# Patient Record
Sex: Female | Born: 1940 | Race: White | Hispanic: No | Marital: Married | Smoking: Never smoker
Health system: Southern US, Community
[De-identification: ages and names within clinical notes are randomized; demographics above are authoritative.]

## PROBLEM LIST (undated history)

## (undated) ENCOUNTER — Ambulatory Visit (INDEPENDENT_AMBULATORY_CARE_PROVIDER_SITE_OTHER): Admission: RE | Payer: Self-pay

## (undated) DIAGNOSIS — G629 Polyneuropathy, unspecified: Secondary | ICD-10-CM

## (undated) DIAGNOSIS — L309 Dermatitis, unspecified: Secondary | ICD-10-CM

## (undated) DIAGNOSIS — M4802 Spinal stenosis, cervical region: Secondary | ICD-10-CM

## (undated) DIAGNOSIS — E039 Hypothyroidism, unspecified: Secondary | ICD-10-CM

## (undated) DIAGNOSIS — H269 Unspecified cataract: Secondary | ICD-10-CM

## (undated) DIAGNOSIS — I10 Essential (primary) hypertension: Secondary | ICD-10-CM

## (undated) DIAGNOSIS — R011 Cardiac murmur, unspecified: Secondary | ICD-10-CM

## (undated) DIAGNOSIS — T148XXA Other injury of unspecified body region, initial encounter: Secondary | ICD-10-CM

## (undated) DIAGNOSIS — E785 Hyperlipidemia, unspecified: Secondary | ICD-10-CM

## (undated) DIAGNOSIS — M1990 Unspecified osteoarthritis, unspecified site: Secondary | ICD-10-CM

## (undated) DIAGNOSIS — IMO0002 Reserved for concepts with insufficient information to code with codable children: Secondary | ICD-10-CM

## (undated) DIAGNOSIS — F419 Anxiety disorder, unspecified: Secondary | ICD-10-CM

## (undated) DIAGNOSIS — R519 Headache, unspecified: Secondary | ICD-10-CM

## (undated) DIAGNOSIS — M4316 Spondylolisthesis, lumbar region: Secondary | ICD-10-CM

## (undated) DIAGNOSIS — D649 Anemia, unspecified: Secondary | ICD-10-CM

## (undated) HISTORY — DX: Hyperlipidemia, unspecified: E78.5

## (undated) HISTORY — DX: Essential (primary) hypertension: I10

## (undated) HISTORY — DX: Anxiety disorder, unspecified: F41.9

## (undated) HISTORY — DX: Reserved for concepts with insufficient information to code with codable children: IMO0002

## (undated) HISTORY — DX: Unspecified osteoarthritis, unspecified site: M19.90

## (undated) HISTORY — DX: Other injury of unspecified body region, initial encounter: T14.8XXA

## (undated) HISTORY — DX: Spinal stenosis, cervical region: M48.02

## (undated) HISTORY — DX: Unspecified cataract: H26.9

## (undated) HISTORY — PX: SPINE SURGERY: SHX786

## (undated) HISTORY — DX: Headache, unspecified: R51.9

## (undated) HISTORY — DX: Dermatitis, unspecified: L30.9

## (undated) HISTORY — DX: Hypothyroidism, unspecified: E03.9

## (undated) HISTORY — DX: Cardiac murmur, unspecified: R01.1

## (undated) HISTORY — DX: Polyneuropathy, unspecified: G62.9

## (undated) HISTORY — PX: BREAST SURGERY: SHX581

## (undated) HISTORY — PX: REPLACEMENT TOTAL KNEE BILATERAL: SUR1225

## (undated) HISTORY — PX: ABDOMINAL HYSTERECTOMY: SHX81

---

## 2004-02-21 ENCOUNTER — Ambulatory Visit
Admit: 2004-02-21 | Disposition: A | Discharge status: Home / Self Care | Payer: Self-pay | Source: Ambulatory Visit | Admitting: Physical Medicine & Rehabilitation

## 2004-02-24 ENCOUNTER — Ambulatory Visit
Admit: 2004-02-24 | Disposition: A | Discharge status: Home / Self Care | Payer: Self-pay | Source: Ambulatory Visit | Admitting: Physical Medicine & Rehabilitation

## 2010-09-15 LAB — TYPE AND SCREEN
AB Screen Gel: NEGATIVE
ABO Rh: A POS

## 2010-09-19 ENCOUNTER — Ambulatory Visit: Payer: Self-pay

## 2010-09-19 ENCOUNTER — Inpatient Hospital Stay
Admission: RE | Admit: 2010-09-19 | Disposition: A | Discharge status: Home Health | Payer: Self-pay | Source: Ambulatory Visit | Admitting: Orthopaedic Surgery

## 2010-09-19 LAB — CBC
Hematocrit: 33 % — ABNORMAL LOW (ref 37.0–47.0)
Hgb: 11.2 g/dL — ABNORMAL LOW (ref 12.0–16.0)
MCH: 30.4 pg (ref 28.0–32.0)
MCHC: 33.9 g/dL (ref 32.0–36.0)
MCV: 89.7 fL (ref 80.0–100.0)
MPV: 9.7 fL (ref 9.4–12.3)
Platelets: 199 10*3/uL (ref 140–400)
RBC: 3.68 10*6/uL — ABNORMAL LOW (ref 4.20–5.40)
RDW: 13 % (ref 12–15)
WBC: 13.71 10*3/uL — ABNORMAL HIGH (ref 3.50–10.80)

## 2010-09-20 LAB — CBC
Hematocrit: 30.6 % — ABNORMAL LOW (ref 37.0–47.0)
Hgb: 10.2 g/dL — ABNORMAL LOW (ref 12.0–16.0)
MCH: 29.7 pg (ref 28.0–32.0)
MCHC: 33.3 g/dL (ref 32.0–36.0)
MCV: 89 fL (ref 80.0–100.0)
MPV: 10.2 fL (ref 9.4–12.3)
Platelets: 242 10*3/uL (ref 140–400)
RBC: 3.44 10*6/uL — ABNORMAL LOW (ref 4.20–5.40)
RDW: 13 % (ref 12–15)
WBC: 11.68 10*3/uL — ABNORMAL HIGH (ref 3.50–10.80)

## 2010-09-20 LAB — BASIC METABOLIC PANEL
Anion Gap: 12 (ref 5.0–15.0)
BUN: 10 mg/dL (ref 7.0–19.0)
CO2: 20 mEq/L — ABNORMAL LOW (ref 22–29)
Calcium: 8 mg/dL — ABNORMAL LOW (ref 8.5–10.5)
Chloride: 104 mEq/L (ref 98–107)
Creatinine: 0.6 mg/dL (ref 0.6–1.0)
Glucose: 138 mg/dL — ABNORMAL HIGH (ref 70–100)
Potassium: 4.1 mEq/L (ref 3.5–5.1)
Sodium: 136 mEq/L (ref 136–145)

## 2010-09-20 LAB — GFR: EGFR: >60

## 2010-09-20 LAB — HEMOLYSIS INDEX: Hemolysis Index: 6 Index (ref 0–18)

## 2010-09-21 LAB — CBC
Hematocrit: 26.4 % — ABNORMAL LOW (ref 37.0–47.0)
Hgb: 9 g/dL — ABNORMAL LOW (ref 12.0–16.0)
MCH: 30.2 pg (ref 28.0–32.0)
MCHC: 34.1 g/dL (ref 32.0–36.0)
MCV: 88.6 fL (ref 80.0–100.0)
MPV: 9.9 fL (ref 9.4–12.3)
Platelets: 177 10*3/uL (ref 140–400)
RBC: 2.98 10*6/uL — ABNORMAL LOW (ref 4.20–5.40)
RDW: 14 % (ref 12–15)
WBC: 10.26 10*3/uL (ref 3.50–10.80)

## 2010-09-23 LAB — PREPARE RBC

## 2010-09-27 LAB — LAB USE ONLY - HISTORICAL SURGICAL PATHOLOGY

## 2010-09-29 ENCOUNTER — Inpatient Hospital Stay
Admission: EM | Admit: 2010-09-29 | Disposition: A | Discharge status: Rehabilitation Facility | Payer: Self-pay | Source: Emergency Department | Admitting: Surgical Critical Care

## 2010-09-29 LAB — BASIC METABOLIC PANEL
BUN: 13 mg/dL (ref 8–20)
CO2: 25 mEq/L (ref 21–30)
Calcium: 8.7 mg/dL (ref 8.6–10.2)
Chloride: 106 mEq/L (ref 98–107)
Creatinine: 0.5 mg/dL — ABNORMAL LOW (ref 0.6–1.5)
Glucose: 152 mg/dL — ABNORMAL HIGH (ref 70–100)
Potassium: 4.1 mEq/L (ref 3.6–5.0)
Sodium: 137 mEq/L (ref 136–146)

## 2010-09-29 LAB — CBC
Hematocrit: 29.1 % — ABNORMAL LOW (ref 37.0–47.0)
Hgb: 9.5 g/dL — ABNORMAL LOW (ref 12.0–16.0)
MCH: 30.3 pg (ref 28.0–32.0)
MCHC: 32.6 g/dL (ref 32.0–36.0)
MCV: 92.7 fL (ref 80.0–100.0)
MPV: 9.2 fL — ABNORMAL LOW (ref 9.4–12.3)
Platelets: 364 10*3/uL (ref 140–400)
RBC: 3.14 10*6/uL — ABNORMAL LOW (ref 4.20–5.40)
RDW: 16 % — ABNORMAL HIGH (ref 12–15)
WBC: 13.46 10*3/uL — ABNORMAL HIGH (ref 3.50–10.80)

## 2010-09-29 LAB — TYPE AND SCREEN
AB Screen Gel: NEGATIVE
ABO Rh: A POS

## 2010-09-29 LAB — ETHANOL

## 2010-09-29 LAB — PT AND APTT F/C
PT INR: 1 (ref 0.9–1.1)
PT: 13.7 s (ref 12.6–15.0)
PTT: 22 s — ABNORMAL LOW (ref 23–37)

## 2010-09-29 LAB — GFR: EGFR: >60

## 2010-09-30 LAB — CBC
Hematocrit: 28.1 % — ABNORMAL LOW (ref 37.0–47.0)
Hgb: 9 g/dL — ABNORMAL LOW (ref 12.0–16.0)
MCH: 29.9 pg (ref 28.0–32.0)
MCHC: 32 g/dL (ref 32.0–36.0)
MCV: 93.4 fL (ref 80.0–100.0)
MPV: 9.1 fL — ABNORMAL LOW (ref 9.4–12.3)
Platelets: 359 10*3/uL (ref 140–400)
RBC: 3.01 10*6/uL — ABNORMAL LOW (ref 4.20–5.40)
RDW: 16 % — ABNORMAL HIGH (ref 12–15)
WBC: 14.21 10*3/uL — ABNORMAL HIGH (ref 3.50–10.80)

## 2010-09-30 LAB — MAGNESIUM: Magnesium: 1.8 mg/dL (ref 1.6–2.3)

## 2010-09-30 LAB — BASIC METABOLIC PANEL
BUN: 12 mg/dL (ref 8–20)
CO2: 26 mEq/L (ref 21–30)
Calcium: 8 mg/dL — ABNORMAL LOW (ref 8.6–10.2)
Chloride: 109 mEq/L — ABNORMAL HIGH (ref 98–107)
Creatinine: 0.5 mg/dL — ABNORMAL LOW (ref 0.6–1.5)
Glucose: 100 mg/dL (ref 70–100)
Potassium: 3.7 mEq/L (ref 3.6–5.0)
Sodium: 140 mEq/L (ref 136–146)

## 2010-09-30 LAB — GFR: EGFR: >60

## 2010-10-02 LAB — BASIC METABOLIC PANEL
BUN: 9 mg/dL (ref 8–20)
CO2: 24 mEq/L (ref 21–30)
Calcium: 7.2 mg/dL — ABNORMAL LOW (ref 8.6–10.2)
Chloride: 111 mEq/L — ABNORMAL HIGH (ref 98–107)
Creatinine: 0.4 mg/dL — ABNORMAL LOW (ref 0.6–1.5)
Glucose: 90 mg/dL (ref 70–100)
Potassium: 3.1 mEq/L — ABNORMAL LOW (ref 3.6–5.0)
Sodium: 138 mEq/L (ref 136–146)

## 2010-10-02 LAB — CBC
Hematocrit: 24.8 % — ABNORMAL LOW (ref 37.0–47.0)
Hgb: 8.1 g/dL — ABNORMAL LOW (ref 12.0–16.0)
MCH: 30.3 pg (ref 28.0–32.0)
MCHC: 32.7 g/dL (ref 32.0–36.0)
MCV: 92.9 fL (ref 80.0–100.0)
MPV: 9.4 fL (ref 9.4–12.3)
Platelets: 257 10*3/uL (ref 140–400)
RBC: 2.67 10*6/uL — ABNORMAL LOW (ref 4.20–5.40)
RDW: 15 % (ref 12–15)
WBC: 8.38 10*3/uL (ref 3.50–10.80)

## 2010-10-02 LAB — GFR: EGFR: >60

## 2010-10-02 LAB — PHOSPHORUS: Phosphorus: 2.1 mg/dL — ABNORMAL LOW (ref 2.5–4.5)

## 2010-10-02 LAB — MAGNESIUM: Magnesium: 1.8 mg/dL (ref 1.6–2.3)

## 2010-10-03 LAB — BASIC METABOLIC PANEL
BUN: 6 mg/dL — ABNORMAL LOW (ref 8–20)
CO2: 22 mEq/L (ref 21–30)
Calcium: 7.7 mg/dL — ABNORMAL LOW (ref 8.6–10.2)
Chloride: 112 mEq/L — ABNORMAL HIGH (ref 98–107)
Creatinine: 0.4 mg/dL — ABNORMAL LOW (ref 0.6–1.5)
Glucose: 91 mg/dL (ref 70–100)
Potassium: 3.1 mEq/L — ABNORMAL LOW (ref 3.6–5.0)
Sodium: 139 mEq/L (ref 136–146)

## 2010-10-03 LAB — TYPE AND SCREEN
AB Screen Gel: NEGATIVE
ABO Rh: A POS

## 2010-10-03 LAB — GFR: EGFR: >60

## 2010-10-03 LAB — MAGNESIUM: Magnesium: 1.5 mg/dL — ABNORMAL LOW (ref 1.6–2.3)

## 2010-10-05 LAB — CBC
Hematocrit: 25.3 % — ABNORMAL LOW (ref 37.0–47.0)
Hgb: 8.3 g/dL — ABNORMAL LOW (ref 12.0–16.0)
MCH: 29.9 pg (ref 28.0–32.0)
MCHC: 32.8 g/dL (ref 32.0–36.0)
MCV: 91 fL (ref 80.0–100.0)
MPV: 9.3 fL — ABNORMAL LOW (ref 9.4–12.3)
Platelets: 304 10*3/uL (ref 140–400)
RBC: 2.78 10*6/uL — ABNORMAL LOW (ref 4.20–5.40)
RDW: 15 % (ref 12–15)
WBC: 8.74 10*3/uL (ref 3.50–10.80)

## 2010-10-05 LAB — GLUCOSE WHOLE BLOOD: Whole Blood Glucose: 113 mg/dL — ABNORMAL HIGH (ref 70–100)

## 2010-10-05 LAB — TOTAL HEMOGLOBIN GROUP
Hematocrit Total Calculated: 27.6 % — ABNORMAL LOW (ref 37.0–47.0)
Hemoglobin Total: 8.9 g/dL — ABNORMAL LOW (ref 12.0–16.0)

## 2010-10-05 LAB — ABG WITH NA/K/CA IONIZED
Arterial Total CO2: 22.8 mEq/L — ABNORMAL LOW (ref 24.0–30.0)
Base Excess, Arterial: -2.2 mEq/L — ABNORMAL LOW (ref <=2.0)
Calcium, Ionized: 2.41 mEq/L (ref 2.30–2.58)
HCO3, Arterial: 21.7 mEq/L — ABNORMAL LOW (ref 23.0–29.0)
O2 Sat, Arterial: 99.6 % (ref 95.0–100.0)
Temperature: 37
Whole Blood Potassium: 3.5 mEq/L (ref 3.5–5.3)
Whole Blood Sodium: 139 mEq/L (ref 136–146)
pCO2, Arterial: 36 mmHg (ref 35.0–45.0)
pH, Arterial: 7.398 (ref 7.350–7.450)
pO2, Arterial: 171 mmHg — ABNORMAL HIGH (ref 80.0–90.0)

## 2010-10-05 LAB — BASIC METABOLIC PANEL
BUN: 8 mg/dL (ref 8–20)
CO2: 23 mEq/L (ref 21–30)
Calcium: 7.7 mg/dL — ABNORMAL LOW (ref 8.6–10.2)
Chloride: 108 mEq/L — ABNORMAL HIGH (ref 98–107)
Creatinine: 0.4 mg/dL — ABNORMAL LOW (ref 0.6–1.5)
Glucose: 87 mg/dL (ref 70–100)
Potassium: 3.6 mEq/L (ref 3.6–5.0)
Sodium: 136 mEq/L (ref 136–146)

## 2010-10-05 LAB — CHLORIDE WHOLE BLOOD: Chloride, WB: 109 mEq/L — ABNORMAL HIGH (ref 98–106)

## 2010-10-05 LAB — MAGNESIUM: Magnesium: 1.7 mg/dL (ref 1.6–2.3)

## 2010-10-05 LAB — GFR: EGFR: >60

## 2010-10-06 LAB — CHLORIDE WHOLE BLOOD: Chloride, WB: 108 mEq/L — ABNORMAL HIGH (ref 98–106)

## 2010-10-06 LAB — CBC
Hematocrit: 24.6 % — ABNORMAL LOW (ref 37.0–47.0)
Hgb: 7.8 g/dL — ABNORMAL LOW (ref 12.0–16.0)
MCH: 29.3 pg (ref 28.0–32.0)
MCHC: 31.7 g/dL — ABNORMAL LOW (ref 32.0–36.0)
MCV: 92.5 fL (ref 80.0–100.0)
MPV: 9.6 fL (ref 9.4–12.3)
Platelets: 283 10*3/uL (ref 140–400)
RBC: 2.66 10*6/uL — ABNORMAL LOW (ref 4.20–5.40)
RDW: 16 % — ABNORMAL HIGH (ref 12–15)
WBC: 8.52 10*3/uL (ref 3.50–10.80)

## 2010-10-06 LAB — BLOOD GAS, ARTERIAL
Arterial Total CO2: 26.4 mEq/L (ref 24.0–30.0)
Base Excess, Arterial: 1.8 mEq/L (ref <=2.0)
FIO2: 40 %
HCO3, Arterial: 25.2 mEq/L (ref 23.0–29.0)
O2 Sat, Arterial: >100 % (ref 95.0–100.0)
Temperature: 37
pCO2, Arterial: 35.4 mmHg (ref 35.0–45.0)
pH, Arterial: 7.464 — ABNORMAL HIGH (ref 7.350–7.450)
pO2, Arterial: 190 mmHg — ABNORMAL HIGH (ref 80.0–90.0)

## 2010-10-06 LAB — BASIC METABOLIC PANEL
BUN: 11 mg/dL (ref 8–20)
CO2: 28 mEq/L (ref 21–30)
Calcium: 8 mg/dL — ABNORMAL LOW (ref 8.6–10.2)
Chloride: 107 mEq/L (ref 98–107)
Creatinine: 0.5 mg/dL — ABNORMAL LOW (ref 0.6–1.5)
Glucose: 96 mg/dL (ref 70–100)
Potassium: 3.6 mEq/L (ref 3.6–5.0)
Sodium: 137 mEq/L (ref 136–146)

## 2010-10-06 LAB — MAGNESIUM: Magnesium: 1.9 mg/dL (ref 1.6–2.3)

## 2010-10-06 LAB — PHOSPHORUS: Phosphorus: 2.4 mg/dL — ABNORMAL LOW (ref 2.5–4.5)

## 2010-10-07 LAB — CBC
Hematocrit: 25.3 % — ABNORMAL LOW (ref 37.0–47.0)
Hgb: 8 g/dL — ABNORMAL LOW (ref 12.0–16.0)
MCH: 29.7 pg (ref 28.0–32.0)
MCHC: 31.6 g/dL — ABNORMAL LOW (ref 32.0–36.0)
MCV: 94.1 fL (ref 80.0–100.0)
MPV: 9.5 fL (ref 9.4–12.3)
Platelets: 275 10*3/uL (ref 140–400)
RBC: 2.69 10*6/uL — ABNORMAL LOW (ref 4.20–5.40)
RDW: 16 % — ABNORMAL HIGH (ref 12–15)
WBC: 8.64 10*3/uL (ref 3.50–10.80)

## 2010-10-07 LAB — RED BLOOD CELLS OR HOLD

## 2010-10-07 LAB — MAGNESIUM: Magnesium: 1.7 mg/dL (ref 1.6–2.3)

## 2010-10-07 LAB — BASIC METABOLIC PANEL
BUN: 11 mg/dL (ref 8–20)
CO2: 29 mEq/L (ref 21–30)
Calcium: 7.8 mg/dL — ABNORMAL LOW (ref 8.6–10.2)
Chloride: 106 mEq/L (ref 98–107)
Creatinine: 0.4 mg/dL — ABNORMAL LOW (ref 0.6–1.5)
Glucose: 95 mg/dL (ref 70–100)
Potassium: 3.7 mEq/L (ref 3.6–5.0)
Sodium: 136 mEq/L (ref 136–146)

## 2010-10-07 LAB — PHOSPHORUS: Phosphorus: 2.9 mg/dL (ref 2.5–4.5)

## 2010-10-08 LAB — BASIC METABOLIC PANEL
BUN: 12 mg/dL (ref 8–20)
CO2: 32 mEq/L — ABNORMAL HIGH (ref 21–30)
Calcium: 8 mg/dL — ABNORMAL LOW (ref 8.6–10.2)
Chloride: 102 mEq/L (ref 98–107)
Creatinine: 0.5 mg/dL — ABNORMAL LOW (ref 0.6–1.5)
Glucose: 118 mg/dL — ABNORMAL HIGH (ref 70–100)
Potassium: 3.5 mEq/L — ABNORMAL LOW (ref 3.6–5.0)
Sodium: 134 mEq/L — ABNORMAL LOW (ref 136–146)

## 2010-10-08 LAB — GFR
EGFR: >60
EGFR: >60
EGFR: >60

## 2010-10-08 LAB — PREALBUMIN: Prealbumin: 9.7 mg/dL — ABNORMAL LOW (ref 19.9–41.9)

## 2010-10-08 LAB — LAB USE ONLY - HISTORICAL SURGICAL PATHOLOGY

## 2010-10-10 LAB — URINALYSIS, REFLEX TO MICROSCOPIC EXAM IF INDICATED
Bilirubin, UA: NEGATIVE
Glucose, UA: NEGATIVE
Ketones UA: NEGATIVE
Nitrite, UA: POSITIVE — AB
Protein, UR: 300 — AB
Specific Gravity UA POCT: 1.01 (ref 1.001–1.035)
Urine pH: 8 (ref 5.0–8.0)
Urobilinogen, UA: NORMAL mg/dL

## 2010-10-10 LAB — GFR: EGFR: >60

## 2010-10-10 LAB — BASIC METABOLIC PANEL
BUN: 16 mg/dL (ref 8–20)
CO2: 31 mEq/L — ABNORMAL HIGH (ref 21–30)
Calcium: 9.2 mg/dL (ref 8.6–10.2)
Chloride: 100 mEq/L (ref 98–107)
Creatinine: 0.5 mg/dL — ABNORMAL LOW (ref 0.6–1.5)
Glucose: 103 mg/dL — ABNORMAL HIGH (ref 70–100)
Potassium: 4.5 mEq/L (ref 3.6–5.0)
Sodium: 137 mEq/L (ref 136–146)

## 2010-10-10 LAB — CBC
Hematocrit: 30.9 % — ABNORMAL LOW (ref 37.0–47.0)
Hgb: 9.5 g/dL — ABNORMAL LOW (ref 12.0–16.0)
MCH: 28.5 pg (ref 28.0–32.0)
MCHC: 30.7 g/dL — ABNORMAL LOW (ref 32.0–36.0)
MCV: 92.8 fL (ref 80.0–100.0)
MPV: 9.8 fL (ref 9.4–12.3)
Platelets: 358 10*3/uL (ref 140–400)
RBC: 3.33 10*6/uL — ABNORMAL LOW (ref 4.20–5.40)
RDW: 15 % (ref 12–15)
WBC: 10.65 10*3/uL (ref 3.50–10.80)

## 2010-10-10 LAB — PHOSPHORUS: Phosphorus: 3.3 mg/dL (ref 2.5–4.5)

## 2010-10-10 LAB — MAGNESIUM: Magnesium: 1.8 mg/dL (ref 1.6–2.3)

## 2010-10-11 ENCOUNTER — Inpatient Hospital Stay
Admission: RE | Admit: 2010-10-11 | Disposition: A | Discharge status: Home / Self Care | Payer: Self-pay | Source: Other Acute Inpatient Hospital | Admitting: Physical Medicine & Rehabilitation

## 2010-10-11 LAB — URINALYSIS WITH MICROSCOPIC
Bilirubin, UA: NEGATIVE
Blood, UA: NEGATIVE
Glucose, UA: NEGATIVE
Ketones UA: NEGATIVE
Nitrite, UA: NEGATIVE
Protein, UR: NEGATIVE
Specific Gravity UA POCT: 1.01 (ref 1.001–1.035)
Urine pH: 8 (ref 5.0–8.0)
Urobilinogen, UA: NORMAL mg/dL

## 2010-10-12 LAB — CBC
Hematocrit: 26.1 % — ABNORMAL LOW (ref 37.0–47.0)
Hgb: 8.3 g/dL — ABNORMAL LOW (ref 12.0–16.0)
MCH: 29.4 pg (ref 28.0–32.0)
MCHC: 31.8 g/dL — ABNORMAL LOW (ref 32.0–36.0)
MCV: 92.6 fL (ref 80.0–100.0)
MPV: 9.2 fL — ABNORMAL LOW (ref 9.4–12.3)
Platelets: 294 10*3/uL (ref 140–400)
RBC: 2.82 10*6/uL — ABNORMAL LOW (ref 4.20–5.40)
RDW: 15 % (ref 12–15)
WBC: 7.48 10*3/uL (ref 3.50–10.80)

## 2010-10-12 LAB — COMPREHENSIVE METABOLIC PANEL
ALT: 25 U/L (ref 21–72)
AST (SGOT): 25 U/L (ref 8–39)
Albumin/Globulin Ratio: 0.9 — ABNORMAL LOW (ref 1.1–1.8)
Albumin: 2.5 g/dL — ABNORMAL LOW (ref 3.7–5.1)
Alkaline Phosphatase: 69 U/L (ref 43–122)
BUN: 12 mg/dL (ref 7–21)
Bilirubin, Total: 0.6 mg/dL (ref 0.2–1.3)
CO2: 27 mEq/L (ref 22–31)
Calcium: 8.2 mg/dL — ABNORMAL LOW (ref 8.6–10.2)
Chloride: 107 mEq/L (ref 98–107)
Creatinine: 0.5 mg/dL (ref 0.5–1.4)
Globulin: 2.8 g/dL (ref 2.0–3.7)
Glucose: 92 mg/dL (ref 70–100)
Potassium: 3.5 mEq/L — ABNORMAL LOW (ref 3.6–5.0)
Protein, Total: 5.3 g/dL — ABNORMAL LOW (ref 6.0–8.0)
Sodium: 138 mEq/L (ref 136–143)

## 2010-10-12 LAB — GFR: EGFR: >60

## 2010-10-15 LAB — GFR: EGFR: >60

## 2010-10-15 LAB — CBC
Hematocrit: 30.7 % — ABNORMAL LOW (ref 37.0–47.0)
Hgb: 9.6 g/dL — ABNORMAL LOW (ref 12.0–16.0)
MCH: 29.2 pg (ref 28.0–32.0)
MCHC: 31.3 g/dL — ABNORMAL LOW (ref 32.0–36.0)
MCV: 93.3 fL (ref 80.0–100.0)
MPV: 9.5 fL (ref 9.4–12.3)
Platelets: 329 10*3/uL (ref 140–400)
RBC: 3.29 10*6/uL — ABNORMAL LOW (ref 4.20–5.40)
RDW: 15 % (ref 12–15)
WBC: 8.84 10*3/uL (ref 3.50–10.80)

## 2010-10-15 LAB — BASIC METABOLIC PANEL
BUN: 12 mg/dL (ref 7–21)
CO2: 30 mEq/L (ref 22–31)
Calcium: 8.8 mg/dL (ref 8.6–10.2)
Chloride: 102 mEq/L (ref 98–107)
Creatinine: 0.6 mg/dL (ref 0.5–1.4)
Glucose: 91 mg/dL (ref 70–100)
Potassium: 3.3 mEq/L — ABNORMAL LOW (ref 3.6–5.0)
Sodium: 138 mEq/L (ref 136–143)

## 2010-10-20 LAB — URINALYSIS WITH MICROSCOPIC
Bilirubin, UA: NEGATIVE
Glucose, UA: NEGATIVE
Ketones UA: NEGATIVE
Nitrite, UA: NEGATIVE
Protein, UR: NEGATIVE
Specific Gravity UA POCT: 1.005 (ref 1.001–1.035)
Urine pH: 6 (ref 5.0–8.0)
Urobilinogen, UA: NORMAL mg/dL

## 2010-10-22 LAB — CBC
Hematocrit: 32.5 % — ABNORMAL LOW (ref 37.0–47.0)
Hgb: 10.2 g/dL — ABNORMAL LOW (ref 12.0–16.0)
MCH: 28.3 pg (ref 28.0–32.0)
MCHC: 31.4 g/dL — ABNORMAL LOW (ref 32.0–36.0)
MCV: 90.3 fL (ref 80.0–100.0)
MPV: 10 fL (ref 9.4–12.3)
Platelets: 317 10*3/uL (ref 140–400)
RBC: 3.6 10*6/uL — ABNORMAL LOW (ref 4.20–5.40)
RDW: 15 % (ref 12–15)
WBC: 6.59 10*3/uL (ref 3.50–10.80)

## 2010-10-22 LAB — BASIC METABOLIC PANEL
BUN: 11 mg/dL (ref 7–21)
CO2: 31 mEq/L (ref 22–31)
Calcium: 9.1 mg/dL (ref 8.6–10.2)
Chloride: 101 mEq/L (ref 98–107)
Creatinine: 0.6 mg/dL (ref 0.5–1.4)
Glucose: 99 mg/dL (ref 70–100)
Potassium: 3 mEq/L — ABNORMAL LOW (ref 3.6–5.0)
Sodium: 138 mEq/L (ref 136–143)

## 2010-10-22 LAB — GFR: EGFR: >60

## 2010-10-24 LAB — BASIC METABOLIC PANEL
BUN: 12 mg/dL (ref 7–21)
CO2: 27 mEq/L (ref 22–31)
Calcium: 9.3 mg/dL (ref 8.6–10.2)
Chloride: 104 mEq/L (ref 98–107)
Creatinine: 0.7 mg/dL (ref 0.5–1.4)
Glucose: 88 mg/dL (ref 70–100)
Potassium: 4.4 mEq/L (ref 3.6–5.0)
Sodium: 137 mEq/L (ref 136–143)

## 2010-10-24 LAB — GFR: EGFR: >60

## 2010-10-28 LAB — URINALYSIS, REFLEX TO MICROSCOPIC EXAM IF INDICATED
Bilirubin, UA: NEGATIVE
Blood, UA: NEGATIVE
Glucose, UA: NEGATIVE
Ketones UA: NEGATIVE
Nitrite, UA: NEGATIVE
Protein, UR: NEGATIVE
Specific Gravity UA POCT: 1.005 (ref 1.001–1.035)
Urine pH: 6 (ref 5.0–8.0)
Urobilinogen, UA: NORMAL mg/dL

## 2010-10-29 LAB — BASIC METABOLIC PANEL
BUN: 11 mg/dL (ref 7–21)
CO2: 26 mEq/L (ref 22–31)
Calcium: 8.6 mg/dL (ref 8.6–10.2)
Chloride: 99 mEq/L (ref 98–107)
Creatinine: 0.6 mg/dL (ref 0.5–1.4)
Glucose: 108 mg/dL — ABNORMAL HIGH (ref 70–100)
Potassium: 3 mEq/L — ABNORMAL LOW (ref 3.6–5.0)
Sodium: 131 mEq/L — ABNORMAL LOW (ref 136–143)

## 2010-10-29 LAB — CBC
Hematocrit: 33.3 % — ABNORMAL LOW (ref 37.0–47.0)
Hgb: 10.8 g/dL — ABNORMAL LOW (ref 12.0–16.0)
MCH: 28.2 pg (ref 28.0–32.0)
MCHC: 32.4 g/dL (ref 32.0–36.0)
MCV: 86.9 fL (ref 80.0–100.0)
MPV: 10.3 fL (ref 9.4–12.3)
Platelets: 152 10*3/uL (ref 140–400)
RBC: 3.83 10*6/uL — ABNORMAL LOW (ref 4.20–5.40)
RDW: 15 % (ref 12–15)
WBC: 5.28 10*3/uL (ref 3.50–10.80)

## 2010-10-29 LAB — GFR: EGFR: >60

## 2010-11-01 LAB — BASIC METABOLIC PANEL
BUN: 8 mg/dL (ref 7–21)
CO2: 25 mEq/L (ref 22–31)
Calcium: 8.9 mg/dL (ref 8.6–10.2)
Chloride: 107 mEq/L (ref 98–107)
Creatinine: 0.4 mg/dL — ABNORMAL LOW (ref 0.5–1.4)
Glucose: 86 mg/dL (ref 70–100)
Potassium: 3.8 mEq/L (ref 3.6–5.0)
Sodium: 139 mEq/L (ref 136–143)

## 2010-11-01 LAB — GFR: EGFR: >60

## 2010-11-05 LAB — CBC
Hematocrit: 33.4 % — ABNORMAL LOW (ref 37.0–47.0)
Hgb: 10.6 g/dL — ABNORMAL LOW (ref 12.0–16.0)
MCH: 27.8 pg — ABNORMAL LOW (ref 28.0–32.0)
MCHC: 31.7 g/dL — ABNORMAL LOW (ref 32.0–36.0)
MCV: 87.7 fL (ref 80.0–100.0)
MPV: 9.7 fL (ref 9.4–12.3)
Platelets: 257 10*3/uL (ref 140–400)
RBC: 3.81 10*6/uL — ABNORMAL LOW (ref 4.20–5.40)
RDW: 15 % (ref 12–15)
WBC: 5.34 10*3/uL (ref 3.50–10.80)

## 2010-11-05 LAB — GFR: EGFR: >60

## 2010-11-05 LAB — BASIC METABOLIC PANEL
BUN: 8 mg/dL (ref 7–21)
CO2: 28 mEq/L (ref 22–31)
Calcium: 9.2 mg/dL (ref 8.6–10.2)
Chloride: 104 mEq/L (ref 98–107)
Creatinine: 0.5 mg/dL (ref 0.5–1.4)
Glucose: 100 mg/dL (ref 70–100)
Potassium: 3.4 mEq/L — ABNORMAL LOW (ref 3.6–5.0)
Sodium: 137 mEq/L (ref 136–143)

## 2010-11-07 ENCOUNTER — Ambulatory Visit (INDEPENDENT_AMBULATORY_CARE_PROVIDER_SITE_OTHER)
Admit: 2010-11-07 | Disposition: A | Discharge status: Home / Self Care | Payer: Self-pay | Source: Ambulatory Visit | Admitting: Physical Medicine & Rehabilitation

## 2010-11-10 LAB — CBC
Hematocrit: 37.6 % (ref 37.0–47.0)
Hgb: 12.2 g/dL (ref 12.0–16.0)
MCH: 28 pg (ref 28.0–32.0)
MCHC: 32.4 g/dL (ref 32.0–36.0)
MCV: 86.4 fL (ref 80.0–100.0)
MPV: 10.1 fL (ref 9.4–12.3)
Platelets: 343 10*3/uL (ref 140–400)
RBC: 4.35 10*6/uL (ref 4.20–5.40)
RDW: 15 % (ref 12–15)
WBC: 8.19 10*3/uL (ref 3.50–10.80)

## 2010-11-10 LAB — BASIC METABOLIC PANEL
BUN: 8 mg/dL (ref 7–21)
CO2: 30 mEq/L (ref 22–31)
Calcium: 9.7 mg/dL (ref 8.6–10.2)
Chloride: 100 mEq/L (ref 98–107)
Creatinine: 0.6 mg/dL (ref 0.5–1.4)
Glucose: 123 mg/dL — ABNORMAL HIGH (ref 70–100)
Potassium: 3.3 mEq/L — ABNORMAL LOW (ref 3.6–5.0)
Sodium: 138 mEq/L (ref 136–143)

## 2010-11-10 LAB — GFR: EGFR: >60

## 2010-11-11 LAB — CBC
Hematocrit: 34.6 % — ABNORMAL LOW (ref 37.0–47.0)
Hgb: 11.1 g/dL — ABNORMAL LOW (ref 12.0–16.0)
MCH: 27.8 pg — ABNORMAL LOW (ref 28.0–32.0)
MCHC: 32.1 g/dL (ref 32.0–36.0)
MCV: 86.5 fL (ref 80.0–100.0)
MPV: 10 fL (ref 9.4–12.3)
Platelets: 296 10*3/uL (ref 140–400)
RBC: 4 10*6/uL — ABNORMAL LOW (ref 4.20–5.40)
RDW: 15 % (ref 12–15)
WBC: 5.48 10*3/uL (ref 3.50–10.80)

## 2010-11-11 LAB — BASIC METABOLIC PANEL
BUN: 8 mg/dL (ref 7–21)
CO2: 29 mEq/L (ref 22–31)
Calcium: 9.5 mg/dL (ref 8.6–10.2)
Chloride: 102 mEq/L (ref 98–107)
Creatinine: 0.5 mg/dL (ref 0.5–1.4)
Glucose: 97 mg/dL (ref 70–100)
Potassium: 4.2 mEq/L (ref 3.6–5.0)
Sodium: 137 mEq/L (ref 136–143)

## 2010-11-11 LAB — GFR: EGFR: >60

## 2010-11-11 LAB — TSH: TSH: 6.51 u[IU]/mL — ABNORMAL HIGH (ref 0.465–4.680)

## 2010-11-12 LAB — BASIC METABOLIC PANEL
BUN: 13 mg/dL (ref 7–21)
CO2: 27 mEq/L (ref 22–31)
Calcium: 9.3 mg/dL (ref 8.6–10.2)
Chloride: 104 mEq/L (ref 98–107)
Creatinine: 0.5 mg/dL (ref 0.5–1.4)
Glucose: 108 mg/dL — ABNORMAL HIGH (ref 70–100)
Potassium: 3.9 mEq/L (ref 3.6–5.0)
Sodium: 136 mEq/L (ref 136–143)

## 2010-11-12 LAB — CBC
Hematocrit: 34.2 % — ABNORMAL LOW (ref 37.0–47.0)
Hgb: 10.8 g/dL — ABNORMAL LOW (ref 12.0–16.0)
MCH: 27.4 pg — ABNORMAL LOW (ref 28.0–32.0)
MCHC: 31.6 g/dL — ABNORMAL LOW (ref 32.0–36.0)
MCV: 86.8 fL (ref 80.0–100.0)
MPV: 9.7 fL (ref 9.4–12.3)
Platelets: 291 10*3/uL (ref 140–400)
RBC: 3.94 10*6/uL — ABNORMAL LOW (ref 4.20–5.40)
RDW: 15 % (ref 12–15)
WBC: 6.64 10*3/uL (ref 3.50–10.80)

## 2010-11-12 LAB — TSH: TSH: 4.06 u[IU]/mL (ref 0.465–4.680)

## 2010-11-12 LAB — GFR: EGFR: >60

## 2010-11-13 LAB — BASIC METABOLIC PANEL
BUN: 9 mg/dL (ref 7–21)
CO2: 29 mEq/L (ref 22–31)
Calcium: 9.4 mg/dL (ref 8.6–10.2)
Chloride: 104 mEq/L (ref 98–107)
Creatinine: 0.5 mg/dL (ref 0.5–1.4)
Glucose: 99 mg/dL (ref 70–100)
Potassium: 4.1 mEq/L (ref 3.6–5.0)
Sodium: 138 mEq/L (ref 136–143)

## 2010-11-13 LAB — CBC
Hematocrit: 34.9 % — ABNORMAL LOW (ref 37.0–47.0)
Hgb: 11 g/dL — ABNORMAL LOW (ref 12.0–16.0)
MCH: 27.4 pg — ABNORMAL LOW (ref 28.0–32.0)
MCHC: 31.5 g/dL — ABNORMAL LOW (ref 32.0–36.0)
MCV: 86.8 fL (ref 80.0–100.0)
MPV: 10 fL (ref 9.4–12.3)
Platelets: 300 10*3/uL (ref 140–400)
RBC: 4.02 10*6/uL — ABNORMAL LOW (ref 4.20–5.40)
RDW: 15 % (ref 12–15)
WBC: 4.98 10*3/uL (ref 3.50–10.80)

## 2010-11-13 LAB — GFR: EGFR: >60

## 2010-11-14 LAB — GFR: EGFR: >60

## 2010-11-14 LAB — BASIC METABOLIC PANEL
BUN: 7 mg/dL (ref 7–21)
CO2: 24 mEq/L (ref 22–31)
Calcium: 9.4 mg/dL (ref 8.6–10.2)
Chloride: 106 mEq/L (ref 98–107)
Creatinine: 0.5 mg/dL (ref 0.5–1.4)
Glucose: 100 mg/dL (ref 70–100)
Potassium: 4.2 mEq/L (ref 3.6–5.0)
Sodium: 138 mEq/L (ref 136–143)

## 2010-12-05 ENCOUNTER — Ambulatory Visit (INDEPENDENT_AMBULATORY_CARE_PROVIDER_SITE_OTHER)
Admit: 2010-12-05 | Disposition: A | Discharge status: Home / Self Care | Payer: Self-pay | Source: Ambulatory Visit | Admitting: Physical Medicine & Rehabilitation

## 2011-02-06 ENCOUNTER — Ambulatory Visit (INDEPENDENT_AMBULATORY_CARE_PROVIDER_SITE_OTHER): Admit: 2011-02-06 | Disposition: A | Discharge status: Home / Self Care | Payer: Self-pay | Source: Ambulatory Visit

## 2011-02-13 ENCOUNTER — Ambulatory Visit
Admit: 2011-02-13 | Discharge: 2011-02-13 | Disposition: A | Discharge status: Home / Self Care | Payer: Self-pay | Source: Ambulatory Visit | Attending: Physical Medicine & Rehabilitation | Admitting: Physical Medicine & Rehabilitation

## 2011-05-14 ENCOUNTER — Ambulatory Visit
Admit: 2011-05-14 | Discharge: 2011-05-14 | Disposition: A | Discharge status: Home / Self Care | Payer: Self-pay | Source: Ambulatory Visit

## 2011-06-05 ENCOUNTER — Ambulatory Visit (INDEPENDENT_AMBULATORY_CARE_PROVIDER_SITE_OTHER)
Admit: 2011-06-05 | Discharge: 2011-06-05 | Disposition: A | Discharge status: Home / Self Care | Payer: Self-pay | Source: Ambulatory Visit

## 2011-07-11 LAB — ECG 12-LEAD
Atrial Rate: 70 {beats}/min
P Axis: 58 degrees
P-R Interval: 158 ms
Q-T Interval: 382 ms
QRS Duration: 82 ms
QTC Calculation (Bezet): 412 ms
R Axis: 35 degrees
T Axis: 12 degrees
Ventricular Rate: 70 {beats}/min

## 2011-08-09 NOTE — Op Note (Signed)
Darlene Landry, Darlene Landry      MRN:          [id number]      Account:      [id number]      Document ID:  [id number]      Procedure Date: 09/19/2010            Admit Date: 09/19/2010            Patient Location: [facility]20      Patient Type: I            SURGEON: Baker Janus MD            PREOPERATIVE DIAGNOSIS:      Severe degenerative arthritis, right knee.            POSTOPERATIVE DIAGNOSIS:      Severe degenerative arthritis right knee, 15 degrees genu valgus alignment      and 10-degree flexion contracture.            TITLE OF PROCEDURE:      Right total knee replacement            ASSISTANT:      Leanord Hawking, RNFA.            SECOND ASSISTANT:      Lenise Arena, RNFA.            COMPONENTS:      DePuy PFC Sigma size 3 right cruciate-retaining Porocoat femoral component      press-fit;  35-mm oval dome patellar 3-peg polyethylene, cemented;  size 2      modular fixed bearing cobalt chrome PFC Sigma tibial tray cemented;  10-mm      curved cross length tibial polyethylene insert.            ANESTHESIA:      Combined spinal and epidural with catheter            PREPARATION:      Alcohol and DuraPrep.            DESCRIPTION OF PROCEDURE:      After the satisfactory accomplishment of combined spinal and epidural with      catheter anesthesia, the patient was positioned supine and her right lower      extremity prepped and draped free in the usual sterile manner.  An Esmarch      wrap was tightly applied from toes to above the knee and the tourniquet at      the thigh inflated to 300 mmHg pressure.  A straight anterior longitudinal      midline incision was utilized with dissection carried sharply through the      skin and subcutaneous layers between the fibers of vastus medialis obliquus                                   Page 1 of 3      Darlene, Landry      MRN:          [id number]      Account:      [id number]      Document ID:  [id number]      Procedure Date: 09/19/2010            in the mid vastus zone and then  carried distally along the medial border of      the patella and patellar tendon.  The patella was dislocated  laterally but      not everted.  Thickness of the patella measured 21.5 mm.  Inspection of the      knee revealed severe tricompartmental degenerative arthritis.  There was      depression of the lateral tibial plateau and severe wear of the lateral      femoral condyle.  Hypertrophic osteophytic bone around the margins of the      femur and tibia was excised with rongeurs and osteotomes.  The anterior      cruciate ligament was quite atrophic its remnant was excised.  A femoral      notchplasty was performed.  A pilot hole was made in the trochlear region      of the femur to allow for insertion of an intramedullary alignment rod with      a 5-degree valgus distal femoral cutting guide.  Resections were made from      the distal femur medially and laterally, adequate in part to relieve the      degree flexion contracture.  Sizing of the femur then indicated that a size      3 component was ideal and a size 3 captured cutting guide was applied to      the cut surface of the distal femur to allow for anterior and posterior      condylar cuts and the chamfer cuts as well.            Attention was redirected to the tibia.  Stylus of the extramedullary tibial      guide was placed on the surface of the lateral tibial plateau and set at      zero.  The proximal tibial resection was performed after the alignment was      confirmed as ideal, and thus with a 5-degree posterior slope capture guide,      the proximal tibial resection was performed while the PCL was protected at      all times.  The menisci were resected.  The cut surface of the tibia was      covered perfectly with a size 2 tibial tray.  Trial reduction was performed      with a 10-mm curved tibial composite insert.  This is a fixed bearing      insert.  The knee came to full extension and was perfectly balanced to      varus and valgus stress  throughout flexion and extension.  Flexion and      extension gaps matched.  The thickness of the patella was measured and was      noted at 21.5 mm and the arthritic surface was resected to 14 mm.  The cut      surface of the patella was covered perfectly with a 35-mm oval patellar      template.  The holes for the 3 fixation pegs were drilled and a trial      patellar component applied.  There was lateral patellar tracking.  In      addition, the IT band was tight, creating a gapping of the medial      compartment.  An IT band fenestration was performed with a #15 blade      scalpel and a lateral retinacular release performed with the cautery.  This      completely corrected the imbalance of the IT band and medial ligaments,      restored the alignment to neutral, that is about 5 degrees of valgus and  with the patellar trial in place after the release, patellar tracking was      returned to normal as well.  The femoral lug holes were drilled using the      femoral trial as a template and the tibial metaphysis prepared with reamer      and broach for the stem and keel of the tibial component using the tibial      tray as a template.  Trial components were removed.  The knee was very      thoroughly lavaged with saline with a pulsatile lavage system and at the      same time, methyl methacrylate prepared in the [employer]ryker Revolution cement      mixing system.  At the proper viscosity, methyl methacrylate was applied to                                   Page 2 of 3      Darlene, Landry      MRN:          [id number]      Account:      [id number]      Document ID:  [id number]      Procedure Date: 09/19/2010            the cut surface of the tibia and manually compressed in the interstices of      the tibial surface.  Methyl methacrylate was also applied to the      undersurface of the tibial tray, keeping the stem bare.  The tibial      component was then impacted into place and excess cement expressed and       sharply trimmed from its margin.  The patellar component was cemented in a      similar manner.  At this time, at 57 minutes, the tourniquet was released      and circulation restored to the lower extremity.  The remainder of the      procedure was performed without tourniquet control.  The intramedullary      canal of the femur was plugged with autogenous bone graft prepared from the      distal femoral chamfer cuts.  The knee was brought into extension and      explored for bleeding.  There was no unusual bleeding encountered and thus      minimal use of cautery required.  The geniculate vessels were not bleeding.       After thorough lavage of the knee with the pulsatile system in extension,      the knee was brought into flexion.  The tibial tray was dried and then a      10-mm curved tibial poly insert, which was cross-linked and fixed bearing      was secured to the tibial tray in the usual manner.  Next, the fully      porous-coated size 3 right cruciate-retaining femoral component was      impacted into place with an excellent total contact press fit achieved.      Closure was performed in 120 degrees flexion.  Prior to closure, the      thickness of the patella was measured and recorded at 23 mm.  Closure was      performed in flexion with interrupted #1 Vicryl for the vastus medialis and      medial capsular repair, followed  by 2-0 Monocryl for the superficial      fascial and subcutaneous layers, and then a subcuticular closure for the      skin with 3-0 Monocryl and overlying and then a subcuticular closure for      the skin with 4-0 Monocryl and overlying Steri-Strips.  A standard dressing      was applied incorporating absorbent gauze, a thigh-high TED stocking, and      6-inch Ace wrap loosely applied from above the ankle to well above the      knee.  The patient was aroused from her sedation and transferred to the      postanesthesia care unit in satisfactory condition having tolerated the       procedure well and having received 2 grams of Ancef IV preoperatively.                        Electronic Signing Provider            D:  09/19/2010 15:07 PM by Dr. Baker Janus, MD [id number])      T:  09/19/2010 15:55 PM by [id number]                  cc:                                   Page 3 of 3      Authenticated and Edited by Baker Janus, MD [phone]) On 09/29/10 12:04:29 PM

## 2011-08-09 NOTE — Consults (Signed)
Darlene Landry, Darlene Landry      MRN:          [id number]      Account:      [id number]      Document ID:  [id number]      Service Date: 10/05/2010            Admit Date: 09/29/2010            Patient Location: [facility]04      Patient Type: I            CONSULTING PHYSICIAN: Henry Russel MD            REFERRING PHYSICIAN: Inda Merlin MD                  CONSULTING SERVICE:      Orthopedic surgery.            REASON FOR CONSULTATION:      Right knee pain.            CHIEF COMPLAINT:      Right knee pain.            HISTORY OF PRESENT ILLNESS:      The patient is a 70 year old female who complains of severe right knee pain      status post total knee arthroplasty on September 19, 2008 with Dr. Juliette Alcide.      She was using assistive devices at home.  She fell down some stairs, was      brought in as a trauma.  Orthopedic surgery was consulted after she      complains of severe right knee pain.  She was admitted to the trauma      service.  X-rays were obtained.  Pain has been since the time of injury.      No associated signs or symptoms such as numbness or tingling.  Pain has      been since the time of injury, not getting worse and relieved with      elevation and pain medicine.            PAST MEDICAL HISTORY:      Hyperlipidemia, hypertension, osteoarthritis, hypothyroidism.            PAST SURGICAL HISTORY:      Total knee arthroplasty September 19, 2010, partial hysterectomy In 1995,      breast surgery 1990.            SOCIAL HISTORY:      She lives with her husband, does not smoke, does not does drink, does not      do any drugs.            FAMILY HISTORY:      Reviewed, noncontributory.            REVIEW OF SYSTEMS:                                   Page 1 of 2      KARLE, DESROSIER      MRN:          [id number]      Account:      [id number]      Document ID:  [id number]      Service Date: 10/05/2010            Reviewed.  Please refer to the dictation by Dr. Luan Pulling October 03, 2010.             MEDICATIONS:      Reviewed.  Please refer to the dictation by Dr. Luan Pulling for December      14.            ALLERGIES:      NSAID, IODINE, TENORMAN, CALAN, DIOVAN, AVELOX, and PINEAPPLE.            PHYSICAL EXAMINATION:      VITAL SIGNS:  At the time of evaluation, temperature 99, heart rate 46,      respirations 13, blood pressure 144/56.      EXTREMITIES:  Her right knee indicates she has minimal effusion.  Her      incisions look fine.  No sign of infection, dehiscence from her trauma.      Her Steri-Strips were in place.  She can move her ankles and toes without      difficulty.  She has palpable pulses in her foot and normal sensation in      her foot.  She can dorsiflex and plantarflex her ankle without difficulty.      She has painful range of motion, but again no instability.            ASSESSMENT AND PLAN:      Right knee contusion status post total knee replacement.            RECOMMENDATIONS:      At this point is continue weightbearing as tolerated, range of motion as      tolerated.  Follow up with Dr. Juliette Alcide go upon discharge.            X-rays are negative for any periprosthetic fracture.                        Electronic Signing Provider            D:  10/05/2010 16:29 PM by Dr. Roberts [place]. [place] ([id number])      T:  10/05/2010 22:19 PM by [id number]                  cc:                                   Page 2 of 2      Authenticated by Genia Hotter, MD ([id number]) On 10/19/2010 11:37:12 AM

## 2011-08-09 NOTE — Consults (Signed)
Darlene Landry, Darlene Landry      MRN:          [id number]      Account:      [id number]      Document ID:  [id number]      Service Date: 10/12/2010            Admit Date: 10/11/2010            Patient Location: [facility]534-01      Patient Type: I            CONSULTING PHYSICIAN: Anice Paganini MD            REFERRING PHYSICIAN: Brooke Pace MD                  CONSULTING SERVICE:      Infectious disease.            REASON FOR CONSULTATION:      Dr. Nile Riggs has requested assistance in management of this patient with      Staphylococcal bacteremia, polymicrobial urinary tract infection.            HISTORY OF PRESENT ILLNESS:      This as a 70 year old female with a history of hyperlipidemia,      hypertension, osteoarthritis, hypothyroidism, history of partial      hysterectomy for fibroids, history of breast surgery in 1989 for a blocked      duct.            The patient has a history of degenerative arthritis, underwent a right      total knee replacement on September 19, 2010, at [facility].            She was discharged home, but unfortunately fell and injured her neck and      was admitted to [facility] on September 29, 2010.  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  She underwent anterior diskectomy and fusion from C4 to C5, open      reduction internal fixation of cervical facet as well as C4 to C5 posterior      spinal fusion.  She received perioperative Ancef.            The patient while at [facility] on October 10, 2010, had an      episode of encephalopathy, which was initially thought secondary to      morphine and Ativan.  She did have blood cultures obtained as well as a      urinalysis and urine culture.  She was empirically started on Cefepime when      the urinalysis was grossly abnormal.            The patient was subsequently  transferred to [facility] on October 11, 2010.  Blood cultures on both sets have      grown out after 24 hours a coagulase-negative Staphylococcus.  Infectious      disease has been requested to make further recommendations.                                         Page  1 of 3      Darlene Landry, Darlene Landry      MRN:          [id number]      Account:      [id number]      Document ID:  [id number]      Service Date: 10/12/2010            The patient's history was obtained through the staff, the chart, discussion      with Dr. Nile Riggs, review of [facility] records, as well as      discussion with the patient's husband.            The patient had a Foley catheter at [facility]; however, this      has been discontinued.  She did not have any central lines.  She is hard      stick.            According to the patient's husband, there have been no fevers, rigors, or      chills.  Her mental status has essentially resolved.            REVIEW OF SYSTEMS:      There is no dysuria, rigors, chills, headache, nausea, cough, diarrhea,      vomiting.            PAST MEDICAL AND PAST SURGICAL HISTORY:      As noted above.            ALLERGIES:      She has an allergy listed to AVELOX.            MEDICATIONS:      Current antibiotics include Cefepime.  Vancomycin has been ordered but not      given.            SOCIAL HISTORY:      The patient is married.  She does not smoke.  She does not drink.            FAMILY HISTORY:      Noncontributory.            PHYSICAL EXAMINATION:      VITAL SIGNS:  T-max of 99.2 degrees, current temperature of 97.7, heart      rate 70, respiratory rate of 18, blood pressure 124/76, saturation of 96%.      She is nontoxic and in no obvious distress.      HEENT AND NECK:  The patient has a cervical collar.  Her visible      postoperative surgical site looks benign.  Oral cavity, oropharynx is      otherwise clear.  She had anicteric sclerae.      CHEST:  Clear  to auscultation in all lung fields.      HEART:  Revealed a normal S1, S2, regular rate and rhythm.      ABDOMEN:  Soft and nontender.      EXTREMITIES:  Her right knee incision site is clean, dry, and intact.  No      evidence of infection.  There are Steri-Strips.  They are coapted.      NEUROLOGIC:  She is alert and oriented x3.                                         Page 2 of 3  Darlene Landry, Darlene Landry      MRN:          [id number]      Account:      [id number]      Document ID:  [id number]      Service Date: 10/12/2010            LABORATORY DATA:      White count of 7.48, hematocrit 26, platelets of 294.  BUN is 12,      creatinine 0.5.  Liver enzymes unremarkable.  Chest x-ray is negative.  CAT      scan of cervical spine revealed postoperative changes.  Head CT negative.      Dopplers negative.  Repeat urinalysis negative.  Blood cultures in 2 sets      of coagulase-negative Staphylococcus.  Urine culture:  E. coli and      ampicillin susceptible Enterococcus greater than 100,000.  Prior urinalysis      revealed too numerous to count red cells and white cells.            PICC line placed October 12, 2010.            ASSESSMENT:      1.  The patient is status post cervical fusion after neck injury, etcetera      at [facility].      2.  Positive blood cultures which are likely double contaminants.      3.  Escherichia coli and ampicillin susceptible Enterococcus UTI.            RECOMMENDATIONS:      1.  Will discontinue vancomycin order and discontinue Cefepime.      2.  Will start IV ampicillin with likely transition to oral amoxicillin in      the next 24 to 48 hours once taking oral better.      3.  Further recommendations depend on patient's clinical course.            Thank you for allowing me to participate in the patient's care.                        Electronic Signing Provider            D:  10/13/2010 14:30 PM by Dr. Ladell Heads. Basilia Stuckert, MD [id number])      T:  10/14/2010 15:28 PM by [id number]                   cc:                                   Page 3 of 3      Authenticated and Edited by Ladell Heads. Josearmando Kuhnert, MD [phone]) On 10/15/10 11:44:52 AM

## 2011-08-09 NOTE — Consults (Signed)
Darlene Landry, Darlene Landry      MRN:          [id number]      Account:      [id number]      Document ID:  [id number]      Service Date: 10/03/2010            Admit Date: 09/29/2010            Patient Location: [facility]      Patient Type: I            CONSULTING PHYSICIAN: Leonie Green Landry            REFERRING PHYSICIAN: Inda Merlin Landry                  CONSULTING SERVICE:      Physical Medicine and Rehabilitation.            CHIEF COMPLAINT:      Bilateral upper extremity weakness.            HISTORY OF PRESENT ILLNESS:      The patient is a very pleasant 70 year old female with severe right knee      osteoarthritis who had a right total knee arthroplasty performed on      September 19, 2010.  The patient tolerated the surgical procedure well and      was discharged to home and was participating in her outpatient therapies.      The patient was making significant progress in terms of her mobility and      was able to climb stairs independently with assistive devices at home.  On      September 29, 2010, the patient sustained a fall down the stairs at her      home.  Emergency medical services were activated and the patient was      transported to [facility].  Initial evaluation revealed      multiple injuries to include C4-C5 widening, fracture of right C4      transverse process, grade 1 retrolisthesis of C4 on C5.  Multiple      ligamentous injuries were identified on MRI and as well as a cervical cord      contusion.  The patient was found to have severe bilateral      right-greater-than-left upper extremity weakness and some mild numbness and      tingling in the bilateral upper and lower limbs.  The patient was admitted      to the trauma ICU where she was evaluated by neurosurgery.  She was placed      in an Aspen collar.  She is pending a cervical fusion which is planned for      October 04, 2010.  At the present time, the patient is complaining of 5/10      neck pain, as well as bilateral upper  extremity weakness.  She notes that      she has not had a bowel movement since her injury.  She has no other acute      complaints at the present time.            PAST SURGICAL HISTORY:  Hyperlipidemia, hypertension, osteoarthritis,      hypothyroidism, right total knee arthroplasty, September 19, 2010, partial      hysterectomy in 1995, breast surgery in 1990.            SOCIAL AND FUNCTIONAL HISTORY:      The patient  lives with her husband in [place], [place], in a multi-story                                   Page 1 of 4      Darlene Landry      MRN:          [id number]      Account:      [id number]      Document ID:  [id number]      Service Date: 10/03/2010            home with 4 steps to enter.  First floor setup would be possible.  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  She      has been retired for 6 years.  She denies tobacco, alcohol, or illicit drug      abuse.            FAMILY HISTORY:      Noncontributory.            REVIEW OF SYSTEMS:      A full multisystem review of systems was obtained and all responses were      negative with the exception of those listed above in the history of present      illness.            MEDICATIONS:      1.  Flexeril 10 mg p.o. q.8 h.      2.  Colace 100 mg p.o. q.12 h.      3.  Zetia 10 mg p.o. nightly.      4.  Lasix 20 mg p.o. daily.      5.  Synthroid 50 mcg p.o. daily.      6.  Paxil 10 mg p.o. nightly.      7.  Lyrica 150 mg p.o. q.12 h.      8.  Senokot one tablet p.o. q.12 h.      9.  Zocor 20 mg p.o. nightly.      10.  Tylenol p.r.n.      11.  Intravenous Dilaudid p.r.n.      12.  Morphine p.r.n.      13.  Percocet p.r.n.            ALLERGIES:      NSAIDs, IODINE, TENORMIN, CALAN, DIOVAN, AVELOX, PINEAPPLE.            WEIGHTBEARING STATUS:  There are no nonweightbearing restrictions.            CURRENT FUNCTIONAL STATUS:      The patient is currently on bedrest in an Aspen collar due to her cervical      fractures.   As a result, she is primarily dependent with most of her      mobility and functional activities.            PHYSICAL EXAMINATION:      VITAL SIGNS:  Temperature 99.0, heart 46, respirations 13, blood pressure      144/56.      GENERAL:  The patient is awake, alert, and oriented.  She is in no acute      distress.  She is pleasant and cooperative with examination.      HEENT:  Pupils are equal, round, reactive to light.  Extraocular muscles      are intact.  Oropharynx is clear.  Tongue is midline.  Cervical collar is                                   Page 2 of 4      Landry, GAETA      MRN:          [id number]      Account:      [id number]      Document ID:  [id number]      Service Date: 10/03/2010            in place.      CARDIOVASCULAR:  Regular rate and rhythm, no murmurs, rubs, or gallops.      PULMONARY:  Clear to auscultation; no rales, rhonchi, or wheezes.      ABDOMEN:  Soft, nontender, nondistended with normoactive bowel sounds.      EXTREMITIES:  There is mild bilateral lower extremity edema.  There is no      focal calf tenderness.  The right lower limb is in a CPM machine.      NEUROLOGIC:  Manual muscle testing reveals the following muscle strengths:      Left upper limb:  Elbow flexion 4/5, elbow extension 3/5, wrist extension      3/5, finger flexion 4/5, finger abduction 3/5.  The left lower limb:  Hip      flexion 4/5, knee extension 4/5, dorsiflexion 5/5, EHL 5/5, plantar flexion      5/5.  Right upper limb:  Elbow flexion 3/5, elbow extension 1/5, wrist      extension 1/5, finger flexion 1/5, finger abduction 0/5.  Right lower limb:       Not tested due to recent TKA and CPM machine.  Deep tendon reflexes are      hypoactive in the bilateral lower limbs.  There is negative Hoffman sign      bilaterally.  There is no clonus.  There is intact sensation to light touch      throughout the bilateral upper and lower limbs.            RECENT LABORATORY STUDIES:      On October 02, 2010, white  count 8.38, hemoglobin 8.1, hematocrit 24.8,      platelets 257.  Sodium 138, potassium 3.1, chloride 111, CO2 24, BUN 9,      creatinine 0.4, glucose 90, calcium 7.2, magnesium 1.8, phos 2.1.            RADIOLOGY STUDIES:      1.  CT of the head on September 29, 2010, was negative.      2.  CT angiogram of the chest on September 29, 2010, was negative.      3.  CT of the abdomen and pelvis on September 29, 2010, was negative.      4.  CT of the cervical spine on September 29, 2010, showed fracture through      the right transverse process of C4 with widening of the C4-C5 facet with      retrolisthesis of C4 on C5.      5.  CT of the thoracic spine on September 29, 2010, was negative.      6.  CT of the lumbar spine on September 29, 2010, was negative.      7.  CT angiogram of the neck on October 01, 2010, showed no great vessel  injury.      8.  MRI of the cervical spine on September 29, 2010, showed similar changes      to the CT scan as well as disruption of the ligamentum flavum, fracture of      the C4-C5 disc, disruption of the anterior and posterior longitudinal      ligaments, abnormal cord signal suggestive of contusion.            IMPRESSION:      The patient is a very pleasant 70 year old female with a recent right total      knee arthroplasty who was injured in a fall down stairs on September 29, 2010.  She sustained injuries to include cervical spine fractures.  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                                         Page 3 of 4      TAELER, WINNING      MRN:          [id number]      Account:      [id number]      Document ID:  [id number]      Service Date: 10/03/2010            RECOMMENDATIONS:      1.  The patient is currently in a cervical collar and on spine precautions.      2.  Surgical fusion and fixation is planned for October 04, 2010.      3.  Following medical and surgical  stabilization, given the patient's      premorbid functional level as well as her prognosis for improvement, the      patient will be an excellent candidate for acute inpatient rehabilitation.      4.  Recommend early involvement of physical therapy and occupational      therapy for mobility and functional activities.      5.  Frequent turns and skin checks to prevent pressure sores.      6.  The patient has a Foley catheter for bladder management.  In the future      we will need to discontinue the Foley to evaluate the patient's bladder      function.      7.  If there are no contraindications from the surgical team, I would      consider Lovenox for deep venous thrombosis prophylaxis in this high-risk      patient.      8.  The patient's pain appears to be adequately controlled at the present      time on current medications.      9.  We will continue to follow along with this patient and make      recommendations regarding disposition and management.            Thank you very much for allowing [place] to participate in the care of this      interesting patient.  Please do not hesitate to call with any questions or      concerns.  D:  10/03/2010 13:38 PM by Dr. Leonie Green, Landry ([id number])      T:  10/03/2010 14:24 PM by Jonathon Bellows                  cc:                                   Page 4 of 4      Authenticated by Leonie Green, Landry ([id number]) On 10/04/2010 08:22:40 AM

## 2011-08-09 NOTE — Consults (Signed)
Darlene Landry, Darlene Landry      MRN:          [id number]      Account:      [id number]      Document ID:  [id number]      Service Date: 10/10/2010            Admit Date: 09/29/2010            Patient Location: [facility]      Patient Type: I            CONSULTING PHYSICIAN: Trilby Drummer MD            REFERRING PHYSICIAN:                  REASON FOR CONSULTATION:      This is a consult from trauma team for right-sided weakness.            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  She was admitted on September 29, 2010, and had fallen down 10 stairs.  She had C4 to C5 cord contusion and      cord compression and underwent anterior cervical diskectomy with fusion on      October 04, 2010, with posterior fusion on October 05, 2010.  She had      quadriparesis, right greater than left, with weakness of 1/5 grip strength      on the right, as per the chart.  She was noted to be mildly confused today.       Her husband states that since last night she has been speaking      incoherently.  She thinks she is walking on water.  She has difficulty      moving the right arm and leg and complains of knee pain on the right.  Her      speech was apparently mildly slurred, as per the husband, but has improved      and she is able to articulate well without any facial drooping or any      asymmetry.  She is oriented at this time.  She denies any headaches.  She      has not been on steroids.  She has been started on Lyrica 150 mg twice a      day since admission.  She does have a UTI.            ALLERGIES:      Include NSAIDS, IODINE, TENORMIN, CALAN, DIOVAN, AVELOX.            PAST MEDICAL HISTORY:      Hypertension, hyperlipidemia, osteoporosis, hypothyroidism.            PAST  SURGICAL HISTORY:      Total knee replacement on November 2011, hysterectomy and breast surgery.            DIAGNOSTIC REVIEW:      I reviewed her records including her labs and notes.  Her CBC showed      hematocrit of 30.9.  Her Chem-7 was normal.  Her urinalysis was positive      for UTI.  Her cervical spine films were reviewed and  shows adequate                                   Page 1 of 3      Darlene Landry, Darlene Landry      MRN:          [id number]      Account:      [id number]      Document ID:  [id number]      Service Date: 10/10/2010            decompression and C4-C5, evidence of cord contusion on MRI cervical spine      images.  Her CT brain shows an old small left cerebellar lacune, but no      other acute findings.            SOCIAL HISTORY:      She does not smoke or drink.  She is married.  Her husband was present at      bedside.            FAMILY HISTORY:      Negative for any similar neurologic problems.            REVIEW OF SYSTEMS:      Positive for pain in the right knee.  She denied any fever, night sweats,      chills, sinus condition, hoarseness, visual disturbances, cough, shortness      of breath, chest pain, palpitations, diarrhea, constipation, incontinence,      diarrhea, constipation, muscle pain, muscle cramps, back pain, hives, rash,      bleeding, tremors, problems swallowing, depression, or anxiety.  She does      have incontinence.  All other review of systems negative.            PHYSICAL EXAMINATION:      VITAL SIGNS:  Blood pressure was 140s/60s, heart rate was 80, temperature      was 98.6,  respiratory rate was 20.      GENERAL:  She was in no apparent distress.      HEENT:  There was no pallor, icterus.      EXTREMITIES:  There was no pedal edema.      LUNGS:  Clear to auscultation.      HEART:  Sounds were S1, S2 normal.  There was no carotid bruit.  She did      have right greater than left leg and arm swelling.      NEUROLOGIC:  Revealed minimal dysarthria and right greater than  left      quadriparesis. She was alert and oriented x3.  She knew her name.  She knew      the location.  She knew the year.  She was off by 2 days on the date.  Her      pupils were equal, round, reactive to light.  Her visual fields were full.      Her extraocular movements were intact.  Face was symmetric.  Rest of her      cranial nerve exam was normal.  Strength was at least a 3/5 in the left      upper extremity and left grip strength and 1/5 in the right grip strength      in upper extremity and 2/5 in the right lower extremity and 2+/5 in the      left lower extremity and 4/5 in the left plantar  and ankle dorsiflexor and      2+/5 in the right plantar and ankle dorsiflexor.  Coordination and gait      could not be tested.  Sensory exam revealed diminished pinprick up to the      cervical level.  There was no atrophy or fasciculation.  Reflexes were 1 in      the biceps, brachioradialis, and 2+ in the triceps and 2 in bilateral knees      with upgoing plantars bilaterally.  There was no atrophy or fasciculations.            IMPRESSION AND PLAN:      A 70 year old woman with hypertension, hyperlipidemia,                                   Page 2 of 3      Darlene Landry, Darlene Landry      MRN:          [id number]      Account:      [id number]      Document ID:  [id number]      Service Date: 10/10/2010            hypothyroidism, osteoporosis with recent cord compression from cervical      trauma status post cervical spine surgery with persisting right-sided      weakness and persisting right greater than left-sided weakness and      quadriparesis and encephalopathy from possible underlying urinary tract      infection and medication related effects.  I recommended decreasing Lyrica      to 75 mg twice a day, and treating any underlying UTI, DVTs, and      recommended an MRI of brain to evaluate any occult strokes, though this is      less likely.            Thank you for this kind referral.                                     D:  10/10/2010 16:22 PM by Dr. [place] ([id number])      T:  10/10/2010 18:16 PM by                  cc:                                   Page 3 of 3      Authenticated and Edited by Chinita Pester, MD ([id number]) On 10/16/10 2:18:46 PM

## 2011-08-09 NOTE — H&P (Signed)
Darlene Landry, Darlene Landry      MRN:          [id number]      Account:      [id number]      Document ID:  [id number]                  Admit Date: 10/11/2010            Patient Location: M534-01      Patient Type: I            ATTENDING PHYSICIAN: Tama Gander, MD                  ADMITTING DIAGNOSES:      Incomplete cervical spinal cord injury, recent right total knee      arthroplasty.  This note will serve as the post admission assessment.            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  She was      subsequently discharged home but on December 10 while walking past the      stairs, somehow fell down them.  She injured her neck and presented to      [facility] with upper limb weakness and numbness and tingling      in the legs.  Workup revealed a central cord contusion, C4-C5 facet      dislocation, and a right C4 transverse process fracture in addition to      cervical ligamentous injuries.  Her spinal cord was deemed unstable.  She      underwent anterior diskectomy and fusion at C4-C7 on December 15 and ORIF      of the cervical facet, left C4-C5 posterior spinal fusion and C4-C through      7 cervical laminectomy on December 16.  Her hospital course is notable for      encephalopathy and urinary tract infection.  Her husband tells me that she      had altered mental status yesterday that has since improved.  They      attribute this to her having received morphine.  She reportedly was seen by      speech therapy today and passed her bedside swallow but she has not yet      undergone a video swallow and has not had anything other than a full liquid      diet and some crackers thus far.  Premorbidly, the patient had been      independent with her mobility and self-care.  More recently following her      knee arthroplasty, she had been ambulating with a rolling walker.  Her       husband was assisting and/or providing supervision on stairs.  She      indicated that she was able to dress and bathe herself without assistance.      At the present time, she requires maximum assistance with rolling. Her      preadmission assessment indicates that she is independent with transfers but      there is a contradictory note from Dr. Luan Pulling from December 20 in which      she required maximum assistance, which is more consistent with her examination.       She is able to sit at edge of bed with  moderate assistance.  She requires      moderate assistance with feeding, grooming, and requires maximum assistance      with bathing and dressing. She has a Foley catheter in place.  She indicates      that she is moving her bowels regularly.  It should be noted that she was      started on Cefepime yesterday, and her urinalysis was very much consistent with      UTI. Culture thus thar demonstrates gram- negative rods and enterococcus with      further speciation pending.  She had a positive blood                                   Page 1 of 5      Darlene Landry, Darlene Landry      MRN:          [id number]      Account:      [id number]      Document ID:  [id number]                  culture x1 which demonstrated GPCs in clusters.            PAST MEDICAL HISTORY:      Dyslipidemia, hypertension, osteoarthritis, hypothyroidism, recent right      total knee arthroplasty.  History of partial hysterectomy for fibroids,      breast surgery in 1989 for an abnormal duct.            MEDICATIONS:      Currently Cefepime, Flexeril, Lovenox, Zetia, Synthroid, Paxil, Lyrica,      Senokot-S, Zocor, Percocet, and Tylenol.            ALLERGIES:      Reported to NSAIDs, IODINE, TENORMIN, CALAN, DIOVAN, AVELOX, and PINEAPPLE.       She reports all of these cause swelling.            SOCIAL HISTORY:      She lives with her husband who is retired and able to assist her.  They      reside in [place] in a multistory home with 4 stairs  to enter.  She has a      second floor bedroom, but indicates that she could stay on the first floor      if necessary.  She is retired and previously worked as an Teacher, music for [employer].  She is a nonsmoker.            FAMILY HISTORY:      Noncontributory.  She indicated both parents had a history of diabetes      mellitus.            REVIEW OF SYSTEMS:      A full review of systems was obtained.  Pertinent positives are some      dizziness, particularly when sitting up, difficulty swallowing, cough      productive of white sputum, weakness, numbness, tingling, some confusion      that she attributed to MORPHINE and easy bruising.  All other systems are      negative.            PHYSICAL EXAMINATION:      VITAL SIGNS:  Heart rate is 95, blood pressure is 127/64.  She is      saturating 98% on  room air.      GENERAL:  This is a well-appearing obese woman who appears stated age.  She      is in no apparent distress.      HEENT:  Sclerae are anicteric.  Extraocular muscles are intact.  Dentition      is in fair repair.  Mucous membranes are moist.  Hearing is grossly intact.       She is wearing an Aspen collar to immobilize her spine.      HEART:  Rate and rhythm are regular.  There are no murmurs, rubs, or      gallops.  She does have 1+ nonpitting edema of all four extremities.      LUNGS:  Clear to auscultation bilaterally with slightly diminished breath      sounds at both bases.  There are no wheezes, rales, or rhonchi.  She                                   Page 2 of 5      Darlene Landry, Darlene Landry      MRN:          [id number]      Account:      [id number]      Document ID:  [id number]                  demonstrates good respiratory effort.      ABDOMEN:  Soft and nontender with normoactive bowel sounds.  There is some      bruising of the lower abdomen corresponding to likely Lovenox injection      sites.      SKIN:  She has an anterior and posterior neck incision both of which are      clean,  dry, and intact with Steri-Strips present.  She has a small skin      tear on the right buttock that appears clean at the present time.      NEUROLOGIC:  She demonstrates 3/5, right elbow flexion, trace elbow      extension, trace grip.  In the left upper limb, she demonstrates 3/5      shoulder abduction, elbow flexion, wrist extension, and grip strength.  She      demonstrates 2/5 elbow extension.  In the left lower limb, she demonstrates      greater than antigravity strength in all major muscle groups.  In the right      lower limb, she demonstrates good plantar flexion, trace ankle      dorsiflexion, trace knee flexion and knee extension, and trace hip flexion,      this is limited by poor range of motion about the right knee.  She is able      to localize light touch in the lower limbs.  She does have a better job on      the right when compared to the left.  She is unable to localize light touch      throughout the bilateral upper limbs.  She has no spasticity at the present      time.      MUSCULOSKELETAL:  She has severely restricted active range of motion about      the right knee.      PSYCHIATRIC:  Alert, pleasant and cooperative.  She is currently oriented      x3.  She had poor  recall, however, of her recent hospitalization and her      husband had to fill in details.            LABORATORY DATA:      Reviewed.  Blood and urine cultures as per HPI.  White count 10.65, H and H      9.5/30.9, platelets 358.  Sodium 137, potassium 4.5, chloride 100,      bicarbonate 31, BUN 16, creatinine 0.5, glucose 103, calcium 9.2, magnesium      1.8, phosphorus 3.3.            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  Her rehabilitation will very much be      complicated by recent right total knee arthroplasty and she is at very high      risk for knee contracture and deep venous thrombosis.            PLAN OF CARE:      1.   Rehabilitation:  She requires comprehensive and intensive inpatient      rehabilitation.  I would like her to be evaluated by speech therapy      tomorrow and I have ordered her a video swallow study as I would like to      ensure that she will not aspirate prior to initiating a regular diet.  I      have entered standard aspiration precautions as well.  I do not anticipate      that she will have a long-term need for speech therapy; however.  She      certainly requires very intensive occupational therapy to improve her upper                                   Page 3 of 5      Darlene Landry, Darlene Landry      MRN:          [id number]      Account:      [id number]      Document ID:  [id number]                  extremity strength which is more impaired than the lower limbs at the      present time.  We will also need to work on preventing upper extremity      contracture.  Early on, occupational therapy will be essential as well for      evaluating her for adaptive equipment including an adaptive drinking device      so that she may self hydrate as well as for adaptive utensils.  In physical      therapy, we need to work on lower extremity strengthening, range of motion      about that knee and improving her functional mobility.  At the present      time, patient also requires 24-hour rehabilitation nursing care for      administration of IV antibiotics, for skin care and for nutrition as      patient is unable to self feed.  She also requires 24-hour rehabilitation      nursing for bladder and bowel management.  The patient is at very high risk      for sepsis, DVT, fecal impaction, pneumonia, and skin breakdown.  I do not      believe she could be safely managed at a lower level of care.  She will      require involvement of our case manager later in her course as we plan to      transition home.  Her husband is an able caregiver and will require      caregiver training from all disciplines.  She may require the involvement       of our rehabilitation psychologist as well for adjustment to disability.  I      have reviewed her preadmission assessment.  I concur with most of what is      written, however, I do believe that her listed transfer status is incorrect      but I very much agree that she is appropriate for acute inpatient      rehabilitation.  I estimate a rehabilitation length of stay of 3 to 4      weeks.  I am hopeful that we will be able to get her to at a minimum assist      level with her functional mobility and with grooming and feeding.  I do      believe she will require more assistance with car transfers and with      bathing initially barring a significant improvement in her motor exam.  The      patient will likely have difficulty climbing stairs and will require at      least moderate assistance if we deem that stairs are safe which at the      present time, they are not.      2.  Spine:  The patient requires an Aspen collar at all times except for      skin checks; spinal precautions have been ordered.      3.  Infectious disease:  The patient is currently on Cefepime for urinary      tract infection as well as a blood culture that demonstrates GPCs and      clusters.  Given her complexity, I would like to involve our infectious      disease consultants to assist in antibiotic management.      4.  Endocrine:  Continue Synthroid for hypothyroidism.      5.  Cardiovascular:  The patient is not currently on any antihypertensive.      I suspect this is due to significant orthostatic hypotension.  We will be      closely monitoring her blood pressures and I will order her TED stockings.      I will also order her an abdominal binder that can be used on an as-needed      basis.      7.  Pain:  Continue Percocet on an as needed basis.  We will also continue      her Lyrica for neuropathic pain.      8.  Psychiatric:  Continue Paxil for presumed depression.      9.  Hematologic:  The patient has postoperative anemia, she  does not      require transfusion at the present time.  We will continue her Lovenox and                                   Page 4 of 5      Darlene Landry, Darlene Landry      MRN:  [id number]      Account:      [id number]      Document ID:  [id number]                  will send her for lower extremity Dopplers to evaluate further for possible      deep vein thrombosis as both legs are quite edematous and again, she is at      very high risk.      10.  Orthopedic:  Will order CPM machine and will provide passive range of      motion of the right knee, provided that the Dopplers returned negative for      deep vein thrombosis.      11. Gastrointestinal:  Continue Senokot-S I will also order Dulcolax p.r.n.      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  We will need to monitor her continence.      12.  Genitourinary:  Will discontinue Foley catheter and catheterize for      volumes that exceed 350 mL.  Should she require frequent catheterization,      will teach patient and her husband in the performance of such.      13.  Skin:  Occlusive dressing will be applied to her sacral wound.  She will      receive routine care for her incisions.            The patient will need followup with Dr. Reynolds Bowl from orthopedics and Dr.      Deloria Lair from neurosurgery following her discharge.                                    D:  10/11/2010 18:19 PM by Dr. Leotis Shames T. Nile Riggs, MD ([id number])      T:  10/11/2010 19:14 PM by [id number]                  cc:                                   Page 5 of 5      Authenticated and Edited by Everlean Alstrom, MD ([id number]) On 10/12/10 9:54:01 PM

## 2011-08-14 ENCOUNTER — Ambulatory Visit
Admit: 2011-08-14 | Discharge: 2011-08-14 | Disposition: A | Discharge status: Home / Self Care | Payer: Self-pay | Source: Ambulatory Visit

## 2011-08-21 NOTE — Progress Notes (Signed)
Darlene Landry, Darlene Landry      MRN:          [id number]      Account:      [id number]      Document ID:  [id number]      Service Date: 10/09/2010            Admit Date: 09/29/2010            Patient Location: FTICU-04      Patient Type: I            PHYSICIAN/PROVIDER: Inda Merlin MD                  HISTORY OF PRESENT ILLNESS:      This is a 70 year old female that fell 12 stairs and had decreased      sensation and motor power to bilateral upper extremities, right worse than      left.  She had a total knee replacement on September 19, 2010.            INJURIES:      Unstable C4-C5 fracture, a wide left C4-C5 facet, disruption of ligamentum      flavum, fracture of the C4-C5 disk and a grade I retrolisthesis of C4 on C5.                  On December 12, the patient had a CTA of her neck that showed no vessel injury.      She has pain of the right ankle, tibia, fibula, and foot x-rays of the right      ankle, tibia, fibula in the foot were negative.            CONSULTANTS:      Dr. Deloria Lair from neurosurgery, Dr. Broadus John from orthopedics, Dr.      Luan Pulling from physical medicine.            IMPORTANT DATES:      December 15, anterior approach C4-C5 reduction, a left radial arterial line and      right subclavian central venous line.            December 16, posterior approach at C4-C5 reduction.            December 17th, ET tube extubation      on December 18th is as follows:            Neurologic, the patient is alert and oriented.  She has 3/5 power in both      upper extremities and lower extremities.  Dr. Deloria Lair recommended to keep      her Aspen collar for 4 weeks.  She was seen by PT, OT.  They recommended      acute inpatient rehabilitation.  She was resumed on her home pain      medications.      1.  The patient was hemodynamically stable.      2.  Pulmonary:  The patient was encouraged to use incentive spirometry      post-extubation.  The patient was kept on CPAP initially and she slowly weaned      off  the nasal cannula.      3.  GI:  The patient had a Corpak in place on December 18 and swallow      evaluation were ordered.      4.  .  Renal:  Her Foley catheter was kept in place. She had good urine output.  Page 1 of 2      Darlene Landry, Darlene Landry      MRN:          [id number]      Account:      [id number]      Document ID:  [id number]      Service Date: 10/09/2010            She was on normal saline running at 75 mL per hour.      5.  ID:  The patient was afebrile.  She did not require any antibiotics.                                    D:  10/09/2010 00:03 AM by Dr. Verne Carrow, MD ([id number])      T:  10/09/2010 01:07 AM by [id number]                  cc:                                   Page 2 of 2      Authenticated and Edited by Verne Carrow, MD ([id number]) On 10/10/10 2:51:32 PM      Authenticated by Inda Merlin, MD ([id number]) On 10/28/2010 01:27:43 PM

## 2011-08-21 NOTE — Discharge Summary (Signed)
Darlene Landry, Darlene Landry      MRN:          [id number]      Account:      [id number]      Document ID:  [id number]                  Admit Date: 09/29/2010      Discharge Date:            ATTENDING PHYSICIAN:  Inda Merlin, MD                  CONSULTANTS:      Orthopedics, Dr. Gwynne Edinger.      Neurosurgery, Dr. Deloria Lair.            DIAGNOSIS:      C4-C5 fracture, status post anterior and posterior approach repair.            REASON FOR REHABILITATION ADMISSION:      Continued management of her central cord syndrome, status post spinal      surgery.            HISTORY OF PRESENT ILLNESS:      Please see admission HPI for full report.      This is a 70 year old lady with severe right knee osteoarthritis s/p surgiical      arthroplasty on September 19, 2010.  She was doing well and was discharged home,      was having outpatient rehabilitation therapy at that time. She had a      mechanical fall downstairs, 12 steps at her home on September 29, 2010.  She was      transferred by EMS to [facility].  When she was in the emergency      department, she was found to have C4-C5 widening, fracture of C4 transverse      process and grade I retrolisthesis of C4 on C5. She was also found to have      ligamentous injuries on MRI and several cord contusions.  She was found to have      bilateral right greater than left upper extremity weakness that was consistent      with central cord syndrome.            HOSPITAL COURSE:      She was admitted to the trauma ICU and was evaluated by neurosurgery.  On      October 04, 2010, she had an anterior C4- C5 reduction and on December 16, she      has a posterior approach C4- C5 reduction.  She had a right subclavian TLC and      an arterial line placed.  She remained intubated and was transferred to the      trauma ICU. She was extubated on December 18 and she continued to do well.            MEDICATIONS:      1.  Flexeril 10mg  PO Q8hrs      2.  Zetia 10mg  PO QHS      3.  Lasix  20mg  PO QDay      4.  Synthroid PO QDay      5.  Paxil 20mg  PO QDay      6.  Lyrica 150mg  PO Q12hrs      7.  Simvastatin 10mg  PO QHS      8.  Senokot 8.6-50mg  Tab PO Q12hrs  Her status is as follows:                                   Page 1 of 2      Darlene, Landry      MRN:          [id number]      Account:      [id number]      Document ID:  [id number]                        Neuro:  She is alert and oriented. She continues to have some weakness in upper      and lower extremities, had approximately 3/5 strength in her legs with approx      2/5 strength in her arms bilaterally.  She has diminished sensation in her arms      bilaterally, and is begining to have some pain in her right arm today which she      views as a positive improvement. Per Dr. Deloria Lair, neurosurgery, she is to stay      in the Aspen collar for 4 weeks from October 07 2010. PT and OT saw and      evaluated the patient and recommended inpatient spine rehabilitation.  She is      able to bear weight and preform activity with assistance as tolerated.            Pulmonary:  Satting well on 2L NC, encouraged to continue to use her incentive      spirometry.            GI:  Had been receiving tube feeds through Corpak placed on December 18 because      of difficulty swallowing post-intubation and post op.  She had a swallow      evaluation at bedside today and passed, is able to tolerate a liquid diet.            Renal: Her Foley will be removed today. She has continued to have good urine      output with no issues, currently off normal saline.            Heme/ID:  She continues to      be afebrile and has not had any evidence of infection.  The patient is to      continue her home medications including Lasix 20 mg p.o. daily, Zetia 10 mg      p.o. q.h.s., simvastatin 20 mg p.o. q.h.s., Lyrica 150 mg p.o. q.12 h., Paxil      20 mg p.o. daily, Synthroid 50 mcg p.o. daily, Flexeril 10 mg p.o. q.8 h.  She      will be  discharged on Roxicet 10 mL p.o. q.4 h. p.r.n. and will be continued on      Lovenox 30 mg subcutaneously q.12 h. for the next 3 weeks.            ACTIVITIES:      The patient is to perform incentive spirometry, breathing exercises q.2 h.      while awake.  She is to remain in her Aspen collar at all times x4 weeks.      She is able to participate in PT, OT as tolerated.            FOLLOWUP:      The patient is to  follow up with orthopedics, her own physician, Dr.      Reynolds Bowl [phone].  She is to followup with neurosurgery, Dr. Deloria Lair in 4      weeks [phone] and to continue to wear the Aspen collar until that time.      She is to followup with her primary care physician, as needed.                                    D:  10/09/2010 07:54 AM by Dr. Alben Spittle. [place] ([id number])      T:  10/09/2010 12:18 PM by BP                        cc:                                   Page 2 of 2      Authenticated and Edited by Alben Spittle. Jerolyn Center, MD ([id number]) On 10/09/10 1:01:21 PM      Authenticated by Inda Merlin, MD ([id number]) On 10/28/2010 01:27:46 PM

## 2011-08-21 NOTE — Op Note (Signed)
CLARIVEL, CALLAWAY      MRN:          [id number]      Account:      [id number]      Document ID:  [id number]      Procedure Date: 10/05/2010            Admit Date: 09/29/2010            Patient Location: [facility]      Patient Type: I            SURGEON: Marlaine Hind MD      ASSISTANT:  Iva Lento PA                  PREOPERATIVE DIAGNOSES:      1.  C4-C5 left facet dislocation, with spinal instability.      2.  Cervical spinal cord injury, incomplete, with central cord syndrome.      3.  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            POSTOPERATIVE DIAGNOSES:            TITLES OF PROCEDURES:      1.  C4-C5 open reduction, internal fixation of a left cervical facet      dislocation/      2.  Left C4-C5 posterior nonsegmental fixation, with C4-C5 posterior      lateral arthrodesis.      3.  C4-C5, C6-C7 cervical laminectomies.            ESTIMATED BLOOD LOSS:      100 mL.            COMPLICATIONS:      None.            DISPOSITION:      To recovery.            INDICATIONS:      The patient is a 70 year old woman with spinal cord injury following a      fall.  The patient noted to have a facet dislocation, left side, C4-C5,      with stenosis underlying that, extending down to C7.  The patient had      central cord syndrome.  The above procedure was therefore indicated.      Risks, benefits were discussed with the patient, including, but not limited      to, bleeding, infection, coma, paralysis, death, worsened neurological      deficits, hardware failure, hardware instability, failure to fuse, and she      elected to proceed.            DESCRIPTION OF PROCEDURE:      The patient was brought into the operating room.  She had already been      intubated from the first stage of her procedure.  The patient was placed in                                   Page 1 of 3      SHATYRA, BECKA      MRN:          [id number]      Account:      [id number]      Document ID:  [id number]      Procedure Date: 10/05/2010             a prone  position on log rolls, with the head in a neutral position.  Her      neck was prepped and draped in regular sterile fashion.  Midline incision      was made over the spinous processes, extending from C3 down to C7.  The      ligamentum nuchae was transected, and bilateral subperiosteal dissection      was carried out, with self-retaining retractors placed.  The resection was      extended out over the lateral masses from C4 to C7.  The appropriate levels      were localized after the fluoroscopy was draped and brought into the field      at this point, the holes were prepared for placement of lateral mass      screws.  The entry point was 1 mm medial to the midline at the lateral      mass, with a unicortical purchase done.  The trajectory was approximately      30 degrees rostral, 20 degrees lateral.  This was done with a drill,      followed by a tap.  This was done on the left side only at C4 and C5,      because of the patient's tenuous vertebral artery only on the left, and a      dominant vertebral artery being on the right.  The facet was noted to be      distracted secondary to dislocation intraoperatively.  Following placement      of the holes for the lateral mass screws, the laminectomies from C4 to C7      were carried out.  This was done in en bloc fashion, with lateral troughs      made in the laminae from C4 to C7.  The interspinous ligament was then      transected with a Leksell rongeur, microcurettes, and 1 and 2 mm Kerrisons      were used to complete the left laminectomies bilaterally.  The en bloc bone      was then lifted up and removed in complete fashion.  The thecal sac was      thoroughly decompressed.  At this point, hemostasis of the epidural veins      was achieved.  At this juncture, the lateral mass screws were placed at      C4-C5 on the left.  A 10 mm screw was placed at C4.  A 12 x 3.5 mm Zimmer      spine screw was placed at C5.  At this point, I unscrubbed and reduced  the      facet dislocation further and then rescrubbed into the operative theater.      The bone in the facet on the left was then decorticated, and autograft was      packed into the posterolateral gutter.  A connecting rod was then placed      and tightened to its final torque tightness.  Hemostasis was obtained of      the paraspinal musculature.  Muscle was injected with Marcaine with      epinephrine.  The muscle was closed in a multilayered fashion, along with      the fascia with 3-0 Vicryl stitches, followed by approximation of the      ligamentum nuchae posteriorly with 2-0 Vicryl stitches, followed by      interrupted 3-0 Vicryl stitches through the dermis.  Subcuticular 4-0      Monocryl stitch  was then placed, and the wound was dressed with      Steri-Strips, gauze, and Tegaderm.  The patient was returned to a      [place] collar and transferred to the ICU.                        Electronic Signing Provider            D:  10/05/2010 14:55 PM by Dr. Irene Shipper. Deloria Lair, MD ([id number])      T:  10/05/2010 21:06 PM by [email or web address]                                         Page 2 of 3      MILAN, CLARE      MRN:          [id number]      Account:      [id number]      Document ID:  [id number]      Procedure Date: 10/05/2010                  cc:                                   Page 3 of 3      Authenticated by Irene Shipper. Deloria Lair, MD ([id number]) On 11/09/2010 12:55:28 PM

## 2011-08-21 NOTE — Op Note (Signed)
Darlene Landry, Darlene Landry      MRN:          [id number]      Account:      [id number]      Document ID:  [id number]      Procedure Date: 10/04/2010            Admit Date: 09/29/2010            Patient Location: [facility]      Patient Type: I            SURGEON: Marlaine Hind MD      ASSISTANT:                  FIRST ASSISTANT      Teryl Lucy PA            PREOPERATIVE DIAGNOSES:      Central spinal cord injury, with C4-C5 facet dislocation, and C5-C6 and      C6-C7 cervical stenosis secondary to disk osteophyte complexes.            POSTOPERATIVE DIAGNOSES:      Central spinal cord injury, with C4-C5 facet dislocation, and C5-C6 and      C6-C7 cervical stenosis secondary to disk osteophyte complexes.            TITLES OF PROCEDURES:      1.  Anterior approach to the cervical spine, with C4-C5, C5-C6, and C6-C7      complete anterior cervical diskectomies using microsurgical technique.      2.  Arthrodesis at C4-C5, C5-C6, and C6-C7 anteriorly.      3.  Placement of intervertebral biomechanical cages at C4-C5, C5-C6, and      C6-C7, filled with allograft.      4.  Placement of an anterior cervical plate with vertebral body screws at      C4, C5, C6, and C7.            ESTIMATED BLOOD LOSS:      50 mL.            COMPLICATIONS:      None.            DISPOSITION:      To recovery room.            INDICATIONS:      The patient is a 70 year old woman status post fall, with incomplete spinal      cord injury.  She had a left facet that was partially dislocated and      cervical spondylosis with stenosis at the levels of C5-C6 and C6-C7.      Therefore, the above procedure was indicated.  Risks, benefits were      discussed with the patient, including, but not limited to, bleeding,      infection, injury to the tracheoesophageal complex, hoarseness, injury to                                   Page 1 of 4      Darlene, Landry      MRN:          [id number]      Account:      [id number]      Document ID:  [id number]      Procedure  Date: 10/04/2010            recurrent laryngeal nerve, coma,  paralysis, death, and the need for a      second posterior-stage procedure for stabilization purposes.  In addition,      the risks of adjacent segment disease were discussed, and the patient and      her family elected to proceed.            DESCRIPTION OF PROCEDURE:      The patient was brought into the operating room.  She was intubated without      any extension of her neck.  She was then padded, prepped, and draped in      regular sterile fashion, with care taken to maintain C-spine precautions      throughout.  Neuromonitoring was set up at the beginning of the case.      Readings in the hands were poor.  Regions in the bilateral lower      extremities were present, but diminished.  These remained stable throughout      the patient's case.            After the patient had been prepped and draped in regular sterile fashion, a      transverse incision was made through a major skin fold.  The skin had been      infiltrated with lidocaine with epinephrine.  Incision was extended down      through the platysma, which was then undermined.  The dissection was      carried out using sharp and blunt dissection medial to the      sternocleidomastoid, lateral to the tracheoesophageal complex, and medial      to the carotid.  Prevertebral fascia was opened sharply.  Spinal needles      were placed, and the appropriate levels were identified.  Upon inspection,      initial disruption of the anterior longitudinal ligament was noted, with      widening of the disk space at C4-C5.  The patient had anterior osteophytes      across the disk spaces at C5-C6 and at C6-C7.  Beginning at the level of      C4-C5, as this was the most symptomatic level.  The longus colli muscle was      undermined, and a self-retaining retractor was placed, followed by      placement of Caspar pins in the C4 and C5 vertebral bodies.  The microscope      was draped and brought into the  field.  The anterior osteophyte at C4 was      removed with Leksell rongeur, followed by superficial, followed by deeper,      diskectomy with microcurettes, Kerrison rongeurs, and micropituitaries.      There was already partial disruption of the posterior longitudinal ligament      secondary to the injury.  The thecal sac was thoroughly decompressed with 1      and 2 mm Kerrisons.  Endplates were then polished with a high-speed drill;      the space was sized; and a 7 mm, 7-degree lordotic intervertebral cage was      decided upon.  This was filled with allograft and countersunk below the      anterior segment of the spinal column.  The Caspar pin was then removed      from the C4 vertebral body.  The bony defect was filled with Gelfoam.  The      Caspar pin was then placed in the C6 vertebral body.  The disk  space was      incised from side-to-side, and the longus colli muscle was undermined.  The      retractor underneath the longus colli muscle was adjusted for the C5-C6      space.  An anterior osteophyte over the line of the disk space was drilled      through, and the osteophyte was shaved down until the C5 level was even      with the undersurface of the endplate.                                         Page 2 of 4      ARMEDA, PLUMB      MRN:          [id number]      Account:      [id number]      Document ID:  [id number]      Procedure Date: 10/04/2010            At this point, the diskectomy was performed with curettes, micro      pituitaries, and 1 to 2 mm Kerrisons until the disk was completely removed.       The uncovertebral joint and disk were noted to be posterior to the      posterior longitudinal ligament.  This was incised with a 15-blade and      sharp nerve hook.  The thecal sac was thereby decompressed with 1 to 2 mm      Kerrison rongeur.  The endplate was then polished with a high-speed drill,      and a 7 mm, 7-degree lordotic intervertebral cage was placed and      countersunk 1 to 2  mm.  The cage was filled with allograft.  At this point,      the Caspar pin was removed from the C5 vertebral body.  The defect was      filled with Gelfoam.  The longus colli muscle was undermined at the C6-C7      level, and then the retractor was adjusted.  The Caspar pin was then placed      in C7 vertebral body level.  The anterior ossified ALL was drilled through,      and the disk space was incised.  The space was then distracted.  A      microcurette and Kerrison rongeur were used to complete the superficial,      followed by the deeper, diskectomy using microsurgical technique, and then      the posterior longitudinal ligament was opened. The ligament was noted to      be partially adherent to the dura, and therefore a portion of it was left,      so as not to cause a CSF leak.            Following decompression of the thecal sac to as great a degree as possible,      endplate was polished with a high-speed drill, and the 7 mm, 7-degree      lordotic intervertebral cage was countersunk 1 to 2 mm. It was filled with      allograft prior to placement. At this point, the Caspar pins were removed.      The bony defects were filled with Gelfoam.  A plate was sized, and a 63 mm  Trinica plate was decided upon.  X-rays were performed to ascertain its      positioning, and then 14 mm screws were placed into the C4-C5, C6-C7      vertebral body levels.  These were checked on lateral fluoroscopy and then      tightened to their final tightness, and the locking mechanism was      initiated.  Soft tissue was inspected medially and laterally, and the wound      was irrigated with bacitracin with saline. The platysma was then closed      with inverted 3-0 Vicryl stitches, followed by closure of the dermis with      inverted 3-0 Vicryl stitches, followed by closure of the subcuticular plane      with 4-0 Monocryl.            COUNTS:      All needle and sponge counts were correct prior to closure.                         Electronic Signing Provider            D:  10/06/2010 15:26 PM by Dr. Irene Shipper. Del[place] ([id number])      T:  10/06/2010 22:59 PM by [id number]                                               Page 3 of 4      VIKA, BUSKE      MRN:          [id number]      Account:      [id number]      Document ID:  [id number]      Procedure Date: 10/04/2010            cc:                                   Page 4 of 4      Authenticated and Edited by Irene Shipper. Deloria Lair, MD ([id number]) On 11/09/10 12:56:14 PM

## 2011-08-21 NOTE — Discharge Summary (Signed)
Darlene Landry, Darlene Landry      MRN:          [id number]      Account:      [id number]      Document ID:  [id number]                  Admit Date: 10/11/2010      Discharge Date: 11/14/2010            ATTENDING PHYSICIAN:  Tama Gander, MD                  ADMITTING DIAGNOSIS:      Cervical central cord syndrome.            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  She was      subsequently discharged home, but on December 10 while walking by the stairs      somehow fell down.  She injured her neck and presented to [facility] with upper limb weakness and numbness, as well as tingling in the      bilateral lower extremities.  Workup revealed a central cord contusion,      C4-C5 facet dislocation, and a right C4 transverse process fracture, in      addition to cervical ligamentous injury.  Her spinal cord was deemed      unstable and she underwent anterior diskectomy and fusion at C4 through C7      on December 15 and ORIF of the cervical facet, left C4-C5 posterior spinal      fusion, and C4 through C7 cervical laminectomies on December 16.  Her      hospital course was notable for encephalopathy and urinary tract infection.            Premorbidly, the patient had been independent with her mobility and self-care.      Following her knee arthroplasty, she had been ambulating with a rolling walker      and her husband was assisting and/or providing supervision with negotiation of      stairs.  She had been able to dress and bathe herself without assistance.  At      the time of her transfer to our rehabilitation unit, she required maximum      assistance with rolling and with transfers.  She was able to sit at edge of bed      with moderate assistance and required moderate assistance with feeding and      grooming, and maximum assistance with bathing and dressing.  She had a  Foley      catheter in place.            HOSPITAL COURSE:      1.  Rehabilitation:  She received comprehensive and intensive inpatient      rehabilitation.  She initially received speech therapy, but is now      tolerating a regular consistency diet without signs or symptoms of      aspiration, hence no longer requires speech therapy.  She has been      receiving intensive physical and occupational therapy and has made      significant functional gains.  She is now able to perform transfers with      minimal assistance and propel her wheelchair,  also with minimal assistance.       Her husband has received intensive caregiver training and is now      independent with her care.  She continues to wear an [place] collar for      cervical stability.      2.  Cardiovascular:  She continues on Lasix and potassium supplementation,      which she had been on premorbidly.                                   Page 1 of 2      Darlene Landry, Darlene Landry      MRN:          [id number]      Account:      [id number]      Document ID:  [id number]                  3.  Endocrine:  She continues on Synthroid for hypothyroidism.  TSH was      checked during this hospital stay and was within normal limits.      4.  Infectious disease:  She had an enterococcal UTI early on in her course      and responded to a course of antibiotics.      5.  Genitourinary:  She was initially retaining urine and required      catheterization.  We have started her on Flomax, which has improved her      urinary retention such that she is no longer requiring catheterization.      6.  Neurologic:  She reports intermittent spasms, although has no increased      tone on passive range of motion.  This is responding nicely to baclofen at      bedtime.      7.  Psychological:  She had expected difficulty adjusting to her disability and      responded to supportive psychotherapy from our rehabilitation psychologist.      She also remains on Paxil.      8.  Musculoskeletal:  She  continued to receive CPM      and physical therapy here aimed at improving her right lower extremity range of      motion following her recent total knee arthroplasty.            DISCHARGE INSTRUCTIONS:      The patient will have followup with Dr. Deloria Lair with regards to her neck      injury and need for cervical collar.  He will follow up with him in mid      February with preceding films.  She will also follow up with Dr. Reynolds Bowl, her      orthopedic surgeon, with regards to ongoing need for her CPM.  She should      follow up with her primary care physician as well for ongoing medical      management.  She will also see me in clinic in approximately 1 week's time to      help transition her to outpatient therapies following a course of home care.            DISCHARGE MEDICATIONS:      Baclofen 5 mg p.o. nightly, Zetia 10 mg p.o. nightly, Lasix 20 mg p.o.      daily, Klor-Con 40 mEq p.o. daily, Neurontin 100 mg p.o. b.i.d. with meals,  Synthroid 50 mcg p.o. daily, Protonix 40 mg p.o. daily, Paxil 10 mg p.o.      nightly, Zocor 20 mg p.o. nightly, Flomax 0.8 mg p.o. nightly.                                    D:  11/13/2010 17:19 PM by Dr. Leotis Shames T. Nile Riggs, MD ([id number])      T:  11/14/2010 09:44 AM by [id number]                        cc:  Jaquita Rector MD           Baker Janus MD           Rosemarie Beath MD                                   Page 2 of 2      Authenticated and Edited by Everlean Alstrom, MD ([id number]) On 11/14/10 1:45:53 PM

## 2011-08-21 NOTE — Progress Notes (Signed)
EMNET, MONK      MRN:          [id number]      Account:      [id number]      Document ID:  [id number]      Service Date: 12/05/2010            Admit Date: 12/05/2010            Patient Location: DISCHARGED 12/06/2010      Patient Type: O            PHYSICIAN/PROVIDER: Marlaine Hind MD                  HISTORY OF PRESENT ILLNESS:      The patient presents for followup.  She is status post anterior C4-C7 ACDF      followed by a posterior cervical laminectomy and C4-C5 fusion for central      spinal cord injury.  Patient has made significant progress and      rehabilitation.  She has been discharged home.  She is able to stand with      support.  She has gained increased sensation in her hands and no longer      does it feel like nails in her hands and now is reduced to just /"tingling      in bilateral hands./"  Strength overall has improved, more so in the left      upper extremity than in the right upper extremity, but even in the right      upper extremity she has begun to have some weak grip strength and is able      to utilize the right upper extremity for support mechanisms.  She presents      today with new upright x-rays of her C-spine.            C-spine x-rays indicate good stable positioning of the instrumentation,      normal lordotic curvature of the cervical spine, with good position of the      instrumentation.            PHYSICAL EXAMINATION:      VITAL SIGNS:  She is afebrile, heart rate 88, blood pressure 110/80.      NEUROLOGIC:  Cranial nerves II-XII intact.  Deltoid is 4- on the right, 4      on the left; biceps 4 on the left, 5 on the right; triceps 3 on the right,      5 on the left; grip strength 2 on the right, 4 on the left; intrinsics 1 on      the right and 4 on the left.  Her left lower extremity is 5/5 throughout,      psoas, quadriceps, dorsiflexion, plantarflexion.  Her right lower extremity      psoas is 2/5, quadriceps is 3/5, dorsiflexion is 2/5, and plantarflexion is       4-/5.            IMPRESSION:      Improving central cord syndrome, status post C4-C7 anterior cervical      diskectomy and fusion and a cervical laminectomy with C4-C5 fusion for a      facet fracture.            PLAN:      The patient will be weaned out of her collar over the next 2 months.  She      will follow up with me  thereafter.  She was given a prescription for      Percocet.  Her amount of pain medications has come down substantially.      Currently, she takes 5 mg once a day and generally this is prior to a                                   Page 1 of 2      OMEGA, DURANTE      MRN:          [id number]      Account:      [id number]      Document ID:  [id number]      Service Date: 12/05/2010            physical therapy session.  She will continue with gentle physical therapy.                        Electronic Signing Provider            D:  12/05/2010 15:16 PM by Dr. Irene Shipper. Deloria Lair, MD ([id number])      T:  12/06/2010 16:55 PM by [id number]                  cc:                                   Page 2 of 2      Authenticated by Irene Shipper. Deloria Lair, MD ([id number]) On 12/24/2010 09:45:47 AM

## 2011-08-21 NOTE — H&P (Signed)
Darlene Landry, Darlene Landry      MRN:          [id number]      Account:      [id number]      Document ID:  [id number]                  Admit Date: 09/29/2010            Patient Location: [facility]      Patient Type: I            ATTENDING PHYSICIAN: Inda Merlin, MD                  CHIEF COMPLAINT:      Fall.            HISTORY OF PRESENT ILLNESS:      This is a 70 year old female status post right total knee replacement about      10 days ago, who fell forward down approximately 12 steps this morning.      She lost her balance while walking to the bathroom with a walker.  She did      hit her head, but she denies any loss of consciousness.  She had decreased      sensation and focal weakness on the exams and was incontinent at the scene.       She was brought in with a GCS of 15.  She currently takes Lovenox.            PAST MEDICAL HISTORY:      Hypertension, hyperthyroidism.            PAST SURGICAL HISTORY:      She had a right knee replacement as well as a hysterectomy in the past.            MEDICATIONS:      She is on Lovenox once daily, unknown dose.  She is also on Synthroid.            FAMILY HISTORY:      Noncontributory.            SOCIAL HISTORY:      She denies any alcohol, tobacco, or drug use.            ALLERGIES:      She has no known allergies.            REVIEW OF SYSTEMS:      She is complaining right now of right hip pain, right shoulder pain, right      lower extremity pain, and neck pain.  Otherwise, 10-point review of systems      was negative.            PHYSICAL EXAMINATION:      VITAL SIGNS:  Blood pressure 120/53, heart rate 66, respiratory rate 20,      saturating 100% on room air.                                   Page 1 of 3      Darlene Landry, Darlene Landry      MRN:          [id number]      Account:      [id number]      Document ID:  [id number]                  GENERAL:  She  is a pleasant appearing, obese female, mild distress.      HEENT:  She is normocephalic, atraumatic.  Pupils are equally  round and      reactive to light.  Extraocular movements are intact.  Oropharynx was      clear.  She had no hemotympanum, no septal hematoma.      NECK:  C-collar was in place.  She had tenderness in the midline at the      level of approximately mid C-spine C4 to C5.      CHEST:  Her chest wall was without contusion, but she was tender to      palpation in the right lower chest wall.  She was clear to auscultation.      CARDIOVASCULAR:  Regular rate and rhythm.      ABDOMEN:  Soft, nontender, nondistended.  Her pelvis was stable, but she      was tender on the right side.      BACK:  No L-spine tenderness, but she did have tenderness to her T-spine.      RECTAL:  No tone.  She was incontinent of stool.      EXTREMITIES:  She had no gross deformities in her extremities; however, she      was zero to 1/5 in bilateral elbows.  She was 3/5 for shoulder shrug, 5/5      in left upper extremity, 3/5 in the right upper extremity.  She had      decreased sensation to light touch and pinprick bilateral elbows and then      distally in her legs.  She had 0/5 hand strength.            DIAGNOSTIC DATA:      She underwent a chest x-ray and a pelvic x-ray which were negative.  She      had a CT of her head, C-spine, chest, abdomen, and pelvis with T and L      recons.  These demonstrated nondisplaced fracture in C4 right transverse      process with involvement of the foramen transversarium, pathological      widening with grade I retrolisthesis C4 on C5 felt to be traumatic in      nature.  Otherwise, her films were unremarkable.            IMPRESSION AND PLAN:      This is a 70 year old female status post fall with a C-spine fracture that      is unstable and a neurological defect.  She also has some right knee pain.      At this point in time, the patient will be admitted to the intensive care      unit.  We will get neurosurgery to evaluate her for spinal process.  She      also had a right knee replacement.  We will talk to  her primary orthopedic      surgeon and we will observe her in the intensive care unit for any changes      in her neurologic status.  I have discussed this with the neurosurgeon      on-call, who is coming to see the patient immediately.  Total Critical Care      time minus procedures is 40 minutes.  D:  10/10/2010 14:57 PM by Dr. Inda Merlin, MD ([id number])      T:  10/10/2010 15:20 PM by [id number]                                               Page 2 of 3      Darlene Landry, Darlene Landry      MRN:          [id number]      Account:      [id number]      Document ID:  [id number]                  cc:                                   Page 3 of 3      Authenticated and Edited by Inda Merlin, MD ([id number]) On 10/28/10 1:11:20 PM

## 2011-08-21 NOTE — Consults (Signed)
Darlene Landry, Darlene Landry      MRN:          [id number]      Account:      [id number]      Document ID:  [id number]      Service Date: 09/29/2010            Admit Date: 09/29/2010            Patient Location: DISCHARGED 10/11/2010      Patient Type: I            CONSULTING PHYSICIAN: Marlaine Hind MD            REFERRING PHYSICIAN:                  HISTORY OF PRESENT ILLNESS:      This is a 70 year old, left-hand dominant woman status post fall down 12      steps.  No loss of consciousness.  The patient was walking to the bathroom,      lost her balance, fell down a stairwell.  She felt weakness in her arms      immediately, brought to [facility] as a code yellow.  Currently      complains of neck pain.  Denies any bowel incontinence.            PAST MEDICAL HISTORY:      Hypertension, hypercholesterolemia, depression, hypothyroidism, right total      knee replacement 1 week ago, hysterectomy, right breast surgery.            ALLERGIES:      AVELOX, CALAN, DIOVAN, TENORMIN, and PINEAPPLE.            SOCIAL HISTORY:      The patient does not smoke tobacco and drinks ethanol socially.  No IV drug      use.  Lives with husband.            CURRENT MEDICATIONS:      Include Lasix, Cozaar, amlodipine, Arthrotec, Synthroid, Vytorin, Paxil.            FAMILY HISTORY:      Noncontributory.            LABORATORY DATA:      White count 13.5, hemoglobin 9.5, platelets 364.  PT 13.7, PTT is 22.      Sodium is 137, potassium 4.1.  CT is positive for a grade I retrolisthesis      of left C4 on C5 with a right C4 transverse process fracture extending      along the foramen transversarium and moderate stenosis at C5-C6 and C6-C7.            PHYSICAL EXAMINATION:      Temperature 96.2, heart rate 53, respirations 16, blood pressure 98/54,      saturating 100% on room air.  Extraocular movements intact.  Pupils equal      and reactive to light.  Face symmetric.  Tongue midline.  Uvula elevates      symmetrically.  Hearing intact to  finger rub.  Shoulder shrug 5/5.  On                                   Page 1 of 2      Darlene Landry, Darlene Landry      MRN:          [id number]  Account:      [id number]      Document ID:  [id number]      Service Date: 09/29/2010            motor exam, the deltoids are 4/5 bilaterally, triceps 1 on the right and 3      on the left.  Biceps are 4 minus bilaterally.  Wrist extension is 2 on the      right and 3 on the left.  Intrinsics are 3 on the right and 1 on the left.      In the lower extremities, psoas is 4 on the right and 2 on the left,      plantar flexion is 4 plus on the left and 3 on the right, dorsiflexion 4 on      the left and 2 on the right, and EHL is 4 on the left and 2 on the right.      Sensation is subjective to dysesthesias in the right upper and lower      extremity, intact on the left.  She has a positive Babinski on the right      and a Hoffmann, with cervical tenderness to palpation.            DIAGNOSTIC DATA:      MRI of the C-spine demonstrates increased signal at C4-C5 disk space with      protrusion ventrally with cord compression, cervical stenosis C5-C6 and      C6-C7, widening of the facet joint at C4-C5, dislocation, and good flow      voids.            IMPRESSION:      C4-C5 facet disruption with mild retrolisthesis with symptoms consistent      with central cord syndrome.            PLAN:      The patient was admitted to the ICU.  Every hour neurologic checks.  She      will require a stabilization procedure and decompression, probably from an      anterior and posterior approach.  There is significant swelling within the      spinal cord.  Therefore, the decompression will take place in a delayed      fashion.  In the interim, the patient will be treated with DVT prophylaxis      utilizing Lovenox.  Would not institute methylprednisolone high-dose      steroid treatment.  Head of bed may be at 0 degrees and I would maintain spinal      precautions in turning.  If the patient should  have any decline in      neurological status, then the surgery may be moved up in time.                        Electronic Signing Provider            D:  10/11/2010 16:26 PM by Dr. Irene Shipper. Deloria Lair[place] MD ([id number])      T:  10/11/2010 17:11 PM by [id number]                  cc:                                   Page 2 of 2      Authenticated and Edited by  Irene Shipper. Deloria Lair, MD ([id number]) On 11/09/10 12:57:46 PM

## 2011-08-21 NOTE — Discharge Summary (Signed)
Darlene Landry, Darlene Landry      MRN:          [id number]      Account:      [id number]      Document ID:  [id number]                  Admit Date: 09/29/2010      Discharge Date: 10/11/2010            ATTENDING PHYSICIAN:  Inda Merlin, MD                  CONSULTING PHYSICIANS:      1.  Dr. Gwynne Edinger.      2.  Dr. Deloria Lair.      3.  Dr. Vinnie Level of neurology.            DIAGNOSIS:      C4 to C5 fracture.            PROCEDURES:      December 15, anterior approach C4 to C5 reduction, C4 to C7 diskectomy.      December 16 posterior approach C4 to C5 ORIF left cervical facet      dislocation and C4 to C7 cervical laminectomy.  December 17 extubated.            Since the previously dictated discharge summary the patient had an acute      episode of confusion the morning of December 21, after receiving Ativan and      morphine.  With the discontinuation of these drugs and treatment of a      urinary tract infection with Cefepime, the patient's mental status has      cleared.  We did at the time obtain a CT scan of the head, which was      negative, and a CT of the cervical spine showing postoperative changes.  We      also consulted neurology who felt that the majority of her symptoms were      related to her central cord syndrome and they recommended decreasing her      dose of Lyrica.            She is currently a GCS of 15, alert and oriented x3 with no further      episodes of confusion.  From a pulmonary standpoint, her oxygen was weaned      several days ago and she has had good room air saturations and no pulmonary      events.  From a cardiovascular standpoint, she is hemodynamically stable.      From a GI standpoint, she is currently on a full liquid diet and this      should be reevaluated in the coming days with another swallow study as      appropriate to have her diet advanced and we are currently holding her      Peri-Colace secondary to multiple stools during the day yesterday.      Bilateral upper extremity  duplex were obtained today and she has no      evidence of DVT.  Her Lovenox was held briefly and she is back on a      prophylactic dose of Lovenox now.  Preliminary urine culture shows      gram-negative rods and enterococcus and her Cefepime should be continued      based on the pending cultures.  Blood cultures are also pending at this      point.  DISCHARGE DISPOSITION:                                   Page 1 of 2      Darlene Landry, Darlene Landry      MRN:          [id number]      Account:      [id number]      Document ID:  [id number]                  [facility] acute rehabilitation.            DISCHARGE MEDICATIONS:      1.  Include Cefepime 2 grams IV q.12 h., stop date December 30 again,      please note this medication may need to be adjusted based on the pending      urine cultures.      2.  Flexeril 10 mg p.o. q.8 h.      3.  Lovenox 30 mg subcutaneous q.12 h.      4.  Zetia 10 mg p.o. q.h.s.      5.  Lasix 20 mg p.o. daily.      6.  Synthroid 50 mcg p.o. daily.      7.  Paxil 10 mg.      8.  Lyrica 75 mg p.o. q.12 h.      9.  Stool softeners as appropriate based on normal stool pattern, currently      holding Peri-Colace.      10.  Zocor 20 mg p.o. q.h.s.      11.  Tylenol 650 mg p.o. q.4 h. p.r.n. pain, not to exceed 4000 mg in a      24-hour period.      12.  Zofran 4 mg IV q.24 h. p.r.n. nausea.      13.  Percocet 5/325 one p.o. q.4 h. p.r.n. pain.            No followup is needed with the trauma department, but please feel free to      call with questions.  Please follow up with your orthopedic surgeon Dr.      Reynolds Bowl in 1 to 2 weeks.  Follow up with neurosurgery, Dr. Deloria Lair in 4      weeks.  He will follow with neurology as needed at this time.  It should      also be noted that her Foley was replaced on December 21.  A void trial may      be appropriate in the coming days.                                    D:  10/11/2010 14:20 PM by Mrs. Jimmye Norman, PA ([id number])      T:   10/12/2010 05:43 AM by [id number]                        cc:                                   Page 2 of 2      Authenticated and Edited by Bonna Gains, PA ([id number]) On 10/12/10 9:44:05 AM  Authenticated by Inda Merlin, MD ([id number]) On 10/28/2010 01:27:50 PM

## 2011-08-21 NOTE — Progress Notes (Signed)
Darlene Landry, Darlene Landry      MRN:          [id number]      Account:      [id number]      Document ID:  [id number]      Service Date: 02/06/2011            Admit Date: 02/06/2011            Patient Location: DISCHARGED 02/07/2011      Patient Type: O            PHYSICIAN/PROVIDER: Marlaine Hind MD                  HISTORY OF PRESENT ILLNESS:      The patient is a 70 year old woman status post an anterior C4 through C7      ACDF followed by a posterior cervical laminectomy with C4-C5 posterolateral      fusion for central spinal cord injury.  The patient has made significant      progress and continues to do so.  She is able to ambulate with a walker at      home.  She is able to stand by her sink and take care of some activities of      daily living by herself.  She is scheduled to start outpatient physical      therapy and occupational therapy at [facility] next week.  The patient      has been out of the collar, and she has some fatigue with  ambulation.  For      distances, she utilizes a wheelchair.  Intermittent pain is still noted at      the base of her neck and the trapezius.  She has some numbness and tingling      predominantly in her right arm, but in both hands.            X-rays taken today indicate good positioning of the hardware and adequate      cervical lordosis.            The patient is currently on Neurontin 600 twice a day.  She is under the      care Dr. Nile Riggs at [facility] for her rehabilitation.            PHYSICAL EXAMINATION:      On motor assessment, she is now able to supinate the right forearm.  On      supination, she is 3 on the right, 5 on the left.  On wrist extension, she      is able to extend her wrist to 4 on the right and she is 5 on the left.      Biceps is 5 on the left and 4 on the right.  Triceps is 5 on the left and 3      to 4 on the right.  Deltoid, 5 on the left, 4+ on the right.  Lower      extremities:  She proximal right-sided weakness of 3's.   Left side is 4+,      and distally she is 4+ on the left and 4 on the right.  Deep tendon      reflexes are hyperreflexic, +3 for the brachioradialis and +3 for the      biceps and triceps bilaterally.  The patient has a positive Hoffman      bilaterally.  IMPRESSION:      Status post spinal cord injury with a facet fracture status post C4 to C7      anterior cervical discectomy and fusion with posterior laminectomy with      posterolateral arthrodesis at the level of C4-C5.  The patient continues to      improve neurologically.                                         Page 1 of 2      Darlene Landry, Darlene Landry      MRN:          [id number]      Account:      [id number]      Document ID:  [id number]      Service Date: 02/06/2011            PLAN:      The patient will complete her outpatient course of physical therapy and      follow up with me in 4 months.  She may begin to taper off the Neurontin      under the guidance of Dr. Nile Riggs.  I have given my consent for the patient      to begin some range of motion with respect to her neck outside of the      collar.                        Electronic Signing Provider            D:  02/06/2011 16:47 PM by Dr. Irene Shipper. Deloria Lair, MD ([id number])      T:  02/08/2011 15:51 PM by [id number]                  cc:                                   Page 2 of 2      Authenticated by Irene Shipper. Deloria Lair, MD ([id number]) On 02/15/2011 09:17:28 AM

## 2011-08-21 NOTE — Progress Notes (Signed)
HELI, DINO      MRN:          [id number]      Account:      [id number]      Document ID:  [id number]      Service Date: 11/07/2010            Admit Date: 11/07/2010            Patient Location: DISCHARGED 11/08/2010      Patient Type: O            PHYSICIAN/PROVIDER: Marlaine Hind MD                  HISTORY OF PRESENT ILLNESS:      The patient presents for followup.  She is status post anterior C4 through      C7 ACDF followed by posterior cervical laminectomy and C4-C5 fusion for a      central spinal cord injury.  She is in rehabilitation right now at [facility] and she has been progressing there.  Her surgery was      October 05, 2010.  She has noted some progress.  She is ambulating with      assistance at [facility].            RADIOGRAPHIC STUDIES:      X-rays indicate good position of instrumentation and hardware, and normal      alignment of the spine with cervical lordosis.            PHYSICAL EXAMINATION:      VITAL SIGNS:  She is afebrile, vital signs are stable.      SKIN:  Wounds are well healed.      NEUROLOGIC:  Deltoid is 4- on the right, 4 on the left; biceps 4- on the      right, 4 on the left; triceps 2 on the right, 4- on left; grip strength 2      on the right, 4 on the left; psoas is 2 on the right, 3 on the left;      quadriceps is 3 on the left, the right was not assessed secondary to her      recent knee replacement surgery; dorsiflexion on the left side was 4-, and      plantar flexion was 4- on the right side; it was 2 dorsiflexion, plantar      flexion.  Sensory is present but diminished on the patient's right side      upper and lower extremities, worse distal in the upper extremity than      proximal.            IMPRESSION:      The patient is status post spinal cord injury status post anterior cervical      diskectomy and fusion C4 to C7, and cervical laminectomy C4 to C7, and open      reduction and internal fixation for left C4-C5  facet fracture-dislocation.            PLAN:      The patient is doing well.  I would like to keep her in the collar until      mid February.  She should continue with the physical therapy and follow up      with me thereafter.  At that time, I would begin to taper out of the      cervical collar.  She will have updated films when she sees me in February.                                               Page 1 of 2      KIMA, MALENFANT      MRN:          [id number]      Account:      [id number]      Document ID:  [id number]      Service Date: 11/07/2010                  Electronic Signing Provider            D:  11/07/2010 13:53 PM by Dr. Irene Shipper. Del[place] Lair[place] ([id number])      T:  11/08/2010 16:47 PM by [email or web address]                  cc:                                   Page 2 of 2      Authenticated by Irene Shipper. Deloria Lair, MD ([id number]) On 12/06/2010 07:34:52 PM

## 2011-09-07 NOTE — Progress Notes (Signed)
Darlene Landry, WEINMANN      MRN:          [id number]      Account:      [id number]      Document ID:  [id number]      Service Date: 06/05/2011            Admit Date: 06/05/2011            Patient Location: DISCHARGED 06/06/2011      Patient Type: O            PHYSICIAN/PROVIDER: Marlaine Hind MD                  DATE OF BIRTH:      Jun 30, 1941.            HISTORY OF PRESENT ILLNESS:      The patient is a 70 year old woman status post C4 to C7 ACDF followed by a      posterior cervical laminectomy with a C4-C5 posterolateral fusion for a      central cord spinal cord injury.  The patient continues to make significant      progress.  Currently, she is ambulating independently.  Her legs are much      stronger.  She still has significant weakness, particularly on the right      upper extremity distally.  Numbness persists in the bilateral hands.  She      speaks very highly of the physical therapy that she is participating in at      [facility] at [facility] clinic.  The work that they have been able      to do to help her continues to improve.            PHYSICAL EXAMINATION:      She has deltoid strength which is 4- on the right, 4+ on the left.  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  Triceps is 5/5 bilaterally, biceps is 3 on the right,      4+ on the left.  Grip is 2 on the right, 4+ on the left.  The right lower      extremity is 4+ for the psoas, quadriceps, dorsiflexion, plantar flexion.      Left lower extremity is 5 in psoas, quadriceps, dorsiflexion, plantar      flexion.  She has decreased sensation in bilateral hands, right worse than      left to light touch and pinprick and she has an intense tingling in the      left hand.  Deep tendon reflexes are hyperreflexic for the brachioradialis,      +3, and for the biceps and triceps +3 as well.  She has a positive Hoffmann      bilaterally.            IMPRESSION:      Spinal cord  injury, central cord syndrome, status post a C4 to C7 anterior      cervical diskectomy with fusion, posterolateral laminectomy with      posterolateral arthrodesis at C4-C5.  The patient had a facet fracture      prompting the posterolateral fusion at C4-C5.            PLAN:      Overall, the patient is doing very well.  I would like to see  her back in 4      months.  In the interim she is encouraged to continue with her physical      therapy.                                   Page 1 of 2      [name], [name]      MRN:          [id number]      Account:      [id number]      Document ID:  [id number]      Service Date: 06/05/2011                              Electronic Signing Provider            D:  06/05/2011 15:01 PM by Dr. Irene Shipper. Deloria Lair, MD ([id number])      T:  06/05/2011 15:44 PM by [id number]                  cc:                                   Page 2 of 2      Authenticated by Irene Shipper. Deloria Lair, MD ([id number]) On 06/21/2011 06:39:47 PM

## 2011-10-08 ENCOUNTER — Encounter (INDEPENDENT_AMBULATORY_CARE_PROVIDER_SITE_OTHER): Payer: Self-pay | Admitting: Neurological Surgery

## 2011-10-09 ENCOUNTER — Ambulatory Visit (INDEPENDENT_AMBULATORY_CARE_PROVIDER_SITE_OTHER): Payer: Medicare Other | Admitting: Neurological Surgery

## 2011-10-09 ENCOUNTER — Encounter (INDEPENDENT_AMBULATORY_CARE_PROVIDER_SITE_OTHER): Payer: Self-pay | Admitting: Neurological Surgery

## 2011-10-09 VITALS — BP 115/65 | HR 80 | Temp 96.0°F | Wt 170.0 lb

## 2011-10-09 DIAGNOSIS — M4802 Spinal stenosis, cervical region: Secondary | ICD-10-CM

## 2011-10-09 NOTE — Progress Notes (Signed)
Review of Systems   Constitutional: Positive for unexpected weight change.

## 2011-10-09 NOTE — Progress Notes (Addendum)
[facility] Neurosurgery  Follow up Note    HPI   Patient is 70 year old female who presents for routine follow up.   She had surgery approximately one year.   PREOPERATIVE DIAGNOSES:   1. C4-C5 left facet dislocation, with spinal instability.   2. Cervical spinal cord injury, incomplete, with central cord syndrome.   3. 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   POSTOPERATIVE DIAGNOSES:   TITLES OF PROCEDURES:   1. C4-C5 open reduction, internal fixation of a left cervical facet   dislocation/   2. Left C4-C5 posterior nonsegmental fixation, with C4-C5 posterior   lateral arthrodesis.   3. C4-C5, C6-C7 cervical laminectomies.     She reports she is doing great. Performs PT exercises every day. Still has some limited range of motion in right UE, and she is working with Dr. Nile Riggs at [facility].   Occasionally has pain in right arm (shoulder to elbow), but has decreased.   Reports two recent cortisone injections in right shoulder.   Notes tingling in bilateral hands with numbness, but denies any hand pain. Right hand remains weak.   She is receiving Botox therapy in the right forearm and neck with significant improvement.   She is able to ambulate with cane, at home she uses it intermittently. She is able to walk     Chief Complaint   Patient presents with   . Follow-up     C4-5 Instability     Physical Examination   VITAL SIGNS:   weight is 77.111 kg (170 lb). Her temperature is 96 F (35.6 C). Her blood pressure is 115/65 and her pulse is 80.        Neurologic Exam     Mental Status   Oriented to person, place, and time.       Speech: speech is normal   Level of consciousness: alert  Knowledge: good.   Normal comprehension.     Cranial Nerves   Cranial nerves II through XII intact.     Motor Exam   Muscle bulk: normal  Overall muscle tone: normal     Strength   Strength 5/5 throughout.   Right deltoid: 3/5  Right triceps: 4/5  Right anterior tibial: 4/5  Right posterior tibial: 4/5       Right grip 4/5  Left  grip 5/5   Right 4th and 5th digits with mild contracture         Sensory Exam   Light touch normal.   Pinprick normal.     Gait, Coordination, and Reflexes      Gait  Gait: (slowed)     Coordination   Romberg: negative      Review of Systems   Review of Systems    Radiology Interpretation   None new.     Impression & Plan     1. Spinal stenosis in cervical region      Current Outpatient Prescriptions   Medication Sig Dispense Refill   . amLODIPine (NORVASC) 2.5 MG tablet Take 2.5 mg by mouth daily.         Marland Kitchen aspirin 81 MG tablet Take 81 mg by mouth daily.         . B Complex-Biotin-FA (B-COMPLEX PO) Take by mouth daily.         . Biotin 1000 MCG tablet Take 1,000 mcg by mouth daily.         . Coenzyme Q10 (COQ-10) 200 MG CAPS Take by mouth 2 (  two) times daily.         Marland Kitchen ezetimibe-simvastatin (VYTORIN) 10-20 MG per tablet Take 1 tablet by mouth nightly.         . furosemide (LASIX) 20 MG tablet Take 20 mg by mouth daily.         Marland Kitchen gabapentin (NEURONTIN) 300 MG capsule Take 300 mg by mouth 2 (two) times daily.         Marland Kitchen levothyroxine (SYNTHROID, LEVOTHROID) 50 MCG tablet Take 50 mcg by mouth daily.         Marland Kitchen losartan (COZAAR) 100 MG tablet Take 100 mg by mouth daily.         . Magnesium 100 MG CAPS Take by mouth.         . Multiple Vitamins-Minerals (CENTRUM SILVER PO) Take by mouth daily.         . Omega-3 Fatty Acids (FISH OIL) 1200 MG CAPS Take by mouth 2 (two) times daily.         Marland Kitchen PARoxetine (PAXIL) 10 MG tablet Take 10 mg by mouth every morning.         Marland Kitchen spironolactone (ALDACTONE) 25 MG tablet Take 25 mg by mouth daily.         . vitamin B-12 (CYANOCOBALAMIN) 500 MCG tablet Take 500 mcg by mouth daily.           Spent counseling time more than 50% of visit: 30 minutes    Follow-up   Return in about 1 year (around 10/08/2012).  Continue PT/OT.     Marlaine Hind, MD  I have reviewed the notes and examined the patient with and performed by Crist Infante NP, I concur with her/his documentation of Darlene Landry.    Dr. Marlaine Hind

## 2011-10-10 ENCOUNTER — Encounter (INDEPENDENT_AMBULATORY_CARE_PROVIDER_SITE_OTHER): Payer: Self-pay | Admitting: Neurological Surgery

## 2012-03-04 ENCOUNTER — Ambulatory Visit
Admission: RE | Admit: 2012-03-04 | Discharge: 2012-03-04 | Disposition: A | Discharge status: Home / Self Care | Payer: Medicare Other | Source: Ambulatory Visit | Attending: Physical Medicine & Rehabilitation | Admitting: Physical Medicine & Rehabilitation

## 2012-03-04 DIAGNOSIS — G8253 Quadriplegia, C5-C7 complete: Secondary | ICD-10-CM

## 2012-03-04 DIAGNOSIS — R262 Difficulty in walking, not elsewhere classified: Secondary | ICD-10-CM | POA: Insufficient documentation

## 2012-03-04 NOTE — Consults (Addendum)
[facility]   [address]  [place], [place] [address]  Tel [phone]  Fax [phone]    PHYSICAL THERAPY EVALUATION      PATIENT: Darlene Landry   Referred By: Everlean Alstrom,*  DOB: 05-04-1941    Medical Record #: [id number]  AGE: 71 y.o.     Start of Care: 03/04/2012    Date of onset: 02/17/12   Dates of Certification: 03/04/2012 to 05/19/12   Facility Provider #: [phone]   HICN#: Medicare Sub. Num: [id number]    Diagnosis: SCI 344.01  Therapy Diagnosis: 726.2, 719.41, 719.7    Start Time: 1400 to Stop Time: 1500    Consult received for Darlene Landry for PT Evaluation and Treatment.  Patient's medical condition is appropriate for Physical therapy intervention at this time.    Precautions and Contraindications: falls risk, h/o falls    History of Present Illness: Darlene Landry is a 71 y.o. female presents to outpatient therapy with history of spinal cord injury.  Patient had been seen in outpatient therapy until Nov of 2012.  Given HEP at that time.  Now returns to outpatient PT with concern of right UE tightness/weakness, s/p recent fall X 3 in April tripping over objects.  H/o  cortisone injections (most recent in February), botox injections in wrist/elbow/pec 2 weeks ago.    Past Medical/Surgical History:  Past Medical History   Diagnosis Date   . Hypertensive disorder    . Hypothyroidism    . Hyperlipidemia    . Spinal cord injury without spinal bone injury    . Cervical stenosis of spinal canal       Past Surgical History   Procedure Date   . Hysterectomy 1995   . Joint replacement 09/19/2010     right knee       Social History:  Lives with husband, stairs with rail, walker, quad cane, wheelchair    Patient Goal: To get my right shoulder looser so I can reach for things in the kitchen.      Subjective: Patient is agreeable to participation in the therapy session.     Objective:  Pain:  8/10 at worst, 0/10 at best in right shoulder, increased with activity/exercises/range of  motion.    Sensation: decreased throughout bilateral hands.  Tender to palpation right middle deltoid and throughout right upper quadrant from clavicle to scapula.  Tone: increased flexor tone right UE with mixed tone in fingers.    Posture/Joint mobility: right scapula in elevated with upward rotation.  Bilateral rounded shoulders right greater than left.  Decreased inferior and posterior glides right humerus.  Decreased glide on right clavicle in all ranges.    PROM:    Right UE - shoulder flexion 95, external rotation 35, abduction 95 - in supine.  Left UE - shoulder flexion 150, remainder of shoulder WFL.    Strength:  AROM:  Right shoulder flexion 65, abduction 70, external rotation 30 - seated  AROM:  Right shoulder flexion 80 with difficulty maintaining elbow extension, abduction 80, external rotation 15    Functional Mobility: supine to sit with Min A secondary to decreased right UE strength  DASH: score = 72.  A higher score (0-100) indicates greater disability.    Ambulation: ambulates with SC with increased BOS, decreased trunk rotation, decreased UE swing, occasional decreased foot clearance resulting in difficulty stepping over objects.     Participation and Activity Tolerance good    Treatment Activities: Educated the  patient to role of physical therapy, risks and benefits, plan of care, goals of therapy and Home Exercise Program to include: inferior glide with right UE in abduction.  To continue with prior HEP which we will review next session.  Treatment today of functional mobs seated and supine for inferior glide and posterior glide right humerus as well as gapping of right clavicle/scapula.    Assessment: Darlene Landry is a 71 y.o. female presenting to outpatient therapy with decreased sensation, decreased motor control, decreased range of motion, decreased static/dynamic balance, increased pain and increased tone resulting in decreased safety and independence with functional mobility/ADL.   Patient requires skilled Physical Therapy intervention to address the above deficits to maximize safety with mobility and return to prior level of function.    Rehabilitation Potential: good for goals below.    G Codes: Current [id number] with modifier of CL based on the DASH.  Goal [phone] with modifier of CK based on the DASH.    Plan:  Therapeutic Activities, Patient/Caregiver Education and Training, Therapeutic Exercise, Location manager, Neuromuscular Re-education, Functional Mobility Training, Home Exercise Program, Manual Therapy, and Electrical Stimulation.    Frequency of treatment:  2 times per week for 12 weeks.    Short Term Goals:  6 weeks  1) Independent with initial home exercise program.  2) Increase right shoulder flexion PROM to 105 in supine to facilitate reaching into cabinet.  3) Increase right shoulder external rotation PROM to 45 in supine to facilitate donning of jacket/shirt.  4) Decrease score on the DASH by 5 points to demonstrate progress toward functional mobility with right UE.    Long Term Goals: 12 weeks   1) Increase AROM right shoulder flexion to 110 in sitting/standing to facilitate reaching into cabinet.  5) Increase right shoulder flexion PROM to 150 in supine to facilitate reaching into cabinet.  6) Increase right shoulder external rotation PROM to 70 in supine to facilitate donning of jacket/shirt.  7) Decrease score on the DASH by 20 points to demonstrate progress toward functional mobility with right UE.    Therapist's Signature:  Earney Hamburg, PT, DPT, NCS     [place] # [phone]           Physician's Signature:      Date:

## 2012-03-07 ENCOUNTER — Emergency Department: Payer: Medicare Other

## 2012-03-07 ENCOUNTER — Emergency Department
Admission: EM | Admit: 2012-03-07 | Discharge: 2012-03-07 | Disposition: A | Discharge status: Home / Self Care | Payer: Medicare Other | Attending: Emergency Medicine | Admitting: Emergency Medicine

## 2012-03-07 DIAGNOSIS — E039 Hypothyroidism, unspecified: Secondary | ICD-10-CM | POA: Insufficient documentation

## 2012-03-07 DIAGNOSIS — R197 Diarrhea, unspecified: Secondary | ICD-10-CM | POA: Insufficient documentation

## 2012-03-07 DIAGNOSIS — I10 Essential (primary) hypertension: Secondary | ICD-10-CM | POA: Insufficient documentation

## 2012-03-07 DIAGNOSIS — R55 Syncope and collapse: Secondary | ICD-10-CM | POA: Insufficient documentation

## 2012-03-07 DIAGNOSIS — E785 Hyperlipidemia, unspecified: Secondary | ICD-10-CM | POA: Insufficient documentation

## 2012-03-07 DIAGNOSIS — Z91018 Allergy to other foods: Secondary | ICD-10-CM | POA: Insufficient documentation

## 2012-03-07 NOTE — Discharge Instructions (Signed)
You were seen by Noreene Larsson, MD today.    Follow up with Dr. Lorie Apley next week. Call Monday to schedule an appointment. Please take CD of CT scan with you to appointment.   Drink plenty of fluid.     Return to ER for lightheadedness, chest pain, heart racing, or any other concerns.

## 2012-03-07 NOTE — Consults (Signed)
NEUROSURGERY CONSULTATION    Date Time: 03/07/2012 12:01 PM  [name], Darlene Landry  Requesting Physician: Noreene Larsson, MD  Consulting Physician: Hermenia Bers, MD  Covered By: Neurosurgery  PA, Pager # [phone]    Reason for Consultation:   Abnormal HCT finding    History:   Darlene Landry is a 71 y.o. female who presents to the hospital on 03/07/2012 who had a single episode of diarrhea early this morning around 3AM followed by lightheadedness and dizziness that lasted about 15 minutes. She went to [facility] ED for evaluation and walked in. Her symptoms had resolved. A HCT was obtained, which showed a small hyperdensity in left frontal sulcus. She was transferred to [facility] ED for further evaluation and NS was consulted.     She is s/p a multilevel anterior/posterior c-spine decompression and fusion by Dr. Deloria Lair in 2011. 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    No complaints at this time.    Past Medical History:     Past Medical History   Diagnosis Date   . Hypertensive disorder    . Hypothyroidism    . Hyperlipidemia    . Spinal cord injury without spinal bone injury    . Cervical stenosis of spinal canal        Past Surgical History:     Past Surgical History   Procedure Date   . Hysterectomy 1995   . Joint replacement 09/19/2010     right knee   C4-7 PSIF and ACDF    Family History:     Family History   Problem Relation Age of Onset   . Diabetes Mother    . Diabetes Father    . Diabetes Brother        Social History:     History     Social History   . Marital Status: Married     Spouse Name: N/A     Number of Children: N/A   . Years of Education: N/A     Social History Main Topics   . Smoking status: Never Smoker    . Smokeless tobacco: Never Used   . Alcohol Use: No   . Drug Use: No   . Sexually Active:      Other Topics Concern   . Not on file     Social History Narrative   . No narrative on file       Allergies:     Allergies   Allergen Reactions   . Nsaids  Swelling   . Pineapple        Medications:          Review of Systems:   A comprehensive review of systems was: Negative except see HPI    Physical Exam:     Filed Vitals:    03/07/12 1129   BP: 122/65   Pulse: 82   Temp:    Resp: 16   SpO2: 100%       Intake and Output Summary (Last 24 hours) at Date Time  No intake or output data in the 24 hours ending 03/07/12 1201    Neuro exam:    GCS: 15      Mental status:       Alert, awake, oriented x 3         Pleasant, smiling, cooperative  Eyes: open  Speech: fluent  Follow commands (y/n): y    Cranial Nerve:  Pupils  Pupil 3  mm           EOM: intact                Face: symmetric  Tongue: midline     Motor: LUE: deltoid 4+/5, bicep 4+/5, tricep 4/5, wrist flexion 4/5, wrist extension 4+/5, grip, 5/5. RUE-deltoid 4/5, bicep/tricep 3/5, wrist flexion/extension 4-/5, grip 4/5, intrinsics 2/5. Right hand contractures and limited shoulder ROM noted. 5/5 all levels BLEs.    Sensory: SILT all levels BUEs and BLEs    Reflexes/Babinski: 2/4 biceps, triceps, brachioradialis with inverted radial reflex present bilat. 2/4 knee jerks, 1/4 ankle jerks. Negative clonus.       Labs Reviewed:   Recent CBC No results found for this basename: WHITEBLOODCE,RBC,HGB,HCT,MCV,MCH,MCHC,RDW,MPV,LABPLAT, NRBCA, REFLX, ANRBA in the last 24 hours  Recent BMP No results found for this basename: GLU,BUN,CREAT,CA,NA,K,CL,CO2,AGAP in the last 24 hours    Rads:   Radiological Procedure reviewed. HCT on disk from [facility] shows a prominent likely cortical vein in the left central sulcus. There is a hypodense cortical region in the left frontal lobe. Both findings were present on previous HCT from 09/2011 in PACs system. No acute hemorrhage.    Assessment:   71 yo with near syncopal episode while sitting on the toilet having a BM. There is an incidental finding of a prominent cortical vein in the left central sulcus, stable in appearance from previous CT scan. No neurosurgical lesion is present. She does  have a hypodensity in the left frontal lobe. I reviewed the CT scan and case with Dr. Neita Garnet who developed the following plan.    Plan:   1. No neurosurgical lesion present  2. Medical evaluation for near-syncope  3. Recommend neurology consult for hypodensity noted in left frontal lobe  4. Please call or reconsult as neededwith any further questions.    Signed by: Jennelle Human, PA-C  Date/Time: 03/07/2012 12:01 PM

## 2012-03-07 NOTE — ED Provider Notes (Addendum)
Physician/Midlevel provider first contact with patient: 64 (Dr. Zachary George)    History     Chief Complaint   Patient presents with   . Loss of Consciousness     Patient is a 71 y.o. female presenting with syncope. The history is provided by the patient and the spouse. No language interpreter was used.   Loss of Consciousness  This is a new problem. The current episode started today. Pertinent negatives include no abdominal pain, chest pain, headaches, nausea, visual change or vomiting.       71yo F h/o HTN, HLD, hypothyroidism, cervical stenosis, spinal cord injury (central cord symdrome s/p  anterior cervical diskectomy w fusion),  baseline extremity paresthesias s/p cord injury in 2011 BIBA tx from [facility] for abnl findings on head CT. Pt was initially seen at [facility] around 6AM for few episodes of witnessed momentary syncopal episodes in the bathroom after having a prolonged episode of diarrhea at 3 AM. Husband, who was with the pt ,noticed momentary slurred speech, which resolved. Husband sts that he tried to measure BP at that time, but was not able to get a reading. Pt reports feeling lightheaded and sweaty prior to the episode, but notes that sxs have resolved and that she feels fine at this time. Husband took pt to [facility] s/p incident where she had head CT done, which showed "small hyperdensity within a L frontal sulcus is more focal than typically seen with hemorrhage and may reflect a prominent cortical vein." Pt was then referred here for further eval. No hx of clot and no recent travel. Denies n/v, abd pain, CP, palpitation, SOB, new numbness/tingling, fall, trauma, HA, vision change, confusion, shaking, or any other concerns.     She feels well now.    PCP: Kela Millin, MD -> [facility]    Past Medical History   Diagnosis Date   . Hypertensive disorder    . Hypothyroidism    . Hyperlipidemia    . Spinal cord injury without spinal bone injury    . Cervical stenosis of spinal canal        Past  Surgical History   Procedure Date   . Hysterectomy 1995   . Joint replacement 09/19/2010     right knee       Family History   Problem Relation Age of Onset   . Diabetes Mother    . Diabetes Father    . Diabetes Brother        Social  History   Substance Use Topics   . Smoking status: Never Smoker    . Smokeless tobacco: Never Used   . Alcohol Use: No       Allergies   Allergen Reactions   . Nsaids Swelling   . Pineapple        Current/Home Medications    AMLODIPINE (NORVASC) 2.5 MG TABLET    Take 2.5 mg by mouth daily.      ASPIRIN 81 MG TABLET    Take 81 mg by mouth daily.      B COMPLEX-BIOTIN-FA (B-COMPLEX PO)    Take by mouth daily.      BIOTIN 1000 MCG TABLET    Take 1,000 mcg by mouth daily.      COENZYME Q10 (COQ-10) 200 MG CAPS    Take by mouth 2 (two) times daily.      EZETIMIBE-SIMVASTATIN (VYTORIN) 10-20 MG PER TABLET    Take 1 tablet by mouth nightly.      FUROSEMIDE (  LASIX) 20 MG TABLET    Take 20 mg by mouth daily.      GABAPENTIN (NEURONTIN) 300 MG CAPSULE    Take 300 mg by mouth 2 (two) times daily.      LEVOTHYROXINE (SYNTHROID, LEVOTHROID) 50 MCG TABLET    Take 50 mcg by mouth daily.      LOSARTAN (COZAAR) 100 MG TABLET    Take 100 mg by mouth daily.      MAGNESIUM 100 MG CAPS    Take by mouth.      MULTIPLE VITAMINS-MINERALS (CENTRUM SILVER PO)    Take by mouth daily.      OMEGA-3 FATTY ACIDS (FISH OIL) 1200 MG CAPS    Take by mouth 2 (two) times daily.      PAROXETINE (PAXIL) 10 MG TABLET    Take 10 mg by mouth every morning.      SPIRONOLACTONE (ALDACTONE) 25 MG TABLET    Take 25 mg by mouth daily.      VITAMIN B-12 (CYANOCOBALAMIN) 500 MCG TABLET    Take 500 mcg by mouth daily.          Review of Systems   Constitutional:        +sweating resolved   Eyes: Negative for visual disturbance.   Respiratory: Negative for shortness of breath.    Cardiovascular: Positive for syncope. Negative for chest pain and palpitations.   Gastrointestinal: Positive for diarrhea. Negative for nausea, vomiting and  abdominal pain.   Neurological: Positive for syncope and light-headedness. Negative for headaches.        +slurred speech resolved   Psychiatric/Behavioral: Negative for confusion.   All other systems reviewed and are negative.        Physical Exam    BP 117/60  Pulse 70  Temp 96.1 F (35.6 C)  Resp 16  Ht 1.575 m  Wt 80.74 kg  BMI 32.55 kg/m2  SpO2 99%    Physical Exam   Nursing note and vitals reviewed.  Constitutional: She is oriented to person, place, and time. She appears well-developed and well-nourished. No distress.   HENT:   Head: Normocephalic and atraumatic.   Right Ear: External ear normal.   Left Ear: External ear normal.   Mouth/Throat: Oropharynx is clear and moist. No oropharyngeal exudate.        tms' gray au   Eyes: Conjunctivae and EOM are normal. Pupils are equal, round, and reactive to light. Right eye exhibits no discharge. Left eye exhibits no discharge.   Neck:        nt c spine, no carotid bruits   Cardiovascular: Normal rate, regular rhythm and intact distal pulses.  Exam reveals no gallop and no friction rub.    No murmur heard.  Pulmonary/Chest: Effort normal and breath sounds normal. No respiratory distress. She has no wheezes. She has no rales.   Abdominal: Soft. Bowel sounds are normal. There is no tenderness.   Musculoskeletal: She exhibits no edema and no tenderness.        nt c t ls spine. No bony extremity tenderness   Neurological: She is alert and oriented to person, place, and time. She has normal reflexes. No cranial nerve deficit.        Bic/tric 4+/5 r, 4/5 left  Grips 5/5 bilat  Finger extension 5/5 r, able to extend thumb thru middle fingers actively. Ring/sm finger remain flexed but able to extend passively  Dorsi/plantar flex feet, 5/5 quads 4/5 bilat  dtr's 2+  sens intact    Pt reprots that her strength and sens, reflexs are all unchanged from her baseline   Skin: Skin is warm and dry. She is not diaphoretic.       MDM and ED Course     Sent for eval of findings  on CT head from [facility]--after syncopal episode.  CD of scan w patient   labs from [facility] reviewed,   check ekg--no tracing w paperwork   monitor while in the ED    ED Medication Orders     None           MDM      Procedures    Clinical Impression & Disposition     Clinical Impression  Final diagnoses:   Syncope   Diarrhea        ED Disposition     Discharge Kallie Locks discharge to home/self care.    Condition at discharge: Stable             New Prescriptions    No medications on file      _____________________________________________________________________  First MD: Noreene Larsson, MD    I, Kerry Fort, am scribing for Noreene Larsson, MD on Darlene Landry (ED Scribe)  10:33 AM  03/07/2012    I personally performed the services documented. Jinmin Saint Martin is scribing for me on Landry,Darlene E. I reviewed and confirm the accuracy of the information in this medical record.   Noreene Larsson, MD  03/07/2012  _____________________________________________________________________    Treatment Team: Scribe: Kerry Fort  All labs from [facility] sent w patient were reviewed.    Cd of pt's ct scan taken at [facility] brought to neuro radiology, dr Gregary Signs, compared to old ct scan head--hyperdenselesion was present previously.      EKG interpreted by EDP: nsr at 68  nsst wave changes. No sig change from ekg 09/30/10    2:12 PM  Seen by NS and scans reviewed.  They were concerned re: 2 hypodense lesions seen in L frontal cortex and recommended adm and neurology eval.  And we discussed adm for cardiac monitoring as well in light of syncope. D/w pt and husband.  Pt declines hospitalization.  She understands that the etiology of her syncope at 6am today is unclear, could be related to prolonged bout of diarrhea but could also be caused by arrhythmia or other potentially life-threatening event. Pt and husband voiced understanding. They understand that they may return at any time for eval. We discussed earlier return to  ED by ems if sx recur or has any concerns.    Call to PMD pending.  Pt neurologist:Dr Reina Fuse, MD  03/07/12 2259    Noreene Larsson, MD  03/08/12 [phone]

## 2012-03-07 NOTE — ED Notes (Signed)
Pt experienced one episode of diarrhea at 3am, while sitting on the toilet pt experienced two syncopal episodes. Pt was caught by husband both times and did not experience a fall. Per husband, pt experienced about one hour of slurred speech and BP systolic was in the 80s. Pt taken to [facility] ED and was sent here due to abnormal CT findings. Pt is at baseline, denies pain, denies dizziness. Pt A0x4

## 2012-03-07 NOTE — ED Notes (Signed)
Stroke RN called at [facility]

## 2012-03-07 NOTE — ED Notes (Signed)
MD Alphonsus Sias and Stroke RN Mauree at bedside

## 2012-03-07 NOTE — Progress Notes (Addendum)
Pt transferred from [facility] due to abnormal findings on the CT scan. She had a syncopal episode x 2 this am at 0300 while on the commode and in the shower per her husband. Her BP was reading in the 80s. PMH: HTN, hypthyroidism, HLD, and cervical fx with residual deficits on the R side. She has weakness in the R arm and leg and numbness to bilateral feet. These findings are all baseline. The CT scan was initially thought to be a hemorrhage vs possible cortical vein. She is to possibly have more imaging, but does not meet tPA criteria secondary to onset and resolving symptoms. D/W ED MD. Will await results of diagnostic tests to determine POC.

## 2012-03-08 LAB — ECG 12-LEAD
Atrial Rate: 68 {beats}/min
P Axis: 38 degrees
P-R Interval: 146 ms
Q-T Interval: 418 ms
QRS Duration: 86 ms
QTC Calculation (Bezet): 444 ms
R Axis: 23 degrees
T Axis: 18 degrees
Ventricular Rate: 68 {beats}/min

## 2012-03-09 ENCOUNTER — Ambulatory Visit
Admission: RE | Admit: 2012-03-09 | Discharge: 2012-03-09 | Disposition: A | Discharge status: Home / Self Care | Payer: Medicare Other | Source: Ambulatory Visit

## 2012-03-09 NOTE — Progress Notes (Signed)
[facility]  Physical Therapy Daily Note    Date: 03/09/2012  Patient: Darlene Landry  MR#: [id number]  Visit #: 2/10 (KX at 67, POC by 05/19/12)    Start Time: 1300 to Stop Time: 1400    Total Treatment Time: 54    Treatment Code # of Minutes Treatment Code untimed   Therapeutic Exercise 14 PT Evaluation    Neuromuscular Re-Ed 24 PT Re-Evaluation    Manual Therapy 16 Canalith Repositioning    Therapeutic Activities              Subjective: Patient is agreeable to participation in the therapy session.   Can we go over my old exercises.    Objective  Therapeutic Exercise:  Combination of isotonics for increased shoulder flexion/abd/ER/IR, isometric holds at end range for flexion/abd/ER/IR, reversals for shoulder flex/ext, IR/ER, end range PNF holds for right UE with focus on scapular stability.  Manual stretch with contract relax right UE for increased shoulder flexion/abd/ER/IR.    Manual Therapy:  Functional mobilization supine to increase shoulder flexion/abd/ER.  Mobilization right clavicle grade II to facilitate shoulder flexion.  Soft tissue work/MFR for increased flexion/abd/ER/IR and decreased complaints of pain in right shoulder.     Neuromuscular Rehabilitation:  PNF scapular patterns with focus on anterior elevation/posterior depression - rhythmic initiation, reversals, combination of isotonics, isometric holds at end range.  Focus throughout on scapular stability and strength.  Postural alignment activities in sitting and standing.    Education/Home Exercise Program:  Reviewed all current HEP (deleted/clarified techniques)     Patient Requires Continued Skilled Care to: Patient requires continued skilled Physical Therapy intervention to focus on ROM/pain/strength to increase patients safety and independence with ADL.    Plan: continue with further treatment to focus on bed mobility, ADL's, UE functioning, pain management, home exercise program  Continue per above Plan of care    Therapist  Signature: Earney Hamburg, PT, DPT, NCS     [place] # [phone]

## 2012-03-11 ENCOUNTER — Ambulatory Visit
Admission: RE | Admit: 2012-03-11 | Discharge: 2012-03-11 | Disposition: A | Discharge status: Home / Self Care | Payer: Medicare Other | Source: Ambulatory Visit

## 2012-03-11 NOTE — Progress Notes (Signed)
[facility]  Physical Therapy Daily Note    Date: 03/11/2012  Patient: Darlene Landry  MR#: [id number]  Visit #: 3/10 (KX at 49, POC by 05/19/12)    Start Time: 1300 to Stop Time: 1400    Total Treatment Time: 54    Treatment Code # of Minutes Treatment Code untimed   Therapeutic Exercise 20 PT Evaluation    Neuromuscular Re-Ed 10 PT Re-Evaluation    Manual Therapy 24 Canalith Repositioning    Therapeutic Activities              Subjective: Patient is agreeable to participation in the therapy session.   Reports compliance with HEP.    Objective  Therapeutic Exercise:   Combination of isotonics for increased shoulder flexion/abd/ER/IR, isometric holds at end range for flexion/abd/ER/IR, reversals for shoulder flex/ext, IR/ER, end range and mid range PNF holds for right UE with focus on scapular stability.   Manual stretch with contract relax right UE for increased shoulder flexion/abd/ER/IR.   Manual Therapy:   Functional mobilization supine and seated to increase shoulder flexion/abd/ER. Mobilization right clavicle grade II to facilitate shoulder flexion. Soft tissue work/MFR for increased flexion/abd/ER/IR and decreased complaints of pain in right shoulder.   Neuromuscular Rehabilitation:   PNF scapular patterns with focus on anterior elevation/posterior depression - rhythmic initiation, reversals, combination of isotonics, isometric holds at end range. Focus throughout on scapular stability and strength. Postural alignment activities in sitting and standing.    Education/Home Exercise Program:  As given previously    Assessment: After above treatment right UE PROM - shoulder flexion 100, external rotation 35, abduction 105 - in supine.    Progress Toward Functional Goals: progress toward STG #2 and #3  Continue STG/LTG    Patient Requires Continued Skilled Care to: Patient requires continued skilled Physical Therapy intervention to focus on UE ROM/strength to increase patients safety and independence  with ADL and allow increased balance assistance with ambulation.    Plan: continue with further treatment to focus on balance, gait, community mobility, bed mobility, transfers, ADL's, UE functioning, pain management, home exercise program  Continue per above Plan of care    Therapist Signature: Earney Hamburg, PT, DPT, NCS     [place] # [phone]

## 2012-03-18 ENCOUNTER — Ambulatory Visit
Admission: RE | Admit: 2012-03-18 | Discharge: 2012-03-18 | Disposition: A | Discharge status: Home / Self Care | Payer: Medicare Other | Source: Ambulatory Visit

## 2012-03-18 NOTE — Progress Notes (Signed)
[facility]  Physical Therapy Daily Note    Date: 03/18/2012  Patient: Darlene Landry  MR#: [id number]  Visit #: 4/10 (KX at 69, POC by 05/19/12)    Start Time: 1300 to Stop Time: 1400  Total Treatment Time: 54    Treatment Code # of Minutes Treatment Code untimed   Therapeutic Exercise 24 PT Evaluation    Neuromuscular Re-Ed 14 PT Re-Evaluation    Manual Therapy 16 Canalith Repositioning    Therapeutic Activities              Subjective: Patient is agreeable to participation in the therapy session.   Reports continued compliance with HEP    Objective  Therapeutic Exercise:   Combination of isotonics for increased shoulder flexion/abd/ER/IR, isometric holds at end range for flexion/abd/ER/IR, reversals for shoulder flex/ext, IR/ER, end range and mid range PNF holds for right UE with focus on scapular stability.   Manual stretch with contract relax right UE for increased shoulder flexion/abd/ER/IR.     Manual Therapy:   Functional mobilization supine and seated to increase shoulder flexion/abd/ER. Mobilization right clavicle grade II to facilitate shoulder flexion. Soft tissue work/MFR for increased flexion/abd/ER/IR and decreased complaints of pain in right shoulder.     Neuromuscular Rehabilitation:   PNF scapular patterns with focus on anterior elevation/posterior depression - rhythmic initiation, reversals, combination of isotonics, isometric holds at end range. Focus throughout on scapular stability and strength. Postural alignment activities in sitting and standing.    Education/Home Exercise Program:  As given previously    Assessment: After above treatment right UE PROM - shoulder flexion 100, external rotation 35, abduction 105 - in supine.    Progress Toward Functional Goals: progressing toward range of motion  Continue STG/LTG    Patient Requires Continued Skilled Care to: Patient requires continued skilled Physical Therapy intervention to focus on ROM to increase patients safety and  independence with ADL.    Plan: continue with further treatment to focus on balance, gait, community mobility, bed mobility, transfers, ADL's, UE functioning, pain management, home exercise program  Continue per above Plan of care    Therapist Signature: Earney Hamburg, PT, DPT, NCS     [place] # [phone]

## 2012-03-20 ENCOUNTER — Ambulatory Visit
Admission: RE | Admit: 2012-03-20 | Discharge: 2012-03-20 | Disposition: A | Discharge status: Home / Self Care | Payer: Medicare Other | Source: Ambulatory Visit

## 2012-03-20 NOTE — Progress Notes (Signed)
[facility]  Physical Therapy Daily Note    Date: 03/20/2012  Patient: NELEH MULDOON  MR#: [id number]  Visit #: 5/10 (KX at 19, POC by 05/19/12)      Start Time: 1305 to Stop Time: 1400    Total Treatment Time: 35'    Treatment Code # of Minutes Treatment Code untimed   Therapeutic Exercise 30' PT Evaluation    Neuromuscular Re-Ed 8' PT Re-Evaluation    Manual Therapy 15' Canalith Repositioning    Therapeutic Activities              Subjective: Patient is agreeable to participation in the therapy session.  Got back MRI results-supraspinatus tear on right. Will get further information on June 11.    Objective  Therapeutic Exercise:  Combination of isotonics for increased shoulder flexion/abd/ER/IR, isometric holds at end range for flexion/abd/ER/IR, reversals for shoulder flex/ext, IR/ER.  Manual stretch with contract relax right UE for increased shoulder flexion/abd/ER/IR/elbow extension.       Manual Therapy:  Functional mobilization supine and seated to increase shoulder flexion/abd/ER. Mobilization right clavicle grade II to facilitate shoulder flexion. Soft tissue work/MFR for increased flexion/abd/ER/IR and decreased complaints of pain in right shoulder.       Neuromuscular Rehabilitation:  PNF scapular patterns with focus on anterior elevation/posterior depression - rhythmic initiation, reversals, combination of isotonics, isometric holds at end range.       Education/Home Exercise Program:  Continue with current HEP    Assessment: After above treatment patient able to have PROM of ER 50 degrees, flexion 125 degrees with slight abduction/without abd 115, abd 105 yet continues to have difficulty with pain, limited strength.      Progress Toward Functional Goals: Good progress towards STG #2 and #3.  Continue STG/LTG    Patient Requires Continued Skilled Care to: Patient requires continued skilled Physical Therapy intervention to focus on right UE ROM to increase patients safety and independence with  ADLs.     Plan: continue with further treatment to focus on balance, gait, community mobility, bed mobility, transfers, ADL's, UE functioning, pain management, home exercise program    Continue per above Plan of care    Therapist Signature: [place], PT, DPT   [place] # [phone]

## 2012-03-20 NOTE — Consults (Signed)
[facility]   [address]  [place], [place] [address]  Tel [phone]  Fax [phone]    OCCUPATIONAL THERAPY EVALUATION      PATIENT: Darlene Landry    Referred By: Everlean Alstrom,*  DOB: Sep 14, 1941     Medical Record #: [id number]  AGE: 71 y.o.      Start of Care: 03/20/2012    Date of onset: 02/17/12    Facility Provider #: [id number]  Dates of Certification: 03/20/2012 to 06/20/12 HICN#: Medicare Sub. Num: [id number]    Diagnosis: SCI 344.01  Therapy Diagnosis: 344.01    Start Time: 1404 to Stop Time: 1500     Consult received for Darlene Landry for OT Evaluation and Treatment.  Patient's medical condition is appropriate for Occupational therapy intervention at this time.    Precautions and Contraindications:  falls risk, h/o falls, April tripped over cord, tripped on edge of bedspread, caught toe on item on floor in grocery store.    History of Present Illness: Most recent Botox for R hand 02/12/12 with Dr. Nile Riggs, targeting finger flexors.      Past Medical/Surgical History:  Past Medical History   Diagnosis Date   . Hypertensive disorder    . Hypothyroidism    . Hyperlipidemia    . Spinal cord injury without spinal bone injury    . Cervical stenosis of spinal canal       Past Surgical History   Procedure Date   . Hysterectomy 1995   . 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     Social History:  Resides in [place] [facility] with husband.    Local support includes husband, friends.  Work status/demands: homemaker.    Subjective: Patient is agreeable to participation in the therapy session.     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 Light touch significantly diminished in both hands but able to feel deep touch L hand palm/fingers and R hand but best ulnar distribution.    Vision: N/A    Pain: 0/10, Location: Reports pain at end range  shoulder flex being addressed by PT    Musculoskeletal Examination     Hand ROM: B hand PROM WNL.  boutinnerre deformities RF. LF.      AROM     Left Right   THUMB MP Flexion/Extension WNL WNL    IP Flexion/Extension 55-0 WNL    Radial Abduction WNL WNL    Palmar Abduction WNL WNL     INDEX MCP Flexion/Extension WNL WNL    PIP Flexion/Extension WNL -30 deg ext    DIP Flexion/Extension WNL WNL     LONG MCP Flexion/Extension WNL WNL    PIP Flexion/Extension WNL WNL    DIP Flexion/Extension WNL No active flex     RING MCP Flexion/Extension WNL hyperext    PIP Flexion/Extension WNL 110/-85    DIP Flexion/Extension WNL 45/-20     LITTLE MCP Flexion/Extension WNL hyperext    PIP Flexion/Extension WNL 110/-90    DIP Flexion/Extension WNL 46/-10     DASH: to be completed.    Fine Motor Skills:  9 hole peg test TBA; Box/Blocks Test R 20, L 43.    Tone: Increased flexor tone R hand though fluctuating in nature.    Strength:  Dynomometer  testing: Right 6# 7# (BNL), Left 30# 39#    Self Care:  AMPAC short form: Raw Score 18 Standardized score 34.27 with client unable to perform 12 of 15 tasks (tying shoes, sewing on button, cutting toenails, hanging wash on line at eye level, unscrewing lid off previously unopened jar, etc) and a lot of difficulty for remaining 3 items (washing indoor windows, etc.)  Meal Preparation: Reports partial participation in meal prep.    Functional Mobility:   Ambulates with cane, hx falls often related to tripping over/on something she did not see.    Assessment: Darlene Landry is a 71 y.o. female admitted with diagnosis of incomplete SCI presenting with  decreased ADL independence, decreased IADL independence,  decreased R UE ROM/function, significantly decreased both hand sensation, decreased functional mobility.  Patient requires skilled Occupational Therapy intervention to address the above deficits to maximize safety with mobility and return to prior level of function.    Rehabilitation  Potential: Good    G Codes: Current [id number] with modifier of CM based on AMPAC Daily Activity Outpatient short form.  Goal [phone] with modifier of CK based on AMPAC Daily Activity Outpatient short form.    Plan:  Therapeutic Activities, Patient/Caregiver Education and Training, Therapeutic Exercise, Neuromuscular Re-education, Functional Mobility Training, Home Exercise Program, Manual Therapy, and Neuro Muscular Electrical Stimulation, splinting.    Frequency of treatment:  2 times per week for 12 weeks.    Goals:     Short Term Goals:  6 weeks  1) Independent with adjusted home exercise program.  2) Increased ability to release objects as evidenced by releasing 5 functional items without A with use of splint to  manage boutinnere deformities.  3) Increase both grip strength approx 5# to work toward increased I opening containers.  4) Increased score on box and block test by 5 blocks to work toward increase I with ADL/IADL (buttons, tying  shoes, etc.) tasks noted on  AMPAC    Long Term Goals: 12 weeks   1)  Client will demonstrate increased bilateral hand function as evidenced by improved score on AMPAC short form to 8 of 15 items better than unable to complete (currently 3)  2) Client will consistently release items held in R hand including when placed on first shelf of cabinet.         Therapist's Signature:  Rudi Coco, [place], OTR/L     [place] # [phone]       Physician's Signature:      Date:

## 2012-03-23 ENCOUNTER — Ambulatory Visit
Admission: RE | Admit: 2012-03-23 | Discharge: 2012-03-23 | Disposition: A | Discharge status: Home / Self Care | Payer: Medicare Other | Source: Ambulatory Visit

## 2012-03-23 ENCOUNTER — Ambulatory Visit
Admission: RE | Admit: 2012-03-23 | Discharge: 2012-03-23 | Disposition: A | Discharge status: Home / Self Care | Payer: Medicare Other | Source: Ambulatory Visit | Attending: Physical Medicine & Rehabilitation | Admitting: Physical Medicine & Rehabilitation

## 2012-03-23 DIAGNOSIS — G8251 Quadriplegia, C1-C4 complete: Secondary | ICD-10-CM | POA: Insufficient documentation

## 2012-03-23 DIAGNOSIS — M25819 Other specified joint disorders, unspecified shoulder: Secondary | ICD-10-CM | POA: Insufficient documentation

## 2012-03-23 DIAGNOSIS — M25519 Pain in unspecified shoulder: Secondary | ICD-10-CM | POA: Insufficient documentation

## 2012-03-23 DIAGNOSIS — R262 Difficulty in walking, not elsewhere classified: Secondary | ICD-10-CM | POA: Insufficient documentation

## 2012-03-23 NOTE — Progress Notes (Signed)
[facility]  Physical Therapy Daily Note    Date: 03/23/2012  Patient: Darlene Landry  MR#: [id number]  Visit #: 6/10 (KX at 72, POC by 05/19/12)    Start Time: 1300 to Stop Time: 1400  Total Treatment Time: 54    Treatment Code # of Minutes Treatment Code untimed   Therapeutic Exercise 15 PT Evaluation    Neuromuscular Re-Ed 10 PT Re-Evaluation    Manual Therapy 29 Canalith Repositioning    Therapeutic Activities              Subjective: Patient is agreeable to participation in the therapy session.   Sees MD on 6/11 for MRI results/specifics/plan    Objective  Therapeutic Exercise:   Combination of isotonics for increased shoulder flexion/abd/ER/IR, isometric holds at end range for flexion/abd/ER/IR, reversals for shoulder flex/ext, IR/ER, end range and mid range PNF holds for right UE with focus on scapular stability.   Manual stretch with contract relax right UE for increased shoulder flexion/abd/ER/IR.     Manual Therapy:   Functional mobilization supine and seated to increase shoulder flexion/abd/ER. Mobilization right clavicle grade II to facilitate shoulder flexion. Soft tissue work/MFR for increased flexion/abd/ER/IR and decreased complaints of pain in right shoulder.     Neuromuscular Rehabilitation:   PNF scapular patterns with focus on anterior elevation/posterior depression - rhythmic initiation, reversals, combination of isotonics, isometric holds at end range. Focus throughout on scapular stability and strength. Postural alignment activities in sitting and standing.    Education/Home Exercise Program:  To add resisted shoulder abduction after abduction stretch in supine.    Assessment: After above treatment right UE PROM - shoulder flexion 105, external rotation 42, abduction 115 - in supine.    Progress Toward Functional Goals: progressing toward ROM goals  Continue STG/LTG    Patient Requires Continued Skilled Care to: Patient requires continued skilled Physical Therapy intervention to  focus on pain/range of motion to increase patients safety and independence with ADL.    Plan: continue with further treatment to focus on balance, gait, community mobility, ADL's, UE functioning, pain management, home exercise program  Continue per above Plan of care    Therapist Signature: Earney Hamburg, PT, DPT, NCS     [place] # [phone]

## 2012-03-23 NOTE — Progress Notes (Addendum)
Occupational Therapy Daily Note    Date: 03/23/2012  Patient: Darlene Landry  MR#: [id number]  Visit #: 2    Start Time: 1404 to Stop Time: 1500  Total Treatment Time: 53      Treatment Code # of Minutes Treatment Code untimed   Therapeutic Exercise 25 OT Evaluation    Neuromuscular Re-Ed  OT Re-Evaluation    Manual Therapy      Therapeutic Activities 15     Attended E-stim      Splint fitting 13         Subjective:  Patient is agreeable to participation in the therapy session.  "I guess I don't use my R arm to do that very much"    Objective  Therapeutic Exercise: Tx focused on strengthening exercises for R wrist and adjusted L hand gym to improve movement quality and improve strength needed for function. Client unable to maintain RF and LF on object when completing wrist ext holding it, tolerates less than 1/2#.  Trained client and spouse in adapted wrist ext exercise (neutral to ext) to improve ability to hold object, also to complete wrist ext without object 1-2 times a day 20 reps, and adjusted hand gym 10-15 reps, 1-2 times per day.     Therapeutic Activities: Client worked on grasp/release functional objects for 6 inch high surface to counter,unable to reach first shelf of counter.  Worked with and without splints, able to lift greater weight with splints compared to without, able to achieve greater finger ext with splints in place and use RF and LF to assist with gross grasp, without splints PIPs flex preventing A in holding objects.  Client with poor ability to maintain wrist ext with grasp release due to significant weakness (R 3+/5)    Orthotics/Splint Fitting/Training  Fitted for oval 8 splints for IF, RF, LF red area noted on PIP joint after approx 20 min wear and discomfort with squeezing tasks.  Recommend client order spring based Boutonniere deformity splint.    Education/Home Exercise Program: Learned nonuse of R hand noted, re-educated in importance of using. Client demonstrated accurate completion  of initial HEP.    Assessment/Progress Toward Functional Goals: Progress toward STGs #1 and #2.    Continue STG/LTG    Patient Requires Continued Skilled Care to: Increase R wrist strength, increase grasp/release skill with use of PIP splint, increase I with ADLs/IADLS, increase R hand use, increase B hand grip strength.    Plan: Continue plan of care with further treatment to include: ADLS, home/personal management, UE functioning, community re-integration, home exercise program, splinting, NMES as indicated..    Therapist Signature: Rudi Coco, MS, OTR/L     [place] # [phone]

## 2012-03-25 ENCOUNTER — Ambulatory Visit
Admission: RE | Admit: 2012-03-25 | Discharge: 2012-03-25 | Disposition: A | Discharge status: Home / Self Care | Payer: Medicare Other | Source: Ambulatory Visit | Attending: Physical Medicine & Rehabilitation | Admitting: Physical Medicine & Rehabilitation

## 2012-03-25 ENCOUNTER — Ambulatory Visit
Admission: RE | Admit: 2012-03-25 | Discharge: 2012-03-25 | Disposition: A | Discharge status: Home / Self Care | Payer: Medicare Other | Source: Ambulatory Visit

## 2012-03-25 DIAGNOSIS — R262 Difficulty in walking, not elsewhere classified: Secondary | ICD-10-CM | POA: Insufficient documentation

## 2012-03-25 NOTE — Progress Notes (Signed)
[facility]  Physical Therapy Daily Note    Date: 03/25/2012  Patient: Darlene Landry  MR#: [id number]  Visit #: 7/10 (KX at 33, POC by 05/19/12)    Start Time: 1400 to Stop Time: 1500  Total Treatment Time: 54    Treatment Code # of Minutes Treatment Code untimed   Therapeutic Exercise 22 PT Evaluation    Neuromuscular Re-Ed 8 PT Re-Evaluation    Manual Therapy 24 Canalith Repositioning    Therapeutic Activities              Subjective: Patient is agreeable to participation in the therapy session.   Reports pain in her back after twisting to reach for something    Objective  Therapeutic Exercise:   Combination of isotonics for increased shoulder flexion/abd/ER/IR, isometric holds at end range for flexion/abd/ER/IR, reversals for shoulder flex/ext, IR/ER, end range and mid range PNF holds for right UE with focus on scapular stability.   Manual stretch with contract relax right UE for increased shoulder flexion/abd/ER/IR.     Manual Therapy:   Functional mobilization supine and seated to increase shoulder flexion/abd/ER. Mobilization right clavicle grade II to facilitate shoulder flexion. Soft tissue work/MFR for increased flexion/abd/ER/IR and decreased complaints of pain in right shoulder.     Neuromuscular Rehabilitation:   PNF scapular patterns with focus on anterior elevation/posterior depression - rhythmic initiation, reversals, combination of isotonics, isometric holds at end range. Focus throughout on scapular stability and strength. Reaching activities in sitting forward and lateral with MC/A at scapula/shoulder.    Education/Home Exercise Program:  As given previously    Assessment: After above treatment right UE PROM - shoulder flexion 108, external rotation 45, abduction 115 - in supine.    Progress Toward Functional Goals: progress toward all short term goals.  Continue STG/LTG    Patient Requires Continued Skilled Care to: Patient requires continued skilled Physical Therapy intervention to  focus on range of motion/strength to increase patients safety and independence with ADL.    Plan: continue with further treatment to focus on balance, gait, community mobility, bed mobility, transfers, ADL's, UE functioning, pain management, home exercise program  Continue per above Plan of care    Therapist Signature: Earney Hamburg, PT, DPT, NCS     [place] # [phone]

## 2012-03-25 NOTE — Progress Notes (Signed)
Occupational Therapy Daily Note    Date: 03/25/2012  Patient: Darlene Landry  MR#: [id number]  Visit #: 3/17 (701 due July 2, visit 10)    Start Time: 1305 to Stop Time: 1400  Total Treatment Time: 53      Treatment Code # of Minutes Treatment Code untimed   Therapeutic Exercise 25 OT Evaluation    Neuromuscular Re-Ed  OT Re-Evaluation    Manual Therapy      Therapeutic Activities 18     Attended E-stim      splinting 10         Subjective: Patient is agreeable to participation in the therapy session.  Reports greater ability to complete exercises earlier in the day.  Reports completing HEP.    Objective  Therapeutic Exercise:  Client completed active wrist ext targeting wrist ext 10 reps X 2 sets. Worked on MP ext with oval 8 splints on IF and RF and wrist ext (neutral to approx 20 deg) rather than compensatory wrist flex to open fingers. Complete 10 reps MP ext with wrist flex manually prevented IP ext, full movement IF and RF (wearing oval 8 splints), decreased MF ext. Client better able to squeeze L hand gym this visit recommended adding an additional rubberband.    Therapeutic Activities:  Grasp/release objects from 1 side of table to the other, client able to maintain min wrist ext to grasp object but required mod manual A to maintain when releasing object.     Orthotics/Splint Fitting/Training: Limited wear of oval 8 splints tolerated as a result of splint length for client's fingers with base digging into crease at base of finger, smaller sizes will not fit circumference of fingers.  Alternate to be trialed next week.    Education/Home Exercise Program: Education regarding method for wrist ext exercise to target wrist ext only and impact of wrist flexion on her ability to open her fingers.    Assessment/Progress Toward Functional Goals: Progress toward STG 1 and 2.    Continue STG/LTG    Patient Requires Continued Skilled Care to: Increase R wrist strength, increase grasp/release skill with use of PIP splint,  increase I with ADLs/IADLS, increase R hand use, increase B hand grip strength.    Plan: Continue plan of care with further treatment to include:  ADLS, home/personal management, UE functioning, community re-integration, home exercise program, splinting, NMES as indicated. Next session: check hand gym resistance R, add MP ext to HEP, trial alternate splint for boutinniere deformities, grasp/release clothes to fold maintaining wrist ext, how was cutting with scissors from home.    Therapist Signature: Rudi Coco, MS, OTR/L     [place] # [phone]

## 2012-03-30 ENCOUNTER — Ambulatory Visit
Admission: RE | Admit: 2012-03-30 | Discharge: 2012-03-30 | Disposition: A | Discharge status: Home / Self Care | Payer: Medicare Other | Source: Ambulatory Visit

## 2012-03-30 NOTE — Progress Notes (Signed)
[facility]  Physical Therapy Daily Note    Date: 03/30/2012  Patient: Darlene Landry  MR#: [id number]  Visit #: 8/10 (KX at 83, POC by 05/19/12)    Start Time: 1300 to Stop Time: 1400  Total Treatment Time: 54    Treatment Code # of Minutes Treatment Code untimed   Therapeutic Exercise 34 PT Evaluation    Neuromuscular Re-Ed 10 PT Re-Evaluation    Manual Therapy 10 Canalith Repositioning    Therapeutic Activities              Subjective: Patient is agreeable to participation in the therapy session.   I can move it better to the side.    Objective  Therapeutic Exercise:   Combination of isotonics for increased shoulder flexion/abd/ER/IR, isometric holds at end range for flexion/abd/ER/IR, reversals for shoulder flex/ext, IR/ER, end range and mid range PNF holds for right UE with focus on scapular stability.   Manual stretch with contract relax right UE for increased shoulder flexion/abd/ER/IR.     Manual Therapy:   Functional mobilization supine and seated to increase shoulder flexion/abd/ER. Mobilization right clavicle grade II to facilitate shoulder flexion. Soft tissue work/MFR for increased flexion/abd/ER/IR and decreased complaints of pain in right shoulder.     Neuromuscular Rehabilitation:   PNF scapular patterns with focus on anterior elevation/posterior depression - rhythmic initiation, reversals, combination of isotonics, isometric holds at end range. Focus throughout on scapular stability and strength. Right UE flexion/abduction activities in sitting with MC/A/overpressure at scapula/shoulder.    Education/Home Exercise Program:  To continue with prior home exercise program.    Assessment: After above treatment right UE PROM - shoulder flexion108, external rotation 50, abduction 115 - in supine.    Progress Toward Functional Goals: met STG #1 and #2.  Continue STG/LTG    Patient Requires Continued Skilled Care to: Patient requires continued skilled Physical Therapy intervention to focus on  range of motion/strength/pain to increase patients safety and independence with ADL/functional activities.    Plan: continue with further treatment to focus on balance, gait, community mobility, bed mobility, transfers, ADL's, UE functioning, pain management, home exercise program  Continue per above Plan of care    Therapist Signature: Earney Hamburg, PT, DPT, NCS     [place] # [phone]

## 2012-03-30 NOTE — Progress Notes (Signed)
Occupational Therapy Daily Note    Date: 03/30/2012  Patient: Darlene Landry  MR#: [id number]  Visit #: 4/17 (701 July 2, visit 10)    Start Time: 1405 to Stop Time: 1503  Total Treatment Time: 55      Treatment Code # of Minutes Treatment Code untimed   Therapeutic Exercise 10 OT Evaluation    Neuromuscular Re-Ed  OT Re-Evaluation    Manual Therapy      Therapeutic Activities 15     Attended E-stim      Splinting fitting 30         Subjective: Patient is agreeable to participation in the therapy session. Reports completing HEP, most difficult picking things up and moving them.    Objective  Therapeutic Exercise:  Completed 10 reps x 2 wrist ext holding object with oval 8 splints able to partially maintain fingers on object without splints grasp is limited primarily to MF and thumb as IF, RF, LF become increasingly flexed at PIP.    Therapeutic Activities:  Worked on removing functional objects from kitchen drawer and replacing with and without splints, client with much improved ability to open hand for gross grasp of objects without splints IF,RF, and LF again move into increasing flex and are unable to A with grasp and release ability decreases.     Orthotics/Splint Fitting/Training: Alternate Boutinerre splint arrived maintained PIP ext but only minimal PIP flexion feasible, no greater amount than with oval-8 splint. Bulkiness also limiting to function.  Oval 8 padded slightly to decrease PIP joint pressure area with min to mod improvement.  Tolerance for splints generally 15-30 min depending on task being completed.       Education/Home Exercise Program: Continue    Assessment/Progress Toward Functional Goals: Add MP ext to HEP, determine correct sizes for oval 8 splints. STGs 1 and 2 met. Progressing toward 3.     Continue STG/LTG    Patient Requires Continued Skilled Care to: Increase R wrist strength, increase grasp/release skill with use of PIP splint, increase I with ADLs/IADLS, increase R hand use, increase  B hand grip strength.    Plan: Continue plan of care with further treatment to include:ADLS, home/personal management, UE functioning, community re-integration, home exercise program, splinting, NMES as indicated.   Next session: R grip strength test, folding clothes with and without splints, AMPAC items, determine oval 8 splint sizes.    Therapist Signature: Rudi Coco, MS, OTR/L     [place] # [phone]

## 2012-04-01 ENCOUNTER — Ambulatory Visit
Admission: RE | Admit: 2012-04-01 | Discharge: 2012-04-01 | Disposition: A | Discharge status: Home / Self Care | Payer: Medicare Other | Source: Ambulatory Visit

## 2012-04-01 ENCOUNTER — Ambulatory Visit: Payer: Medicare Other

## 2012-04-01 NOTE — Progress Notes (Addendum)
Occupational Therapy Daily Note    Date: 04/01/2012  Patient: Darlene Landry  MR#: [id number]  Visit #: 5/17 (701 July 2 visit 10)    Start Time: 1304 to Stop Time: 1400  Total Treatment Time: 43      Treatment Code # of Minutes Treatment Code untimed   Therapeutic Exercise  OT Evaluation    Neuromuscular Re-Ed  OT Re-Evaluation    Manual Therapy      Therapeutic Activities 45     Attended E-stim      splinting 8         Subjective: Patient is agreeable to participation in the therapy session.  Reports completing HEP.    Objective    Therapeutic Activities:  Worked on functional skills, folding towels and moving objects on table with vcs client able to maintain neutral to partial wrist ext for most tasks, wrist weakness evident as unable to maintain ext when lifting folded shirt to place in a pile.  Worked with and without Oval 8 splint with RF more involved in task with splint.    Orthotics/Splint Fitting/Training: Padded oval 8 splints at crease at the base of the finger with partial relief of discomfort.     Education/Home Exercise Program: Continue HEP. Discussed alternative splint options, will contact silver ring splint company to explore as well as contact local hand therapists for referral.     Assessment/Progress Toward Functional Goals: Client with improved ability to maintain wrist ext.  L grip 40 #, R unchanged. Client has met STG #1, with oval 8 splint release significantly improved #2 partially met with oval 8 benefiting but also causing discomfort.    Refer to hand therapist to fit for comfortable splint to manage PIP flexion deformity.    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    Plan:  Continue therapy with a hand therapy specialist as proper splinting for  deformities is outside this therapists' scope of practice and is needed to improve R hand functional use.  Recommended client contact the physician managing her shoulder and her physiatrist for recommendations, name of local hand therapist also provided.  Client may benefit from resumption of services at this clinic when hand specific needs are met.     Therapist Signature: Rudi Coco, MS, OTR/L     [place] # [phone]

## 2012-04-01 NOTE — Progress Notes (Signed)
[facility]  Physical Therapy Daily Note    Date: 04/01/2012  Patient: Darlene Landry  MR#: [id number]  Visit #: 9/10 (KX at 3, POC by 05/19/12)    Start Time: 1400 to Stop Time: 1500  Total Treatment Time: 54    Treatment Code # of Minutes Treatment Code untimed   Therapeutic Exercise 24 PT Evaluation    Neuromuscular Re-Ed 10 PT Re-Evaluation    Manual Therapy 20 Canalith Repositioning    Therapeutic Activities              Subjective: Patient is agreeable to participation in the therapy session.   Patient with 7 mm near full thickness tear of the anterior fibers of the supraspinatus tendon at tendon insertion.    Objective  Therapeutic Exercise:   Combination of isotonics for increased shoulder flexion/abd/ER/IR, isometric holds at end range for flexion/abd/ER/IR, reversals for shoulder flex/ext, IR/ER, end range and mid range PNF holds for right UE with focus on scapular stability.   Manual stretch with contract relax right UE for increased shoulder flexion/abd/ER/IR.     Manual Therapy:   Functional mobilization supine and seated to increase shoulder flexion/abd/ER. Mobilization right clavicle grade II to facilitate shoulder flexion. Soft tissue work/MFR for increased flexion/abd/ER/IR and decreased complaints of pain in right shoulder.     Neuromuscular Rehabilitation:   PNF scapular patterns with focus on anterior elevation/posterior depression - rhythmic initiation, reversals, combination of isotonics, isometric holds at end range. Focus throughout on scapular stability and strength. Right UE flexion/abduction activities in sitting with MC/A/overpressure at scapula/shoulder.     Education/Home Exercise Program:  As given previously    Assessment: After above treatment patient with increased understanding of scapular activation/positioning with elevation, yet continues to have difficulty with strength in this position.      Progress Toward Functional Goals: excellent progress toward range  goals.  Continue STG/LTG    Patient Requires Continued Skilled Care to: Patient requires continued skilled Physical Therapy intervention to focus on right UE pain/range of motion/strength to increase patients safety and independence with ADL.    Plan: continue with further treatment to focus on balance, gait, community mobility, bed mobility, transfers, ADL's, UE functioning, pain management, home exercise program  Continue per above Plan of care    Therapist Signature: Earney Hamburg, PT, DPT, NCS     [place] # [phone]

## 2012-04-06 ENCOUNTER — Ambulatory Visit: Payer: Medicare Other

## 2012-04-08 ENCOUNTER — Ambulatory Visit: Payer: Medicare Other

## 2012-04-13 ENCOUNTER — Ambulatory Visit: Payer: Medicare Other

## 2012-04-15 ENCOUNTER — Ambulatory Visit: Payer: Medicare Other

## 2012-04-20 ENCOUNTER — Ambulatory Visit
Admission: RE | Admit: 2012-04-20 | Discharge: 2012-04-20 | Disposition: A | Discharge status: Home / Self Care | Payer: Medicare Other | Source: Ambulatory Visit | Attending: Physical Medicine & Rehabilitation | Admitting: Physical Medicine & Rehabilitation

## 2012-04-20 DIAGNOSIS — M25519 Pain in unspecified shoulder: Secondary | ICD-10-CM | POA: Insufficient documentation

## 2012-04-20 DIAGNOSIS — M25819 Other specified joint disorders, unspecified shoulder: Secondary | ICD-10-CM | POA: Insufficient documentation

## 2012-04-20 DIAGNOSIS — R262 Difficulty in walking, not elsewhere classified: Secondary | ICD-10-CM | POA: Insufficient documentation

## 2012-04-20 DIAGNOSIS — G8251 Quadriplegia, C1-C4 complete: Secondary | ICD-10-CM | POA: Insufficient documentation

## 2012-04-20 NOTE — Plan of Care (Signed)
[facility]   [address]  [place], [place] [address]  Tel [phone]  Fax [phone]    PHYSICAL THERAPY PROGRESS REPORT      PATIENT: Darlene Landry    Referred By: Everlean Alstrom,*  DOB: 1941-02-28     Medical Record #: [id number]  AGE: 71 y.o.      Start of Care: 03/04/12    Date of onset: 02/17/12    Facility Provider #: [id number]  Dates of service: 03/04/12 to present  Number of visits: 10  Dates of Certification: 04/21/2012 to 06/19/12  HICN#: Medicare Sub. Num: [id number]    Diagnosis: SCI 344.01  Therapy Diagnosis: 726.2, 719.41, 719.7    Objective:    Pain: 8/10 at worst, 0/10 at best in right shoulder, increased with activity/exercises/range of motion.      Musculoskeletal Examination  PROM:   Right UE - shoulder flexion 130, external rotation 48, abduction 106 - in supine.   Left UE - shoulder flexion 150, remainder of shoulder WFL.   Strength:   AROM: Right shoulder flexion 70, abduction 70, external rotation 34 - seated   AROM: Right shoulder flexion 95 with difficulty maintaining elbow extension, abduction 95, external rotation 36 in supine    DASH: score = 41. A higher score (0-100) indicates greater disability.    Functional Mobility: Still with great difficulty reaching into cabinets/don-doff of overhead clothing and jackets/reaching for objects on counter/table secondary to pain and range of motion deficits.    Bed Mobility: Min A secondary difficulty using right UE.    Assessment: [name] is a 70 y.o. female has been seen in our outpatient clinic department to address deficits of decreased range of motion, decreased strength, decreased static/dynamic balance, increased pain, decreased motor control, and increased tone resulting in decreased tolerance and independence with functional mobility.  Patient has demonstrated excellent progress in these areas yet continues to have limitations resulting in continued risk for falls and continued need for caregiver  assistance.  [name] is demonstrating good potential to progess towards goals.  [name] requires continued skilled Physical Therapy intervention to maximize patients tolerance and independence with ADL/UE activity and decrease risk of falls/injury.  Patient has met prior short term goals # 1, #2, and #3, #4.      G Codes: Carrying, Moving, Handling Objects G Code Set  Carry/Move Objects Current Status [id number]): At least 40 percent but less than 60 percent impaired, limited or restricted  Carry/Move Objects, Goal Status [phone]): At least 20 percent but less than 40 percent impaired, limited or restricted  Tools used to determine level of impairment: Disabilities of the Arm, Shoulder and Hand Questionnaire    Plan:  Therapeutic Activities, Gait training, Patient/Caregiver Education and Training, Therapeutic Exercise, Location manager, Neuromuscular Re-education, Functional Mobility Training, Home Exercise Program, Manual Therapy, and Electrical Stimulation.    Frequency of treatment:  2 times per week for 16 weeks.    Short Term Goals: 8 weeks   1. IIncrease right shoulder flexion PROM to 140 in supine to facilitate reaching into cabinet.  2. Increase right shoulder external rotation PROM to 55 in supine to facilitate donning of jacket/shirt.  3. Decrease score on the DASH by 5 points to demonstrate progress toward functional mobility with right UE.  4. Increase AROM right shoulder flexion in sitting position to 100 degrees.    Long Term Goals: 16 weeks   1. Increase AROM right shoulder flexion to 110  in sitting/standing to facilitate reaching into cabinet.  2. Increase right shoulder flexion PROM to 150 in supine to facilitate reaching into cabinet.  3. Increase right shoulder external rotation PROM to 70 in supine to facilitate donning of jacket/shirt.  4. Decrease score on the DASH by 20 points to demonstrate progress toward functional mobility with right UE.    Therapist's Signature:  Earney Hamburg, PT, DPT, NCS     [place] # [phone]         Physician's Signature:      Date:

## 2012-04-20 NOTE — Progress Notes (Signed)
[facility]  Physical Therapy Daily Note    Date: 04/20/2012  Patient: Darlene Landry  MR#: [id number]  Visit #: 10/10 (KX at 46, POC by 05/19/12)    Start Time: 1300 to Stop Time: 1400  Total Treatment Time: 54    Treatment Code # of Minutes Treatment Code untimed   Therapeutic Exercise 24 PT Evaluation    Neuromuscular Re-Ed 8 PT Re-Evaluation    Manual Therapy 21 Canalith Repositioning    Therapeutic Activities              Subjective: Patient is agreeable to participation in the therapy session.   Returned from 2 week vacation.  C/o pain in shoulder 6/10 with activity.    Objective  Therapeutic Exercise:   Combination of isotonics for increased shoulder flexion/abd/ER/IR, isometric holds at end range for flexion/abd/ER/IR, reversals for shoulder flex/ext, IR/ER, end range and mid range PNF holds for right UE with focus on scapular stability.   Manual stretch with contract relax right UE for increased shoulder flexion/abd/ER/IR.     Manual Therapy:   Functional mobilization supine and seated to increase shoulder flexion/abd/ER. Mobilization right clavicle grade II to facilitate shoulder flexion. Soft tissue work/MFR for increased flexion/abd/ER/IR and decreased complaints of pain in right shoulder.     Neuromuscular Rehabilitation:   PNF scapular patterns with focus on anterior elevation/posterior depression - rhythmic initiation, reversals, combination of isotonics, isometric holds at end range. Focus throughout on scapular stability and strength. Right UE flexion/abduction activities in sitting with MC/A/overpressure at scapula/shoulder.     Dash  (scale of 1 = no difficulty, to 5 = unable)  Open a tight or new jar 4  Do heavy household chores (eg wash walls, wash floors) 1  Carry a shopping bag or briefcase 2  Wash your back 2  Use a knife to cut food 2  Recreational activities in which you take some force or impact through your arm 5  During the past week, to what extent has your arm, shoulder or  hand problem interfered with your normal social activities with family, friends, neighbors or groups? 1  During the past week, were you limited in your work or other regular daily activities as a result of your arm, shoulder or hand problem? 3  Please rate the severity of the following symptoms in the last week (scale of 1 = none to 5 = extreme  Arm shoulder or hand pain 3  Tingling (pins and needles) in your arm shoulder or hand 5  During the past week, how much difficulty have you had sleeping because of the pain in your arm, shoulder or hand? 1  Quick Dash Disability/Symptom Score = (29/11]-1) X 25  DASH Score = 41    Education/Home Exercise Program:  As given previously.    Assessment: See updated plan of care dated today.  Given ice pack for right shoulder after treatment.    Patient Requires Continued Skilled Care to: Patient requires continued skilled Physical Therapy intervention to focus on range of motion and strength to increase patients tolerance and independence with ADL/UE functioning.    Plan: continue with further treatment to focus on balance, gait, community mobility, ADL's, UE functioning, pain management, home exercise program  Continue per above Plan of care    Therapist Signature: Earney Hamburg, PT, DPT, NCS     [place] # [phone]

## 2012-04-22 ENCOUNTER — Ambulatory Visit
Admission: RE | Admit: 2012-04-22 | Discharge: 2012-04-22 | Disposition: A | Discharge status: Home / Self Care | Payer: Medicare Other | Source: Ambulatory Visit

## 2012-04-22 NOTE — Progress Notes (Signed)
[facility]  Physical Therapy Daily Note    Date: 04/22/2012  Patient: Darlene Landry  MR#: [id number]  Visit #: 11/20 (KX at 75, POC by 05/19/12)    Start Time: 1300 to Stop Time: 1400  Total Treatment Time: 54    Treatment Code # of Minutes Treatment Code untimed   Therapeutic Exercise 14 PT Evaluation    Neuromuscular Re-Ed 16 PT Re-Evaluation    Manual Therapy 24 Canalith Repositioning    Therapeutic Activities              Subjective: Patient is agreeable to participation in the therapy session.   Reports good compliance with HEP    Objective  Therapeutic Exercise:   Combination of isotonics for increased shoulder flexion/abd/ER/IR, isometric holds at end range for flexion/abd/ER/IR, reversals for shoulder flex/ext, IR/ER, end range and mid range PNF holds for right UE with focus on scapular stability.   Manual stretch with contract relax right UE for increased shoulder flexion/abd/ER/IR.     Manual Therapy:   Functional mobilization supine and seated to increase shoulder flexion/abd/ER. Mobilization right clavicle grade II to facilitate shoulder flexion. Soft tissue work/MFR for increased flexion/abd/ER/IR/cervical rotation/sidebending to left and decreased complaints of pain in right shoulder.     Neuromuscular Rehabilitation:   PNF scapular patterns with focus on anterior elevation/posterior depression - rhythmic initiation, reversals, combination of isotonics, isometric holds at end range. Focus throughout on scapular stability and strength. Right UE flexion/abduction activities in sitting with MC/A/overpressure at scapula/shoulder and verbal cues for scapula positioning/involvement    Education/Home Exercise Program:  As given previously.    Assessment: After above treatment patient able to perform active shoulder flexion to 75, yet continues to have difficulty with using right UE functionally with reach in ADL.      Progress Toward Functional Goals: progressing toward new STG # 4  Continue  STG/LTG    Patient Requires Continued Skilled Care to: Patient requires continued skilled Physical Therapy intervention to focus on range of motion/strength/pain to increase patients tolerance and independence with ADL.    Plan: continue with further treatment to focus on balance, gait, community mobility, ADL's, UE functioning, pain management, home exercise program  Continue per above Plan of care    Therapist Signature: Earney Hamburg, PT, DPT, NCS     [place] # [phone]

## 2012-04-27 ENCOUNTER — Ambulatory Visit: Payer: Medicare Other

## 2012-04-29 ENCOUNTER — Ambulatory Visit
Admission: RE | Admit: 2012-04-29 | Discharge: 2012-04-29 | Disposition: A | Discharge status: Home / Self Care | Payer: Medicare Other | Source: Ambulatory Visit

## 2012-04-29 NOTE — Progress Notes (Signed)
[facility]  Physical Therapy Daily Note    Date: 04/29/2012  Patient: Darlene Landry  MR#: [id number]  Visit #: 12/20 (KX at 64, POC by 05/19/12)    Start Time: 1300 to Stop Time: 1400  Total Treatment Time: 55    Treatment Code # of Minutes Treatment Code untimed   Therapeutic Exercise 25 PT Evaluation    Neuromuscular Re-Ed 15 PT Re-Evaluation    Manual Therapy 15 Canalith Repositioning    Therapeutic Activities              Subjective: Patient is agreeable to participation in the therapy session.   my neck feels tighter.    Objective  Therapeutic Exercise:   Combination of isotonics for increased shoulder flexion/abd/ER/IR, isometric holds at end range for flexion/abd/ER/IR, reversals for shoulder flex/ext, IR/ER, end range and mid range PNF holds for right UE with focus on scapular stability.   Manual stretch with contract relax right UE for increased shoulder flexion/abd/ER/IR.  Corrected technique for left cervical flexion/rotation with scapula depression    Manual Therapy:   Functional mobilization supine and seated to increase shoulder flexion/abd/ER. Mobilization right clavicle grade II to facilitate shoulder flexion. Soft tissue work/MFR for increased flexion/abd/ER/IR/cervical rotation/sidebending to left and decreased complaints of pain in right shoulder.     Neuromuscular Rehabilitation:   PNF scapular patterns with focus on anterior elevation/posterior depression - rhythmic initiation, reversals, combination of isotonics, isometric holds at end range. Focus throughout on scapular stability and strength. Right UE flexion/abduction activities in sitting with MC/A/overpressure at scapula/shoulder and verbal cues for scapula positioning/involvement      Education/Home Exercise Program:  Corrected levator scap stretch and encouraged STW with elbow extension stretch.    Assessment: Needs further focus on forward reach with attention to elbow extension.    Progress Toward Functional Goals:  progressing toward Range of motion goals  Continue STG/LTG    Patient Requires Continued Skilled Care to: Patient requires continued skilled Physical Therapy intervention to focus on range of motion/strength to increase patients safety, tolerance and independence with ADL/UE functioning.    Plan: continue with further treatment to focus on balance, gait, community mobility, ADL's, UE functioning, pain management, home exercise program  Continue per above Plan of care    Therapist Signature: Earney Hamburg, PT, DPT, NCS     [place] # [phone]

## 2012-05-04 ENCOUNTER — Ambulatory Visit
Admission: RE | Admit: 2012-05-04 | Discharge: 2012-05-04 | Disposition: A | Discharge status: Home / Self Care | Payer: Medicare Other | Source: Ambulatory Visit

## 2012-05-04 NOTE — Progress Notes (Signed)
[facility]  Physical Therapy Daily Note    Date: 05/04/2012  Patient: Darlene Landry  MR#: [id number]  Visit #: 13/20 (KX at 28, POC by 05/19/12)    Start Time: 1300 to Stop Time: 1400  Total Treatment Time: 54    Treatment Code # of Minutes Treatment Code untimed   Therapeutic Exercise 24 PT Evaluation    Neuromuscular Re-Ed 12 PT Re-Evaluation    Manual Therapy 18 Canalith Repositioning    Therapeutic Activities              Subjective: Patient is agreeable to participation in the therapy session.   Reports working on cervical stretches.    Objective  Therapeutic Exercise:   Combination of isotonics for increased shoulder flexion/abd/ER/IR, isometric holds at end range for flexion/abd/ER/IR, reversals for shoulder flex/ext, IR/ER, end range and mid range PNF holds for right UE with focus on scapular stability.   Manual stretch with contract relax right UE for increased shoulder flexion/abd/ER/IR. Corrected technique for left cervical flexion/rotation with scapula depression     Manual Therapy:   Functional mobilization supine and seated to increase shoulder flexion/abd/ER. Mobilization right clavicle grade II to facilitate shoulder flexion. Soft tissue work/MFR for increased flexion/abd/ER/IR/cervical rotation/sidebending to left and decreased complaints of pain in right shoulder with upper extremity reach    Neuromuscular Rehabilitation:   PNF scapular patterns with focus on anterior elevation/posterior depression - rhythmic initiation, reversals, combination of isotonics, isometric holds at end range. Focus throughout on scapular stability and strength. Right UE reaching activities in sitting with MC/A/overpressure at scapula/shoulder     Education/Home Exercise Program:  Continue with current HEP - added partial push ups on back of couch with elbow as focus.    Assessment: forward shoulder flexion to 75 in sitting today - will increase focus on elbow extension with shoulder flexion.  Progress Toward  Functional Goals: progressing toward range of motion goals.   Continue STG/LTG    Patient Requires Continued Skilled Care to: Patient requires continued skilled Physical Therapy intervention to focus on range of motion/strength to increase patients tolerance and independence with ADL.    Plan: continue with further treatment to focus on balance, gait, community mobility, ADL's, UE functioning, pain management, home exercise program  Continue per above Plan of care    Therapist Signature: Earney Hamburg, PT, DPT, NCS     [place] # [phone]

## 2012-05-06 ENCOUNTER — Ambulatory Visit
Admission: RE | Admit: 2012-05-06 | Discharge: 2012-05-06 | Disposition: A | Discharge status: Home / Self Care | Payer: Medicare Other | Source: Ambulatory Visit

## 2012-05-06 NOTE — Progress Notes (Signed)
[facility]  Physical Therapy Daily Note    Date: 05/06/2012  Patient: Darlene Landry  MR#: [id number]  Visit #: 14/20 (KX at 68, POC by 05/19/12)    Start Time: 1300 to Stop Time: 1400  Total Treatment Time: 54    Treatment Code # of Minutes Treatment Code untimed   Therapeutic Exercise 24 PT Evaluation    Neuromuscular Re-Ed 16 PT Re-Evaluation    Manual Therapy 14 Canalith Repositioning    Therapeutic Activities              Subjective: Patient is agreeable to participation in the therapy session.   Its frustrating that it won't get better faster.    Objective  Therapeutic Exercise:   Combination of isotonics for increased shoulder flexion/abd/ER/IR, isometric holds at end range for flexion/abd/ER/IR, reversals for shoulder flex/ext, IR/ER, end range and mid range PNF holds for right UE with focus on scapular stability.   Manual stretch with contract relax right UE for increased shoulder flexion/abd/ER/IR.     Manual Therapy:   Functional mobilization supine and seated to increase shoulder flexion/abd/ER. Mobilization right clavicle grade II to facilitate shoulder flexion. Soft tissue work/MFR for increased flexion/abd/ER/IR/cervical rotation/sidebending to left and decreased complaints of pain in right shoulder with upper extremity reach.  STW right UE for increased elbow extension with reach.    Neuromuscular Rehabilitation:   PNF scapular patterns with focus on anterior elevation/posterior depression - rhythmic initiation, reversals, combination of isotonics, isometric holds at end range. Focus throughout on scapular stability and strength. Right UE reaching activities in sitting with MC/A/overpressure - manual cues primarily at scapula to facilitate correct movement.    Education/Home Exercise Program:  As given previously, altered HEP to bilateral forward shoulder flexion with focus on elbow extension, seated ER/elbow ext, and standing holding onto a doorknob in supination with trunk  rotation.    Assessment: Continues to have difficulty with all elevation activities especially with elbow extension.      Progress Toward Functional Goals: progressing toward range of motion goals  Continue STG/LTG    Patient Requires Continued Skilled Care to: Patient requires continued skilled Physical Therapy intervention to focus on strength/range of motion to increase patients safety and independence with ADLs.    Plan: continue with further treatment to focus on community mobility, ADL's, UE functioning, pain management, home exercise program  Continue per above Plan of care    Therapist Signature: Earney Hamburg, PT, DPT, NCS     [place] # [phone]

## 2012-05-11 ENCOUNTER — Ambulatory Visit: Payer: Medicare Other

## 2012-05-11 ENCOUNTER — Ambulatory Visit
Admission: RE | Admit: 2012-05-11 | Discharge: 2012-05-11 | Disposition: A | Discharge status: Home / Self Care | Payer: Medicare Other | Source: Ambulatory Visit

## 2012-05-11 NOTE — Progress Notes (Signed)
[facility]  Physical Therapy Daily Note    Date: 05/11/2012  Patient: Darlene Landry  MR#: [id number]  Visit #: 15/20 (KX at 65, POC by 05/19/12)    Start Time: 1300 to Stop Time: 1400  Total Treatment Time: 54    Treatment Code # of Minutes Treatment Code untimed   Therapeutic Exercise 14 PT Evaluation    Neuromuscular Re-Ed 16 PT Re-Evaluation    Manual Therapy 24 Canalith Repositioning    Therapeutic Activities              Subjective: Patient is agreeable to participation in the therapy session.   Reports compliance with new HEP.    Objective  Therapeutic Exercise:   Combination of isotonics for increased shoulder flexion/abd/ER/IR, isometric holds at end range for flexion/abd/ER/IR, reversals for shoulder flex/ext, IR/ER   Manual stretch with contract relax right UE for increased shoulder flexion/abd/ER/IR.     Manual Therapy:   Functional mobilization supine and seated to increase shoulder flexion/abd/ER. Mobilization right clavicle grade II to facilitate shoulder flexion. Soft tissue work/MFR for increased flexion/abd/ER/IR/cervical rotation/sidebending to left and decreased complaints of pain in right shoulder with upper extremity reach. STW right UE for increased elbow extension with reach.     Neuromuscular Rehabilitation:   Right UE reaching activities in sitting with MC/A/overpressure - manual cues primarily at scapula to facilitate correct movement.     Education/Home Exercise Program:  To continue with current HEP    Assessment: After above treatment patient able to tolerate passive shoulder flexion to 136 in sitting.  Progress Toward Functional Goals: working toward range of motion goals  Continue STG/LTG    Patient Requires Continued Skilled Care to: Patient requires continued skilled Physical Therapy intervention to focus on range of motion/strength/motor control to increase patients safety and independence with ADL.    Plan: continue with further treatment to focus on balance, gait,  community mobility, bed mobility, transfers, ADL's, UE functioning, pain management, home exercise program  Continue per above Plan of care    Therapist Signature: Earney Hamburg, PT, DPT, NCS     [place] # [phone]

## 2012-05-13 ENCOUNTER — Ambulatory Visit
Admission: RE | Admit: 2012-05-13 | Discharge: 2012-05-13 | Disposition: A | Discharge status: Home / Self Care | Payer: Medicare Other | Source: Ambulatory Visit

## 2012-05-13 NOTE — Progress Notes (Signed)
[facility]  Physical Therapy Daily Note    Date: 05/13/2012  Patient: Darlene Landry  MR#: [id number]  Visit #: 16/20 (KX at 46, POC by 05/19/12)    Start Time: 1300 to Stop Time: 1400  Total Treatment Time: 54    Treatment Code # of Minutes Treatment Code untimed   Therapeutic Exercise 18 PT Evaluation    Neuromuscular Re-Ed 8 PT Re-Evaluation    Manual Therapy 28 Canalith Repositioning    Therapeutic Activities              Subjective: Patient is agreeable to participation in the therapy session.   Reports increased soreness after last session.    Objective  Therapeutic Exercise:   Combination of isotonics for increased shoulder flexion/abd/ER/IR, isometric holds at end range for flexion/abd/ER/IR, reversals for shoulder flex/ext, IR/ER   Manual stretch with contract relax right UE for increased shoulder flexion/abd/ER/IR.     Manual Therapy:   Functional mobilization supine and seated to increase shoulder flexion/abd/ER. Mobilization right clavicle grade II to facilitate shoulder flexion. Soft tissue work/MFR for increased flexion/abd/ER/IR/cervical rotation/sidebending to left and decreased complaints of pain in right shoulder with upper extremity reach. STW right UE for increased elbow extension with reach.     Neuromuscular Rehabilitation:   Right UE reaching activities forward and laterally in sitting and standing with MC/A/overpressure - manual cues primarily at scapula to facilitate correct movement.   Use of mirror also to assist with feedback. PNF scapular patterns with focus on anterior elevation/posterior depression - rhythmic initiation, reversals    Education/Home Exercise Program:  As given previously    Assessment:    PROM: Right UE - shoulder flexion 130, external rotation 50, abduction 112 - in supine. Left UE - shoulder flexion 150, remainder of shoulder WFL.   Strength: AROM: Right shoulder flexion 70, abduction 70, external rotation 34 - seated   AROM: Right shoulder flexion 95 with  decreased difficulty maintaining elbow extension, abduction 95, external rotation 36 in supine     Progress Toward Functional Goals: progress toward STG #2  Continue STG/LTG    Patient Requires Continued Skilled Care to: Patient requires continued skilled Physical Therapy intervention to focus on range of motion/strength/pain relief to increase patients safety and independence with ADL.    Plan: continue with further treatment to focus on balance, gait, community mobility, bed mobility, transfers, ADL's, UE functioning, pain management, home exercise program  Continue per above Plan of care    Therapist Signature: Earney Hamburg, PT, DPT, NCS     [place] # [phone]

## 2012-05-18 ENCOUNTER — Ambulatory Visit: Payer: Medicare Other

## 2012-05-20 ENCOUNTER — Ambulatory Visit
Admission: RE | Admit: 2012-05-20 | Discharge: 2012-05-20 | Disposition: A | Discharge status: Home / Self Care | Payer: Medicare Other | Source: Ambulatory Visit

## 2012-05-20 NOTE — Progress Notes (Signed)
[facility]  Physical Therapy Daily Note    Date: 05/20/2012  Patient: Darlene Landry  MR#: [id number]  Visit #: 17/20 (KX at 74, POC by 05/19/12)    Start Time: 1300 to Stop Time: 1400  Total Treatment Time: 54    Treatment Code # of Minutes Treatment Code untimed   Therapeutic Exercise 10 PT Evaluation    Neuromuscular Re-Ed 28 PT Re-Evaluation    Manual Therapy 16 Canalith Repositioning    Therapeutic Activities              Subjective: Patient is agreeable to participation in the therapy session.   I think its better!    Objective  Therapeutic Exercise:   Combination of isotonics for increased shoulder flexion/abd/ER/IR, isometric holds at end range for flexion/abd/ER/IR, reversals for shoulder flex/ext, IR/ER   Manual stretch with contract relax right UE for increased shoulder flexion/abd/ER/IR.   Manual Therapy:   Functional mobilization supine and seated to increase shoulder flexion/abd/ER. Mobilization right clavicle grade II to facilitate shoulder flexion. Soft tissue work/MFR for increased flexion/abd/ER/IR/cervical rotation/sidebending to left and decreased complaints of pain in right shoulder with upper extremity reach. STW right UE for increased elbow extension with reach.   Neuromuscular Rehabilitation:   Right UE reaching activities forward and laterally in sitting and standing with MC/A/overpressure - manual cues primarily at scapula to facilitate correct movement. Use of mirror also to assist with feedback. PNF scapular patterns with focus on anterior elevation/posterior depression - rhythmic initiation, reversals    Education/Home Exercise Program:  Continue with current HEP, altered shoulder flexion PROM in supine to AAROM on table    Assessment: AROM: Right shoulder flexion 75, abduction 76, external rotation 38 - seated     Progress Toward Functional Goals: progressing toward STG #2  Continue STG/LTG    Patient Requires Continued Skilled Care to: Patient requires continued skilled  Physical Therapy intervention to focus on strength/range of motion to increase patients safety and independence with functional mobility.    Plan: continue with further treatment to focus on bed mobility, transfers, ADL's, UE functioning, pain management, home exercise program  Continue per above Plan of care    Therapist Signature: Earney Hamburg, PT, DPT, NCS     [place] # [phone]

## 2012-05-22 ENCOUNTER — Ambulatory Visit
Admission: RE | Admit: 2012-05-22 | Discharge: 2012-05-22 | Disposition: A | Discharge status: Home / Self Care | Payer: Medicare Other | Source: Ambulatory Visit | Attending: Physical Medicine & Rehabilitation | Admitting: Physical Medicine & Rehabilitation

## 2012-05-22 DIAGNOSIS — R262 Difficulty in walking, not elsewhere classified: Secondary | ICD-10-CM | POA: Insufficient documentation

## 2012-05-22 NOTE — Progress Notes (Signed)
[facility]  Physical Therapy Daily Note    Date: 05/22/2012  Patient: Darlene Landry  MR#: [id number]  Visit #: 18/20 (KX at 21, POC by 05/19/12)    Start Time: 1300 to Stop Time: 1400  Total Treatment Time: 54    Treatment Code # of Minutes Treatment Code untimed   Therapeutic Exercise 20 PT Evaluation    Neuromuscular Re-Ed 10 PT Re-Evaluation    Manual Therapy 24 Canalith Repositioning    Therapeutic Activities              Subjective: Patient is agreeable to participation in the therapy session.   I like the table top stretch    Objective  Therapeutic Exercise:   Combination of isotonics for increased shoulder flexion/abd/ER/IR, isometric holds at end range for flexion/abd/ER/IR, reversals for shoulder flex/ext, IR/ER   Manual stretch with contract relax right UE for increased shoulder flexion/abd/ER/IR.     Manual Therapy:   Functional mobilization supine and seated to increase shoulder flexion/abd/ER. Mobilization right clavicle grade II to facilitate shoulder flexion. Soft tissue work/MFR for increased flexion/abd/ER/IR/cervical rotation/sidebending to left and decreased complaints of pain in right shoulder with upper extremity reach.     Neuromuscular Rehabilitation:   Right UE reaching activities forward and laterally in sitting with MC/A/overpressure - manual cues primarily at scapula to facilitate correct movement.  PNF scapular patterns with focus on posterior depression - rhythmic initiation, reversals    Education/Home Exercise Program:  Continue with current HEP, added horizontal adduction for post shoulder stretch    Assessment: right shoulder abduction in supine 112 degrees    Progress Toward Functional Goals: slow progress toward range goals  Continue STG/LTG    Patient Requires Continued Skilled Care to: Patient requires continued skilled Physical Therapy intervention to focus on range of motion/pain/strength to increase patients safety and independence with ADL.    Plan: continue with  further treatment to focus on ADL's, UE functioning, pain management, home exercise program  Continue per above Plan of care    Therapist Signature: Earney Hamburg, PT, DPT, NCS     [place] # [phone]

## 2012-05-25 ENCOUNTER — Ambulatory Visit
Admission: RE | Admit: 2012-05-25 | Discharge: 2012-05-25 | Disposition: A | Discharge status: Home / Self Care | Payer: Medicare Other | Source: Ambulatory Visit

## 2012-05-25 NOTE — Progress Notes (Signed)
[facility]  Physical Therapy Daily Note    Date: 05/25/2012  Patient: Darlene Landry  MR#: [id number]  Visit #: 19/20 (threshold at 33)    Start Time: 1300 to Stop Time: 1400  Total Treatment Time: 54    Treatment Code # of Minutes Treatment Code untimed   Therapeutic Exercise 10 PT Evaluation    Neuromuscular Re-Ed 16 PT Re-Evaluation    Manual Therapy 28 Canalith Repositioning    Therapeutic Activities              Subjective: Patient is agreeable to participation in the therapy session.   c/o pain 7/10 at worst over last few weeks, but only lasting a minute or two most of the time.  I used my right arm to turn on a light switch, now able to take something out of the oven secondary to more strength.    Objective  Therapeutic Exercise:   Combination of isotonics for increased shoulder flexion/abd/ER/IR, isometric holds at end range for flexion/abd/ER/IR    Manual Therapy:   Functional mobilization supine and seated to increase shoulder flexion/abd/ER. Mobilization right clavicle grade II to facilitate shoulder flexion. Soft tissue work/MFR for increased flexion/abd/ER/IR/cervical rotation/sidebending to left and decreased complaints of pain in right shoulder with upper extremity reach.     Neuromuscular Rehabilitation:   Right UE reaching activities forward and laterally in sitting with MC/A/overpressure - manual cues primarily at scapula to facilitate correct movement. PNF scapular patterns with focus on posterior depression - rhythmic initiation, reversals    Dash  (scale of 1 = no difficulty, to 5 = unable)  1) Open a tight or new jar 3  2) Do heavy household chores (eg wash walls, wash floors) 4  3) Carry a shopping bag or briefcase 4  4) Wash your back 1  5) Use a knife to cut food 4  6) Recreational activities in which you take some force or impact through your arm 5  7) During the past week, to what extent has your arm, shoulder or hand problem interfered with your normal social activities with  family, friends, neighbors or groups? 1  8) During the past week, were you limited in your work or other regular daily activities as a result of your arm, shoulder or hand problem? 1  Please rate the severity of the following symptoms in the last week (scale of 1 = none to 5 = extreme  9) Arm shoulder or hand pain 3  10) Tingling (pins and needles) in your arm shoulder or hand 5  11) During the past week, how much difficulty have you had sleeping because of the pain in your arm, shoulder or hand? 1  Quick Dash Disability/Symptom Score = ([sum of n responses/n]-1) X 25  DASH Score = 47    Education/Home Exercise Program:  Continue with current HEP to focus on forward reach rather than upward reach for improved mechanics.    Assessment: DASH today at 47, previous score of 41.  A higher score indicates greater disability.  Patient reporting encouraged about improved functioning yet subjective rating on DASH worse than prior time.  Able to perform shoulder flexion PROM to 140 today    Progress Toward Functional Goals: met STG #1  Continue STG/LTG    Patient Requires Continued Skilled Care to: Patient requires continued skilled Physical Therapy intervention to focus on range of motion/strength/pain to increase patients safety and independence with ADL.    Plan: continue with further treatment  to focus on bed mobility, ADL's, UE functioning, pain management, home exercise program  Continue per above Plan of care    Therapist Signature: Earney Hamburg, PT, DPT, NCS     [place] # [phone]

## 2012-05-27 ENCOUNTER — Ambulatory Visit
Admission: RE | Admit: 2012-05-27 | Discharge: 2012-05-27 | Disposition: A | Discharge status: Home / Self Care | Payer: Medicare Other | Source: Ambulatory Visit

## 2012-05-27 NOTE — Plan of Care (Signed)
[facility]   [address]  [place], [place] [address]  Tel [phone]  Fax [phone]    PHYSICAL THERAPY PROGRESS REPORT      PATIENT: Darlene Landry   Referred By: Everlean Alstrom,*  DOB: 02-13-1941    Medical Record #: [id number]  AGE: 71 y.o.     Start of Care: 03/04/12    Date of onset: 02/17/12   Facility Provider #: [id number]  Dates of service: 03/04/12 to present Number of visits: 20  Dates of Certification: 05/27/2012 to 07/20/12 HICN#: Medicare Sub. 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  Reports decreased frequency of pain,    Musculoskeletal Examination   PROM:   Right UE - shoulder flexion 142, external rotation 55, abduction 113 - in supine.   Left UE -  WFL.     Strength:   AROM: Seated - Right shoulder flexion 75, abduction 85, scaption 85 external rotation 40.  Patient is now able to perform isometric contractions between 75 and 90 degrees and hold with less assistance but unable to perform the range actively.  AROM: Supine - Right shoulder flexion 95 with increased ease in maintaining elbow extension, abduction 95, external rotation 40.    DASH: score = 41. A higher score (0-100) indicates greater disability.     Functional Mobility: Still with great difficulty reaching into cabinets/don-doff of overhead clothing and jackets.  Now able to perform reach for objects on counter/table with increased ease.  Able to place casserole dish into oven and remove it now independently.  Now able to use right UE to turn on/off light switch.     Bed Mobility: Min A secondary difficulty using right UE.     Assessment: Darlene Landry is a 71 y.o. female who has been  seen in our outpatient clinic department to address deficits of decreased range of motion, decreased strength, decreased static/dynamic balance, increased pain, decreased motor control, and increased tone resulting in decreased tolerance and independence with functional mobility. Patient has demonstrated good progress in these areas yet continues to have limitations resulting in continued need for caregiver assistance. Darlene Landry is demonstrating good potential to progess towards goals. Darlene Landry requires continued skilled Physical Therapy intervention to maximize patients tolerance and independence with ADL/UE activity. Patient has met prior short term goals # 1 with progress toward remainder of goals.  Home exercise program altered recently to address slow progression.  Patient and husband both continue to be very compliant with HEP.    G Codes:   Carrying, Moving, Handling Objects G Code Set  Carry/Move Objects Current Status [phone]): At least 40 percent but less than 60 percent impaired, limited or restricted  Carry/Move Objects, Goal Status [phone]): At least 20 percent but less than 40 percent impaired, limited or restricted  Tools used to determine level of impairment: Disabilities of the Arm, Shoulder and Hand Questionnaire    Plan: Therapeutic Activities, Patient/Caregiver Education and Training, Therapeutic Exercise, Neuromuscular Re-education, Functional Mobility Training, Home Exercise Program, Manual Therapy, and Electrical Stimulation.     Frequency of treatment: 2 times per week for 16 weeks.   Short Term Goals: 8 weeks   1. Increase  right shoulder flexion PROM to 150 in supine to facilitate reaching into cabinet.  2. Increase right shoulder external rotation PROM to 55 in supine to facilitate donning of jacket/shirt.  3. Decrease score on the DASH by 5 points to demonstrate progress toward functional mobility with right UE.  4. Increase AROM right shoulder flexion in sitting position to  90 degrees.    Long Term Goals: 16 weeks   1. Increase AROM right shoulder flexion to 110 in sitting/standing to facilitate reaching into cabinet.  2. Increase right shoulder flexion PROM to 150 in supine to facilitate reaching into cabinet.  3. Increase right shoulder external rotation PROM to 70 in supine to facilitate donning of jacket/shirt.  4. Decrease score on the DASH by 20 points to demonstrate progress toward functional mobility with right UE.    Therapist's Signature:  Earney Hamburg, PT, DPT, NCS     [place] # [phone]     Physician's Signature:      Date:

## 2012-05-27 NOTE — Progress Notes (Signed)
[facility]  Physical Therapy Daily Note    Date: 05/27/2012  Patient: Darlene Landry  MR#: [id number]  Visit #: 20/20 (threshold at 33)    Start Time: 1400 to Stop Time: 1500  Total Treatment Time: 54    Treatment Code # of Minutes Treatment Code untimed   Therapeutic Exercise 10 PT Evaluation    Neuromuscular Re-Ed 16 PT Re-Evaluation    Manual Therapy 28 Canalith Repositioning    Therapeutic Activities              Subjective: Patient is agreeable to participation in the therapy session.   increased c/o pain in lateral shoulder today.    Objective  Therapeutic Exercise:   Combination of isotonics for increased shoulder flexion/abd/ER/IR, isometric holds at end range for flexion/abd/ER/IR     Manual Therapy:   Functional mobilization supine and seated to increase shoulder flexion/abd/ER. Mobilization right clavicle grade II to facilitate shoulder flexion. Soft tissue work/MFR for increased flexion/abd/ER/IR/cervical rotation/sidebending to left and decreased complaints of pain in right shoulder with upper extremity reach.     Neuromuscular Rehabilitation:   Right UE reaching activities forward and laterally in sitting with MC/A/overpressure - manual cues primarily at scapula to facilitate correct movement. PNF scapular patterns with focus on posterior depression - rhythmic initiation, reversals    Education/Home Exercise Program: altered shoulder flexion to isometric holds in 75-90 degree range and passive shoulder table stretch to decrease c/o pain    Assessment: See plan of care written today.    Therapist Signature: Earney Hamburg, PT, DPT, NCS     [place] # [phone]

## 2012-06-01 ENCOUNTER — Ambulatory Visit
Admission: RE | Admit: 2012-06-01 | Discharge: 2012-06-01 | Disposition: A | Discharge status: Home / Self Care | Payer: Medicare Other | Source: Ambulatory Visit

## 2012-06-01 NOTE — Progress Notes (Signed)
[facility]  Physical Therapy Daily Note    Date: 06/01/2012  Patient: Darlene Landry  MR#: [id number]  Visit #: 21/30 (threshold at 33)    Start Time: 1300 to Stop Time: 1400  Total Treatment Time: 54    Treatment Code # of Minutes Treatment Code untimed   Therapeutic Exercise 10 PT Evaluation    Neuromuscular Re-Ed 14 PT Re-Evaluation    Manual Therapy 30 Canalith Repositioning    Therapeutic Activities              Subjective: Patient is agreeable to participation in the therapy session.   I'm a little tighter today    Objective  Therapeutic Exercise:   Combination of isotonics for increased shoulder flexion/abd/ER/IR, isometric holds at end range for flexion/abd/ER/IR     Manual Therapy:   Functional mobilization supine and seated to increase shoulder flexion/abd/ER. Mobilization right clavicle grade II to facilitate shoulder flexion. Soft tissue work/MFR for increased flexion/abd/ER/IR/cervical rotation/sidebending to left and decreased complaints of pain in right shoulder with upper extremity reach.     Neuromuscular Rehabilitation:   Right UE reaching activities forward and laterally in sitting with MC/A/overpressure - manual cues primarily at scapula to facilitate correct movement. PNF scapular patterns with focus on posterior depression - rhythmic initiation, reversals    Education/Home Exercise Program:  Continue with current HEP - added abduction table stretch    Assessment: altered HEP to increase lateral stretch.      Progress Toward Functional Goals: progress toward range of motion goals  Continue STG/LTG    Patient Requires Continued Skilled Care to: Patient requires continued skilled Physical Therapy intervention to focus on range of motion/strength/pain to increase patients safety and independence with ADL.    Plan: continue with further treatment to focus on ADL's, UE functioning, pain management, home exercise program  Continue per above Plan of care    Therapist Signature: Earney Hamburg, PT, DPT, NCS     [place] # [phone]

## 2012-06-03 ENCOUNTER — Ambulatory Visit
Admission: RE | Admit: 2012-06-03 | Discharge: 2012-06-03 | Disposition: A | Discharge status: Home / Self Care | Payer: Medicare Other | Source: Ambulatory Visit

## 2012-06-03 NOTE — Progress Notes (Signed)
[facility]  Physical Therapy Daily Note    Date: 06/03/2012  Patient: Darlene Landry  MR#: [id number]  Visit #: 22/30 (threshold at 33)    Start Time: 1300 to Stop Time: 1400  Total Treatment Time: 54    Treatment Code # of Minutes Treatment Code untimed   Therapeutic Exercise 15 PT Evaluation    Neuromuscular Re-Ed 8 PT Re-Evaluation    Manual Therapy 31 Canalith Repositioning    Therapeutic Activities              Subjective: Patient is agreeable to participation in the therapy session.   I'm working hard on the new exercises    Objective  Therapeutic Exercise:   Isometric holds for shoulder flexion/abduction/scaption from 75 to 90 degree range with MC/A for increased external rotation.    Manual Therapy:   Functional mobilization supine and seated to increase shoulder flexion/abd/ER. Mobilization right clavicle grade II to facilitate shoulder flexion. Soft tissue work/MFR for increased flexion/abd/ER/IR/cervical rotation/sidebending to left and decreased complaints of pain in right shoulder with upper extremity reach.     Neuromuscular Rehabilitation:   Right UE reaching activities forward and laterally in sitting and forward in sidelying with MC/A/overpressure - manual cues primarily at scapula to facilitate correct movement. PNF scapular patterns with focus on posterior depression - rhythmic initiation, reversals    Education/Home Exercise Program:  Continue with current HEP    Assessment: Increased intensity of stretching today.     Progress Toward Functional Goals: progressing slowly toward AROM goals  Continue STG/LTG    Patient Requires Continued Skilled Care to: Patient requires continued skilled Physical Therapy intervention to focus on strength/range of motion/pain to increase patients safety and independence with ADL.    Plan: continue with further treatment to focus on ADL's, UE functioning, pain management, home exercise program  Continue per above Plan of care    Therapist Signature:  Earney Hamburg, PT, DPT, NCS     [place] # [phone]

## 2012-06-08 ENCOUNTER — Ambulatory Visit
Admission: RE | Admit: 2012-06-08 | Discharge: 2012-06-08 | Disposition: A | Discharge status: Home / Self Care | Payer: Medicare Other | Source: Ambulatory Visit

## 2012-06-08 NOTE — Progress Notes (Signed)
[facility]  Physical Therapy Daily Note    Date: 06/08/2012  Patient: Darlene Landry  MR#: [id number]  Visit #: 23/30 (threshold at 33)    Start Time: 1300 to Stop Time: 1400  Total Treatment Time: 54    Treatment Code # of Minutes Treatment Code untimed   Therapeutic Exercise 28 PT Evaluation    Neuromuscular Re-Ed 12 PT Re-Evaluation    Manual Therapy 24 Canalith Repositioning    Therapeutic Activities              Subjective: Patient is agreeable to participation in the therapy session.   Reports increased pain yesterday, but no increase in pain after last session.    Objective  Therapeutic Exercise:   Isometric holds and small reversals for shoulder flexion/abduction/scaption from 70 to 90 degree range with MC/A for increased external rotation.     Manual Therapy:   Functional mobilization supine and seated to increase shoulder flexion/abd/ER. Mobilization right clavicle grade II to facilitate shoulder flexion. Soft tissue work/MFR for increased flexion/abd/ER/IR/cervical rotation/sidebending to left and decreased complaints of pain in right shoulder with upper extremity reach.     Neuromuscular Rehabilitation:   Right UE reaching activities forward and laterally in sitting and forward in sidelying with MC/A/overpressure - manual cues primarily at scapula to facilitate correct movement. PNF scapular patterns with focus on posterior depression - rhythmic initiation, reversals    Education/Home Exercise Program:  Continue with current HEP    Assessment: AROM right shoulder flexion in supine to 115 after above treatment.    Progress Toward Functional Goals: progressing toward PROM goals  Continue STG/LTG    Patient Requires Continued Skilled Care to: Patient requires continued skilled Physical Therapy intervention to focus on strength/range of motion to increase patients safety and independence with ADL.    Plan: continue with further treatment to focus onADL's, UE functioning, pain management, home  exercise program  Continue per above Plan of care    Therapist Signature: Earney Hamburg, PT, DPT, NCS     [place] # [phone]

## 2012-06-10 ENCOUNTER — Ambulatory Visit
Admission: RE | Admit: 2012-06-10 | Discharge: 2012-06-10 | Disposition: A | Discharge status: Home / Self Care | Payer: Medicare Other | Source: Ambulatory Visit

## 2012-06-10 NOTE — Progress Notes (Signed)
[facility]  Physical Therapy Daily Note    Date: 06/10/2012  Patient: Darlene Landry  MR#: [id number]  Visit #: 24/30 (threshold at 33)    Start Time: 1305 to Stop Time: 1400  Total Treatment Time: 53    Treatment Code # of Minutes Treatment Code untimed   Therapeutic Exercise 24 PT Evaluation    Neuromuscular Re-Ed 10 PT Re-Evaluation    Manual Therapy 19 Canalith Repositioning    Therapeutic Activities              Subjective: Patient is agreeable to participation in the therapy session.   I'm reaching up on the counter so much easier now.    Objective  Therapeutic Exercise:   Isometric holds and small reversals for shoulder flexion/abduction/scaption from 70 to 90 degree range with MC/A for increased external rotation.   Manual stretch with contract relax for increased right shoulder flexion/abduction/ER.    Manual Therapy:   Functional mobilization supine and seated to increase shoulder flexion/abd/ER.  Soft tissue work/MFR for increased flexion/abd/ER/IR/cervical rotation/sidebending to left and decreased complaints of pain in right shoulder with upper extremity reach.     Neuromuscular Rehabilitation:   Right UE reaching activities forward and laterally in sitting with A/overpressure. PNF scapular patterns with focus on posterior depression - rhythmic initiation, reversals, COI.    Education/Home Exercise Program:  Continue with current HEP    Assessment: after clavicular mobs patient with decreased c/o pain in shoulder with flexion    Progress Toward Functional Goals: progressing toward AROM goals  Continue STG/LTG    Patient Requires Continued Skilled Care to: Patient requires continued skilled Physical Therapy intervention to focus on range of motion/strength/pain to increase patients safety and independence with ADL/UE activities.    Plan: continue with further treatment to focus on ADL's, UE functioning, pain management, home exercise program  Continue per above Plan of care    Therapist  Signature: Earney Hamburg, PT, DPT, NCS     [place] # [phone]

## 2012-07-01 ENCOUNTER — Ambulatory Visit
Admission: RE | Admit: 2012-07-01 | Discharge: 2012-07-01 | Disposition: A | Discharge status: Home / Self Care | Payer: Medicare Other | Source: Ambulatory Visit | Attending: Physical Medicine & Rehabilitation | Admitting: Physical Medicine & Rehabilitation

## 2012-07-01 DIAGNOSIS — R262 Difficulty in walking, not elsewhere classified: Secondary | ICD-10-CM | POA: Insufficient documentation

## 2012-07-01 NOTE — Progress Notes (Signed)
[facility]  Physical Therapy Daily Note    Date: 07/01/2012  Patient: Darlene Landry  MR#: [id number]  Visit #: 25/30 (threshold at 33)    Start Time: 1305 to Stop Time: 1400  Total Treatment Time: 53    Treatment Code # of Minutes Treatment Code untimed   Therapeutic Exercise 23 PT Evaluation    Neuromuscular Re-Ed 15 PT Re-Evaluation    Manual Therapy 15 Canalith Repositioning    Therapeutic Activities              Subjective: Patient is agreeable to participation in the therapy session.   patient returned from vacation, reports compliance with HEP throughout    Objective  Therapeutic Exercise:   Isometric holds for shoulder flexion/abduction/scaption from 70 to 90 degree range with MC/A for increased external rotation and elbow extension. Manual stretch with contract relax for increased right shoulder flexion/abduction/ER.   Manual Therapy:   Functional mobilization supine and seated to increase shoulder flexion/abd/ER. Soft tissue work/MFR for increased flexion/abd/ER/IR and decreased complaints of pain in right shoulder with upper extremity reach.   Neuromuscular Rehabilitation:   Right UE reaching activities forward, diagnoal and laterally in sitting with overpressure/MC/A. PNF scapular patterns with focus on posterior depression - rhythmic initiation, reversals, COI.    Education/Home Exercise Program:  Continue with current HEP    Assessment:   Right UE - shoulder flexion 142, external rotation 58, abduction 120 - in supine.   Left UE - WFL.   Strength:   AROM: Seated - Right shoulder flexion 80, abduction 85, scaption 85 external rotation 48.     Progress Toward Functional Goals: progressing toward ROM goals  Continue STG/LTG    Patient Requires Continued Skilled Care to: Patient requires continued skilled Physical Therapy intervention to focus on strength, range of motion, pain relief to increase patients safety and independence with ADL.    Plan: continue with further treatment to focus on   ADL's, UE functioning, pain management, home exercise program  Continue per above Plan of care    Therapist Signature: Earney Hamburg, PT, DPT, NCS     [place] # [phone]

## 2012-07-06 ENCOUNTER — Ambulatory Visit
Admission: RE | Admit: 2012-07-06 | Discharge: 2012-07-06 | Disposition: A | Discharge status: Home / Self Care | Payer: Medicare Other | Source: Ambulatory Visit

## 2012-07-06 NOTE — Progress Notes (Signed)
[facility]  Physical Therapy Daily Note    Date: 07/06/2012  Patient: Darlene Landry  MR#: [id number]  Visit #: 26/30 (threshold at 33)    Start Time: 1300 to Stop Time: 1400  Total Treatment Time: 54    Treatment Code # of Minutes Treatment Code untimed   Therapeutic Exercise 14 PT Evaluation    Neuromuscular Re-Ed 16 PT Re-Evaluation    Manual Therapy 24 Canalith Repositioning    Therapeutic Activities              Subjective: Patient is agreeable to participation in the therapy session.   s/p fall yesterday at the bottom of the stairs.    Objective  Therapeutic Exercise:   Isometric holds for shoulder flexion/abduction/scaption from 80 to 95 degree range with MC/A for increased external rotation and elbow extension. Manual stretch with contract relax for increased right shoulder flexion/abduction/ER.     Manual Therapy:   Functional mobilization supine and seated to increase shoulder flexion/abd/ER. Soft tissue work/MFR for increased flexion/abd/ER/IR and decreased complaints of pain in right shoulder.     Neuromuscular Rehabilitation:   Right UE reaching activities forward, diagnoal and laterally in sitting (and simulated in sidelying/supine) with overpressure/MC/A. PNF scapular patterns with focus on posterior depression - rhythmic initiation, reversals, COI.    Education/Home Exercise Program:  Continue with current HEP    Assessment: Increased spasm along paraspinals and right scapula resulting in decreased ROM today.  Given ice pack after session.    Progress Toward Functional Goals: overall progress toward ROM goals  Continue STG/LTG    Patient Requires Continued Skilled Care to: Patient requires continued skilled Physical Therapy intervention to focus on range of motion, strength, pain relief to increase patients safety and independence with ADL.    Plan: continue with further treatment to focus on bed mobility, transfers, ADL's, UE functioning, pain management, home exercise program  Continue  per above Plan of care    Therapist Signature: Earney Hamburg, PT, DPT, NCS      [place] # [phone]

## 2012-07-08 ENCOUNTER — Ambulatory Visit
Admission: RE | Admit: 2012-07-08 | Discharge: 2012-07-08 | Disposition: A | Discharge status: Home / Self Care | Payer: Medicare Other | Source: Ambulatory Visit

## 2012-07-08 NOTE — Progress Notes (Signed)
[facility]   Physical Therapy Daily Note   Date: 07/08/2012   Patient: Darlene Landry   MR#: [id number]   Visit #: 27/30 (threshold at 33)   Start Time: 1305 to Stop Time: 1400   Total Treatment Time: 53     Treatment Code  # of Minutes  Treatment Code  untimed    Therapeutic Exercise  18 PT Evaluation     Neuromuscular Re-Ed  10 PT Re-Evaluation     Manual Therapy  25 Canalith Repositioning     Therapeutic Activities               Subjective: Patient is agreeable to participation in the therapy session. Pt reports fall on Monday (fell backward while descending steps); hit right-side back (T & L segments); pain is resolving.  Pain Right shoulder: 1-4/10 during treatment. Pt declined ice to right shoulder after treatment.     Objective   Therapeutic Exercise:   Manual stretch with contract relax for increased right shoulder flexion/abduction/ER.     Manual Therapy:   Functional mobilization supine to increase shoulder flexion/abd/ER. Gentle glenohumeral joint distractions and Inferior, superior and posterior capsule glides: grade I and II.     Neuromuscular Rehabilitation:   Right UE PNF D1 and D2 patterns with minimal resistance or at times active assist to increase ROM, throughout the range with focus on different muscle/range of motion segments.      Education/Home Exercise Program: Continue with current HEP     Assessment: Passive Right UE - shoulder flexion 145.  Progress Toward Functional Goals: progressing toward range goals, still struggling with strength   Continue STG/LTG     Patient Requires Continued Skilled Care to: Patient requires continued skilled Physical Therapy intervention to focus on range of motion/strength to increase patients safety and independence with mobility and ADL tasks.     Plan: continue with further treatment to focus on balance, gait, community mobility, bed mobility, transfers, ADL's, UE functioning, pain management, home exercise program   Continue per above Plan of  care     Therapist Signature: Thompson Caul, PT, DPT [place] # [phone]

## 2012-07-13 ENCOUNTER — Ambulatory Visit
Admission: RE | Admit: 2012-07-13 | Discharge: 2012-07-13 | Disposition: A | Discharge status: Home / Self Care | Payer: Medicare Other | Source: Ambulatory Visit

## 2012-07-13 NOTE — Progress Notes (Signed)
[facility]  Physical Therapy Daily Note    Date: 07/13/2012  Patient: Darlene Landry  MR#: [id number]  Visit #: 28/30 (threshold at 33)     Start Time: 1305 to Stop Time: 1400  Total Treatment Time: 53    Treatment Code # of Minutes Treatment Code untimed   Therapeutic Exercise 16 PT Evaluation    Neuromuscular Re-Ed 13 PT Re-Evaluation    Manual Therapy 24 Canalith Repositioning    Therapeutic Activities              Subjective: Patient is agreeable to participation in the therapy session.   c/o back spasms since fall    Objective  Therapeutic Exercise:   Isometric holds for shoulder flexion/abduction/scaption from 80 to 95 degree range with MC/A for increased external rotation and elbow extension. Manual stretch with contract relax for increased right shoulder flexion/abduction/ER.  Trial of reach with wedge pillow/standing and sitting.    Manual Therapy:   Functional mobilization supine and seated to increase shoulder flexion/abd/ER. Soft tissue work/MFR for increased flexion/abd/ER/IR and decreased complaints of pain in right shoulder.     Neuromuscular Rehabilitation:   Trial of NMES to facilitate right UE reaching activities     Education/Home Exercise Program:  Continue with current HEP    Assessment: minimal reaction to NMES today    Progress Toward Functional Goals: STG #2  Continue STG/LTG    Patient Requires Continued Skilled Care to: Patient requires continued skilled Physical Therapy intervention to focus on range of motion/strength/pain relief to increase patients safety and independence with ADL.    Plan: continue with further treatment to focus on ADL's, UE functioning, pain management, home exercise program  Continue per above Plan of care    Therapist Signature: Earney Hamburg, PT, DPT, NCS     [place] # [phone]

## 2012-07-15 ENCOUNTER — Ambulatory Visit
Admission: RE | Admit: 2012-07-15 | Discharge: 2012-07-15 | Disposition: A | Discharge status: Home / Self Care | Payer: Medicare Other | Source: Ambulatory Visit

## 2012-07-15 NOTE — Progress Notes (Signed)
[facility]  Physical Therapy Daily Note    Date: 07/15/2012  Patient: Darlene Landry  MR#: [id number]  Visit #: 29/30 (threshold at 33)     Start Time: 1305 to Stop Time: 1400  Total Treatment Time: 53    Treatment Code # of Minutes Treatment Code untimed   Therapeutic Exercise 23 PT Evaluation    Neuromuscular Re-Ed 14 PT Re-Evaluation    Manual Therapy 16 Canalith Repositioning    Therapeutic Activities              Subjective: Patient is agreeable to participation in the therapy session.   reports significant pain in low back after last treatment    Objective  Therapeutic Exercise:   Isometric holds for shoulder flexion/abduction/scaption from 80 to 95 degree range with MC/A for increased external rotation and elbow extension - use of wedge to facilitate. Manual stretch with contract relax for increased right shoulder flexion/abduction/ER. Trial of reach with sliderail standing and sitting.     Manual Therapy:   Functional mobilization supine and seated to increase shoulder flexion/abd/ER. Soft tissue work/MFR for increased flexion/abd/ER/IR and decreased complaints of pain in right shoulder.     Neuromuscular Rehabilitation:   Right UE reaching activities forward, diagnoal and laterally in sitting (and simulated in sidelying/supine) with overpressure/MC/A. PNF scapular patterns with focus on posterior depression - rhythmic initiation, reversals, COI.    Education/Home Exercise Program:  Working towards additional HEP for AROM shoulder flexion -     Assessment: ER PROM to 55    Progress Toward Functional Goals: met STG #2  Continue STG/LTG    Patient Requires Continued Skilled Care to: Patient requires continued skilled Physical Therapy intervention to focus on strength/range of motion/pain relieff to increase patients safety and independence with ADL.    Plan: continue with further treatment to focus on ADL's, UE functioning, pain management, home exercise program  Continue per above Plan of  care    Therapist Signature: Earney Hamburg, PT, DPT, NCS     [place] # [phone]

## 2012-07-20 ENCOUNTER — Ambulatory Visit: Payer: Medicare Other

## 2012-07-22 ENCOUNTER — Ambulatory Visit
Admission: RE | Admit: 2012-07-22 | Discharge: 2012-07-22 | Disposition: A | Discharge status: Home / Self Care | Payer: Medicare Other | Source: Ambulatory Visit | Attending: Physical Medicine & Rehabilitation | Admitting: Physical Medicine & Rehabilitation

## 2012-07-22 DIAGNOSIS — R262 Difficulty in walking, not elsewhere classified: Secondary | ICD-10-CM | POA: Insufficient documentation

## 2012-07-22 NOTE — PT Plan of Care Note (Signed)
[facility]   [address]  [place], [place] [address]  Tel [phone]  Fax [phone]    PHYSICAL THERAPY PROGRESS REPORT      PATIENT: Darlene Landry   Referred By: Everlean Alstrom,*  DOB: Nov 25, 1940    Medical Record #: [id number]  AGE: 71 y.o.     Dates of service: 03/04/12 to present    Facility Provider #: [id number]   Dates of Certification: 07/22/2012 to 09/09/12  Number of visits: 30    HICN#: Medicare Sub. 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 Reports decreased frequency of pain and decreased extent of pain.  Musculoskeletal Examination   PROM:   Right UE - shoulder flexion 150, external rotation 55, abduction 118 - in supine.   Left UE - WFL.     Strength:   AROM: Seated - Right shoulder flexion 80, abduction 88, scaption 88 external rotation 47. Patient is now able to perform isometric contractions between 75 and 90 degrees and hold with less assistance but unable to perform the range actively.   AROM: Supine - Right shoulder flexion 110 with increased ease in maintaining elbow extension, abduction 95, external rotation 40.     DASH: score = 39. A higher score (0-100) indicates greater disability.     Functional Mobility: Still with great difficulty reaching into cabinets/don-doff of overhead clothing and jackets. Reach for objects on counter/table continue to be performed with increased ease. Able to place casserole dish into oven and remove it now independently with bilateral UE without fear of dropping it. Using right UE to turn on/off light switch and to turn on/off water at the sink.     Bed Mobility: Min A secondary difficulty using right UE.      Assessment: Darlene Landry is a 71 y.o. female has been seen in our outpatient clinic department to address deficits of decreased range of motion, decreased strength, decreased static/dynamic balance, increased pain, decreased motor control, and increased tone resulting in decreased safety and independence with functional mobility.  Patient has demonstrated progress in these areas yet continues to have limitations with forward reach/ADL.  Darlene Landry is demonstrating good potential to progress towards goals.  Darlene Landry requires continued skilled Physical Therapy intervention to maximize patients independence with ADL.  Patient has met prior short term goals # 1, and 2 with progress made toward remainder of goals.  Updated goals written below.      G Codes:    Carrying, Moving, Handling Objects G Code Set  Carry/Move Objects Current Status [phone]): At least 40 percent but less than 60 percent impaired, limited or restricted  Carry/Move Objects, Goal Status [phone]): At least 20 percent but less than 40 percent impaired, limited or restricted  Tools used to determine level of impairment: Quick DASH     Plan:  Therapeutic Activities, Gait training, Retail banker, Therapeutic Exercise, Location manager, Neuromuscular Re-education, Functional Mobility Training, Home Exercise Program, Manual Therapy, and Electrical Stimulation.    Frequency of treatment:  1 times per week for 6 weeks.    Updated Short Term Goals:  4 weeks  (STG = LTG)  1) Increase right shoulder flexion PROM  to 155 in supine to facilitate reaching into cabinet.    2) Increase AROM right shoulder flexion in sitting/standing position to 90 degrees.  3) Increase right shoulder external rotation PROM to 65 in supine to facilitate donning of jacket/shirt.  4) Increase AROM right shoulder flexion in supine to 115.    Therapist's Signature:  Earney Hamburg, PT, DPT, NCS     [place] # [phone]        Physician's  Signature:      Date:

## 2012-07-22 NOTE — Progress Notes (Signed)
[facility]  Physical Therapy Daily Note    Date: 07/22/2012  Patient: Darlene Landry  MR#: [id number]  Visit #: 30/30 (threshold at 33)     Start Time: 1405 to Stop Time: 1500  Total Treatment Time: 53    Treatment Code # of Minutes Treatment Code untimed   Therapeutic Exercise 23 PT Evaluation    Neuromuscular Re-Ed 15 PT Re-Evaluation    Manual Therapy 15 Canalith Repositioning    Therapeutic Activities              Subjective: Patient is agreeable to participation in the therapy session.   I think the arm glide is helping    Objective  Therapeutic Exercise:   Isometric holds for shoulder flexion/abduction/scaption from 80 to 95 degree range with MC/A for increased external rotation and elbow extension - use of wedge to facilitate. Manual stretch with contract relax for increased right shoulder flexion/abduction/ER. Reach activities forward and lateral with sliderail in standing and sitting.     Manual Therapy:   Functional mobilization supine and seated to increase shoulder flexion/abd/ER. Soft tissue work/MFR for increased flexion/abd/ER/IR and decreased complaints of pain in right shoulder.     Neuromuscular Rehabilitation:   Right UE reaching activities forward, diagnoal and laterally in sitting (and simulated in sidelying/supine) with overpressure/MC/A. PNF scapular patterns with focus on posterior depression - rhythmic initiation, reversals, COI.    Education/Home Exercise Program:  Altered shoulder flexion/reach exercise    Assessment: See updated POC written today.    Therapist Signature: Earney Hamburg, PT, DPT, NCS     [place] # [phone]

## 2012-07-29 ENCOUNTER — Ambulatory Visit
Admission: RE | Admit: 2012-07-29 | Discharge: 2012-07-29 | Disposition: A | Discharge status: Home / Self Care | Payer: Medicare Other | Source: Ambulatory Visit

## 2012-07-29 ENCOUNTER — Ambulatory Visit: Payer: Medicare Other

## 2012-07-29 NOTE — Progress Notes (Signed)
[facility]  Physical Therapy Daily Note    Date: 07/29/2012  Patient: Darlene Landry  MR#: [id number]  Visit #: 31/33    Start Time: 1405 to Stop Time: 1500  Total Treatment Time: 53    Treatment Code # of Minutes Treatment Code untimed   Therapeutic Exercise 24 PT Evaluation    Neuromuscular Re-Ed 10 PT Re-Evaluation    Manual Therapy 19 Canalith Repositioning    Therapeutic Activities              Subjective: Patient is agreeable to participation in the therapy session.   I'm getting a driving test tomorrow.    Objective  Therapeutic Exercise:   Isometric holds for shoulder flexion/abduction/scaption from 80 to 95 degree range with MC/A for increased external rotation and elbow extension - use of pole glide to facilitate timing. Manual stretch with contract relax for increased right shoulder flexion/abduction/ER. PNF scapular patterns with focus on anterior elevation/posterior depression - rhythmic initiation, reversals, combination of isotonics.     Manual Therapy:   Functional mobilization supine and seated to increase shoulder flexion/abd/ER. Soft tissue work/MFR for increased flexion/abd/ER/IR and decreased complaints of pain in right shoulder.     Neuromuscular Rehabilitation:   Right UE reaching activities forward, diagnoal and laterally in sitting (and simulated in sidelying/supine) with overpressure/MC/A. PNF scapular patterns with focus on posterior depression - rhythmic initiation, reversals, COI.    Education/Home Exercise Program: altered HEP    Assessment: After above treatment patient able to reach to 85 with elbow extended throughout.      Progress Toward Functional Goals: progressing toward AROM goals  Continue STG/LTG    Patient Requires Continued Skilled Care to: Patient requires continued skilled Physical Therapy intervention to focus on strength/range of motion/pain to increase patients safety and independence with ADL.    Plan: continue with further treatment to focus on ADL's, UE  functioning, pain management, home exercise program  Continue per above Plan of care    Therapist Signature: Earney Hamburg, PT, DPT, NCS      # [phone]

## 2012-07-31 NOTE — Final Progress Note (DC Note for stay less than 48 (Addendum)
[facility] [place] [facility]   [address]  [place], [place] [address]  Tel [phone]  Fax [phone]    PHYSICAL THERAPY DISCHARGE SUMMARY      PATIENT: Darlene Landry   Referred By: Everlean Alstrom,*  DOB: Nov 11, 1940    Medical Record #: [id number]  AGE: 71 y.o.     Dates of Treatment: 03/04/12 to 08/12/12     Facility Provider #: [id number]   Number of Visits: 33   HICN#: Medicare Sub. 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 Reports overall decreased frequency of pain and decreased extent of pain.     Musculoskeletal Examination   PROM:   Right UE - shoulder flexion 150, external rotation 55, abduction 124 - in supine.   Left UE - WFL.   Strength:   AROM: Seated - Right shoulder flexion 84, abduction 88, scaption 88 external rotation 48. Patient is now able to perform isometric contractions between 75 and 90 degrees and hold with less assistance but unable to perform the range actively. Struggles to maintain elbow extension and forearm supination during reach.  AROM: Supine - Right shoulder flexion 112 with increased ease in maintaining elbow extension, abduction 95, external rotation 44.     DASH: score = 39. A higher score (0-100) indicates greater disability.     Functional Mobility: Still with difficulty reaching into cabinets.  Able to don-doff overhead clothing and jackets with increased time/effort.  Reach for objects on counter/table continue to be performed with increased ease. Increased ease with kitchen activities with reaching for objects on counter/table/oven.     Bed Mobility: Min A secondary difficulty using right UE.     Education and training:  Darlene Landry  has been instructed in home exercise program for active and passive range of motion for shoulder flexion/abduction/scaption/external rotation  and techniques for pain reduction.  Patient and spouse demonstrate good understanding.     G Codes: Addendum 10/10/12 - correction to G codes - Earney Hamburg, PT  Carrying, Moving, Handling Objects G Code Set   Carry/Move Objects Discharge Status [phone]): At least 40 percent but less than 60 percent impaired, limited or restricted   Carry/Move Objects, Goal Status [phone]): At least 20 percent but less than 40 percent impaired, limited or restricted   Tools used to determine level of impairment: Quick DASH    Assessment: Darlene Landry has demonstrated good progress with range of motion, motor control, and strength resulting in increased safety and independence with functional activities and ADL.  Darlene Landry has reached maximal benefit from therapy at this time.  Continues to struggle with active shoulder motions especially in combination with elbow extension and forearm supination.  Patient is using her arm in daily function much more often then previously.    Darlene Landry no longer requires skilled Physical Therapy intervention as She is able to continue to progress independently at home with current home exercise program.     Plan:  Discharge from outpatient physical therapy with instructions to continue with home exercise program.      Therapist's Signature: Earney Hamburg, PT, DPT, NCS     [place] # [id number]

## 2012-08-05 ENCOUNTER — Ambulatory Visit
Admission: RE | Admit: 2012-08-05 | Discharge: 2012-08-05 | Disposition: A | Discharge status: Home / Self Care | Payer: Medicare Other | Source: Ambulatory Visit

## 2012-08-05 NOTE — Progress Notes (Signed)
[facility]  Physical Therapy Daily Note    Date: 08/05/2012  Patient: Darlene Landry  MR#: [id number]  Visit #: 32/33    Start Time: 1305 to Stop Time: 1400  Total Treatment Time: 53    Treatment Code # of Minutes Treatment Code untimed   Therapeutic Exercise 27 PT Evaluation    Neuromuscular Re-Ed 10 PT Re-Evaluation    Manual Therapy 16 Canalith Repositioning    Therapeutic Activities              Subjective: Patient is agreeable to participation in the therapy session.   The exercises are taking me about 45 minutes now.  The arm slide really works now.    Objective  Therapeutic Exercise:   Manual stretch with contract relax for increased right shoulder flexion/abduction/ER. PNF scapular patterns with focus on anterior elevation/posterior depression - rhythmic initiation, reversals, combination of isotonics.  Review/clarification of current HEP - patient and husband both demonstrate good understanding    Manual Therapy:   Functional mobilization supine and seated to increase shoulder flexion/abd/ER. Soft tissue work/MFR for increased flexion/abd/ER/IR and decreased complaints of pain in right shoulder.     Neuromuscular Rehabilitation:   PNF scapular patterns with focus on posterior depression - rhythmic initiation, reversals, COI.    Education/Home Exercise Program:  Reviewed current HEP, clarified lower trunk rotation when performing shoulder flexion in supine.    Assessment: Shoulder PROM flexion = 150.      Progress Toward Functional Goals: progressing toward range goals  Continue STG/LTG    Patient Requires Continued Skilled Care to: Patient requires continued skilled Physical Therapy intervention to focus on range of motion/strength to increase patients safety and independence with ADL.    Plan: continue with further treatment to focus on ADL's, UE functioning, pain management, home exercise program  Continue per above Plan of care    Therapist Signature: Earney Hamburg, PT, DPT, NCS     [place] #  [phone]

## 2012-08-12 ENCOUNTER — Ambulatory Visit
Admission: RE | Admit: 2012-08-12 | Discharge: 2012-08-12 | Disposition: A | Discharge status: Home / Self Care | Payer: Medicare Other | Source: Ambulatory Visit

## 2012-08-12 NOTE — Progress Notes (Signed)
[facility]  Physical Therapy Daily Note    Date: 08/12/2012  Patient: Darlene Landry  MR#: [id number]  Visit #: 33/33    Start Time: 1305 to Stop Time: 1400  Total Treatment Time: 53    Treatment Code # of Minutes Treatment Code untimed   Therapeutic Exercise 27 PT Evaluation    Neuromuscular Re-Ed  PT Re-Evaluation    Manual Therapy 26 Canalith Repositioning    Therapeutic Activities              Subjective: Patient is agreeable to participation in the therapy session.   I understand all the exercises that I need to do.    Objective  Therapeutic Exercise:   Manual stretch with contract relax for increased right shoulder flexion/abduction/ER. PNF scapular patterns with focus on anterior elevation/posterior depression - rhythmic initiation, reversals, combination of isotonics. Review/clarification of final HEP - patient and husband both demonstrate good understanding .  Isometrics at end ranges for shoulder flexion, abduction, ER.  Reversals shoulder flex/ext and IR/ER.    Manual Therapy:   Functional mobilization supine and seated to increase shoulder flexion/abd/ER. Soft tissue work/MFR for increased flexion/abd/ER/IR and decreased complaints of pain in right shoulder.     Education/Home Exercise Program:  Finalized/corrected techniques for final HEP    Assessment: Discharge from outpatient PT, see D/C summary dated today for final functional status.    Therapist Signature: Earney Hamburg, PT, DPT, NCS     [place] # [phone]

## 2012-08-19 ENCOUNTER — Ambulatory Visit: Payer: Medicare Other

## 2014-07-11 ENCOUNTER — Encounter (INDEPENDENT_AMBULATORY_CARE_PROVIDER_SITE_OTHER): Payer: Self-pay | Admitting: Neurological Surgery

## 2015-06-08 DIAGNOSIS — F419 Anxiety disorder, unspecified: Secondary | ICD-10-CM | POA: Insufficient documentation

## 2015-06-09 ENCOUNTER — Encounter (INDEPENDENT_AMBULATORY_CARE_PROVIDER_SITE_OTHER): Payer: Self-pay

## 2015-06-12 ENCOUNTER — Ambulatory Visit: Payer: Medicare Other

## 2015-06-12 NOTE — Patient Care Conference (Signed)
[facility]   Joint Replacement Pre-Assessment    Patient: Darlene Landry     MRN#: [id number]   Date: 06/12/2015    Darlene Landry is a 74 y.o. female scheduled  for elective left TKR by Dr. Reynolds Bowl on 06/19/15.      Current Assistive Device Used:  Single point cane  What's the longest you can walk without taking a break? (in feet):  Community distance  How much do you walk on an average day?: Household  Level of Assistance needed with ADLs: Independent with all self care tasks.  Home Environment:  House  Stair and rail set up: Yes, and the stairs have railing(s).  Support(s) at Home My spouse lives at home with me and will help.   Do you have a designated Coach? Yes  Equipment at Home: straight cane  Goal on D/C: Home Out- pt discussed with pt  > Have you been to a acute rehab facility in the past? When and name? no  > Have you been to a SNF in the past? When and name? No  > Have you received home health care services in the past? When and name of agency? Yes, cannot remember  Surgeon discussed length of stay with pt ? Yes / No   How many days?    Pain level prior to Surgery : 7/10  Comfort Function Goal: 5/10    Risk Assessment And Prediction Toll ( RAPT )                                   Assessment Questions Value Score    / Your Score   1. What is your age group? 50-65 Years  66-75 Years  > 75   Years    = 2           = 1   = 0    Your Score = 1   2. Gender Female  Female    = 2   = 1  Your Score = 1   3. How far on average can you walk?  Two blocks or more ( + / - Rest )  1 - 2 blocks ( + / - Rest )  Housebound ( most of the times )    = 2   = 1     = 0  Your Score = 0   4. Which Gait Aid do you use?  None  Single - Point Stick  Crutches / Frame  = 2   = 1   = 0  Your Score = 1   5. Do you use community supports ? ( home help, meals on wheels, district nursing )  None or one per week  Two or more per week  = 1   = 0    Your Score = 1   6. Will you live with some who can care for you after the  operation ? Yes  No   = 3   = 0   Your Score = 3            Total Score ( Out of 12 )  = 7     Key: Destination at discharge from acute care predicted by score.     1.  Scores <6 -- extended inpatient rehabilitation  2. Score 6-9 -- additional intervention to discharge directly home (e.g.    Rehabilitation in the Home)      3. Score >9 -- directly home.    Additional Notes:    Declined class attendance. She stated, had TKR 4 1/2 years ago. Agreed to meet and have one on one education on 06/12/15 with coach and have blood type and cross-matching as well.   Discussed preparation prior to surgery, what to expect during and after surgery. Emphasized importance of prevention of possible complications after surgery, coach participation and involvement during pre-operative, day of surgery, and post-operative care.      Rory Percy RN  Patient  Care Navigator   [facility]  [phone]

## 2015-06-12 NOTE — Pre-Procedure Instructions (Signed)
Pt is coming in today for T&C and to meet w/ nurse Steele Sizer.

## 2015-06-15 ENCOUNTER — Ambulatory Visit
Admission: RE | Admit: 2015-06-15 | Discharge: 2015-06-15 | Disposition: A | Discharge status: Home / Self Care | Payer: Medicare Other | Source: Ambulatory Visit | Attending: Orthopaedic Surgery | Admitting: Orthopaedic Surgery

## 2015-06-15 DIAGNOSIS — M1712 Unilateral primary osteoarthritis, left knee: Secondary | ICD-10-CM | POA: Insufficient documentation

## 2015-06-15 LAB — TYPE AND SCREEN
AB Screen Gel: NEGATIVE
ABO Rh: A POS

## 2015-06-19 ENCOUNTER — Inpatient Hospital Stay: Payer: Medicare Other | Admitting: Anesthesiology

## 2015-06-19 ENCOUNTER — Inpatient Hospital Stay: Payer: Medicare Other | Admitting: Orthopaedic Surgery

## 2015-06-19 ENCOUNTER — Encounter
Admission: RE | Disposition: A | Discharge status: Home Health | Payer: Self-pay | Source: Ambulatory Visit | Attending: Orthopaedic Surgery

## 2015-06-19 ENCOUNTER — Inpatient Hospital Stay
Admission: RE | Admit: 2015-06-19 | Discharge: 2015-06-20 | DRG: 470 | Disposition: A | Discharge status: Home Health | Payer: Medicare Other | Source: Ambulatory Visit | Attending: Orthopaedic Surgery | Admitting: Orthopaedic Surgery

## 2015-06-19 ENCOUNTER — Ambulatory Visit: Payer: Self-pay

## 2015-06-19 DIAGNOSIS — R51 Headache: Secondary | ICD-10-CM | POA: Diagnosis present

## 2015-06-19 DIAGNOSIS — R519 Headache, unspecified: Secondary | ICD-10-CM | POA: Diagnosis present

## 2015-06-19 DIAGNOSIS — Z96651 Presence of right artificial knee joint: Secondary | ICD-10-CM | POA: Diagnosis present

## 2015-06-19 DIAGNOSIS — M1712 Unilateral primary osteoarthritis, left knee: Principal | ICD-10-CM | POA: Diagnosis present

## 2015-06-19 DIAGNOSIS — F419 Anxiety disorder, unspecified: Secondary | ICD-10-CM | POA: Diagnosis present

## 2015-06-19 DIAGNOSIS — I10 Essential (primary) hypertension: Secondary | ICD-10-CM | POA: Diagnosis present

## 2015-06-19 DIAGNOSIS — Z7982 Long term (current) use of aspirin: Secondary | ICD-10-CM

## 2015-06-19 DIAGNOSIS — E039 Hypothyroidism, unspecified: Secondary | ICD-10-CM | POA: Diagnosis present

## 2015-06-19 DIAGNOSIS — E785 Hyperlipidemia, unspecified: Secondary | ICD-10-CM | POA: Diagnosis present

## 2015-06-19 LAB — HEMOGLOBIN AND HEMATOCRIT, BLOOD
Hematocrit: 34.1 % — ABNORMAL LOW (ref 37.0–47.0)
Hgb: 11.3 g/dL — ABNORMAL LOW (ref 12.0–16.0)

## 2015-06-19 SURGERY — ARTHROPLASTY, KNEE, TOTAL, MINIMALLY INVASIVE
Anesthesia: Regional | Site: Knee | Laterality: Left | Wound class: Clean

## 2015-06-19 MED ORDER — PROPOFOL 10 MG/ML IV EMUL (WRAP)
INTRAVENOUS | Status: AC
Start: 2015-06-19 — End: ?
  Filled 2015-06-19: qty 50

## 2015-06-19 MED ORDER — CELECOXIB 200 MG PO CAPS
200.0000 mg | ORAL_CAPSULE | Freq: Two times a day (BID) | ORAL | Status: DC
Start: 2015-06-19 — End: 2015-06-20

## 2015-06-19 MED ORDER — FENTANYL CITRATE (PF) 50 MCG/ML IJ SOLN (WRAP)
INTRAMUSCULAR | Status: DC | PRN
Start: 2015-06-19 — End: 2015-06-19
  Administered 2015-06-19 (×2): 50 ug via INTRAVENOUS

## 2015-06-19 MED ORDER — CEFAZOLIN SODIUM 1 G IJ SOLR
1.0000 g | Freq: Three times a day (TID) | INTRAMUSCULAR | Status: AC
Start: 2015-06-19 — End: 2015-06-20
  Administered 2015-06-19 – 2015-06-20 (×2): 1 g via INTRAVENOUS
  Filled 2015-06-19: qty 100
  Filled 2015-06-19: qty 1000
  Filled 2015-06-19: qty 100
  Filled 2015-06-19: qty 1000

## 2015-06-19 MED ORDER — MIDAZOLAM HCL 2 MG/2ML IJ SOLN
INTRAMUSCULAR | Status: AC
Start: 2015-06-19 — End: ?
  Filled 2015-06-19: qty 2

## 2015-06-19 MED ORDER — PREGABALIN 75 MG PO CAPS
150.0000 mg | ORAL_CAPSULE | ORAL | Status: AC
Start: 2015-06-19 — End: 2015-06-19
  Administered 2015-06-19: 150 mg via ORAL

## 2015-06-19 MED ORDER — FERROUS SULFATE 324 (65 FE) MG PO TBEC
324.0000 mg | DELAYED_RELEASE_TABLET | Freq: Every morning | ORAL | Status: DC
Start: 2015-06-19 — End: 2015-06-20
  Administered 2015-06-19 – 2015-06-20 (×2): 324 mg via ORAL
  Filled 2015-06-19 (×2): qty 1

## 2015-06-19 MED ORDER — OXYCODONE-ACETAMINOPHEN 7.5-325 MG PO TABS
1.0000 | ORAL_TABLET | ORAL | Status: AC | PRN
Start: 2015-06-19 — End: 2015-06-29

## 2015-06-19 MED ORDER — NALOXONE HCL 0.4 MG/ML IJ SOLN
0.4000 mg | INTRAMUSCULAR | Status: DC | PRN
Start: 2015-06-19 — End: 2015-06-20

## 2015-06-19 MED ORDER — HYDROMORPHONE HCL 1 MG/ML IJ SOLN
0.5000 mg | INTRAMUSCULAR | Status: DC | PRN
Start: 2015-06-19 — End: 2015-06-19

## 2015-06-19 MED ORDER — ROPIVACAINE HCL 5 MG/ML IJ SOLN
INTRAMUSCULAR | Status: AC
Start: 2015-06-19 — End: ?
  Filled 2015-06-19: qty 30

## 2015-06-19 MED ORDER — DIPHENHYDRAMINE HCL 25 MG PO CAPS
25.0000 mg | ORAL_CAPSULE | Freq: Two times a day (BID) | ORAL | Status: DC | PRN
Start: 2015-06-19 — End: 2015-06-20

## 2015-06-19 MED ORDER — TEMAZEPAM 15 MG PO CAPS
15.0000 mg | ORAL_CAPSULE | Freq: Every evening | ORAL | Status: DC | PRN
Start: 2015-06-19 — End: 2015-06-20

## 2015-06-19 MED ORDER — CEFAZOLIN SODIUM-DEXTROSE 2-3 GM-% IV SOLR
INTRAVENOUS | Status: AC
Start: 2015-06-19 — End: ?
  Filled 2015-06-19: qty 50

## 2015-06-19 MED ORDER — HYDROMORPHONE HCL 1 MG/ML IJ SOLN
0.5000 mg | INTRAMUSCULAR | Status: DC | PRN
Start: 2015-06-19 — End: 2015-06-20

## 2015-06-19 MED ORDER — OXYCODONE HCL 5 MG PO TABS
5.0000 mg | ORAL_TABLET | ORAL | Status: DC | PRN
Start: 2015-06-19 — End: 2015-06-20

## 2015-06-19 MED ORDER — ASPIRIN 81 MG PO TBEC
81.0000 mg | DELAYED_RELEASE_TABLET | Freq: Two times a day (BID) | ORAL | Status: DC
Start: 2015-06-19 — End: 2015-06-20
  Administered 2015-06-19 – 2015-06-20 (×2): 81 mg via ORAL
  Filled 2015-06-19 (×2): qty 1

## 2015-06-19 MED ORDER — ONDANSETRON HCL 4 MG/2ML IJ SOLN
4.0000 mg | Freq: Three times a day (TID) | INTRAMUSCULAR | Status: DC | PRN
Start: 2015-06-19 — End: 2015-06-20
  Administered 2015-06-19 – 2015-06-20 (×2): 4 mg via INTRAVENOUS
  Filled 2015-06-19 (×2): qty 2

## 2015-06-19 MED ORDER — DOCUSATE SODIUM 100 MG PO CAPS
100.0000 mg | ORAL_CAPSULE | Freq: Two times a day (BID) | ORAL | Status: DC
Start: 2015-06-19 — End: 2015-06-20
  Administered 2015-06-19 – 2015-06-20 (×2): 100 mg via ORAL
  Filled 2015-06-19 (×2): qty 1

## 2015-06-19 MED ORDER — LIDOCAINE HCL (PF) 2 % IJ SOLN
INTRAMUSCULAR | Status: AC
Start: 2015-06-19 — End: ?
  Filled 2015-06-19: qty 5

## 2015-06-19 MED ORDER — AMLODIPINE BESYLATE 5 MG PO TABS
2.5000 mg | ORAL_TABLET | Freq: Every evening | ORAL | Status: DC
Start: 2015-06-19 — End: 2015-06-20
  Administered 2015-06-19: 2.5 mg via ORAL
  Filled 2015-06-19: qty 1

## 2015-06-19 MED ORDER — LIDOCAINE HCL 2 % IJ SOLN
INTRAMUSCULAR | Status: DC | PRN
Start: 2015-06-19 — End: 2015-06-19
  Administered 2015-06-19: 40 mg

## 2015-06-19 MED ORDER — LOSARTAN POTASSIUM 25 MG PO TABS
100.0000 mg | ORAL_TABLET | Freq: Every evening | ORAL | Status: DC
Start: 2015-06-19 — End: 2015-06-20
  Administered 2015-06-19: 100 mg via ORAL
  Filled 2015-06-19: qty 4

## 2015-06-19 MED ORDER — PROMETHAZINE HCL 25 MG/ML IJ SOLN
6.2500 mg | Freq: Once | INTRAMUSCULAR | Status: DC | PRN
Start: 2015-06-19 — End: 2015-06-19

## 2015-06-19 MED ORDER — TRANEXAMIC ACID 1000 MG/10ML IV SOLN
1000.0000 mg | Freq: Once | INTRAVENOUS | Status: AC
Start: 2015-06-19 — End: 2015-06-19
  Administered 2015-06-19: 12:00:00 1000 mg via INTRAVENOUS
  Filled 2015-06-19: qty 10

## 2015-06-19 MED ORDER — LEVOTHYROXINE SODIUM 75 MCG PO TABS
75.0000 ug | ORAL_TABLET | Freq: Every day | ORAL | Status: DC
Start: 2015-06-19 — End: 2015-06-20
  Administered 2015-06-20: 75 ug via ORAL
  Filled 2015-06-19: qty 1

## 2015-06-19 MED ORDER — ONDANSETRON 4 MG PO TBDP
4.0000 mg | ORAL_TABLET | Freq: Three times a day (TID) | ORAL | Status: DC | PRN
Start: 2015-06-19 — End: 2015-06-20

## 2015-06-19 MED ORDER — SODIUM CHLORIDE 0.45 % IV SOLN
INTRAVENOUS | Status: DC
Start: 2015-06-19 — End: 2015-06-20
  Administered 2015-06-19 – 2015-06-20 (×3): 100 mL/h via INTRAVENOUS

## 2015-06-19 MED ORDER — GABAPENTIN 300 MG PO CAPS
300.0000 mg | ORAL_CAPSULE | Freq: Three times a day (TID) | ORAL | Status: DC
Start: 2015-06-19 — End: 2015-06-20
  Administered 2015-06-19 – 2015-06-20 (×4): 300 mg via ORAL
  Filled 2015-06-19 (×4): qty 1

## 2015-06-19 MED ORDER — PROPOFOL INFUSION 10 MG/ML
INTRAVENOUS | Status: DC | PRN
Start: 2015-06-19 — End: 2015-06-19
  Administered 2015-06-19: 50 ug/kg/min via INTRAVENOUS

## 2015-06-19 MED ORDER — SODIUM CHLORIDE BACTERIOSTATIC 0.9 % IJ SOLN
INTRAMUSCULAR | Status: DC | PRN
Start: 2015-06-19 — End: 2015-06-19
  Administered 2015-06-19: 15 mL

## 2015-06-19 MED ORDER — DIPHENHYDRAMINE HCL 50 MG/ML IJ SOLN
12.5000 mg | Freq: Once | INTRAMUSCULAR | Status: DC | PRN
Start: 2015-06-19 — End: 2015-06-19

## 2015-06-19 MED ORDER — MAGNESIUM OXIDE 400 MG PO TABS
100.0000 mg | ORAL_TABLET | Freq: Every day | ORAL | Status: DC
Start: 2015-06-19 — End: 2015-06-20
  Administered 2015-06-19 – 2015-06-20 (×2): 100 mg via ORAL
  Filled 2015-06-19 (×2): qty 1

## 2015-06-19 MED ORDER — SODIUM CHLORIDE BACTERIOSTATIC 0.9 % IJ SOLN
INTRAMUSCULAR | Status: AC
Start: 2015-06-19 — End: ?
  Filled 2015-06-19: qty 30

## 2015-06-19 MED ORDER — OXYCODONE HCL ER 10 MG PO T12A
10.0000 mg | EXTENDED_RELEASE_TABLET | ORAL | Status: AC
Start: 2015-06-19 — End: 2015-06-19
  Administered 2015-06-19: 10 mg via ORAL

## 2015-06-19 MED ORDER — PROMETHAZINE HCL 25 MG RE SUPP
25.0000 mg | Freq: Four times a day (QID) | RECTAL | Status: DC | PRN
Start: 2015-06-19 — End: 2015-06-20

## 2015-06-19 MED ORDER — LACTATED RINGERS IV SOLN
50.0000 mL/h | INTRAVENOUS | Status: DC
Start: 2015-06-19 — End: 2015-06-19

## 2015-06-19 MED ORDER — PROMETHAZINE HCL 25 MG/ML IJ SOLN
6.2500 mg | Freq: Four times a day (QID) | INTRAMUSCULAR | Status: DC | PRN
Start: 2015-06-19 — End: 2015-06-20

## 2015-06-19 MED ORDER — LIDOCAINE HCL (PF) 1 % IJ SOLN
INTRAMUSCULAR | Status: AC
Start: 2015-06-19 — End: ?
  Filled 2015-06-19: qty 2

## 2015-06-19 MED ORDER — ROPIVACAINE HCL 5 MG/ML IJ SOLN
INTRAMUSCULAR | Status: DC | PRN
Start: 2015-06-19 — End: 2015-06-19
  Administered 2015-06-19: 30 mL

## 2015-06-19 MED ORDER — PAROXETINE HCL 10 MG PO TABS
10.0000 mg | ORAL_TABLET | Freq: Every evening | ORAL | Status: DC
Start: 2015-06-19 — End: 2015-06-20
  Administered 2015-06-19: 10 mg via ORAL
  Filled 2015-06-19 (×2): qty 1

## 2015-06-19 MED ORDER — EPHEDRINE SULFATE 50 MG/ML IJ SOLN
INTRAMUSCULAR | Status: AC
Start: 2015-06-19 — End: ?
  Filled 2015-06-19: qty 1

## 2015-06-19 MED ORDER — OXYCODONE-ACETAMINOPHEN 5-325 MG PO TABS
1.0000 | ORAL_TABLET | Freq: Once | ORAL | Status: DC | PRN
Start: 2015-06-19 — End: 2015-06-19

## 2015-06-19 MED ORDER — PREGABALIN 150 MG PO CAPS
ORAL_CAPSULE | ORAL | Status: AC
Start: 2015-06-19 — End: ?
  Filled 2015-06-19: qty 1

## 2015-06-19 MED ORDER — SODIUM CHLORIDE 0.9 % IV SOLN
1000.0000 mg | Freq: Once | INTRAVENOUS | Status: AC
Start: 2015-06-19 — End: 2015-06-19
  Administered 2015-06-19: 10:00:00 1000 mg via INTRAVENOUS
  Filled 2015-06-19: qty 10

## 2015-06-19 MED ORDER — SPIRONOLACTONE 25 MG PO TABS
25.0000 mg | ORAL_TABLET | Freq: Every morning | ORAL | Status: DC
Start: 2015-06-20 — End: 2015-06-20

## 2015-06-19 MED ORDER — POVIDONE-IODINE 5 % OP SOLN
OPHTHALMIC | Status: AC
Start: 2015-06-19 — End: ?
  Filled 2015-06-19: qty 30

## 2015-06-19 MED ORDER — PREGABALIN 75 MG PO CAPS
75.0000 mg | ORAL_CAPSULE | Freq: Two times a day (BID) | ORAL | Status: DC
Start: 2015-06-19 — End: 2015-06-19

## 2015-06-19 MED ORDER — CEFAZOLIN SODIUM-DEXTROSE 2-3 GM-% IV SOLR
2000.0000 mg | INTRAVENOUS | Status: DC
Start: 2015-06-19 — End: 2015-06-19
  Administered 2015-06-19: 2 g via INTRAVENOUS

## 2015-06-19 MED ORDER — PROMETHAZINE HCL 25 MG PO TABS
25.0000 mg | ORAL_TABLET | Freq: Four times a day (QID) | ORAL | Status: DC | PRN
Start: 2015-06-19 — End: 2015-06-20

## 2015-06-19 MED ORDER — EPINEPHRINE 1 MG/ML IJ SOLN
INTRAMUSCULAR | Status: DC | PRN
Start: 2015-06-19 — End: 2015-06-19
  Administered 2015-06-19: .25 mg via SUBCUTANEOUS

## 2015-06-19 MED ORDER — OXYCODONE HCL 5 MG PO TABS
10.0000 mg | ORAL_TABLET | ORAL | Status: DC | PRN
Start: 2015-06-19 — End: 2015-06-20
  Administered 2015-06-19 – 2015-06-20 (×8): 10 mg via ORAL
  Filled 2015-06-19 (×8): qty 2

## 2015-06-19 MED ORDER — FENTANYL CITRATE (PF) 50 MCG/ML IJ SOLN (WRAP)
25.0000 ug | INTRAMUSCULAR | Status: DC | PRN
Start: 2015-06-19 — End: 2015-06-19

## 2015-06-19 MED ORDER — SENNOSIDES-DOCUSATE SODIUM 8.6-50 MG PO TABS
2.0000 | ORAL_TABLET | Freq: Two times a day (BID) | ORAL | Status: DC | PRN
Start: 2015-06-19 — End: 2015-06-20

## 2015-06-19 MED ORDER — ONDANSETRON HCL 4 MG/2ML IJ SOLN
4.0000 mg | Freq: Once | INTRAMUSCULAR | Status: DC | PRN
Start: 2015-06-19 — End: 2015-06-19

## 2015-06-19 MED ORDER — VITAMIN C 500 MG PO TABS
500.0000 mg | ORAL_TABLET | Freq: Two times a day (BID) | ORAL | Status: DC
Start: 2015-06-19 — End: 2015-06-20
  Administered 2015-06-19 – 2015-06-20 (×2): 500 mg via ORAL
  Filled 2015-06-19 (×2): qty 1

## 2015-06-19 MED ORDER — OXYCODONE HCL ER 10 MG PO T12A
EXTENDED_RELEASE_TABLET | ORAL | Status: AC
Start: 2015-06-19 — End: ?
  Filled 2015-06-19: qty 1

## 2015-06-19 MED ORDER — MIDAZOLAM HCL 2 MG/2ML IJ SOLN
INTRAMUSCULAR | Status: DC | PRN
Start: 2015-06-19 — End: 2015-06-19
  Administered 2015-06-19 (×2): 1 mg via INTRAVENOUS

## 2015-06-19 MED ORDER — PROPOFOL INFUSION 10 MG/ML
INTRAVENOUS | Status: DC | PRN
Start: 2015-06-19 — End: 2015-06-19
  Administered 2015-06-19: 50 mg via INTRAVENOUS

## 2015-06-19 MED ORDER — ACETAMINOPHEN 325 MG PO TABS
650.0000 mg | ORAL_TABLET | Freq: Every day | ORAL | Status: DC
Start: 2015-06-19 — End: 2015-06-20
  Administered 2015-06-19 – 2015-06-20 (×7): 650 mg via ORAL
  Filled 2015-06-19 (×7): qty 2

## 2015-06-19 MED ORDER — PROPOFOL 10 MG/ML IV EMUL (WRAP)
INTRAVENOUS | Status: AC
Start: 2015-06-19 — End: ?
  Filled 2015-06-19: qty 20

## 2015-06-19 MED ORDER — EPINEPHRINE HCL 1 MG/ML IJ SOLN
INTRAMUSCULAR | Status: AC
Start: 2015-06-19 — End: ?
  Filled 2015-06-19: qty 1

## 2015-06-19 MED ORDER — FENTANYL CITRATE (PF) 50 MCG/ML IJ SOLN (WRAP)
INTRAMUSCULAR | Status: AC
Start: 2015-06-19 — End: ?
  Filled 2015-06-19: qty 2

## 2015-06-19 MED ORDER — OXYCODONE HCL ER 10 MG PO T12A
10.0000 mg | EXTENDED_RELEASE_TABLET | Freq: Two times a day (BID) | ORAL | Status: DC
Start: 2015-06-19 — End: 2018-02-05

## 2015-06-19 MED ORDER — LACTATED RINGERS IV SOLN
INTRAVENOUS | Status: DC | PRN
Start: 2015-06-19 — End: 2015-06-19

## 2015-06-19 MED ORDER — SODIUM CHLORIDE 0.9 % IJ SOLN
INTRAMUSCULAR | Status: AC
Start: 2015-06-19 — End: ?
  Filled 2015-06-19: qty 10

## 2015-06-19 SURGICAL SUPPLY — 89 items
BLADE SAW 95X19.5X1.27MM HALL OSCILLATING STERILE (Blade) ×1 IMPLANT
BLADE SW HALL 95X19.5X1.27MM STRL OSC (Blade) ×2
BNDG ACE ELASTIC 6IN STRL (Procedure Accessories) ×2 IMPLANT
CEMENT BN SMPX P STRL RADOPQ FD (Cement) ×1 IMPLANT
CEMENT BONE RADIOPAQUE FULL DOSE SIMPLEX (Cement) ×1 IMPLANT
CEMENT BONE RADIOPAQUE FULL DOSE SIMPLEX P (Cement) ×1 IMPLANT
CEMENT MIXER W/FEMERAL NOZZLE (Procedure Accessories) ×2 IMPLANT
COMPONENT FEM 3 SGM STRL CRCTE RTN KN LT (Femoral) ×1 IMPLANT
COMPONENT FEMORAL 3 KNEE LEFT CRUCIATE RETAIN SIGMA (Femoral) ×1 IMPLANT
COMPONENT FEMORAL 3 KNEE LT CRUCIATE (Femoral) ×1 IMPLANT
CONTAINER SPEC PP 3OZ CLICK N CLS LF (Procedure Accessories) ×1
CONTAINER SPECIMEN 3 OZ LEAK RESISTANT (Procedure Accessories) ×1
CONTAINER SPECIMEN 3OZ LK RSSTNT SCRW LID CUP MEDLN POLYPROPYLENE (Procedure Accessories) ×1 IMPLANT
COVER A-V IMPULSE IMPAD RGD MD (Sleeve) ×2 IMPLANT
DEVICE CLSR MONODERM 2-0 3/8 CRC QUILL (Suture)
DEVICE MONODERM QUILL 2-0 3/8 CIRCLE 60CM REVERSE CUT UNDYED 19MM (Suture) IMPLANT
DOME PATELLAR OD32 MM CEMENT 3 PEG XS (Patella) ×1 IMPLANT
DOME PATELLAR OD32 MM CEMENT 3 PEG XS OVAL PFC SIGMA KNEE UHMWPE (Patella) ×1 IMPLANT
DOME PTLR UHMWPE XS OVL PFC SGM 32MM (Patella) ×1 IMPLANT
DRAPE 3/4 SHEET FANFLD 52X76IN (Drape) ×8 IMPLANT
DRESSING ABDOMINAL ONE-SZ (Dressing) ×2 IMPLANT
ELECTRODE ELECTROSURGICAL BLADE L2.5 IN (Blade) ×1
ELECTRODE ELECTROSURGICAL BLADE L2.5 IN OD3/32 IN EDGE L1.1 IN (Blade) ×1 IMPLANT
ELECTRODE ESURG BLDE EDG 3/32IN 2.5IN LF (Blade) ×1
GLOVE SRG PLISPRN 8 BGL PI INDCTR (Glove) ×1
GLOVE SURG LTX SZ 8 STRL (Glove) ×6 IMPLANT
GLOVE SURG LTX SZ 8.5 STRL (Glove) ×2 IMPLANT
GLOVE SURGICAL 8 BIOGEL PI INDICATOR (Glove) ×1
GLOVE SURGICAL 8 BIOGEL PI INDICATOR UNDERGLOVE POWDER FREE SMOOTH (Glove) ×1 IMPLANT
GOWN SRG PRFRM FBRC LG STDLN AERO BLU LF (Gown)
GOWN SURGICAL LARGE STANDARD LENGTH (Gown)
GOWN SURGICAL LARGE STANDARD LENGTH PERFORMANCE FABRIC AERO BLUE GREEN (Gown) IMPLANT
HANDPIECE INTRPLSE W/COAX BONE (Ortho Supply) ×2 IMPLANT
IMMOBILIZER KNEE UNIVERSAL L22 IN CANVAS (Immobilizer) ×1
IMMOBILIZER KNEE UNIVERSAL L22 IN CANVAS ORTHOPEDIC DELUXE OD12-24 IN (Immobilizer) ×1 IMPLANT
IMMOBILIZER ORTH CNVS UNV DLX 12-24IN 22 (Immobilizer) ×1
INSERT TIB XLK 2 CRV PFC SGM 10MM STRL (Insert) ×1 IMPLANT
INSERT TIBIAL 2 CURVE PFC SIGMA H10 MM (Insert) ×1 IMPLANT
INSERT TIBIAL 2 CURVE PFC SIGMA H10 MM KNEE FIX BEARING XLK (Insert) ×1 IMPLANT
INSTRUMENT SUCTION NON VENTED LARGE EYED SHEATH POOLE 0035040 (Tubes) ×1 IMPLANT
MARKER SKIN (Positioning Supplies) ×2 IMPLANT
NEEDLE 18GX1.5 (Needles) ×2 IMPLANT
NEEDLE SPINAL DISP 18GX3.5IN (Needles) ×2 IMPLANT
NEEDLE SUT SS CATGUT 1 .5 CRC REG (Needles)
NEEDLE SUTURE 1 1/2 CIRCLE REGULAR (Needles)
NEEDLE SUTURE 1 1/2 CIRCLE REGULAR L1.969 IN TAPER POINT FREE EYE (Needles) IMPLANT
NOZZLE BN CMNT REV STRL TIB PRS TIP CANC (Other) ×1
NOZZLE BONE CEMENT TIBIAL PRESSURIZER (Other) ×1
NOZZLE BONE CEMENT TIBIAL PRESSURIZER TIP REVOLUTION CANCELLOUS (Other) ×1 IMPLANT
PAD ELECTROSRG GRND REM W CRD (Procedure Accessories) ×2 IMPLANT
PAD KNEE SHOULDER COLD SELF SEAL THERAPEUTIC COOLTEMP L11 IN X W10 IN (Other) ×1 IMPLANT
PAD THRP UNV CLTMP 11X10IN LTX CLD SLFSL (Other) ×2
SOL IRR 0.9% NACL 500ML PLS PR BTL ISTNC (Irrigation Solutions) ×2
SOLUTION IRR 0.9% NACL 3L ARTHMTC LF (Irrigation Solutions) ×1
SOLUTION IRRIGATION 0.9% SDM CHLORIDE 500ML PR BTTL ISOTONIC NONPRGNC (Irrigation Solutions) ×2 IMPLANT
SOLUTION IRRIGATION 0.9% SODIUM CHLORIDE (Irrigation Solutions) ×3 IMPLANT
SOLUTION PREP 10% PVP IODINE 3OZ BOTTLE SCRUB (Prep) IMPLANT
SOLUTION PRP 10% PVP IOD 3OZ BTL SCRB (Prep)
SOLUTION SRGPRP 74% ISPRP 0.7% IOD (Prep) ×2
SOLUTION SURGICAL PREP 26 ML DURAPREP (Prep) ×2
SOLUTION SURGICAL PREP 26 ML DURAPREP 74% ISOPROPYL ALCOHOL 0.7% (Prep) ×2 IMPLANT
STRIP SKIN CLOSURE L4 IN X W1/4 IN (Dressing) ×1
STRIP SKIN CLOSURE L4 IN X W1/4 IN REINFORCE STERI-STRIP POLYESTER (Dressing) ×1 IMPLANT
STRIP STRSTRP SKNCLS 4X.25IN PLSTR REINF (Dressing) ×1
SUTURE ABS 1 CP COAT VCL 27IN BRD UD (Suture) ×1
SUTURE COATED VICRYL 1 CP L27 IN BRAID (Suture) ×1
SUTURE COATED VICRYL 1 CP L27 IN BRAID UNDYED ABSORBABLE (Suture) ×1 IMPLANT
SUTURE MONOCRYL 2-0 CT1 36IN (Suture) ×4 IMPLANT
SUTURE MONOCRYL 4-0 PS2 27IN (Suture) ×2 IMPLANT
SUTURE QUILL PPO2 45X45 (Suture) ×2 IMPLANT
SYRINGE 20 ML BD LUER-LOK MEDICAL (Syringes, Needles) ×1 IMPLANT
SYRINGE 50 ML GRADUATE NONPYROGENIC DEHP (Syringes, Needles) ×1
SYRINGE 50 ML GRADUATE NONPYROGENIC DEHP FREE PVC FREE LOK MEDICAL (Syringes, Needles) ×1 IMPLANT
SYRINGE MED 20ML LL LF STRL (Syringes, Needles) ×2
SYRINGE MED 50ML LL LF STRL GRAD N-PYRG (Syringes, Needles) ×1
SYRINGE TB 1ML 27G X .5" (Syringes, Needles) ×2 IMPLANT
TOURNIQUET 34IN STRL (Procedure Accessories) ×2 IMPLANT
TRAY FOLEY CATHETER 16F LTX (Catheter Micellaneous) ×1
TRAY FOLEY CATHETER 16F LTX (Catheter Miscellaneous) ×1 IMPLANT
TRAY SKIN BETANDINE PREP (Tray) ×2 IMPLANT
TRAY TIB COCR 2 PFC SGM STRL CMNT MDLR (Component) ×1 IMPLANT
TRAY TIBIAL 2 KNEE PFC SIGMA CEMENT (Component) ×1 IMPLANT
TRAY TIBIAL 2 KNEE PFC SIGMA CEMENT MODULAR FIX BEARING COCR (Component) ×1 IMPLANT
TRAY TOTAL KNEE FFX (Pack) ×2 IMPLANT
TUBING POOLE SUCTION DEVICE (Tubes) ×2
WATER STERILE PLASTIC POUR BOTTLE 1000 (Irrigation Solutions) ×1
WATER STERILE PLASTIC POUR BOTTLE 1000 ML (Irrigation Solutions) ×1 IMPLANT
WATER STRL 1000ML PLS PR BTL LF (Irrigation Solutions) ×1
WRAP COBAN SELF ADHRNT 6INX5YD (Procedure Accessories) ×4 IMPLANT

## 2015-06-19 NOTE — Progress Notes (Signed)
Writer spoke with pt re: discharge plan. Pt is from home. POD 0 L TKA.   Pt reported she lives with her husband in a 3 level house. Pt stays on the 2nd level.   Spouse is pt's support system at home. Spouse is present at the bedside.  Pt prefers [facility] if needed.  Informed pt that PT recommends outpatient PT. Pt reported she scheduled her outpatient PT on June 23, 2015.  Pt's spouse reported he is able to change  PT will re-evaluate again later this evening.  Confirmed facesheet information with the pt.  Pt will go home once medically cleared and spouse to transport.  Discharge plan is likely home with no needs. DCP will continue to f/u prn.     06/19/15 1427   Patient Type   Within 30 Days of Previous Admission? No   Medicare focused diagnosis patient? Hip/Knee   Bundle patient? Joint   Healthcare Decisions   Interviewed: Patient;Spouse   Interviewee Contact Information: Aireanna Luellen, spouse, [phone]   Orientation/Decision Making Abilities of Patient Alert and Oriented x3, able to make decisions   Advance Directive Patient has advance directive, copy in chart   Advance Directive not in Chart Copy requested from family/decision maker   Healthcare Agent Appointed Yes   Healthcare Agent's Name Cherlynn Polo   Healthcare Agent's Phone Number [phone]   Prior to admission   Prior level of function Independent with ADLs;Ambulates with assistive device   Type of Residence Private residence   Home Layout Multi-level   Have running water, electricity, heat, etc? Yes   Living Arrangements Spouse/significant other   DME Currently at Home Single point cane;Stair Glide  (raised toilet seat, shower chair, rollator and FWW)   Home Care/Community Services None  (pt prefers [facility])   Adult Protective Services (APS) involved? No   Discharge Planning   Support Systems Spouse/significant other   Patient expects to be discharged to: home   Anticipated Minor plan discussed with: Same as interviewed    Potential barriers to discharge: (none)   Mode of transportation: Private car (family member)   Consults/Providers   PT Evaluation Needed 1   OT Evalulation Needed 2   SLP Evaluation Needed 2   Correct PCP listed in Epic? Yes   Important Message from [facility] Notice   Patient received 1st IMM Letter? Yes   Date of most recent IMM given: 06/19/15   Janie Morning  Case Management  [phone]

## 2015-06-19 NOTE — PT Progress Note (Signed)
Physical Therapy Note  [facility]  Physical Therapy Treatment    Patient:  Darlene Landry MRN#:  [id number]  Unit:  [address] SURG Room/Bed:  A2608/A2608-01    Time of treatment: Time Calculation  PT Received On: 06/19/15  Start Time: 1805  Stop Time: 1907  Time Calculation (min): 62 min           Visit # 2     Precautions:   Precautions  Weight Bearing Status: RLE total weight bearing, LLE total weight bearing  Total Knee Replacement: knee immobilizer, OOB (removed 2/2 good L quad control. )  Precaution Instructions Given to Patient: Yes  Other Precautions: Fall    Updated X-Rays/Tests/Labs:  Lab Results   Component Value Date/Time   None available.     Subjective: "Oh good, I'm glad you're here. I'd really like to walk to the bathroom."    Patient Goal: To be able to walk without the cane    Pain Assessment  Pain Assessment: Numeric Scale (0-10)  Pain Score: 6-moderate pain  POSS Score: Awake and Alert  Pain Location: Knee  Pain Orientation: Left  Pain Frequency: Constant/continuous;Increases with movement  Effect of Pain on Daily Activities: moderate  Pain Intervention(s): Medication (See eMAR);Cold applied;Repositioned;Ambulation/increased activity    Patient's medical condition is appropriate for Physical Therapy intervention at this time.  Patient is agreeable to participation in the therapy session. Nursing clears patient for therapy.    Objective:  Observation of Patient/Vital Signs:   Filed Vitals:    06/19/15 1749   BP: 124/60   Pulse: 60   Temp:    Resp:    SpO2:      VS assessed and stable t/o session.     Patient received in bed with intravenous (IV) line, foot pumps, bed alarm  and cryocuff in place.    Cognition/Neuro Status  Arousal/Alertness: Appropriate responses to stimuli  Attention Span: Appears intact  Orientation Level: Oriented X4  Memory: Appears intact  Following Commands: Follows all commands and directions without difficulty  Safety Awareness: minimal verbal  instruction  Insights: Decreased awareness of deficits;Educated in safety awareness  Problem Solving: Able to problem solve independently  Behavior: attentive;calm;cooperative  Motor Planning: intact  Coordination: intact    Musculoskeletal Examination  Gross ROM  Right Lower Extremity ROM: within functional limits  Left Lower Extremity ROM: within functional limits (knee approx -10 to 85 deg flexion AROM. )  Gross Strength  Right Lower Extremity Strength: 4+/5  Left Lower Extremity Strength: 3-/5 (ankle DF 4+/5)            Functional Mobility  Supine to Sit: Contact Guard Assist;Increased Time;Increased Effort;HOB raised (for LLE, instructions for technique)  Scooting to HOB: Stand by Assist;Increased Time;Increased Effort (instructions for technique)  Scooting to EOB: Stand by Assist;Increased Time;Increased Effort (instructions for technique)  Sit to Supine: Minimal Assist (for LLE elevation, instructions for technique)  Sit to Stand: Minimal Assist;Increased Time;Increased Effort;bed elevated;with instruction for hand placement to increase safety (for hip/trunk extension)  Stand to Sit: Minimal Assist (for eccentric control, instructions for technique)   Pt donned baseline finger extension splints with assist from husband at EOB prior to amb.      Locomotion  Ambulation: Minimal Assist;with front-wheeled walker (for balance and support, instruction for technique)  Ambulation Distance (Feet): 8 Feet  Pattern: L decreased stance time;R flexed knee;L flexed knee;R foot decreased clearance;L foot decreased clearance;L foot flat;decreased cadence;decreased step length;Step to  Pt amb as above to bathroom, completed toilet transfer with min A and bedside commode positioned over toilet, instruction for technique. Pt voided and completed pericare with supervision and assistance from husband. Pt amb additional 8' back to bed as above, forward flexed posture despite instruction, further ambulation unsafe 2/2 fatigue.      Therapeutic Exercise: in supine  Straight Leg Raise: x 10 LLE  Quad Sets: x 10 LLE  Heelslides: x 10 LLE  Glute Sets: x 10 LLE  Knee AROM Short Arc Quad: x 10 LLE  Ankle Pumps: x 20 B    Reviewed HEP as above, with written handout provided, instruction and facilitation provided t/o as needed for appropriate technique. Pt/spouse demonstrated good technique with above HEP following instruction and practice. Reviewed frequency recommendation, signs of intolerance of HEP, and reviewed recommendation for completion of each HEP exercise (single set) prior to next PT session in order to assess carryover and have therapist review any pt questions or concerns regarding HEP. Pt/spouse verbalized understanding and agreement.        Neuro Re-Ed  Sitting Balance: sitting reaching activities;with instruction;without support;supervision  Standing Balance: with instruction;with support;minimal assist       Educated the patient to role of physical therapy, plan of care, goals of therapy and HEP, safety with mobility and ADLs, weight bearing precautions, discharge instructions, home safety.  Patient left in bed with foot pumps, bed alarm and cryocuff in place and call bell and all personal items/needs within reach. RN notified of session outcome.       Assessment: Pt demonstrated mild functional mobility progress, with improved ambulation tolerance as compared to initial evaluation, however pt with increased pain and fatigue as compared to initial evaluation. Pt continued to require physical assistance for transfers and ambulation, with limited ambulation distance as above. Continue recommendation for home with supervision and outpatient PT, however if pt does not demonstrate adequate functional mobility progress may require HH PT as a bridge to maximize pt safety with mobility prior to transitioning to outpatient PT.     Patient has not met clinical pathway goals for gait and transfers; and discharge instructions provided with  coach present.     Therapist discussed recommendation(s) of supervision, home health and outpatient physical therapy  with patient and family member. Patient was agreeable. Discussed current rec for outpatient, however if pt does not demonstrate appropriate progress d/c recommendation may be updated to include [facility] PT prior to transition to outpatient. Pt/spouse agreeable.     Patient is not cleared for D/C from PT standpoint. RN notified.      Plan: Treatment/Interventions: Exercise, Gait training, Stair training, Functional transfer training, Neuromuscular re-education, LE strengthening/ROM, Endurance training, Patient/family training, Equipment eval/education, Bed mobility      PT Frequency: twice a day   Continue plan of care.    Goals: Goals  Goal Formulation: With patient/family  Time for Goal Acheivement: By time of discharge  Goals: Select goal  Pt Will Go Supine To Sit: independent, to maximize functional mobility and independence, Partly met  Pt Will Perform Sit to Stand: independent, to maximize functional mobility and independence, Partly met  Pt Will Transfer Bed/Chair: with rolling walker, modified independent, to maximize functional mobility and independence (NT)  Pt Will Ambulate: 151-200 feet, with rolling walker, with supervision, to maximize functional mobility and independence, Partly met  Pt Will Go Up / Down Stairs: 3-5 stairs, With rail, with stand by assist, With [facility], to maximize functional mobility and independence (NT)  Pt Will Perform Home Exer Program: independent, to maximize functional mobility and independence, Partly met  Other Goal: Pt will increase L knee AROM to at least -5 to 95 deg flexion AROM. (Partly Met)  Other Goal #2: Pt will ascend/descend 4 platform steps with RW and SBA. (Partly Met. )        DME Recommended for Discharge: Other (Comment) (appears to have in place)  Discharge Recommendation: Home with supervision, Home with outpatient PT, Other (Comment) (may require HHPT  bridge if doesnt demo appropriate progress)     Concha Pyo PT, DPT x [phone]  06/19/2015 7:32 PM

## 2015-06-19 NOTE — Transfer of Care (Signed)
Anesthesia Transfer of Care Note    Patient: Darlene Landry    Procedures performed: Procedure(s):  LEFT TOTAL KNEE REPLACEMENT    Anesthesia type: Spinal    Patient location:Phase I PACU    Last vitals:   Filed Vitals:    06/19/15 1110   BP: 129/58   Pulse: 65   Temp: 36.2 C (97.2 F)   Resp: 14   SpO2: 98%       Post pain: Patient not complaining of pain, continue current therapy      Mental Status:awake and alert     Respiratory Function: tolerating room air    Cardiovascular: stable    Nausea/Vomiting: patient not complaining of nausea or vomiting    Hydration Status: adequate    Post assessment: no apparent anesthetic complications, no reportable events and no evidence of recall     Report given to RN

## 2015-06-19 NOTE — Interval H&P Note (Signed)
H&P reviewed and patient examined.  No change in status noted.

## 2015-06-19 NOTE — PT Eval Note (Signed)
[facility]  Physical Therapy Evaluation and Treatment    Patient: Darlene Landry     MRN#: [id number]   Unit: [address] [facility]  Bed: A2608/A2608-01    Time of Evaluation and Treatment:  Time Calculation  PT Received On: 06/19/15  Start Time: 1235  Stop Time: 1328  Time Calculation (min): 53 min    Evaluation Time: 10 minutes  Treatment Time: 43 minutes     Consult received for Darlene Landry for PT Evaluation and Treatment.  Patient's medical condition is appropriate for Physical therapy intervention at this time.    Activity Orders: Activity as tolerated, ambulate pt.     Precautions and Contraindications:   Precautions  Weight Bearing Status: RLE total weight bearing, LLE total weight bearing  Total Knee Replacement: knee immobilizer, OOB (removed 2/2 good L quad control. )  Precaution Instructions Given to Patient: Yes  Other Precautions: Fall    Medical Diagnosis: Primary localized osteoarthritis of left knee [M17.12]    History of Present Illness: Darlene Landry is a 74 y.o. female admitted on 06/19/2015 for elective L TKA with Dr. Reynolds Bowl.     Patient Active Problem List   Diagnosis   . Spinal stenosis in cervical region   . Osteoarthritis of left knee   . Osteoarthritis of left knee        Past Medical/Surgical History:  Past Medical History   Diagnosis Date   . Hypertensive disorder    . Hypothyroidism    . Hyperlipidemia    . Spinal cord injury without spinal bone injury    . Cervical stenosis of spinal canal    . Heart murmur      "occasionally"   . Headache    . Neuropathy      left foot more than right, both hands, right arm (post spinal cord injury)   . Arthritis    . Eczema      legs   . Fracture      cervical, fx left arm age 26, fx facial bone   . Bilateral cataracts      had CE bilat   . Anxiety      well controlled      Past Surgical History   Procedure Laterality Date   . Hysterectomy  1995   . Joint replacement  09/19/2010     right knee   . Spine surgery       NECK FUSION   . Breast  surgery       MILK DUCT   . Eye surgery  2016     CE bilat         X-Rays/Tests/Labs:  Lab Results   Component Value Date/Time     No imaging or lab values available for current admission.     Social History:  Prior Level of Function  Prior level of function: Independent with ADLs, Ambulates with assistive device  Assistive Device: Single point cane  Baseline Activity Level: Community ambulation  DME Currently at Home: Single point cane, Stair Glide (raised toilet seat, shower chair, rollator and FWW)    Home Living Arrangements  Living Arrangements: Spouse/significant other  Type of Home: House  Home Layout: Multi-level  Bathroom Shower/Tub: Cabin crew: Tub transfer bench  DME Currently at Home: Single point cane, Stair Glide (raised toilet seat, shower chair, rollator and FWW)  Home Living - Notes / Comments: Pt's spouse available 24/7 following D/C as needed.    Subjective:  Patient is agreeable to participation in the therapy session. Nursing clears patient for therapy.   Patient Goal: To be able to walk without the cane  Pain Assessment  Pain Assessment: No/denies pain    Objective:  Observation of Patient/Vital Signs:  Filed Vitals:    06/19/15 1504   BP: 137/65   Pulse: 61   Temp: 95 F (35 C)   Resp: 16   SpO2: 100%     VS assessed and stable t/o session.     Patient received in bed with intravenous (IV) line, foot pumps, cryocuff and knee immobilizer in place.      Inspection/Posture: Pt with sensation to light touch intact BLE. Pt reports baseline sensory impairments on R-side, including increased sensitivity to light touch. Pt also presents with R digits flexed into fist, able to actively extend digits 1-3.      Cognitive Status and Neuro Exam:  Cognition/Neuro Status  Arousal/Alertness: Appropriate responses to stimuli  Attention Span: Appears intact  Orientation Level: Oriented X4  Memory: Appears intact  Following Commands: Follows all commands and directions without  difficulty  Safety Awareness: minimal verbal instruction  Insights: Fully aware of deficits;Educated in safety awareness  Problem Solving: Able to problem solve independently  Behavior: attentive;calm;cooperative  Motor Planning: intact  Coordination: intact      Musculoskeletal Examination  Gross ROM  Right Lower Extremity ROM: within functional limits  Left Lower Extremity ROM: within functional limits (knee approx -10 to 85 deg flexion AROM. )  Gross Strength  Right Lower Extremity Strength: 4+/5  Left Lower Extremity Strength: 3-/5 (ankle DF 4+/5)       Functional Mobility:  Functional Mobility  Supine to Sit: Stand by Assist;Increased Time;Increased Effort;HOB raised (instructions for technique)  Scooting to HOB: Stand by Assist;Increased Time;Increased Effort (instructions for technique)  Scooting to EOB: Stand by Assist;Increased Time;Increased Effort (instructions for technique)  Sit to Supine: Stand by Assist (instructions for technique)  Sit to Stand: Minimal Assist;Increased Time;Increased Effort;bed elevated;with instruction for hand placement to increase safety (for hip/trunk extension)  Stand to Sit: Contact Guard Assist (instructions for technique)     Locomotion  Ambulation: Minimal Assist;with front-wheeled walker (for balance, instruction for technique)  Ambulation Distance (Feet): 1 Feet  Pattern: L decreased stance time;R foot decreased clearance;L foot decreased clearance;decreased cadence;decreased step length;Step to     Balance  Balance  Balance: needs focused assessment  Sitting - Static: Good  Sitting - Dynamic: Good  Standing - Static: Good (with RW)  Standing - Dynamic: Good (Good minus with RW)    Participation and Activity Tolerance  Participation and Endurance  Participation Effort: good  Endurance: Tolerates < 10 min exercise, no significant change in vital signs  [place] Dyspnea Scale: 0 Dyspnea    Educated the patient to role of physical therapy, plan of care, goals of  therapy and HEP, safety with mobility and ADLs, weight bearing precautions, discharge instructions, home safety.   Patient left in bed with foot pumps, bed alarm and cryocuff in place and call bell and all personal items/needs within reach. RN notified of session outcome.     Assessment: Darlene Landry is a 75 y.o. female admitted 06/19/2015 for L TKA.    Pt presents with the following impairments: Assessment: Decreased LE strength;Decreased LE ROM;Decreased endurance/activity tolerance;Decreased functional mobility;Decreased balance;Gait impairment.    Pt would benefit from Physical Therapy to improve ROM, strength, balance, gait impairment, and activity tolerance. Pt is progressing well s/p TKA,,  with good home support system and extensive home equipment related to hx of SCI. Recommend home with supervision for safety and outpatient PT to further maximize pt safety and independence with functional mobility.     Patient has not met clinical pathway goals for gait and transfers; and discharge instructions provided with coach present.     Therapist discussed recommendation(s) of supervision and outpatient physical therapy  with patient and family member. Patient was agreeable.    Patient is not cleared for D/C from PT standpoint. RN notified.    Post Total Knee Replacement Screen for Occupational Therapy    1. Did the patient have difficulties with ADLs prior to admission? Yes, however pt's spouse assists pt with ADLs at baseline 2/2 RUE deficits from SCI.  2. Would the patient benefit from OT consult to further increase independence with ADLs?  No    3. Does the patient need assessment and education for a tub transfer bench or shower chair? No      Occupational Therapy Evaluation is not indicated at this time.    RN notified. Patient is agreeable.      Treatment: Reviewed and demonstrated full weight-bearing precaution LLE. Reviewed appropriate sit to stand transfer and ambulation technique with RW. Pt completed L  SLR x 5 with knee immobilizer donned with good control. Knee immobilizer removed, pt completed L SLR x 5 with good control, minimal extensor lag, knee immobilizer left off for mobility 2/2 good quad control. Pt sat EOB x approx 8 min t/o session with supervision, engaged in limited dynamic reaching activities with no LOB. Pt with c/o lightheadedness t/o session, VS assessed and stable. Pt reported complaints may be related to administration of Lyrica, which she reports having a poor tolerance of when taken in the past. Pt reported she had started taking Lyrica on the night prior to her fall with subsequent SCI, and believes they may have been related. She verbalized concern regarding fall risk and Lyrica use, PT relayed concerns to charge RN immediately following session. Pt completed sit to stand as above, stood with RW and min A x 1 min, completed lateral weight shifting and steps in place x 2 B with instruction for improved upright posture. Pt amb as above, laterally along bedside, with instruction for improved upright posture and RW management. Ambulation limited by fatigue. Pt returned to supine and completed bed mobility as above with instruction for appropriate positioning. Reviewed PT plan of care including frequency and focus of sessions. Reviewed D/C recommendations as below. Reviewed PT clearance procedures. Reinforced importance of OOB activity and ambulation for improved activity tolerance and functional outcomes. Reinforced need for nursing assistance with all OOB activity for safety. Reinforced use of call bell. Instruction provided for importance of keeping foot of bed flat and avoiding use of pillows under knee to promote knee extension. Pt/spouse verbalized understanding and agreement with plan. Addressed all pt/spouse questions and concerns.         Plan: Treatment/Interventions: Exercise, Gait training, Stair training, Functional transfer training, Neuromuscular re-education, LE  strengthening/ROM, Endurance training, Patient/family training, Equipment eval/education, Bed mobility   PT Frequency: twice a day   Risks/Benefits/POC Discussed with Pt/Family: With patient/family     G codes: yes  Mobility, Current Status (Z6109): CK  Mobility, Goal Status (U0454): CJ       Goals:   Goals  Goal Formulation: With patient/family  Time for Goal Acheivement: By time of discharge  Goals: Select goal  Pt Will Go Supine To Sit:  independent, to maximize functional mobility and independence  Pt Will Perform Sit to Stand: independent, to maximize functional mobility and independence  Pt Will Transfer Bed/Chair: with rolling walker, modified independent, to maximize functional mobility and independence  Pt Will Ambulate: 151-200 feet, with rolling walker, with supervision, to maximize functional mobility and independence  Pt Will Go Up / Down Stairs: 3-5 stairs, With rail, with stand by assist, With SPC, to maximize functional mobility and independence  Pt Will Perform Home Exer Program: independent, to maximize functional mobility and independence  Other Goal: Pt will increase L knee AROM to at least -5 to 95 deg flexion AROM.  Other Goal #2: Pt will ascend/descend 4 platform steps with RW and SBA.        DME Recommended for Discharge: Other (Comment) (Pt appears to have all equipment in place.)  Discharge Recommendation: Home with supervision, Home with outpatient PT      Concha Pyo PT, DPT x [phone]  06/19/2015 4:07 PM

## 2015-06-19 NOTE — Plan of Care (Signed)
Problem: Physical Therapy  Goal: Patient condition is improving per Physical Therapy Treatment Plan  Outcome: Progressing  Discharge Recommendation: Home with supervision, Home with outpatient PT, Other (Comment) (may require HHPT bridge if doesnt demo appropriate progress)  DME Recommended for Discharge: Other (Comment) (appears to have in place)    Is an Occupational Therapy Evaluation Indicated at this time? No, the patient does not require a OT evaluation.    Treatment/Interventions: Exercise, Gait training, Stair training, Functional transfer training, Neuromuscular re-education, LE strengthening/ROM, Endurance training, Patient/family training, Equipment eval/education, Bed mobility  PT Frequency: twice a day     Early/Progressive Mobility Protocol Level: Step 6: Ambulate in Room (with assistance)  Updated on Patient's Flip Chart in Room  (Please See Therapy Evaluation for device and assistance level needed)    Goals:   Goals  Goal Formulation: With patient/family  Time for Goal Acheivement: By time of discharge  Goals: Select goal  Pt Will Go Supine To Sit: independent, to maximize functional mobility and independence, Partly met  Pt Will Perform Sit to Stand: independent, to maximize functional mobility and independence, Partly met  Pt Will Transfer Bed/Chair: with rolling walker, modified independent, to maximize functional mobility and independence (NT)  Pt Will Ambulate: 151-200 feet, with rolling walker, with supervision, to maximize functional mobility and independence, Partly met  Pt Will Go Up / Down Stairs: 3-5 stairs, With rail, with stand by assist, With SPC, to maximize functional mobility and independence (NT)  Pt Will Perform Home Exer Program: independent, to maximize functional mobility and independence, Partly met  Other Goal: Pt will increase L knee AROM to at least -5 to 95 deg flexion AROM. (Partly Met)  Other Goal #2: Pt will ascend/descend 4 platform steps with RW and SBA. (Partly Met.  )

## 2015-06-19 NOTE — Plan of Care (Addendum)
Problem: Moderate/High Fall Risk Score >5  Goal: Patient will remain free of falls  Outcome: Progressing  Assessed pt's fall risk and placed fall risk on white board. Educated pt on use of call bell/telephone and white board, pt verbalized understanding. Maintained safe environment during hourly rounding; placed bedside table, water, personal items, and call bell close to pt. Walker available at bedside. Included family in safety planning, family verbalized understanding. Assessed pt's risk for elopement, pt not at risk at this time.          Problem: Pain  Goal: Patient's pain/discomfort is manageable  Outcome: Progressing  Pt stated comfort function goal 5/10 maintained. Oxy IR prn. Pt and family educated re. Pain management, included in plan of care and the patient agrees with the plan of care. Pt has h/o spinal cord injury has neuropathy and sensitivity on R side as baseline.        Problem: Day of Surgery- Knee Surgery  Goal: Stable Neurovascular Status  Outcome: Progressing  Pt BLE appropriate in color for ethnicity and warm to touch, cap refill <3sec. Pedal  pulses 2+ bil. Dorsi- and plantarflexion moderate and symmetrical bil, pt able to wiggle her toes when asked to do so.    VTE: Footpumps, Aspirin.  Goal: Free from Infection  Outcome: Progressing  Pt remains afebrile, VSS, dressing is CDI no drainage noted. Pt uses IS appropriately, IV ABx scheduled. Will ct to monitor.      Comments:   Pt has h/o HTN, VSS, BP meds scheduled in am, pt denies any CP/SOB, will ct to monitor.

## 2015-06-19 NOTE — Plan of Care (Signed)
Problem: Safety  Goal: Patient will be free from injury during hospitalization  Outcome: Progressing  Pt AOX3. Pt and pt spouse oriented to unit, room, call bell, discussed fall prevention safety plan and hourly rounding. Both verbalized understanding, all questions answered. Call bell, phone, and personal belongings at bedside, bed in lowest position. Hourly rounding completed. Pt demonstrates proper use of call bell for assistance PRN.     Problem: Pain  Goal: Patient's pain/discomfort is manageable  Outcome: Progressing  Pt pain well controlled. Reported pain in "upper moderate" region of scale after therapy, OXY IR given as ordered. Will monitor and reassess as appropriate. Pt also with hx of spinal cord injury and subsequent neuropathy and sensitivity, particularly to the R side, but some mild tingling and discomfort to L side as well.     Problem: Day of Surgery- Knee Surgery  Goal: Hemodynamic Stability  Outcome: Progressing  VSS. Pt slightly bradycardic at times, baseline since PACU, will continue to monitor. Afebrile. Due to void. Gauze with ace wrap drsg CDI.   Goal: Stable Neurovascular Status  Outcome: Progressing  Neurovascular checks WNL. +plantar and dorsiflexion to BLE, +2 pedal pulses, brisk cap refill.  Goal: Free from Infection  Outcome: Progressing  Afebrile. Pt familiar with IS, encouraged to use 10x/hr while awake and discussed rationale, pt verbalized understanding. Again, pt still due to void.    Comments:   LBM last night, denies flatus. Denies N/V. Good appetite. Will continue to round hourly and assist PRN.

## 2015-06-19 NOTE — Progress Notes (Signed)
Pt arrived to floor in stable condition.

## 2015-06-19 NOTE — Plan of Care (Signed)
Problem: Physical Therapy  Goal: Patient condition is improving per Physical Therapy Treatment Plan  Outcome: Progressing  Discharge Recommendation: Home with supervision, Home with outpatient PT  DME Recommended for Discharge: Other (Comment) (Pt appears to have all equipment in place.)    Is an Occupational Therapy Evaluation Indicated at this time? No, the patient does not require a OT evaluation.    Treatment/Interventions: Exercise, Gait training, Stair training, Functional transfer training, Neuromuscular re-education, LE strengthening/ROM, Endurance training, Patient/family training, Equipment eval/education, Bed mobility  PT Frequency: twice a day     Early/Progressive Mobility Protocol Level: Step 6: Ambulate in Room (with assistance)  Updated on Patient's Flip Chart in Room  (Please See Therapy Evaluation for device and assistance level needed)    Goals:   Goals  Goal Formulation: With patient/family  Time for Goal Acheivement: By time of discharge  Goals: Select goal  Pt Will Go Supine To Sit: independent, to maximize functional mobility and independence  Pt Will Perform Sit to Stand: independent, to maximize functional mobility and independence  Pt Will Transfer Bed/Chair: with rolling walker, modified independent, to maximize functional mobility and independence  Pt Will Ambulate: 151-200 feet, with rolling walker, with supervision, to maximize functional mobility and independence  Pt Will Go Up / Down Stairs: 3-5 stairs, With rail, with stand by assist, With SPC, to maximize functional mobility and independence  Pt Will Perform Home Exer Program: independent, to maximize functional mobility and independence  Other Goal: Pt will increase L knee AROM to at least -5 to 95 deg flexion AROM.  Other Goal #2: Pt will ascend/descend 4 platform steps with RW and SBA.

## 2015-06-19 NOTE — Progress Notes (Signed)
Pt transferred to unit 26 in stable condition. Bedside report given to [facility], Charity fundraiser.

## 2015-06-19 NOTE — UM Notes (Signed)
Insurer:  Medicare  Inpatient admission to medicine (cpoe [id number])  PREOPERATIVE DIAGNOSIS:   degenerative arthritis of left knee    POSTOPERATIVE DIAGNOSIS:   degenerative arthritis of left knee    TITLE OF PROCEDURE:   LEFT TOTAL KNEE REPLACEMENT    COMPONENTS:   DePuy PFC Sigma size 3.0 Left cruciate-retaining, femoral component Porocoat press-fit; 32-mm oval dome patella 3-peg polyethylene, cemented; size 2.0 PFC Sigma modular fixed bearing cobalt chrome tibial tray, cemented; 10-mm curved fixed bearing tibial polyethylene insert.  Plan of care  vs q4h after post operative monitoring protocol completed   scheduled labs   strict I and o   oxygen therapy prn    resume home medications    ivf, abx, antiemetics and pain medications per md orders   pmr and/or wound care per unit protocol         dcp:  Pending    Janie Morning, RN,   Case Manager 2  [facility]  [phone] (CM staff and providers only please)        [phone] (when on site)

## 2015-06-19 NOTE — Progress Notes (Signed)
Dr. Stevphen Rochester informed of BP higher than preop, no new orders.

## 2015-06-19 NOTE — Anesthesia Postprocedure Evaluation (Signed)
Anesthesia Post Evaluation    Patient: Darlene Landry    Procedures performed: Procedure(s):  LEFT TOTAL KNEE REPLACEMENT    Anesthesia type: Spinal    Patient location:Phase I PACU    Last vitals:   Filed Vitals:    06/19/15 1130   BP: 143/65   Pulse: 51   Temp:    Resp: 20   SpO2: 100%       Post pain: Patient not complaining of pain, continue current therapy      Mental Status:awake and alert     Respiratory Function: tolerating room air    Cardiovascular: stable    Nausea/Vomiting: patient not complaining of nausea or vomiting    Hydration Status: adequate    Post assessment: no apparent anesthetic complications, no reportable events and no evidence of recall

## 2015-06-19 NOTE — OR PreOp (Signed)
Husband Darlene Landry in lobby.[facility] Day of Surgery    Pre - Op Holding Additional Note :    Family Member / Psychologist, occupational / Guardian Present in Pre-Op Holding  : Yes

## 2015-06-19 NOTE — Op Note (Signed)
Total Knee Replacement Operative Note    [name]    Procedure Date: 06/19/2015     SURGEON: Deeann Cree, MD     FIRST ASSISTANT:   Leanord Hawking, RNFA.     SECOND ASSISTANT:   Javed Parvin, CSA    PREOPERATIVE DIAGNOSIS:   degenerative arthritis of left knee     POSTOPERATIVE DIAGNOSIS:   degenerative arthritis of left knee    TITLE OF PROCEDURE:   LEFT TOTAL KNEE REPLACEMENT    COMPONENTS:   DePuy PFC Sigma size 3.0 Left cruciate-retaining, femoral component Porocoat press-fit; 32-mm oval dome patella 3-peg polyethylene, cemented; size 2.0 PFC Sigma modular fixed bearing cobalt chrome tibial tray, cemented; 10-mm curved fixed bearing tibial polyethylene insert.    ANESTHESIA:   Spinal  Dr. Anner Crete, MD    PREPARATION:   Betadine Scrub, Alcohol and DuraPrep.     INDICATIONS:   This 74 yo woman was seen for consultation at [facility] regarding complaints of chronic, progressively severe left knee pain and angular deformity noted over the past several years.  Evaluation confirmed severe degenerative disease consistent with osteoarthritis with valgus deformity.  The patient underwent right total knee replacement on 09/19/2010. She fell down the stairs at home soon after her discharge suffering fractures of the cervical spine with neurologic deficit for which she underwent ORIF performed by Rosemarie Beath, MD.  She has decided to proceed with left total knee replacement as she has difficulty with minimal activities of daily living due to the severe arthritis.  Medical and neurologic clearances were obtained as well as informed consent.    DESCRIPTION OF PROCEDURE:   After the satisfactory accomplishment ofspinalanesthesia,the patient was positioned supine and the lower extremity prepped and draped free in the usual sterile manner. An Esmarch wrap was tightly applied from toesto above the knee and the tourniquet at the thigh inflated to 300 mmHg pressure. A straight anterior  longitudinal midline incision was made with dissection carried sharply through the skin and subcutaneous layers between the fibers of vastus medialis obliquus in the mid vastus zone and then carried distally along the medial border of the patella and patellar tendon. The patella was dislocated laterally but not everted. Severe tricompartmental degenerative arthritis was noted. The patient had a 8-degreeflexion contracture preoperatively and maximum flexion reached 106 degrees with 18 degrees of genu valgus alignment. All compartments were involved to a very significant degree. A pilot hole was made in the trochlea region of the femur and an intramedullary alignment rod inserted with a 7-degree valgus Left distal femoral cutting guide. Measured resections were made from the distal femur medially and laterally, adequate in part to relieve the 8 degree flexion contracture. Sizing of the femur was performed, and it was determined that a size 3.0 component was ideal, and thus the same size captured cutting guide was applied to the cut surface of the distal femur to allow for anterior and posterior condylar cuts and chamfer cuts as well. Attention was redirected to the tibia. The ACL was resected and the PCL recessed along the posterior slope of the tibial eminence and protected at all times. The menisci were resected. An extramedullary alignment system was utilized for the proximal tibial resection. The stylus of the guide was placed on the surface of the lateral tibial plateau and set at zero. The proximal resection was performed with a 5-degree posterior slope captured guide. The cut surface of the tibia was covered perfectly with a  size 2.0 tibial tray. A trial reduction was performed with a 10-mm curved tibial composite insert. With a curved one inch osteotome hypertrophic bone was resected from the posterior aspects of the medial and lateral femoral condyles as needed to relieve posterior impingement and enhance  flexion. The flexion and extension gaps perfectly matched and the knee was well aligned. The knee was stable  to varus and valgus stress throughout flexion and extension with ligaments balanced perfectly. Thickness of the patella was measured at 20 mm. The arthritic surface of the patella was resected to a base thickness of 14.5 mm. The cut surface of the patella was then covered perfectly with a 32-mm oval patellar template. The holes for the 3 fixation pegs of the patellar trial were drilled and a patellar trial applied. Patellar tracking was corrected to normal with a lateral retinacular release. The knee came to full extension with a 10-mm composite insert and with the gravity flex test flexed to 115 degrees. The femoral lug holes were drilled using the femoral trial as a template and the tibial metaphysis prepared with reamer and broach for the stem and keel of the tibial component using the tibial tray as a template. Trial components were removed. The knee was very thoroughly lavaged with saline with a pulsatile lavage system and at the same time, methyl methacrylate prepared in the Stryker vacuum cement mixing system.  Sclerotic subchondral bone of the medial and lateral tibial plateau was penetrated multiple times as needed with a fine drill bit to enhance cement Integration. After drying the tibial cut surface thoroughly, and at the proper viscosity, methyl methacrylate was injected onto the cut surface of the tibia and manually compressed into the interstices of the tibial surface. Methyl methacrylate was also applied to the undersurface of the tibial tray, keeping the stem bare. The tibial component was then impacted into place and excess cement expressed and sharply trimmed from its margin. The patellar component was cemented in a similar manner. At this time the tourniquet was released and circulation restored to the lower extremity. The remainder of the procedure was performed without tourniquet  control. Tourniquet time was 49 minutes. The intramedullary canal of the femur was plugged with autogenous bone graft prepared from the distal femoral chamfer cuts. The knee was brought into extension and explored for bleeding. There was no unusual bleeding encountered and thus very minimal use of cautery required. The knee was very thoroughly lavaged with saline in extension and then again in flexion. Finally, the tibial tray was lavaged and dried, and a 10-mm curved fixed bearing tibial poly insert was secured tothe tibial tray in the usual manner. Next, the fully porous-coatedsize3.0 Left cruciate-retaining femoral component was impacted into place with an excellent total contact press fit achieved. Closure was performed in 120 degrees flexion with continuous #2 Quill for the vastus medialis and medial capsular repair, followed by interrupted 2-0 Monocryl for the superficial fascial and subcutaneous layers, and then a subcuticular closure for the skin with continous 4-0 Monocryl and overlying Steri-Strips. A standard dressing was applied incorporating absorbent gauze, a thigh-high TED stocking, and 6-inch Ace wrap loosely applied from above the ankle to well above the knee. The patient was aroused from sedation and transferred to the Post Anesthesia Care Unit   in satisfactory condition having tolerated the procedure well. Estimated blood loss less than 75 mL. The patient received 2 gms of Ancef preoperatively and 1000mg  of tranexamic acid in 100 cc NS IV infused over 30 minutes  intraoperatively prior to release of the tourniquet.     Leveda Anna, MD  06/19/2015 11:28 AM

## 2015-06-19 NOTE — PT Progress Note (Signed)
Physical Therapy Note    [facility]   Ambulation Note    Patient:  Darlene Landry MRN#:  [id number]  Unit:  1 NORTH ORTHO SURG Room/Bed:  A2608/A2608-01    Patient completed ambulation, able to walk 8 feet on day of surgery.     Concha Pyo PT, DPT x [phone]  06/19/2015 7:34 PM

## 2015-06-19 NOTE — Anesthesia Preprocedure Evaluation (Signed)
Anesthesia Evaluation    AIRWAY    Mallampati: II    TM distance: >3 FB  Neck ROM: full  Mouth Opening:full   CARDIOVASCULAR    cardiovascular exam normal       DENTAL    no notable dental hx       PULMONARY    pulmonary exam normal     OTHER FINDINGS    #8-9 delicate but not loose per pt                  Anesthesia Plan    ASA 2     spinal                     intravenous induction   Detailed anesthesia plan: general IV and spinal        Post op pain management: per surgeon and PNB single shot    informed consent obtained    ECG reviewed  pertinent labs reviewed  imaging results reviewed

## 2015-06-19 NOTE — Anesthesia Procedure Notes (Addendum)
Spinal    Patient location during procedure: OR  Reason for block: Primary Anesthesia In the OR    Block at Surgeon's request: Yes      Start time: 06/19/2015 8:44 AM    End time: 06/19/2015 8:48 AM    Staffing  Anesthesiologist: Catalina Gravel E  Performed by: anesthesiologist       Pre-procedure Checklist   Completed: patient identified, surgical consent, pre-op evaluation, timeout performed, risks and benefits discussed, monitors and equipment checked, anesthesia consent given and correct site  Timeout Completed:  06/19/2015 8:42 AM    Spinal  Patient monitoring: pulse oximetry and NIBP      Patient position: sitting    Skin Local: lidocaine 1%        Approach: midline  Number of attempts: 1      Needle Placement  Needle type: Pencan   Needle gauge: 25              Paresthesia Pain: yes, right and transient    Catheter Placement   CSF Return: Yes  Blood Return: No              Assessment   Block Outcome: patient tolerated procedure well, no complications and successful block      Peripheral  Patient location during procedure: PACU  Reason for block: Post-op pain managment  Injection technique: Single-shot  Block Region: Adductor canal/Mid-thigh femoral  Laterality: Left  Block at surgeon's request Yes  Start time: 06/19/2015 11:41 AM  End time: 06/19/2015 11:48 AM    Staffing  Anesthesiologist: Catalina Gravel E  Performed by: Anesthesiologist     Pre-procedure Checklist   Completed: patient identified, surgical consent, pre-op evaluation, timeout performed, risks and benefits discussed, anesthesia consent given and correct site  Timeout Completed:  06/19/2015 11:40 AM    Peripheral Block  Patient monitoring: Pulse oximetry, EKG and NIBP  Patient position: Supine  Sterile Technique: Chloraprep, Sterile gloves and Mask  Premedication: No    Needle  Needle type: Stim needle   Needle gauge: 21 G  Needle length: 4 in    Procedures: ultrasound guided  Ultrasound Guided: LA spread visualized, Needle visualized, Relevant  anatomy identified (nerve, vessels, muscle) and Image stored or printed      Assessment   Incremental injection: yes  Injection made incrementally with aspirations every 5 mL.  Injection Resistance: no  Paresthesia Pain: No    Blood Aspirated: No  no suspected intravascular injection  Patient tolerated procedure well: Yes  Block Outcome: No complications and Successful block

## 2015-06-19 NOTE — Progress Notes (Signed)
Pt tracked for possible [facility] services s/p surgical procedure. PT recommends OP PT and pt has OP PT scheduled for 9/1. Pt states she has all the equipment in the world and has no need for anything at this time. No HH services set up. Pt is deferred for home care.

## 2015-06-20 ENCOUNTER — Encounter: Payer: Self-pay | Admitting: Orthopaedic Surgery

## 2015-06-20 DIAGNOSIS — E785 Hyperlipidemia, unspecified: Secondary | ICD-10-CM | POA: Diagnosis present

## 2015-06-20 DIAGNOSIS — R519 Headache, unspecified: Secondary | ICD-10-CM | POA: Diagnosis present

## 2015-06-20 DIAGNOSIS — I10 Essential (primary) hypertension: Secondary | ICD-10-CM | POA: Diagnosis present

## 2015-06-20 DIAGNOSIS — E039 Hypothyroidism, unspecified: Secondary | ICD-10-CM | POA: Diagnosis present

## 2015-06-20 DIAGNOSIS — F419 Anxiety disorder, unspecified: Secondary | ICD-10-CM | POA: Diagnosis present

## 2015-06-20 LAB — BASIC METABOLIC PANEL
Anion Gap: 7 (ref 5.0–15.0)
BUN: 14 mg/dL (ref 7.0–19.0)
CO2: 23 mEq/L (ref 22–29)
Calcium: 8.4 mg/dL (ref 7.9–10.2)
Chloride: 103 mEq/L (ref 100–111)
Creatinine: 0.8 mg/dL (ref 0.6–1.0)
Glucose: 143 mg/dL — ABNORMAL HIGH (ref 70–100)
Potassium: 4 mEq/L (ref 3.5–5.1)
Sodium: 133 mEq/L — ABNORMAL LOW (ref 136–145)

## 2015-06-20 LAB — GFR: EGFR: >60

## 2015-06-20 LAB — RED BLOOD CELLS OR HOLD
Expiration Date: 201609122359
Expiration Date: 201609122359
UTYPE: A POS
UTYPE: A POS

## 2015-06-20 LAB — HEMOGLOBIN AND HEMATOCRIT, BLOOD
Hematocrit: 32.1 % — ABNORMAL LOW (ref 37.0–47.0)
Hgb: 10.7 g/dL — ABNORMAL LOW (ref 12.0–16.0)

## 2015-06-20 LAB — HEMOLYSIS INDEX: Hemolysis Index: 9 (ref 0–18)

## 2015-06-20 MED ORDER — FERROUS SULFATE 324 (65 FE) MG PO TBEC
324.0000 mg | DELAYED_RELEASE_TABLET | Freq: Every morning | ORAL | Status: AC
Start: 2015-06-20 — End: ?

## 2015-06-20 MED ORDER — ASPIRIN EC 81 MG PO TBEC
81.0000 mg | DELAYED_RELEASE_TABLET | Freq: Two times a day (BID) | ORAL | Status: AC
Start: 2015-06-20 — End: ?

## 2015-06-20 MED ORDER — DSS 100 MG PO CAPS
100.0000 mg | ORAL_CAPSULE | Freq: Two times a day (BID) | ORAL | Status: AC
Start: 2015-06-20 — End: ?

## 2015-06-20 MED ORDER — SENNOSIDES-DOCUSATE SODIUM 8.6-50 MG PO TABS
2.0000 | ORAL_TABLET | Freq: Two times a day (BID) | ORAL | Status: AC | PRN
Start: 2015-06-20 — End: ?

## 2015-06-20 MED ORDER — ASCORBIC ACID 500 MG PO TABS
500.0000 mg | ORAL_TABLET | Freq: Two times a day (BID) | ORAL | Status: AC
Start: 2015-06-20 — End: ?

## 2015-06-20 NOTE — Progress Notes (Signed)
Writer was informed by PT that pt needs HH PT bridge and nursing to monitor incision. Nicoline([id number]), [facility], was notified and aware.     Janie Morning  Case Management  [phone]

## 2015-06-20 NOTE — Discharge Summary (Signed)
DISCHARGE NOTE    Date Time: 06/20/2015 4:27 PM  [name], Darlene Landry  Discharging Physician: Darcey Nora, NP  Admitting Surgeon: Leveda Anna, MD    Date of Admission:   06/19/2015    Date of Discharge:   06/20/2015   Reason for Admission:   Primary localized osteoarthritis of left knee [M17.12]    Problems:   Lists the present on admission hospital problems  Present on Admission:   . Osteoarthritis of left knee  . Osteoarthritis of left knee  . Hypertensive disorder  . Hypothyroidism  . Hyperlipidemia  . Headache  . Anxiety  . Anxiety    Hospital Problems:  Principal Problem:    Osteoarthritis of left knee  Active Problems:    Osteoarthritis of left knee    Hypertensive disorder    Hypothyroidism    Hyperlipidemia    Headache    Anxiety    Anxiety      Problem Lists:  Patient Active Problem List   Diagnosis   . Spinal stenosis in cervical region   . Osteoarthritis of left knee   . Osteoarthritis of left knee   . Hypertensive disorder   . Hypothyroidism   . Hyperlipidemia   . Headache   . Anxiety   . Anxiety        Discharge Dx:   Present on Admission:   . Osteoarthritis of left knee  . Osteoarthritis of left knee  . Hypertensive disorder  . Hypothyroidism  . Hyperlipidemia  . Headache  . Anxiety  . Anxiety    Consultations:   [facility] HOME HEALTH FACE-TO-FACE (FTF) [facility] HOME HEALTH FACE-TO-FACE (FTF) ENCOUNTER     Procedures performed:   Procedure(s):  LEFT TOTAL KNEE REPLACEMENT    Hospital Course:    74 - year - old - female, with an extensive medical history, including Hypertension, Hypothyroidism, Hyperlipidemia, Arthritis of the knee, who presented to the hospital for elective surgery.  She underwent an Arthroplasty of the left total knee. Please see operative report for details on her operative course.  She received intravenous antibiotics and her pain was well managed. Her vital signs and blood work were monitored closely.  Her medications for her Hypertension, Hyperlipidemia, were  resumed.  She received Aspirin for DVT prophylaxis.  She was seen by physical therapy, ambulated 200 feet and was recommended home physical therapy at discharge.  Home health services have been arranged.  She is discharged home and will follow up with Dr Reynolds Bowl in 1 to 2 weeks.    Discharge Medications:     Current Discharge Medication List      START taking these medications    Details   aspirin EC 81 MG EC tablet Take 1 tablet (81 mg total) by mouth 2 (two) times daily.  Qty: 30 tablet, Refills: 0      docusate sodium (COLACE) 100 MG capsule Take 1 capsule (100 mg total) by mouth 2 (two) times daily.      Ferrous Sulfate 324 (65 FE) MG Tablet Delayed Response Take 1 tablet (324 mg total) by mouth every morning with breakfast.  Qty: 30 tablet, Refills: 0      oxyCODONE (OXYCONTIN) 10 MG 12 hr tablet Take 1 tablet (10 mg total) by mouth every 12 (twelve) hours.  Qty: 60 tablet, Refills: 0      oxyCODONE-acetaminophen (PERCOCET) 7.5-325 MG per tablet Take 1 tablet by mouth every 4 (four) hours as needed.  Qty: 100 tablet, Refills: 0  senna-docusate (PERICOLACE) 8.6-50 MG per tablet Take 2 tablets by mouth 2 (two) times daily as needed for Constipation.  Qty: 30 tablet, Refills: 0      vitamin C (VITAMIN C) 500 MG tablet Take 1 tablet (500 mg total) by mouth 2 (two) times daily.  Qty: 30 tablet, Refills: 0         CONTINUE these medications which have NOT CHANGED    Details   amLODIPine (NORVASC) 2.5 MG tablet Take 2.5 mg by mouth every evening.         furosemide (LASIX) 20 MG tablet Take 20 mg by mouth every morning.         gabapentin (NEURONTIN) 300 MG capsule Take 300 mg by mouth 3 (three) times daily.         levothyroxine (SYNTHROID, LEVOTHROID) 75 MCG tablet TAKE 1 TABLET BY MOUTH EVERY DAY  Refills: 1      losartan (COZAAR) 100 MG tablet Take 100 mg by mouth every evening.         Magnesium 100 MG CAPS Take by mouth.        PARoxetine (PAXIL) 10 MG tablet Take 10 mg by mouth every evening.          spironolactone (ALDACTONE) 25 MG tablet Take 25 mg by mouth every morning.            STOP taking these medications       aspirin 81 MG tablet        B Complex-Biotin-FA (B-COMPLEX PO)        Biotin 1000 MCG tablet        Cholecalciferol (VITAMIN D-3 PO)        Coenzyme Q10 (COQ-10) 200 MG CAPS        diclofenac-misoprostol (ARTHROTEC 75) 75-0.2 MG per tablet        Multiple Vitamins-Minerals (CENTRUM SILVER PO)        Multiple Vitamins-Minerals (OCUVITE EXTRA PO)        mupirocin (BACTROBAN) 2 % ointment        Omega-3 Fatty Acids (FISH OIL) 1200 MG CAPS        triamcinolone (KENALOG) 0.1 % cream        vitamin B-12 (CYANOCOBALAMIN) 500 MCG tablet        diclofenac sodium (VOLTAREN) 1 % Gel topical gel        lidocaine (LIDODERM) 5 %                Discharge Instructions:   Patient was given discharge instructions by nursing staff.    Call the office for follow-up with Dr Reynolds Bowl in 1 to 2 weeks.    Signed by: Darcey Nora, NP  06/20/2015 4:27 PM

## 2015-06-20 NOTE — PT Progress Note (Signed)
Physical Therapy Note    [facility]  Physical Therapy Treatment    Patient:  Darlene Landry MRN#:  [id number]  Unit:  [address] SURG Room/Bed:  A2608/A2608-01    Time of treatment: Time Calculation  PT Received On: 06/20/15  Start Time: 0805  Stop Time: 0905  Time Calculation (min): 60 min             Visit # 3    Precautions:   Precautions  Weight Bearing Status: RLE total weight bearing, LLE total weight bearing  Total Knee Replacement: knee immobilizer, OOB (removed 2/2 good L quad control. )  Precaution Instructions Given to Patient: Yes  Restricted BP Precautions: no R UE  Other Precautions: Fall    Updated X-Rays/Tests/Labs:  Lab Results   Component Value Date/Time    HGB 10.7* 06/20/2015 05:00 AM    HEMATOCRIT 32.1* 06/20/2015 05:00 AM    POTASSIUM 4.0 06/20/2015 05:00 AM    SODIUM 133* 06/20/2015 05:00 AM         Subjective:   "I had my other knee done 4.5 years ago. I fell down the stairs and broke my neck. I was paralyzed for 3 months. My right side is still weak."  "I have to take my time when I stand up or else I get dizzy and lightheaded."  "I wear finger braces so I can let go of things easier. It normally only takes a few seconds to get them on, I don't know why I'm struggling today."  "I have a stair lift at home, but I have 4 steps that are about 4 feet deep I need to do to get inside."  "I'm getting lightheaded, but it's not unbearable."  "It's getting worse. I need to sit down."  "I really want to go home today."         Pain Assessment  Pain Assessment: Numeric Scale (0-10)  Pain Score: 7-severe pain (5 at rest)  POSS Score: Awake and Alert  Pain Location: Knee  Pain Orientation: Left  Pain Frequency: Constant/continuous;Increases with movement  Pain Intervention(s): Cold applied;Repositioned    Patient's medical condition is appropriate for Physical Therapy intervention at this time.  Patient is agreeable to participation in the therapy session. Family and/or guardian are  agreeable to patient's participation in the therapy session. Nursing clears patient for therapy.    Objective:  PTA reviewed chart prior to treatment.    POD #1 s/p L TKA by Dr. Reynolds Bowl    Observation of Patient/Vital Signs:    06/20/15 0811 06/20/15 0828 06/20/15 0838   Vital Signs   Heart Rate 86 87 68   BP 120/60 mmHg 141/63 mmHg 101/56 mmHg   BP Location Left arm Left arm Left arm   Patient Position Lying Sitting Standing   Oxygen Therapy   SpO2 98 % 98 % 96 %   O2 Device None (Room air) None (Room air) None (Room air)       06/20/15 0851 06/20/15 0903   Vital Signs   Heart Rate (!) 121 79   BP 98/53 mmHg 111/53 mmHg   BP Location Left arm Left arm   Patient Position Sitting Lying   Oxygen Therapy   SpO2 93 % 98 %   O2 Device None (Room air) None (Room air)         Patient received in bed with intravenous (IV) line, foot pumps, bed alarm  and dressing in place.    Cognition/Neuro Status  Arousal/Alertness: Appropriate responses to stimuli  Attention Span: Appears intact  Orientation Level: Oriented X4  Memory: Appears intact  Following Commands: Follows all commands and directions without difficulty  Safety Awareness: minimal verbal instruction  Insights: Fully aware of deficits;Educated in safety awareness  Behavior: attentive;calm;cooperative    Musculoskeletal Examination                  Functional Mobility  Supine to Sit: Minimal Assist;Increased Time;Increased Effort;HOB raised;to Left  Sit to Supine: Minimal Assist;to Right  Sit to Stand:  (SBA from high surfaces; min assist from low surfaces)  Stand to Sit: Contact Guard Assist  Transfers  Bed to Chair: Stand by Assist  Chair to Bed: Contact Guard Assist  Locomotion  Ambulation: Stand by Assist;with front-wheeled walker  Ambulation Distance (Feet): 20 Feet  Pattern: L decreased stance time;R foot decreased clearance;L foot decreased clearance;decreased cadence;decreased step length;Step to    Therapeutic Exercise  Quad Sets: 1x10 L/LE  Heelslides: 1x10  L/LE  Knee AROM Short Arc Quad: 1x10 B/LE  Ankle Pumps: 1x10 B/LE                 Educated the patient to role of physical therapy, plan of care, goals of therapy and HEP, safety with mobility and ADLs, weight bearing precautions, discharge instructions, home safety.  Patient left in bed with foot pumps and bed alarm in place and call bell and all personal items/needs within reach. RN notified of session outcome. Pt declined cryocuff.      Assessment:  Patient has not met clinical pathway goals for gait and transfers; and discharge instructions provided with coach present.     Therapist discussed recommendation(s) of home health and outpatient physical therapy  with patient and family member. Patient was agreeable; however, pt also educated that discharge recommendation may change pending pt's progress.    Patient is not cleared for D/C from PT standpoint. RN notified at 9:51.    Pt reported increased clawing in R hand today compared to baseline, making donning finger braces more difficult than usual. Pt requires min assist when transferring sit-stand from lower surface, but able to transfer from taller surface c/ SBA. Pt limited c/ gait training this session secondary to +orthostatic hypotension Pt would benefit from continued PT to improve OOB activity tolerance.    Plan: Treatment/Interventions: Exercise, Gait training, Stair training, Functional transfer training, Neuromuscular re-education, LE strengthening/ROM, Endurance training, Patient/family training, Equipment eval/education, Bed mobility      PT Frequency: twice a day   Continue plan of care.    Goals: Goals  Goal Formulation: With patient/family  Time for Goal Acheivement: By time of discharge  Goals: Select goal  Pt Will Go Supine To Sit: independent, to maximize functional mobility and independence, Partly met (Min assist on 8/30)  Pt Will Perform Sit to Stand: independent, to maximize functional mobility and independence, Partly met (Min assist on  8/30)  Pt Will Transfer Bed/Chair: with rolling walker, modified independent, to maximize functional mobility and independence, Partly met (SBA c/ RW on 8/30)  Pt Will Ambulate: 151-200 feet, with rolling walker, with supervision, to maximize functional mobility and independence, Partly met (20' SBA c/ RW on 8/30)  Pt Will Go Up / Down Stairs: 3-5 stairs, With rail, with stand by assist, With SPC, to maximize functional mobility and independence (NT)  Pt Will Perform Home Exer Program: independent, to maximize functional mobility and independence, Partly met  Other Goal: Pt will increase L knee AROM  to at least -5 to 95 deg flexion AROM. (Partly Met)  Other Goal #2: Pt will ascend/descend 4 platform steps with RW and SBA. (Partly Met. )        DME Recommended for Discharge: Other (Comment) (appears to have in place)  Discharge Recommendation: Home with supervision, Home with outpatient PT, Other (Comment) (may require HHPT bridge if doesnt demo appropriate progress)      Rexene Edison license # [id number]    Physical Medicine and Rehabilitation  [facility]  [phone]    06/20/2015  9:54 AM

## 2015-06-20 NOTE — Progress Notes (Signed)
06/20/15 1622   Discharge Disposition   Patient preference/choice provided? N/A   Physical Discharge Disposition Home with Needs   Name of Home Health Agency [facility]   Name of DME Agency [facility]   Mode of Transportation Car   Patient/Family/POA notified of transfer plan Yes   Patient agreeable to discharge plan/expected d/c date? Yes   Family/POA agreeable to discharge plan/expected d/c date? Yes   Bedside nurse notified of transport plan? Yes   Outpatient Services   Home Health Skilled Nursing;Home PT/OT/ST   CM Interventions   Follow up appointment scheduled? No   Reason no follow up scheduled? Family to schedule;Other (comment)  (CM unable to set up discharge f/u appt due to clinic is closed. pt reported she or spouse will call her PCP and set up her d/c f/u appt.)   Referral made for home health RN visit? Yes   Multidisciplinary rounds/family meeting before d/c? Yes   Medicare Checklist   Is this a Medicare patient? Yes   Patient received 1st IMM Letter? Yes   3 midnight inpatient qualifying stay (SNF only) No   If LOS 3 days or greater, did patient received 2nd IMM Letter? n/a   Medicaid/DMAS   DMAS 95 MI/MR completed and faxed No   Janie Morning  Case Management  [phone]

## 2015-06-20 NOTE — PT Progress Note (Signed)
Physical Therapy Note    [facility]  Physical Therapy Treatment    Patient:  Darlene Landry MRN#:  [id number]  Unit:  [address] Room/Bed:  A2608/A2608-01    Time of treatment: Time Calculation  PT Received On: 06/20/15  Start Time: 1400  Stop Time: 1445  Time Calculation (min): 45 min             Visit # 4     Precautions:   Precautions  Weight Bearing Status: RLE total weight bearing, LLE total weight bearing  Total Knee Replacement: knee immobilizer, OOB (removed 2/2 good L quad control. )  Precaution Instructions Given to Patient: Yes  Restricted BP Precautions: no R UE  Other Precautions: Fall    Updated X-Rays/Tests/Labs:  Lab Results   Component Value Date/Time    HGB 10.7* 06/20/2015 05:00 AM    HEMATOCRIT 32.1* 06/20/2015 05:00 AM    POTASSIUM 4.0 06/20/2015 05:00 AM    SODIUM 133* 06/20/2015 05:00 AM         Subjective:   "I feel better than I did this morning."  "I'd feel more comfortable if you just kept a hand on me. My knee buckles sometimes."  "I have a chair lift at home. There are 4 steps outside that are deep enough for the wheelchair. Worst comes to worst, I can get to the front door using the wheelchair."         Pain Assessment  Pain Assessment: Numeric Scale (0-10)  Pain Score: 5-moderate pain  POSS Score: Awake and Alert  Pain Location: Knee  Pain Orientation: Left  Pain Frequency: Constant/continuous  Pain Intervention(s): Cold applied;Repositioned    Patient's medical condition is appropriate for Physical Therapy intervention at this time.  Patient is agreeable to participation in the therapy session. Family and/or guardian are agreeable to patient's participation in the therapy session. Nursing clears patient for therapy.    Objective:  PTA reviewed chart prior to treatment.    POD #1 s/p L TKA by Dr. Reynolds Bowl    Observation of Patient/Vital Signs:    06/20/15 1406 06/20/15 1410 06/20/15 1411   Vital Signs   Heart Rate 69 72 77   BP 102/50 mmHg 100/54 mmHg 104/51 mmHg    BP Location --  Left arm Left arm   Patient Position Lying Sitting Standing   Oxygen Therapy   SpO2 96 % 91 % 92 %   O2 Device None (Room air) None (Room air) None (Room air)       06/20/15 1442   Vital Signs   Heart Rate 71   BP 112/56 mmHg   BP Location Left arm   Patient Position Lying   Oxygen Therapy   SpO2 98 %   O2 Device None (Room air)         Patient received in bed with intravenous (IV) line, foot pumps, bed alarm , cryocuff and dressing in place.    Cognition/Neuro Status  Arousal/Alertness: Appropriate responses to stimuli  Attention Span: Appears intact  Orientation Level: Oriented X4  Memory: Appears intact  Following Commands: Follows all commands and directions without difficulty  Safety Awareness: minimal verbal instruction  Insights: Fully aware of deficits;Educated in safety awareness  Behavior: attentive;calm;cooperative    Musculoskeletal Examination                  Functional Mobility  Supine to Sit: Contact Guard Assist;Increased Time;Increased Effort;HOB raised;to Left  Sit to Supine: Contact Guard  Assist;to Right  Sit to Stand:  (SBA from high surfaces; min assist from low surfaces)  Stand to Sit: Stand by Assist  Transfers  Bed to Chair: Stand by Assist  Chair to Bed: Stand by Assist  Device Used for Functional Transfer: front-wheeled walker  Locomotion  Ambulation: Contact Guard Assist;with front-wheeled walker  Ambulation Distance (Feet): 160 Feet (1x160; 1x50)  Pattern: L decreased stance time;R foot decreased clearance;L foot decreased clearance;decreased cadence;decreased step length;Step to  Stair Management: Contact Guard Assist;step to pattern;with gait belt (2 rails ascending forward; 1 rail descending sideways)  Number of Stairs: 4    Therapeutic Exercise  Quad Sets: 1x10 L/LE  Heelslides: 1x10 L/LE  Knee AROM Short Arc Quad: 1x10 B/LE  Ankle Pumps: 1x10 B/LE                 Educated the patient to role of physical therapy, plan of care, goals of therapy and safety with  mobility and ADLs, weight bearing precautions, discharge instructions, home safety.  Patient left in bed with foot pumps, bed alarm and cryocuff in place and call bell and all personal items/needs within reach. RN notified of session outcome.       Assessment:   Patient has met clinical pathway goals for gait and transfers; and discharge instructions provided with coach present.     Therapist discussed recommendation(s) of supervision and home health with patient and family member. Patient was agreeable.    Patient is cleared for D/C from PT standpoint. RN notified at 14:47.    Pt is progressing well c/ transfer, gait, and stair training. Pt reported occasional R knee buckling as baseline secondary to history of broken neck, but did not demonstrate any knee buckling this session. Pt given CGA secondary to pt comfort and concerns to PMH of knee buckling. Pt able to ascend 4 stairs c/ B hand rails and descend stairs c/ single rail sideways per PLOF. Pt would benefit from continued PT to maximize post surgical outcomes, but no longer requires 2x/day frequency. Reducing frequency per protocol.    Discussed pt's progress and goals c/ evaluating PT, Concha Pyo, PT, DPT. Updating discharge recommendation to home c/ home health and supervision.           Plan: Treatment/Interventions: Exercise, Gait training, Stair training, Functional transfer training, Neuromuscular re-education, LE strengthening/ROM, Endurance training, Patient/family training, Equipment eval/education, Bed mobility      PT Frequency: 4-5x/wk   Continue plan of care.    Goals: Goals  Goal Formulation: With patient/family  Time for Goal Acheivement: By time of discharge  Goals: Select goal  Pt Will Go Supine To Sit: independent, to maximize functional mobility and independence, Partly met (CGA on 8/30)  Pt Will Perform Sit to Stand: independent, to maximize functional mobility and independence, Partly met (CGA on 8/30)  Pt Will Transfer Bed/Chair: with  rolling walker, modified independent, to maximize functional mobility and independence, Partly met (SBA c/ RW on 8/30)  Pt Will Ambulate: 151-200 feet, with rolling walker, with supervision, to maximize functional mobility and independence, Partly met (160' CGA c/ RW on 8/30)  Pt Will Go Up / Down Stairs: 3-5 stairs, With rail, with stand by assist, With [facility], to maximize functional mobility and independence, Partly met (4 stairs CGA c/ 2 rails on 8/30)  Pt Will Perform Home Exer Program: independent, to maximize functional mobility and independence, Partly met  Other Goal: Pt will increase L knee AROM to at least -5 to 95 deg  flexion AROM. (Partly Met)  Other Goal #2: Pt will ascend/descend 4 platform steps with RW and SBA. (Partly Met. )        DME Recommended for Discharge: Other (Comment) (appears to have in place)  Discharge Recommendation: Home with supervision, Home with home health PT      Rexene Edison license # [id number]    Physical Medicine and Rehabilitation  [facility]  [phone]    06/20/2015  3:28 PM

## 2015-06-20 NOTE — Progress Notes (Signed)
Home Health Referral          Referral from Henrietta Dine (Case Manager) for home health care upon discharge.    By [employer], the patient has the right to freely choose a home care provider.  Arrangements have been made with:     A company of the patients choosing. We have supplied the patient with a listing of providers in your area who asked to be included and participate in Medicare.   [facility], a home care agency that provides both adult home care services which is a wholly owned and operated by [employer] and participates in [employer]   The preferred provider of your insurance company. Choosing a home care provider other than your insurance company's preferred provider may affect your insurance coverage.    The Home Health Care Referral Form acknowledging the voluntary selection of the home care company has been completed, signed, and is on file.      Home Health Discharge Information     Your doctor has ordered Skilled Nursing and Physical Therapy in-home service(s) for you while you recuperate at home, to assist you in the transition from hospital to home.    The agency that you or your representative chose to provide the service:  Name of Home Health Agency: [facility] [phone])    The Medical Equipment Company:  Name of DME Agency: [facility] [phone])  Equipment Ordered: CPM    The above services were set up by:  Daiva Eves  [facility] Liaison)   Phone [phone]    Signed by: Daiva Eves  Date Time: 06/20/2015 3:54 PM

## 2015-06-20 NOTE — Plan of Care (Signed)
Problem: Physical Therapy  Goal: Patient condition is improving per Physical Therapy Treatment Plan  Outcome: Progressing  Discharge Recommendation: Home with supervision, Home with outpatient PT, Other (Comment) (may require HHPT bridge if doesnt demo appropriate progress)  DME Recommended for Discharge: Other (Comment) (appears to have in place)    Is an Occupational Therapy Evaluation Indicated at this time? No, the patient does not require a OT evaluation.    Treatment/Interventions: Exercise, Gait training, Stair training, Functional transfer training, Neuromuscular re-education, LE strengthening/ROM, Endurance training, Patient/family training, Equipment eval/education, Bed mobility  PT Frequency: twice a day     Early/Progressive Mobility Protocol Level: Step 7: Ambulate out of Room (with assistance)  Updated on Patient's Flip Chart in Room  (Please See Therapy Evaluation for device and assistance level needed)    Goals:   Goals  Goal Formulation: With patient/family  Time for Goal Acheivement: By time of discharge  Goals: Select goal  Pt Will Go Supine To Sit: independent, to maximize functional mobility and independence, Partly met (Min assist on 8/30)  Pt Will Perform Sit to Stand: independent, to maximize functional mobility and independence, Partly met (Min assist on 8/30)  Pt Will Transfer Bed/Chair: with rolling walker, modified independent, to maximize functional mobility and independence, Partly met (SBA c/ RW on 8/30)  Pt Will Ambulate: 151-200 feet, with rolling walker, with supervision, to maximize functional mobility and independence, Partly met (20' SBA c/ RW on 8/30)  Pt Will Go Up / Down Stairs: 3-5 stairs, With rail, with stand by assist, With SPC, to maximize functional mobility and independence (NT)  Pt Will Perform Home Exer Program: independent, to maximize functional mobility and independence, Partly met  Other Goal: Pt will increase L knee AROM to at least -5 to 95 deg flexion AROM.  (Partly Met)  Other Goal #2: Pt will ascend/descend 4 platform steps with RW and SBA. (Partly Met. )

## 2015-06-20 NOTE — Progress Notes (Signed)
Post-op Day 1    Date:  06/20/2015 Time:  7:50 AM      Visit Type:  Inpatient Room #   A2608/A2608-01    Subjective:  " I feel pretty good".    Type of block: Adductor Canal    Objective:  Pain Score  4                    Motor Block  No                    Sensory Block  No                    Catheter site  N/A    Assessment:  Surgical pain controlled  Patient denies nausea and vomiting. Voiding without difficulty.    Plan:  No further management    Would pt receive block again?  Yes

## 2015-06-20 NOTE — Discharge Instructions (Addendum)
CALL 911 FOR CHEST PAIN OR DIFFICULTY BREATHING John L. Reynolds Bowl, MD  [facility]  Phone Number: [phone]      1. Walk full weight bearing with crutches or walker as instructed by physical therapist.    2.  Keep incision dry & cover knee with plastic wrap for showering ( DO NOT SUBMERGE KNEE UNDER WATER)    3.  Use cpm as instructed 4-6 hours per day.  Your cpm is currently set at *** degrees flexion.  Advance flexion 5-10 degrees  Per day.  Goal is 0-125 degrees within 2 weeks.    4.  Take pain medication as prescribed, especially before physical therapy whether at home or as outpatient.      5.  Use anti-coagulant as prescribed, call if you have any questions.    6.  Make appointment for outpatient therapy to begin 7-10 days from discharge.    7.  Call Dr. Idalia Needle office for follow up appointment approximately 2 weeks post surgery.    8. Thigh high teds to be worn at all times until follow up appointment, remove only for        hygiene / bathing.      CALL 911 FOR CHEST PAIN OR DIFFICULTY BREATHING     Home Health Discharge Information     Your doctor has ordered Skilled Nursing and Physical Therapy in-home service(s) for you while you recuperate at home, to assist you in the transition from hospital to home.    The agency that you or your representative chose to provide the service:  Name of Home Health Agency: [facility] [phone])    The Medical Equipment Company:  Name of DME Agency: [facility] [phone])  Equipment Ordered: CPM    The above services were set up by:  Daiva Eves  [facility] Liaison)   Phone [phone]

## 2015-06-20 NOTE — Progress Notes (Signed)
Provided pt/family with discharge instructions and prescriptions. Educated on knee precautions. Reviewed accessing MyChart. Discussed MD follow up. Pt/family verbalized understanding and denied any further questions/concerns. Removed pt IV, catheter intact. Pt left with transport in wheelchair with belongings,

## 2015-06-20 NOTE — Progress Notes (Signed)
Pt is POD 1 R TKA.  Pt had orthostatic hypotension during PT today. PT will re-eval pt again later.  Nicoline, [facility], was notified, via email, re: pt may need HH PT bridge.  Pt has outpatient PT scheduled on 06/23/2015.  DCP will continue to f/u with needs.    Janie Morning  Case Management  [phone]

## 2015-06-20 NOTE — Progress Notes (Signed)
Patient referral for CPM Equipment Ordered faxed to [facility] [phone]. CPM machine will be delivered to patient's home.

## 2015-06-20 NOTE — Plan of Care (Signed)
Problem: Physical Therapy  Goal: Patient condition is improving per Physical Therapy Treatment Plan  Outcome: Progressing  Discharge Recommendation: Home with supervision, Home with home health PT  DME Recommended for Discharge: Other (Comment) (appears to have in place)    Is an Occupational Therapy Evaluation Indicated at this time? No, the patient does not require a OT evaluation.    Treatment/Interventions: Exercise, Gait training, Stair training, Functional transfer training, Neuromuscular re-education, LE strengthening/ROM, Endurance training, Patient/family training, Equipment eval/education, Bed mobility  PT Frequency: twice a day     Early/Progressive Mobility Protocol Level: Step 7: Ambulate out of Room (with assistance)  Updated on Patient's Flip Chart in Room  (Please See Therapy Evaluation for device and assistance level needed)    Goals:   Goals  Goal Formulation: With patient/family  Time for Goal Acheivement: By time of discharge  Goals: Select goal  Pt Will Go Supine To Sit: independent, to maximize functional mobility and independence, Partly met (CGA on 8/30)  Pt Will Perform Sit to Stand: independent, to maximize functional mobility and independence, Partly met (CGA on 8/30)  Pt Will Transfer Bed/Chair: with rolling walker, modified independent, to maximize functional mobility and independence, Partly met (SBA c/ RW on 8/30)  Pt Will Ambulate: 151-200 feet, with rolling walker, with supervision, to maximize functional mobility and independence, Partly met (160' CGA c/ RW on 8/30)  Pt Will Go Up / Down Stairs: 3-5 stairs, With rail, with stand by assist, With [facility], to maximize functional mobility and independence, Partly met (4 stairs CGA c/ 2 rails on 8/30)  Pt Will Perform Home Exer Program: independent, to maximize functional mobility and independence, Partly met  Other Goal: Pt will increase L knee AROM to at least -5 to 95 deg flexion AROM. (Partly Met)  Other Goal #2: Pt will ascend/descend 4  platform steps with RW and SBA. (Partly Met. )

## 2015-06-21 LAB — LAB USE ONLY - HISTORICAL SURGICAL PATHOLOGY

## 2015-06-22 ENCOUNTER — Inpatient Hospital Stay: Payer: Medicare Other | Admitting: Rehabilitative and Restorative Service Providers"

## 2015-06-27 ENCOUNTER — Inpatient Hospital Stay: Payer: Medicare Other | Admitting: Rehabilitative and Restorative Service Providers"

## 2015-06-29 ENCOUNTER — Inpatient Hospital Stay: Payer: Medicare Other | Admitting: Rehabilitative and Restorative Service Providers"

## 2015-07-04 ENCOUNTER — Inpatient Hospital Stay: Payer: Medicare Other | Admitting: Rehabilitative and Restorative Service Providers"

## 2015-07-05 ENCOUNTER — Inpatient Hospital Stay: Payer: Medicare Other | Admitting: Rehabilitative and Restorative Service Providers"

## 2015-07-06 ENCOUNTER — Inpatient Hospital Stay: Payer: Medicare Other | Attending: Orthopaedic Surgery | Admitting: Rehabilitative and Restorative Service Providers"

## 2015-07-06 ENCOUNTER — Encounter: Payer: Self-pay | Admitting: Rehabilitative and Restorative Service Providers"

## 2015-07-06 VITALS — BP 138/70 | HR 73

## 2015-07-06 DIAGNOSIS — Z96652 Presence of left artificial knee joint: Secondary | ICD-10-CM | POA: Insufficient documentation

## 2015-07-06 DIAGNOSIS — M25562 Pain in left knee: Secondary | ICD-10-CM | POA: Insufficient documentation

## 2015-07-06 NOTE — PT/OT Therapy Note (Signed)
INITIAL EVALUATION (Knee)    Name: Darlene Landry Age: 74 y.o. Occupation: Retired SOC: 07/06/2015  Referring Physician: Leveda Anna, MD MD recheck: 10/13 DOS: Date of Surgery: 06/19/15 DOI: Onset of Problem / Injury: 06/19/15  # of Authorized Visits:   Visit #      Diagnosis (Treating/Medical):     ICD-10-CM    1. Acute pain of left knee M25.562    2. 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  Patient used wheelchair intermittently after surgery but hasn't the last couple days.  Using front wheel walker currently in home and community.  Saw surgeon yesterday and had xrays (unremarkable).  Patient was referred to PT and will F/U with MD on 10/13.    Patient's reason for seeking PT /Goal: L knee pain; Abnormal ambulation with assistive device; patient reports she walks backwards up stairs to get into the house because it's easier.  Patient uses stair lift in house    Past Medical History:   Past Medical History   Diagnosis Date   . Hypertensive disorder    . Hypothyroidism    . Hyperlipidemia    . Spinal cord injury without spinal bone injury    . Cervical stenosis of spinal canal    . Heart murmur      "occasionally"   . Headache    . Neuropathy      left foot more than right, both hands, right arm (post spinal cord injury)   . Arthritis    . Eczema      legs   . Fracture      cervical, fx left arm age 52, fx facial bone   . Bilateral cataracts      had CE bilat   . Anxiety      well controlled     Medications: .  amLODIPine (NORVASC) 2.5 MG tablet  .  aspirin EC 81 MG EC tablet  .  docusate sodium (COLACE) 100 MG capsule  .  DOXYCYCLINE PO  .  Ferrous Sulfate 324 (65 FE) MG Tablet Delayed Response  .  furosemide (LASIX) 20 MG tablet  .  gabapentin (NEURONTIN) 300 MG capsule  .  levothyroxine (SYNTHROID, LEVOTHROID) 75 MCG tablet  .  losartan  (COZAAR) 100 MG tablet  .  Magnesium 100 MG CAPS  .  oxyCODONE (OXYCONTIN) 10 MG 12 hr tablet  .  PARoxetine (PAXIL) 10 MG tablet  .  senna-docusate (PERICOLACE) 8.6-50 MG per tablet  .  vitamin C (VITAMIN C) 500 MG tablet   Fall Risks: High, History of fall(s), Impaired balance/gait, Impaired mobility, Orthopedic patient Fall Risk Comments: history of spinal cord injury with R>L sided weakness; last fall in december 2015  Other Treatment/Prior Therapy: Yes home PT x 2 weeks  Prior Hospitalization: Yes [facility] x 1 night  Hand Dominance: Dominant Hand: Left Involved Side: Involved Side: Left   DiagnosticTests: L knee xrays (unremarkable)    Outcome Measure:               LEFS L Score: 28                          % Pain Score: 50% Rate Satisfaction with Current Function: 5/10   PLOF: Patient  was ambulating with SPC in community but without AD in home.  Patient uses stair lift to get between main floor and upstairs at home (since spinal cord injury in 2011)  Living Environment: Type of Residence: Multi-story home      Dwelling Entrance: # Steps to Enter: 4   Patient lives with: Living Arrangements: Spouse/significant other    OBJECTIVE:    Vitals: BP: 138/70 mmHg Heart Rate: 73 Comments: taken in L UE while seated    Observation/Posture/Gait/Integumentary:  Observation of posture: Deficits noted: Forward Head and Rounded Shoulders  Ambulation: with Front wheel walker  Functional Strength:   Sit to Stand: UE Assist    Range of Motion: (degrees)  Initial Right  AROM InitialRight  PROM   Right  AROM   Right PROM Knee InitialLeft AROM InitialLeft PROM   Left AROM   Left PROM   102    Flexion 80      0    Extension -13      (blank fields were intentionally left blank)    Hip AROM: Limitations weakness due to SCI  Ankle AROM: Limitations weakness due to SCI    Initial   R   R LE Strength  MMT /5 Initial  L    L   4  Hip Flexion 4      Hip Extension     5  Hip Abduction 5    4  Hip Adduction 4    5-  Quadriceps 4+     5-  Hamstrings 4+    5  Ankle Dorsiflexion 4+    5  Ankle Plantarflexion 5    (blank fields were intentionally left blank)    Girth/Edema: Other: See below    Initial R  Initial L   Mid Patellar 48.5  50.3   (blank fields were intentionally left blank)    Integumentary: Surgical incisions healing without signs of infection or drainage on L anterior knee (steri strips still in place)  Palpation: Pain to palpation: hypersensitive in L LE secondary to SCI   Patellar Mobility: hypomobile Direction: superior, inferior, medial and lateral    Flexibility:    Comment:   Hamstrings Restricted Left    Quadriceps Restricted Left    Piriformis NT    ITBand NT    Iliopsoas NT    Gastroc Restricted Left      Sensation to Light touch: Comment: hypersensitive to touch on R LE/UE (since spinal cord injury)    Treatment Initial Visit:  Evaluation (25 min)  Therapeutic Activity: Discussed surgical procedure, role of PT; importance of regular elevation and ice to help reduce pain and edema; Discussed importance of use of walker for safety until balance improves for [facility] use; Reviewed POC; Discussed importance of knee extension and avoidance of pillow under knee during prolonged positioning  Barriers to rehabilitation: None  Rehab Potential:good  Is patient aware of diagnosis: Yes  For Next Visit Add Initiate therex program    [name]. Tilman Neat, DPT [place] # [phone]  07/06/2015    Time In/Out:  12:55 pm - 1:35 pm Total Treatment Time:  40 minutes    Plan of Care IPTC Medicare Provider #: [id number]                [name]  MRN: [id number]  [facility] Medicare #: Medicare Sub. Num: [id number]  DOI: Onset of Problem / Injury: 06/19/15 Date of Surgery: 06/19/15 SOC: 07/06/2015     Diagnosis:  ICD-10-CM    1. Acute pain of left knee M25.562    2. 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 female presenting with L knee pain who requires Physical Therapy for the following:  Impairments: abnormal gait; reduced L knee AROM/strength; limited proprioception; reduced L patellar mobility; L LE muscle tightness    Functional Limitations: L knee pain; Abnormal ambulation with assistive device; patient reports she walks backwards up stairs to get into the house because it's easier.  Patient uses stair lift in house    Plan Of Care: Body Mechanics Education, NMR, Proprioceptive Activites, Statistician, Instruction in HEP, Therapeutic Exercise, Balance/Gait training, Soft Tissue/Joint Mobilization Grade I-IV L patellofemoral and tibiofemoral joint MOBS and Other: taping  Frequency/Duration: 3 times a week for 4 weeks followed 2 times a week for 2 weeks. Anticipated D/C date: 08/17/15    Certification Dates: From: 07/06/15  To: 08/17/15    Goals:   End of Certification   Date (Body Area, Impairment Goal, Functional   Activity, Target Performance) Time Frame Status Date/  Initial   07/06/2015   Patient will demonstrate independence in prescribed HEP with proper form, sets and reps for safe discharge to an independent program.  6 weeks Initial Eval     07/06/2015   Increase knee flexion AROM to 120 degrees to allow patient to safely negotiate stairs reciprocally with railing.  6 weeks Initial Eval    07/06/2015   Increase knee extension AROM to 0 degrees to allow sleeping with <2 interruptions due to pain.  4 weeks Initial Eval    07/06/2015   Increase single leg stance to 10 seconds so patient can negotiate curbs and uneven surfaces safely.  6 weeks Initial Eval    07/06/2015   Increase L knee and gastroc strength to 5/5 to increase community ambulation to 30-45 minutes for necessary MD appts and grocery shopping  6 weeks Initial Eval      Signature: [name].  [place], PT, DPT [place] # [phone]  Date: 07/06/2015    Signature: Leveda Anna, MD ___________________________ Date:     [name]  MRN: [id number]

## 2015-07-10 ENCOUNTER — Inpatient Hospital Stay: Payer: Medicare Other | Admitting: Rehabilitative and Restorative Service Providers"

## 2015-07-10 DIAGNOSIS — M25562 Pain in left knee: Secondary | ICD-10-CM

## 2015-07-10 DIAGNOSIS — Z96652 Presence of left artificial knee joint: Secondary | ICD-10-CM

## 2015-07-10 NOTE — PT/OT Therapy Note (Signed)
DAILY NOTE  07/10/2015    Time In/Out: 3:00 pm - 3:45 pm Total Treatment Time: 45 minutes Visit Number:  2    Certification Status Ends: 2015/08/21   G-Codes required on visit # 10    # of Authorized Visits:   Visit #:        Diagnosis (Treating/Medical):     ICD-10-CM    1. Acute pain of left knee M25.562    2. 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  Functional Changes: Patient states her knee hurts the most when she bends her knee.  She states that she wants PT to look at her incision because she's concerned due to steri strips falling off.    Objective:   Treatment:  Therapeutic Exercise: to improve: Flexibility/ROM, Stabilization and Strength   Assisted Warm-up on combo x 6'  Modifications/Patient Education: Initiated therex program (Verbal, Visual and Tactile cues for new therex form)    NMR:  SAQ, QS, and SLR flex to improve terminal knee extension through increased quadriceps muscle activation    Manual Therapy:   STM to L knee  Gentle L HS/gastroc stretching  Grade III L patellar MOBS      Current Measurements (ROM, Strength, Girth, Outcomes, etc.):   NT    Modalities: None  Therapy Rationale: Other: Patient declines       Assessment (response to treatment):   Patient's incision looks unremarkable without signs of drainage or infection.  Patient is nervous about therex initiation secondary to knee pain with movement.  She requires PT assistance with SLR flex. Manual therapy is gentle and focuses on patellar mobs, STM, and muscle stretching to reduce patient guarding.    Progress towards functional goals: Initiated therex program    Patient requires continued skilled care to: Normalize L knee ROM and strength    Plan:  Continue with Plan of Care    [name]. [name], PT, DPT [place] # [phone]  07/10/2015

## 2015-07-10 NOTE — PT/OT Exercise Plan (Signed)
Name: Darlene Landry  Referring Physician: Leveda Anna, MD  Diagnosis:    ICD-10-CM    1. Acute pain of left knee M25.562    2. 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        Precautions:  Hx of SCI  Date of Surgery:    MD Follow-up: 701 due date:        Exercise Flow Sheet    Exercise Specifics July 21, 2015  IE 07-25-15       G-codes/PN     Combo    Combo x 6'  LMS            Heel slides   20 x with sliding board and strap  LMS  10x AROM  LMS            SAQ   Over can  30x  LMS          QS over towel     30 x 5"  LMS            SLR flex   QS with SLR flex  10x  AAROM by PT  LMS                                                                        Manual   See note  LMS          CP/MH PRN     NT          Home Exercise Program     Issue next visit  LMS          (Initials = supervised exercise by clinician)

## 2015-07-12 ENCOUNTER — Inpatient Hospital Stay: Payer: Medicare Other

## 2015-07-12 NOTE — PT/OT Plan of Care (Signed)
Plan of Care IPTC Medicare Provider #: [phone]                [name]  MRN: [id number]  [facility] Medicare #: Medicare Sub. Num: [id number]  DOI: Onset of Problem / Injury: 06/19/15 Date of Surgery: 06/19/15 SOC: 07/06/2015     Diagnosis:     ICD-10-CM    1. Acute pain of left knee M25.562    2. 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 female presenting with L knee pain who requires Physical Therapy for the following:  Impairments: abnormal gait; reduced L knee AROM/strength; limited proprioception; reduced L patellar mobility; L LE muscle tightness    Functional Limitations: L knee pain; Abnormal ambulation with assistive device; patient reports she walks backwards up stairs to get into the house because it's easier.  Patient uses stair lift in house    Plan Of Care: Body Mechanics Education, NMR, Proprioceptive Activites, Statistician, Instruction in HEP, Therapeutic Exercise, Balance/Gait training, Soft Tissue/Joint Mobilization Grade I-IV L patellofemoral and tibiofemoral joint MOBS and Other: taping  Frequency/Duration: 3 times a week for 4 weeks followed 2 times a week for 2 weeks. Anticipated D/C date: 08/17/15    Certification Dates: From: 07/06/15  To: 08/17/15    Goals:   End of Certification   Date (Body Area, Impairment Goal, Functional   Activity, Target Performance) Time Frame Status Date/  Initial   07/06/2015   Patient will demonstrate independence in prescribed HEP with proper form, sets and reps for safe discharge to an independent program.  6 weeks Initial Eval     07/06/2015   Increase knee flexion AROM to 120 degrees to allow patient to safely negotiate stairs reciprocally with  railing.  6 weeks Initial Eval    07/06/2015   Increase knee extension AROM to 0 degrees to allow sleeping with <2 interruptions due to pain.  4 weeks Initial Eval    07/06/2015   Increase single leg stance to 10 seconds so patient can negotiate curbs and uneven surfaces safely.  6 weeks Initial Eval    07/06/2015   Increase L knee and gastroc strength to 5/5 to increase community ambulation to 30-45 minutes for necessary MD appts and grocery shopping  6 weeks Initial Eval      Signature: [name]. [place], PT, DPT [place] # [phone]  Date: 07/06/2015    Signature: Leveda Anna, MD ___________________________ Date:     [name]  MRN: [id number]

## 2015-07-12 NOTE — PT/OT Therapy Note (Signed)
Patient came to clinic and stated [facility] blood work per her MD. Due to her in sides not feeling right she states.  Patient states her BP has been fluctuating since surgery.  Took BP 148/67 HR 72 patient states this is high for her. Patient states she was dizzy this am and light headed with rest less legs.   Spoke to Lendon Ka DPT was advised patient needs clearance to return to therapy. Gave patient Lisa's card with fax number.

## 2015-07-12 NOTE — PT/OT Exercise Plan (Signed)
Name: Darlene Landry  Referring Physician: Leveda Anna, MD  Diagnosis:    ICD-10-CM    1. Acute pain of left knee M25.562    2. 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        Precautions:  Hx of SCI  Date of Surgery:    MD Follow-up: 701 due date:        Exercise Flow Sheet    Exercise Specifics 07/06/15  IE 07/10/15 07-30-15  NT see note      G-codes/PN     Combo    Combo x 6'  LMS            Heel slides   20 x with sliding board and strap  LMS  10x AROM  LMS            SAQ   Over can  30x  LMS          QS over towel     30 x 5"  LMS            SLR flex   QS with SLR flex  10x  AAROM by PT  LMS                                                                        Manual   See note  LMS          CP/MH PRN     NT          Home Exercise Program     Issue next visit  LMS          (Initials = supervised exercise by clinician)

## 2015-07-14 ENCOUNTER — Inpatient Hospital Stay: Payer: Medicare Other

## 2015-07-17 ENCOUNTER — Inpatient Hospital Stay: Payer: Medicare Other

## 2015-07-17 DIAGNOSIS — M25562 Pain in left knee: Secondary | ICD-10-CM

## 2015-07-17 DIAGNOSIS — Z96652 Presence of left artificial knee joint: Secondary | ICD-10-CM

## 2015-07-17 NOTE — PT/OT Therapy Note (Signed)
DAILY NOTE  07/17/2015    Time In/Out: 130 - 240 Total Treatment Time: 70 min Visit Number:  3    Certification Status Ends: 08/29/2015   G-Codes required on visit # 10    # of Authorized Visits: 16 Visit #: 3      Diagnosis (Treating/Medical):     ICD-10-CM    1. Acute pain of left knee M25.562    2. 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   Can sleep better now. Have been doing the HEP. Patient states she does not like the sheet to touch her knee due to pain.  Patient states she walks around the house bare foot.    Objective:   Treatment:  Therapeutic Exercise: to improve: Balance, Flexibility/ROM, Stabilization and Strength   Assisted Warm-up on the bike  Modifications/Patient Education: added leg press, badger, step up, SAQ,QD set, heel slide with ball   (Verbal, Visual and Tactile cues for transfers, and form with exercises)    TA advised patient to touch her left knee with different material her hand to desensitize the area, so she will be able to handle different texture touching the knee.  Gave patient information on shoe wear suggested [employer], for a foot and shoe analysis. Gave patient information on shoe laces for a shoe. Advised patient not a good idea to walk around the house bare foot.    Manual Therapy:   Stretch HS,KTC left knee  Patella mobs left knee grade 2  Massage left knee  A/P glide of the left knee grade 2      Current Measurements (ROM, Strength, Girth, Outcomes, etc.):      Range of Motion: (degrees)  Initial Right   AROM  InitialRight   PROM    Right  AROM    Right PROM  Knee  InitialLeft AROM  InitialLeft PROM  9/26  Left AROM    Left PROM    102        Flexion  80    95      0        Extension  -13     -10     (blank fields were intentionally left blank)      Modalities: Electrical  Stimulation with Ice: Premod 15 min. Location left knee Position Recumbant and Hot Pack 15 min. Location left HS Position Recumbant  Therapy Rationale: Decrease Pain, Decrease Inflammation and Increase Extensiblility       Assessment (response to treatment):   Patient has improved AROM of the left knee flexion of 15 degrees since the Initial evaluation, extension has improved by 3 degrees.  Patient apprehensive with new exercises but is able to complete the exercises. Patient is able to step up on a 6" step with her left LE and rail with out difficulty.  Patient advised at home to step up with the right LE for now until left becomes stronger.    Progress towards functional goals: 2nd visit    Patient requires continued skilled care to: improve left knee function    Plan:  Continue with Plan of Care    Cira Rue, [place]  [place] [address]  07/17/2015

## 2015-07-17 NOTE — PT/OT Exercise Plan (Signed)
Name: [name]  Referring Physician: Leveda Anna, MD  Diagnosis:    ICD-10-CM    1. Acute pain of left knee M25.562    2. 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        Precautions:  Hx of SCI  Date of Surgery:    MD Follow-up: 701 due date:        Exercise Flow Sheet    Exercise Specifics 07/06/15  IE 07/10/15 07/12/15  NT see note 08-01-2023     G-codes/PN     Combo    Combo x 6'  LMS  Combo  X 6  dp          Heel slides   20 x with sliding board and strap  LMS  10x AROM  LMS  Ball heel slide  X 20  dp          SAQ   Over can  30x  LMS  SAQ x 30 3 sec hold  dp        QS over towel     30 x 5"  LMS            SLR flex   QS with SLR flex  10x  AAROM by PT  LMS  SLR x 15  dp               Step up  6"  X 10  dp               LP 1 pl x 15  dp               Badger 1 pl x 15  dp                         Manual   See note  LMS  See note  dp        CP/MH PRN     NT  Ice/es /HP  dp        Home Exercise Program     Issue next visit  LMS          (Initials = supervised exercise by clinician)

## 2015-07-18 ENCOUNTER — Inpatient Hospital Stay: Payer: Medicare Other

## 2015-07-19 ENCOUNTER — Inpatient Hospital Stay: Payer: Medicare Other | Admitting: Rehabilitative and Restorative Service Providers"

## 2015-07-19 ENCOUNTER — Inpatient Hospital Stay: Payer: Medicare Other

## 2015-07-19 DIAGNOSIS — M25562 Pain in left knee: Secondary | ICD-10-CM

## 2015-07-19 DIAGNOSIS — Z96652 Presence of left artificial knee joint: Secondary | ICD-10-CM

## 2015-07-19 NOTE — PT/OT Therapy Note (Signed)
DAILY NOTE  07/19/2015    Time In/Out: 11:00 am - 11:43 am Total Treatment Time: 43 min Visit Number:  4    Certification Status Ends: 2015-09-08 G-Codes required on visit # 10    # of Authorized Visits: 16 Visit #: 4      Diagnosis (Treating/Medical):     ICD-10-CM    1. Acute pain of left knee M25.562    2. 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  Functional Changes: Pt reports bike hurts her knee.  Pt states she sleeps on a bed where her knees are bent.    Objective:   Treatment:  Therapeutic Exercise: to improve: Flexibility/ROM, Stabilization and Strength   Unassisted Warm-up on combo bike x 6 min  Modifications/Patient Education: on importance of not sleeping with knees bent (Verbal, Visual and Tactile cues to take her time with transitions)    NMR:  NT    Therapeutic Activities:  Education on sleeping and importance of keeping L knee straight when sleeping to decrease extension lag, pathology, use of towel and massage for desensitization of L knee lateral and along L leg. Pt ambulated in clinic x 30 feet with SPC and SBA.    Manual Therapy:   Stretch HS,KTC, gastroc left knee  Patella mobs left knee grade 2  Massage left knee  A/P glide of the left knee grade 2      Modalities: None     Assessment (response to treatment):   Pt SOB after getting into and out of LP machine and pt needed assistance with transfer to an from LP.  Pt advised to walk with 2WW when fatigued as pt need SBA.  Pt ambulated in clinic with SPC x 30 feet and SBA.  Pt needs assistance with lifting legs onto LP and onto ball during tx.  Pt and PT discussed importance of quad strength and sleeping with knee straight to regain motion and function.    Progress towards functional goals: progressing with strengthening exercises    Patient requires continued skilled care to: improve knee ROM, strength, balance and functional  mobility    Plan:  Continue with Plan of Care    Merilyn Baba PT, DPT [place] # [phone]  07/19/2015

## 2015-07-19 NOTE — PT/OT Exercise Plan (Signed)
Name: [name]  Referring Physician: Leveda Anna, MD  Diagnosis:    ICD-10-CM    1. Acute pain of left knee M25.562    2. 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        Precautions:  Hx of SCI  Date of Surgery:    MD Follow-up: 701 due date:        Exercise Flow Sheet    Exercise Specifics 07/06/15  IE 07/10/15 07/12/15  NT see note Jul 26, 2023 28-Jul-2015    G-codes/PN     Combo    Combo x 6'  LMS  Combo  X 6  dp Combo x 6 min  JB         Heel slides   20 x with sliding board and strap  LMS  10x AROM  LMS  Ball heel slide  X 20  dp Ball slide x 20  JB         SAQ   Over can  30x  LMS  SAQ x 30 3 sec hold  dp SAQ x 30  JB       QS over towel     30 x 5"  LMS   QD x 10 5" hold  JB         SLR flex   QS with SLR flex  10x  AAROM by PT  LMS  SLR x 15  dp SLR x 15  JB              Step up  6"  X 10  dp NT              LP 1 pl x 15  dp LP 1 pl x 15  See note  JB              Badger 1 pl x 15  dp NT                        Manual   See note  LMS  See note  dp See note  JB       CP/MH PRN     NT  Ice/es /HP  dp NT       Home Exercise Program     Issue next visit  LMS          (Initials = supervised exercise by clinician)

## 2015-07-21 ENCOUNTER — Inpatient Hospital Stay: Payer: Medicare Other

## 2015-07-21 DIAGNOSIS — Z96652 Presence of left artificial knee joint: Secondary | ICD-10-CM

## 2015-07-21 DIAGNOSIS — M25562 Pain in left knee: Secondary | ICD-10-CM

## 2015-07-21 NOTE — PT/OT Therapy Note (Signed)
DAILY NOTE  07/21/2015    Time In/Out: 1115 - 1200 Total Treatment Time: 45 min  Visit Number:  5    Certification Status Ends: 08/27/15   G-Codes required on visit # 10    # of Authorized Visits: 16 Visit #: 4      Diagnosis (Treating/Medical):     ICD-10-CM    1. Acute pain of left knee M25.562    2. 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  Functional Changes: have been trying to sleep with my knee straight at night    Objective:   Treatment:  Therapeutic Exercise: to improve: Balance, Flexibility/ROM, Stabilization and Strength   Assisted Warm-up on the bike  Modifications/Patient Education: added magnum, fitter   (Verbal, Visual and Tactile cues for balance)    NMR:   Rocker board  each direction and taps w/ vc & tactile cues to facilitate TrA, VMO/quad & glut recruitment & foot/ankle muscle co-contractions to decrease LOB during gait.      Manual Therapy:   Stretch left knee HS/QD  Patella mobs grade 2  A/P glide of femur and tibia grade 2      Current Measurements (ROM, Strength, Girth, Outcomes, etc.):      Girth/Edema: Other: See below     Initial R   R 9/30 Initial L  L 9/30   Mid Patellar  48.5  44 cm  50.3  46cm   (blank fields were intentionally left blank)     10 cm below mid patella R 41.5 cm  10 cm below left patella L  42.5 cm    Modalities: None  Therapy Rationale: Increase Extensiblility       Assessment (response to treatment):   Patient has edema in the left LE, advised patient to wear her ted stocking she received from hospital tomorrow am to decrease edema.  Patient is challenged with fitter needs one hand hold to feel stabile.  Tactile assist with fitter as well.      Progress towards functional goals: progressing towards goals    Patient requires continued skilled care to: improve left knee function    Plan:  Continue with Plan of Care    Darlene Landry, [place]  [place] [address]  07/21/2015

## 2015-07-21 NOTE — PT/OT Exercise Plan (Signed)
Name: Darlene Landry  Referring Physician: Leveda Anna, MD  Diagnosis:    ICD-10-CM    1. Acute pain of left knee M25.562    2. 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        Precautions:  Hx of SCI  Date of Surgery:    MD Follow-up: 701 due date:        Exercise Flow Sheet    Exercise Specifics 07/06/15  IE 07/10/15 07/12/15  NT see note 07/30/23 08-01-2015 Aug 03, 2023   G-codes/PN     Combo    Combo x 6'  LMS  Combo  X 6  dp Combo x 6 min  JB Combo  X 6  dp        Heel slides   20 x with sliding board and strap  LMS  10x AROM  LMS  Ball heel slide  X 20  dp Ball slide x 20  JB Ball slide x 20  dp        SAQ   Over can  30x  LMS  SAQ x 30 3 sec hold  dp SAQ x 30  JB SAQ x 30  dp      QS over towel     30 x 5"  LMS   QD x 10 5" hold  JB Fitter bal and taps  dp        SLR flex   QS with SLR flex  10x  AAROM by PT  LMS  SLR x 15  dp SLR x 15  JB SLR x 10  dp             Step up  6"  X 10  dp NT Step up  6" L x 15  dp             LP 1 pl x 15  dp LP 1 pl x 15  See note  JB nt             Badger 1 pl x 15  dp NT Badger 1 pl x 15  dp               Magnum 10# x 15  dp        Manual   See note  LMS  See note  dp See note  JB See note  dp      CP/MH PRN     NT  Ice/es /HP  dp NT       Home Exercise Program     Issue next visit  LMS          (Initials = supervised exercise by clinician)

## 2015-07-24 ENCOUNTER — Inpatient Hospital Stay: Payer: Medicare Other | Admitting: Rehabilitative and Restorative Service Providers"

## 2015-07-24 ENCOUNTER — Inpatient Hospital Stay: Payer: Medicare Other | Attending: Orthopaedic Surgery | Admitting: Rehabilitative and Restorative Service Providers"

## 2015-07-24 DIAGNOSIS — M25562 Pain in left knee: Secondary | ICD-10-CM | POA: Insufficient documentation

## 2015-07-24 DIAGNOSIS — Z471 Aftercare following joint replacement surgery: Secondary | ICD-10-CM | POA: Insufficient documentation

## 2015-07-24 DIAGNOSIS — Z96652 Presence of left artificial knee joint: Secondary | ICD-10-CM | POA: Insufficient documentation

## 2015-07-24 NOTE — PT/OT Therapy Note (Signed)
DAILY NOTE  07/24/2015    Time In/Out: 12:45 pm - 1:45 pm Total Treatment Time: 60 minutes Visit Number:  7    Certification Status Ends: 2015-09-16   G-Codes required on visit # 10    # of Authorized Visits: 16 Visit #: 6      Diagnosis (Treating/Medical):     ICD-10-CM    1. Acute pain of left knee M25.562    2. 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  Functional Changes: Patient reports she's been walking with her SPC in the house but brought it today to see if PT thought she was safe in community with it.    Objective:   Treatment:  Therapeutic Exercise: to improve: Balance, Flexibility/ROM, Stabilization and Strength   Assisted Warm-up on combo x 6'  Modifications/Patient Education: Initiated side stepping and step downs on 4" step; Initiated standing mini squats (Verbal, Visual and Tactile cues for new therex form)    NMR:  Fitter A/P and M/L taps followed by static balance and eccentric badger to increase quadriceps muscle activation and improve LE proprioception for safe ambulation on uneven surfaces with SPC assist    Therapeutic Activities:  Step activities and gait training in clinic with SPC to improve safety for home and community negotiation with reduced fall risk; Cues required for appropriate UE support      Manual Therapy:   Manual L gastroc and HS stretching  STM to L anterior knee  Grade III L patellar MOBS in all directions  Manual L knee flexion and extension stretching      Current Measurements (ROM, Strength, Girth, Outcomes, etc.):   NT    Modalities: Electrical Stimulation with Ice: Premod 10 min. Location L knee Position Recumbant and Other: pillow placed under L lower leg after 3'  Therapy Rationale: Decrease Pain, Decrease Spasm, Decrease Inflammation, Decrease Edema and Increase Extensiblility       Assessment (response to treatment):   Patient continues to have poor tolerance for knee extension  stretching and has knee flexion contracture.  She is unable to tolerate more than 3' of CP/ES unsupported before pillow must be placed under knee.  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  Patient states that her neuropathy was flaired up after UBE but isn't usually that bad.  She is educated on [place] crutches and patient declines needing.    Progress towards functional goals: NT    Patient requires continued skilled care to: Restore full L knee flexion/extension for normal gait pattern    Plan:  Continue with Plan of Care    [name]. [name], PT, DPT  # [phone]  07/24/2015

## 2015-07-24 NOTE — PT/OT Exercise Plan (Signed)
Name: Darlene Landry  Referring Physician: Leveda Anna, MD  Diagnosis:    ICD-10-CM    1. Acute pain of left knee M25.562    2. 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        Precautions:  Hx of SCI  Date of Surgery:    MD Follow-up: 701 due date:        Exercise Flow Sheet    Exercise Specifics 07/06/15  IE 07/10/15 07/12/15  NT see note 02-Aug-2023 08/04/15 2023/08/06 10/3  G-codes/PN     Combo    Combo x 6'  LMS  Combo  X 6  dp Combo x 6 min  JB Combo  X 6  dp Combo x 6'  LMS       Heel slides   20 x with sliding board and strap  LMS  10x AROM  LMS  Ball heel slide  X 20  dp Ball slide x 20  JB Ball slide x 20  dp NT       SAQ   Over can  30x  LMS  SAQ x 30 3 sec hold  dp SAQ x 30  JB SAQ x 30  dp NT     QS over towel     30 x 5"  LMS   QD x 10 5" hold  JB Fitter bal and taps  dp Fitter  A/P and M/L  10 taps followed by 1' balance  LMS       SLR flex   QS with SLR flex  10x  AAROM by PT  LMS  SLR x 15  dp SLR x 15  JB SLR x 10  dp NT            Step up  6"  X 10  dp NT Step up  6" L x 15  dp Step up  6"  L  15x  LMS  --------  L lateral step ups  4" step  X 10  LMS  Step up and over   4" step  5x  LMS            LP 1 pl x 15  dp LP 1 pl x 15  See note  JB nt Mini Squats at bar  2 x 10  LMS            Badger 1 pl x 15  dp NT Badger 1 pl x 15  dp Badger  1.5 pl  2 x 10  LMS  ------  Eccentric L badger  1 pl  10x  LMS              Magnum 10# x 15  dp Magnum  10#  2 x 10  LMS       Manual   See note  LMS  See note  dp See note  JB See note  dp See note  LMS     CP/MH PRN     NT  Ice/es /HP  dp NT  CP/ES post tx x 10'  LMS     Home Exercise Program     Issue next visit  LMS     Update next visit  LMS     (Initials = supervised exercise by clinician)

## 2015-07-27 ENCOUNTER — Inpatient Hospital Stay: Payer: Medicare Other | Admitting: Rehabilitative and Restorative Service Providers"

## 2015-07-27 DIAGNOSIS — M25562 Pain in left knee: Secondary | ICD-10-CM

## 2015-07-27 DIAGNOSIS — Z96652 Presence of left artificial knee joint: Secondary | ICD-10-CM

## 2015-07-27 NOTE — PT/OT Therapy Note (Signed)
DAILY NOTE  07/27/2015    Time In/Out: 2:15 pm - 3:05 pmTotal Treatment Time: 50 minutes Visit Number:  8    Certification Status Ends: 2015/09/12   G-Codes required on visit # 10    # of Authorized Visits: 16 Visit #: 7      Diagnosis (Treating/Medical):     ICD-10-CM    1. Acute pain of left knee M25.562    2. 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  Functional Changes: Patient reports she's been out walking a lot today for MD appts.  Patient presents to clinic today with new shoes with appropriate closed heel design.    Objective:   Treatment:  Therapeutic Exercise: to improve: Balance, Flexibility/ROM, Stabilization and Strength   Assisted Warm-up on combo x 6'  Modifications/Patient Education: Increased reps of badger and magnum; Initiated seated HS and knee flexion stretching (Verbal, Visual and Tactile cues for therex form); Advised patient to complete HS stretch in chair with ottoman at home    NMR:  Airex activities and eccentric badger to improve quadriceps control for safe ambulation on uneven surfaces using SPC.    Manual Therapy:   Manual L knee extension MOBS  Objective ROM measurements      Current Measurements (ROM, Strength, Girth, Outcomes, etc.):   ROM   Range of Motion: (degrees)  Initial Right     AROM   Knee   InitialLeft AROM   9/26   Left AROM   Left AROM  07/27/15    102   Flexion   80   95     105   0   Extension   -13    -10    -5   (blank fields were intentionally left blank)      Modalities: None  Therapy Rationale: Other: Deferred for home       Assessment (response to treatment):   Patient has fair balance with feet close to touching and can maintain balance for 1 minute with contact guard assist from PT.  Patient needs moderate assistance from PT for semi tandem with R foot in front of L foot.  She has anterior sway and is unable to sense loss of center of gravity.   Patient's L knee flexion and extension is improving.  Patient continues to report pain from knee down to ankle with knee extension (prolonged) stretching and is advised this is normal until full extension is achieved.  Recommended patient family assistance with knee extension stretching.    Progress towards functional goals: Improved L knee AROM    Patient requires continued skilled care to: normalize L knee strength for community ambulation with reduced fall risk    Plan:  Continue with Plan of Care    [name]. [name], PT, DPT [place] # [phone]  07/27/2015

## 2015-07-27 NOTE — PT/OT Exercise Plan (Signed)
Name: [name]  Referring Physician: Leveda Anna, MD  Diagnosis:    ICD-10-CM    1. Acute pain of left knee M25.562    2. 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        Precautions:  Hx of SCI  Date of Surgery:    MD Follow-up: 701 due date:        Exercise Flow Sheet    Exercise Specifics 07/06/15  IE 07/10/15 07/12/15  NT see note 9/26 08/01/15 August 03, 2023 10/3 08/09/15 G-codes/PN     Combo    Combo x 6'  LMS  Combo  X 6  dp Combo x 6 min  JB Combo  X 6  dp Combo x 6'  LMS Combo  6'  LMS      Heel slides   20 x with sliding board and strap  LMS  10x AROM  LMS  Ball heel slide  X 20  dp Ball slide x 20  JB Ball slide x 20  dp NT NT      SAQ   Over can  30x  LMS  SAQ x 30 3 sec hold  dp SAQ x 30  JB SAQ x 30  dp NT NT    QS over towel     30 x 5"  LMS   QD x 10 5" hold  JB Fitter bal and taps  dp Fitter  A/P and M/L  10 taps followed by 1' balance  LMS Seated HS stretch with heel on stool  3'  LMS  -------  Seated knee flexion bending  10 x 5"  LMS      SLR flex   QS with SLR flex  10x  AAROM by PT  LMS  SLR x 15  dp SLR x 15  JB SLR x 10  dp NT NT           Step up  6"  X 10  dp NT Step up  6" L x 15  dp Step up  6"  L  15x  LMS  --------  L lateral step ups  4" step  X 10  LMS  Step up and over   4" step  5x  LMS NT           LP 1 pl x 15  dp LP 1 pl x 15  See note  JB nt Mini Squats at bar  2 x 10  LMS Mini Squats at bar   2 x 10  LMS           Badger 1 pl x 15  dp NT Badger 1 pl x 15  dp Badger  1.5 pl  2 x 10  LMS  ------  Eccentric L badger  1 pl  10x  LMS Badger 1.5 pl  3 x 10  LMS  ------  Eccentric L badger  1 pl  10x  LMS             Magnum 10# x 15  dp Magnum  10#  2 x 10  LMS Magnum  10#  3 x 10  LMS             Standing  Marches on airex  2 x 10  LMS  -------  Airex feet close  1'  LMS  Semi tandem with R foot in front  30"  LMS  (see note)      Manual  See note  LMS  See note  dp See note  JB See note  dp See note  LMS See note  LMS    CP/MH PRN     NT  Ice/es /HP  dp NT  CP/ES post tx x  10'  LMS NT    Home Exercise Program     Issue next visit  LMS     Update next visit  LMS                  (Initials = supervised exercise by clinician)

## 2015-08-01 ENCOUNTER — Inpatient Hospital Stay: Payer: Medicare Other | Admitting: Rehabilitative and Restorative Service Providers"

## 2015-08-01 DIAGNOSIS — M25562 Pain in left knee: Secondary | ICD-10-CM

## 2015-08-01 DIAGNOSIS — Z96652 Presence of left artificial knee joint: Secondary | ICD-10-CM

## 2015-08-01 NOTE — PT/OT Therapy Note (Signed)
DAILY NOTE  08/01/2015    Time In/Out: 12:05 pm- 12:50 pm Total Treatment Time: 45 minutes Visit Number:  9    Certification Status Ends: 08-31-2015   G-Codes required on visit # 19    # of Authorized Visits: 16 Visit #: 8      Diagnosis (Treating/Medical):     ICD-10-CM    1. Acute pain of left knee M25.562    2. 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  Functional Changes: Patient states that she hasn't done any HEP since "Sunday because she did a lot of walking yesterday taking her husband to the [facility].  She has not done her HEP today.  She sees her surgeon this Thursday.    Objective:   Treatment:  Therapeutic Exercise: to improve: Flexibility/ROM, Stabilization and Strength   Assisted Warm-up on combo x 6'  Modifications/Patient Education: Reduced therex due to reassessment; Updated HEP and patient purchased resistance bands (Verbal, Visual and Tactile cues for new therex form)    Manual Therapy:   Manual L gastroc and HS stretching  Grade III patellar MOBS in all directions  Objective measures      Current Measurements (ROM, Strength, Girth, Outcomes, etc.):   Progress Update   Outcome Measure:                LEFS L Score: 28% (IE); 57.5% (10/11)              Pain Score: 50% (IE); 37% (10/11)              Rate Satisfaction with Current Function: 5/10 (IE); 8/10 (10/11)              HEP adherence:Most Days    Observation/Posture/Gait/Integumentary:  Observation of posture: Deficits noted: Forward Head and Rounded Shoulders  Ambulation: with Front wheel walker (IE); quad cane (10/11)  Functional Strength:   Sit to Stand:    UE Assist      Range of Motion: (degrees)  Initial Right       AROM   Knee    InitialLeft AROM    9/26     Left AROM    Left AROM   07/27/15    102    Flexion    80    95      10" 5    0    Extension    -13     -10     -5        Initial     R  10/11  R  LE Strength   MMT /5  Initial   L  10/11    L    4  4+  Hip Flexion  4  4+    5   5 Hip Abduction  5  5   4   4  Hip Adduction  4  4+   5-   5 Quadriceps  4+  5    5-   5 Hamstrings  4+   5-   5   5 Ankle Dorsiflexion  4+   5   5   5  Ankle Plantarflexion  5  5   (blank fields were intentionally left blank)    Girth/Edema: Other: See below       Initial R    R 9/30  Initial L  L 9/30    Mid Patellar   48.5   44 cm   50.3   46cm    (blank fields were intentionally left blank)      Patellar Mobility: hypomobile Direction: superior, inferior, medial and lateral (IE); Mild restrictions in superior/inferior directions (10/11)     Flexibility:     IE 08/01/15   Hamstrings  Restricted Left   Restricted left   Quadriceps  Restricted Left   Restricted left   Piriformis  NT   NT   ITBand  NT   NT   Iliopsoas  NT   NT   Gastroc  Restricted Left   WFL     Modalities: None  Therapy Rationale: Other: Patient deferred for home     Assessment (response to treatment):   Patient has been seen for 9 visits since [facility] and has made good progress.  She continues to have L knee ROM restrictions and has more difficulty with knee extension secondary to pain.  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  Patient's outcome measures reflect 30% improvement in functional abilities and 13% reduction in pain.     Progress towards functional goals: Improved outcome measures; improved L knee AROM; improved LE strength; reduced L LE edema; improved gastroc flexibility    Patient requires continued skilled care to: Improve ambulation endurance for community based activities    Plan:  Continue with Plan of Care    [name]. [name], PT, DPT [place] # [phone]  08/01/2015

## 2015-08-01 NOTE — PT/OT Exercise Plan (Signed)
Name: Darlene Landry  Referring Physician: Leveda Anna, MD  Diagnosis:    ICD-10-CM    1. Acute pain of left knee M25.562    2. 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        Precautions:  Hx of SCI  Date of Surgery:    MD Follow-up: 701 due date:        Exercise Flow Sheet    Exercise Specifics 07/06/15  IE 07/10/15 07/12/15  NT see note 9/26 07/19/15 9/30 10/3 07/27/15 08/01/15  gcodes     Combo    Combo x 6'  LMS  Combo  X 6  dp Combo x 6 min  JB Combo  X 6  dp Combo x 6'  LMS Combo  6'  LMS Combo x 6'     Heel slides   20 x with sliding board and strap  LMS  10x AROM  LMS  Ball heel slide  X 20  dp Ball slide x 20  JB Ball slide x 20  dp NT NT Ball slide knee flexion  20x  LMS     SAQ   Over can  30x  LMS  SAQ x 30 3 sec hold  dp SAQ x 30  JB SAQ x 30  dp NT NT NT   QS over towel     30 x 5"  LMS   QD x 10 5" hold  JB Fitter bal and taps  dp Fitter  A/P and M/L  10 taps followed by 1' balance  LMS Seated HS stretch with heel on stool  3'  LMS  -------  Seated knee flexion bending  10 x 5"  LMS Prolonged knee extension stretch with heel on can  2'  LMS  ------  QS  20 x 5"  LMS     SLR flex   QS with SLR flex  10x  AAROM by PT  LMS  SLR x 15  dp SLR x 15  JB SLR x 10  dp NT NT NT          Step up  6"  X 10  dp NT Step up  6" L x 15  dp Step up  6"  L  15x  LMS  --------  L lateral step ups  4" step  X 10  LMS  Step up and over   4" step  5x  LMS NT NT          LP 1 pl x 15  dp LP 1 pl x 15  See note  JB nt Mini Squats at bar  2 x 10  LMS Mini Squats at bar   2 x 10  LMS Mini squats at bar  20 x 3"  LMS  HR at bar  20 x 3"  LMS          Badger 1 pl x 15  dp NT Badger 1 pl x 15  dp Badger  1.5 pl  2 x 10  LMS  ------  Eccentric L badger  1 pl  10x  LMS Badger 1.5 pl  3 x 10  LMS  ------  Eccentric L badger  1 pl  10x  LMS Badger  1.5 pl  30x  LMS  ------  Eccentric L badger  1 pl  15x  LMS            Magnum 10# x 15  dp Magnum  10#  2 x 10  LMS Magnum  10#  3 x 10  LMS Magnum  10#  3 x 10  LMS             Standing  Marches on airex  2 x 10  LMS  -------  Airex feet close  1'  LMS  Semi tandem with R foot in front  30"  LMS  (see note) NT     Manual   See note  LMS  See note  dp See note  JB See note  dp See note  LMS See note  LMS See note  LMS   CP/MH PRN     NT  Ice/es /HP  dp NT  CP/ES post tx x 10'  LMS NT NT   Home Exercise Program     Issue next visit  LMS     Update next visit  LMS  Access Code: [email or web address]                   (Initials = supervised exercise by clinician)

## 2015-08-01 NOTE — PT/OT Plan of Care (Signed)
Progress Update IPTC Medicare Provider #: [phone]                [name]  MRN: [id number]  [facility] Medicare #: Medicare Sub. Num: [id number]  DOI: Onset of Problem / Injury: 06/19/15 Date of Surgery: 06/19/15 SOC: 07/06/2015     Diagnosis:     ICD-10-CM    1. Acute pain of left knee M25.562    2. 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 female presenting with L knee pain who requires Physical Therapy for the following:  Impairments: abnormal gait; reduced L knee AROM/strength; limited proprioception; reduced L patellar mobility; L LE muscle tightness    Functional Limitations: L knee pain; Abnormal ambulation with assistive device; patient reports she walks backwards up stairs to get into the house because it's easier.  Patient uses stair lift in house    Plan Of Care: Body Mechanics Education, NMR, Proprioceptive Activites, Statistician, Instruction in HEP, Therapeutic Exercise, Balance/Gait training, Soft Tissue/Joint Mobilization Grade I-IV L patellofemoral and tibiofemoral joint MOBS and Other: taping  Frequency/Duration: 3 times a week for 4 weeks followed 2 times a week for 2 weeks. Anticipated D/C date: 08/17/15    Certification Dates: From: 07/06/15  To: 08/17/15    Goals:   End of Certification   Date (Body Area, Impairment Goal, Functional   Activity, Target Performance) Time Frame Status Date/  Initial   07/06/2015   Patient will demonstrate independence in prescribed HEP with proper form, sets and reps for safe discharge to an independent program.  6 weeks Progressing  08/01/15  LMS   07/06/2015   Increase knee flexion AROM to 120 degrees to allow patient to safely negotiate stairs  reciprocally with railing.  6 weeks Progressing 08/01/15  LMS   07/06/2015   Increase knee extension AROM to 0 degrees to allow sleeping with <2 interruptions due to pain.  4 weeks Progressing 08/01/15  LMS   07/06/2015   Increase single leg stance to 10 seconds so patient can negotiate curbs and uneven surfaces safely.  6 weeks NT 08/01/15  LMS   07/06/2015   Increase L knee and gastroc strength to 5/5 to increase community ambulation to 30-45 minutes for necessary MD appts and grocery shopping  6 weeks Met 08/01/15  LMS     Signature: [name]. [place], PT, DPT [place] # [phone]  Date: 07/06/2015    Signature: Leveda Anna, MD ___________________________ Date:       End of Certification Status:   Service Dates:   From:   07/06/2015 To: 08/01/2015  Visits from [facility]: 9    Objective Status:    Objective:       Current Measurements (ROM, Strength, Girth, Outcomes, etc.):   Progress Update   Outcome Measure:                LEFS L Score: 28% (IE); 57.5% (10/11)              Pain Score: 50% (IE); 37% (10/11)              Rate Satisfaction with Current Function: 5/10 (IE); 8/10 (10/11)  HEP adherence:Most Days    Observation/Posture/Gait/Integumentary:  Observation of posture: Deficits noted: Forward Head and Rounded Shoulders  Ambulation: with Front wheel walker (IE); quad cane (10/11)  Functional Strength:   Sit to Stand:    UE Assist      Range of Motion: (degrees)  Initial Right       AROM   Knee    InitialLeft AROM    9/26     Left AROM    Left AROM   07/27/15    102    Flexion    80    95      105    0    Extension    -13     -10     -5        Initial     R  10/11  R  LE Strength   MMT /5  Initial   L  10/11   L    4  4+  Hip Flexion  4  4+    5   5 Hip Abduction  5  5   4   4  Hip Adduction  4  4+   5-   5 Quadriceps  4+  5    5-   5 Hamstrings  4+   5-   5   5 Ankle Dorsiflexion  4+   5   5   5  Ankle Plantarflexion  5  5   (blank fields were intentionally left blank)    Girth/Edema: Other: See below       Initial R     R 9/30  Initial L   L 9/30    Mid Patellar   48.5   44 cm   50.3   46cm    (blank fields were intentionally left blank)      Patellar Mobility: hypomobile Direction: superior, inferior, medial and lateral (IE); Mild restrictions in superior/inferior directions (10/11)     Flexibility:     IE 08/01/15   Hamstrings  Restricted Left   Restricted left   Quadriceps  Restricted Left   Restricted left   Piriformis  NT   NT   ITBand  NT   NT   Iliopsoas  NT   NT   Gastroc  Restricted Left   WFL      Assessment (response to treatment):   Patient has been seen for 9 visits since [facility] and has made good progress.  She continues to have L knee ROM restrictions and has more difficulty with knee extension secondary to pain.  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  Patient's outcome measures reflect 30% improvement in functional abilities and 13% reduction in pain.     Progress towards functional goals: Improved outcome measures; improved L knee AROM; improved LE strength; reduced L LE edema; improved gastroc flexibility    Patient requires continued skilled care to: Improve ambulation endurance for community based activities    Plan:  Continue with Plan of Care    G Codes:   Current: G8978 Mobility CJ (20-40),Goal: G8979 Mobility CJ (20-40) as determined by subjective reports, objective reassessment and outcome measures    Recommendations:   Continue with current POC    Signature: [name]. [place], PT, DPT [place] # [phone]  Date: 08/01/2015    Signature: Leveda Anna, MD ____________________ Date:       [name]  MRN: [id number]

## 2015-08-04 ENCOUNTER — Inpatient Hospital Stay: Payer: Medicare Other

## 2015-08-04 DIAGNOSIS — M25562 Pain in left knee: Secondary | ICD-10-CM

## 2015-08-04 DIAGNOSIS — Z96652 Presence of left artificial knee joint: Secondary | ICD-10-CM

## 2015-08-04 NOTE — PT/OT Exercise Plan (Signed)
Name: Darlene Landry  Referring Physician: Leveda Anna, MD  Diagnosis:    ICD-10-CM    1. Acute pain of left knee M25.562    2. 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        Precautions:  Hx of SCI  Date of Surgery:    MD Follow-up: 701 due date:        Exercise Flow Sheet    Exercise Specifics 07/06/15  IE 07/10/15 07/12/15  NT see note 9/26 07/19/15 9/30 10/3 07/27/15 08/01/15  gcodes 10/14       Combo    Combo x 6'  LMS  Combo  X 6  dp Combo x 6 min  JB Combo  X 6  dp Combo x 6'  LMS Combo  6'  LMS Combo x 6' Combo x 6  dp       Heel slides   20 x with sliding board and strap  LMS  10x AROM  LMS  Ball heel slide  X 20  dp Ball slide x 20  JB Ball slide x 20  dp NT NT Ball slide knee flexion  20x  LMS Ball heel slide x 20  dp       SAQ   Over can  30x  LMS  SAQ x 30 3 sec hold  dp SAQ x 30  JB SAQ x 30  dp NT NT NT SAQ x 30 3 sec hold  dp    QD set x 30 5 sec hold  dp     QS over towel     30 x 5"  LMS   QD x 10 5" hold  JB Fitter bal and taps  dp Fitter  A/P and M/L  10 taps followed by 1' balance  LMS Seated HS stretch with heel on stool  3'  LMS  -------  Seated knee flexion bending  10 x 5"  LMS Prolonged knee extension stretch with heel on can  2'  LMS  ------  QS  20 x 5"  LMS Prolonged stretch with heel on stool seated x 2 15 sec hold x 3  dp       SLR flex   QS with SLR flex  10x  AAROM by PT  LMS  SLR x 15  dp SLR x 15  JB SLR x 10  dp NT NT NT nt            Step up  6"  X 10  dp NT Step up  6" L x 15  dp Step up  6"  L  15x  LMS  --------  L lateral step ups  4" step  X 10  LMS  Step up and over   4" step  5x  LMS NT NT nt            LP 1 pl x 15  dp LP 1 pl x 15  See note  JB nt Mini Squats at bar  2 x 10  LMS Mini Squats at bar   2 x 10  LMS Mini squats at bar  20 x 3"  LMS  HR at bar  20 x 3"  LMS Mini squat x 20  dp            Badger 1 pl x 15  dp NT Badger 1 pl x 15  dp Badger  1.5 pl  2 x 10  LMS  ------  Eccentric L badger  1 pl  10x  LMS Badger 1.5 pl  3 x 10  LMS  ------  Eccentric L  badger  1 pl  10x  LMS Badger  1.5 pl  30x  LMS  ------  Eccentric L badger  1 pl  15x  LMS Badger 1.5 pl x 30  dp      Eccentric L badger 1 pl x 20  dp                    Magnum 10# x 15  dp Magnum  10#  2 x 10  LMS Magnum  10#  3 x 10  LMS Magnum  10#  3 x 10  LMS Magnum 15# x 30  dp              Standing  Marches on airex  2 x 10  LMS  -------  Airex feet close  1'  LMS  Semi tandem with R foot in front  30"  LMS  (see note) NT        Manual   See note  LMS  See note  dp See note  JB See note  dp See note  LMS See note  LMS See note  LMS See note  dp     CP/MH PRN     NT  Ice/es /HP  dp NT  CP/ES post tx x 10'  LMS NT NT declined     Home Exercise Program     Issue next visit  LMS     Update next visit  LMS  Access Code: [email or web address]                         (Initials = supervised exercise by clinician)

## 2015-08-04 NOTE — PT/OT Therapy Note (Signed)
DAILY NOTE  08/04/2015    Time In/Out: 1030 - 1115 Total Treatment Time: 45 min  Visit Number:  10    Certification Status Ends:09/11/2015   G-Codes required on visit # 19    # of Authorized Visits: 16 Visit #: 9      Diagnosis (Treating/Medical):     ICD-10-CM    1. Acute pain of left knee M25.562    2. 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  Functional Changes: patient states she saw the MD and he was pleased with progress, driving now.  Have a new pair of shoes and feel better.    Objective:   Treatment:  Therapeutic Exercise: to improve: Balance, Flexibility/ROM, Stabilization and Strength   Assisted Warm-up on the combo bike  Modifications/Patient Education: increased reps with exercises       Manual Therapy:   Stretch HS/QD   Patella mobs grade 2 and with mobilizer  A/P glide of femur grade 2      Current Measurements (ROM, Strength, Girth, Outcomes, etc.):    Left knee active flexion 115    Modalities: None  Therapy Rationale: Increase Extensiblility       Assessment (response to treatment):   Patient has improved AROM of the left knee since the last visit, by 10 degrees.  Patient has tight HS on the left with stretch and with SAQ.    Progress towards functional goals: see last note    Patient requires continued skilled care to: improve extension lag, strength    Plan:  Continue with Plan of Care G codes on last visit    Sinclair Grooms  [place] [address]  08/04/2015

## 2015-08-08 ENCOUNTER — Inpatient Hospital Stay: Payer: Medicare Other | Admitting: Rehabilitative and Restorative Service Providers"

## 2015-08-08 DIAGNOSIS — Z96652 Presence of left artificial knee joint: Secondary | ICD-10-CM

## 2015-08-08 DIAGNOSIS — M25562 Pain in left knee: Secondary | ICD-10-CM

## 2015-08-08 NOTE — PT/OT Exercise Plan (Signed)
Name: [name]  Referring Physician: Leveda Anna, MD  Diagnosis:    ICD-10-CM    1. Acute pain of left knee M25.562    2. 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        Precautions:  Hx of SCI  Date of Surgery:    MD Follow-up: 701 due date:        Exercise Flow Sheet    Exercise Specifics 07/06/15  IE 07/10/15 07/12/15  NT see note 9/26 07/19/15 9/30 10/3 07/27/15 08/01/15  gcodes 10/14 08/08/15      Combo    Combo x 6'  LMS  Combo  X 6  dp Combo x 6 min  JB Combo  X 6  dp Combo x 6'  LMS Combo  6'  LMS Combo x 6' Combo x 6  dp Combo x 6'  LMS      Heel slides   20 x with sliding board and strap  LMS  10x AROM  LMS  Ball heel slide  X 20  dp Ball slide x 20  JB Ball slide x 20  dp NT NT Ball slide knee flexion  20x  LMS Ball heel slide x 20  dp Bridges  10x  LMS      SAQ   Over can  30x  LMS  SAQ x 30 3 sec hold  dp SAQ x 30  JB SAQ x 30  dp NT NT NT SAQ x 30 3 sec hold  dp    QD set x 30 5 sec hold  dp NT    QS over towel     30 x 5"  LMS   QD x 10 5" hold  JB Fitter bal and taps  dp Fitter  A/P and M/L  10 taps followed by 1' balance  LMS Seated HS stretch with heel on stool  3'  LMS  -------  Seated knee flexion bending  10 x 5"  LMS Prolonged knee extension stretch with heel on can  2'  LMS  ------  QS  20 x 5"  LMS Prolonged stretch with heel on stool seated x 2 15 sec hold x 3  dp NT      SLR flex   QS with SLR flex  10x  AAROM by PT  LMS  SLR x 15  dp SLR x 15  JB SLR x 10  dp NT NT NT nt NT           Step up  6"  X 10  dp NT Step up  6" L x 15  dp Step up  6"  L  15x  LMS  --------  L lateral step ups  4" step  X 10  LMS  Step up and over   4" step  5x  LMS NT NT nt Step up and Lateral step up  6" step  15 x ea  LMS           LP 1 pl x 15  dp LP 1 pl x 15  See note  JB nt Mini Squats at bar  2 x 10  LMS Mini Squats at bar   2 x 10  LMS Mini squats at bar  20 x 3"  LMS  HR at bar  20 x 3"  LMS Mini squat x 20  dp Mini squts  30x  LMS           Badger  1 pl x 15  dp NT Badger 1 pl x 15  dp  Badger  1.5 pl  2 x 10  LMS  ------  Eccentric L badger  1 pl  10x  LMS Badger 1.5 pl  3 x 10  LMS  ------  Eccentric L badger  1 pl  10x  LMS Badger  1.5 pl  30x  LMS  ------  Eccentric L badger  1 pl  15x  LMS Badger 1.5 pl x 30  dp      Eccentric L badger 1 pl x 20  dp       Badger  1 pl  30x  LMS    Badger L eccentric 1 pl  15 x  LMS             Magnum 10# x 15  dp Magnum  10#  2 x 10  LMS Magnum  10#  3 x 10  LMS Magnum  10#  3 x 10  LMS Magnum 15# x 30  dp Magnum  15#  3 x 10  LMS             Standing  Marches on airex  2 x 10  LMS  -------  Airex feet close  1'  LMS  Semi tandem with R foot in front  30"  LMS  (see note) NT  NT      Manual   See note  LMS  See note  dp See note  JB See note  dp See note  LMS See note  LMS See note  LMS See note  dp See note  LMS    CP/MH PRN     NT  Ice/es /HP  dp NT  CP/ES post tx x 10'  LMS NT NT declined Declined    Home Exercise Program     Issue next visit  LMS     Update next visit  LMS  Access Code: [email or web address]                         (Initials = supervised exercise by clinician)

## 2015-08-10 ENCOUNTER — Inpatient Hospital Stay: Payer: Medicare Other

## 2015-08-10 DIAGNOSIS — M25562 Pain in left knee: Secondary | ICD-10-CM

## 2015-08-10 DIAGNOSIS — Z96652 Presence of left artificial knee joint: Secondary | ICD-10-CM

## 2015-08-10 NOTE — PT/OT Therapy Note (Signed)
DAILY NOTE  08/10/2015    Time In/Out: 100 - 155 pm Total Treatment Time: 40' Visit Number:  11    Certification Status Ends: 09-14-15   G-Codes required on visit # 19    # of Authorized Visits: 16 Visit #: 11      Diagnosis (Treating/Medical):     ICD-10-CM    1. Acute pain of left knee M25.562    2. 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 Doing fairly well.      Objective:   Treatment:  Therapeutic Exercise: to improve: Balance, Flexibility/ROM, Stabilization and Strength   Assisted Warm-up on combo bike x 6' with subjective  Modifications/Patient Education: resumed most exercises.       Manual Therapy:   L patella mobs  L gastroc stretching   L knee flexion stretching  S.B.A. With forward and lateral step ups on gym stairs.        Current Measurements (ROM, Strength, Girth, Outcomes, etc.):      Measurements taken last visit.    Modalities: None.  Declined.  Therapy Rationale: Other: None       Assessment (response to treatment):   Challenged with forward and lateral step ups on gym stairs, especially lateral.  Quick to fatigue with both, but more with lateral.    Patient requires continued skilled care to: increase in L LE for improved and safer gait.    Plan:  Continue with Plan of Care    Claria Dice, LPTA  08/10/2015

## 2015-08-10 NOTE — PT/OT Exercise Plan (Signed)
Name: [name]  Referring Physician: Leveda Anna, MD  Diagnosis:    ICD-10-CM    1. Acute pain of left knee M25.562    2. 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        Precautions:  Hx of SCI  Date of Surgery:    MD Follow-up: 701 due date:        Exercise Flow Sheet    Exercise Specifics 07/06/15  IE 07/10/15 07/12/15  NT see note 9/26 07/19/15 9/30 10/3 07/27/15 08/01/15  gcodes 10/14 08/10/15 08/10/15     Combo    Combo x 6'  LMS  Combo  X 6  dp Combo x 6 min  JB Combo  X 6  dp Combo x 6'  LMS Combo  6'  LMS Combo x 6' Combo x 6  dp  6'  AD     Heel slides   20 x with sliding board and strap  LMS  10x AROM  LMS  Ball heel slide  X 20  dp Ball slide x 20  JB Ball slide x 20  dp NT NT Ball slide knee flexion  20x  LMS Ball heel slide x 20  dp  Reclined 20x AD     SAQ   Over can  30x  LMS  SAQ x 30 3 sec hold  dp SAQ x 30  JB SAQ x 30  dp NT NT NT SAQ x 30 3 sec hold  dp    QD set x 30 5 sec hold  dp  30x AD            5 sec. 30x AD   QS over towel     30 x 5"  LMS   QD x 10 5" hold  JB Fitter bal and taps  dp Fitter  A/P and M/L  10 taps followed by 1' balance  LMS Seated HS stretch with heel on stool  3'  LMS  -------  Seated knee flexion bending  10 x 5"  LMS Prolonged knee extension stretch with heel on can  2'  LMS  ------  QS  20 x 5"  LMS Prolonged stretch with heel on stool seated x 2 15 sec hold x 3  dp  NT     SLR flex   QS with SLR flex  10x  AAROM by PT  LMS  SLR x 15  dp SLR x 15  JB SLR x 10  dp NT NT NT nt  NT          Step up  6"  X 10  dp NT Step up  6" L x 15  dp Step up  6"  L  15x  LMS  --------  L lateral step ups  4" step  X 10  LMS  Step up and over   4" step  5x  LMS NT NT Step up and Lateral step up  6" step  15x ea  LMS Gym step 15x forward and 10x lateral AD Gym step 15x forward and 10x lateral S.B.A.  AD          LP 1 pl x 15  dp LP 1 pl x 15  See note  JB nt Mini Squats at bar  2 x 10  LMS Mini Squats at bar   2 x 10  LMS Mini squats at bar  20 x 3"  LMS  HR at bar  20  x 3"  LMS Mini squat x 20  dp Mini squts  NT          Badger 1 pl x 15  dp NT Badger 1 pl x 15  dp Badger  1.5 pl  2 x 10  LMS  ------  Eccentric L badger  1 pl  10x  LMS Badger 1.5 pl  3 x 10  LMS  ------  Eccentric L badger  1 pl  10x  LMS Badger  1.5 pl  30x  LMS  ------  Eccentric L badger  1 pl  15x  LMS Badger 1.5 pl x 30  dp      Eccentric L badger 1 pl x 20  dp       Badger  1 pl  30x  LMS    Badger L eccentric 1 pl  15 x  LMS 1pl. B 2x10 AD    1pl. B Conc. And L eccentric 10x AD            Magnum 10# x 15  dp Magnum  10#  2 x 10  LMS Magnum  10#  3 x 10  LMS Magnum  10#  3 x 10  LMS Magnum 15# x 30  dp Magnum  15#  3 x 10  LMS 15# 3x10 AD            Standing  Marches on airex  2 x 10  LMS  -------  Airex feet close  1'  LMS  Semi tandem with R foot in front  30"  LMS  (see note) NT   NT                NT     Manual   See note  LMS  See note  dp See note  JB See note  dp See note  LMS See note  LMS See note  LMS See note  dp  See note AD   CP/MH PRN     NT  Ice/es /HP  dp NT  CP/ES post tx x 10'  LMS NT NT declined  Declined  AD   Home Exercise Program     Issue next visit  LMS     Update next visit  LMS  Access Code: [email or web address]                         (Initials = supervised exercise by clinician)

## 2015-08-11 NOTE — PT/OT Therapy Note (Signed)
DAILY NOTE  08/08/2015    Time In/Out: 12:00 - 12:45 pm Total Treatment Time: 45 minutes Visit Number:  11    Certification Status Ends: 08/23/2015   G-Codes required on visit # 19    # of Authorized Visits: 16 Visit #: 11      Diagnosis (Treating/Medical):     ICD-10-CM    1. Acute pain of left knee M25.562    2. 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 She reports she feels more stiff today.  She did her HEP but didn't stretch before PT today.  Functional Changes: Patient states that shes sore today and tired.  Her husband states that they did a big shopping trip (2 hrs) and she cooked dinner last night.    Objective:   Treatment:  Therapeutic Exercise: to improve: Flexibility/ROM, Stabilization and Strength   Assisted Warm-up on combo x 6'  Modifications/Patient Education: Initiated bridges Psychiatrist, Visual and Tactile cues for therex form)    Therapeutic Activities:  Step up activities to improve quad facilitation and to increase patient confidence with stair and curb negotiation; Educated on importance of energy conservation with daily activities to prevent overexhaustion and knee pain    Manual Therapy:   STM to L knee  Grade III L patellar MOBS  Gentle L knee flexion stretching        Modalities: None  Therapy Rationale: Other: Patient deferred       Assessment (response to treatment):   Patient is fatigued today and requires increased rest break during today's activities.  She is most challenged with lateral step ups and reports shoulder pain due to increase UE effort.  She becomes fatigued and requires moderate assist from PT to move from stairs to chair after lateral step up completion.  She is educated on importance of energy conservation and gradual resumption of activities to prevent exhaustion and pain.    Progress towards functional goals: NT    Patient requires continued skilled care to: improve quadriceps  control for improved walking endurance and curb negotiation    Plan:  Continue with Plan of Care    [name]. [name], PT, DPT [place] # [phone]  08/11/2015

## 2015-08-15 ENCOUNTER — Inpatient Hospital Stay: Payer: Medicare Other

## 2015-08-15 DIAGNOSIS — Z96652 Presence of left artificial knee joint: Secondary | ICD-10-CM

## 2015-08-15 DIAGNOSIS — M25562 Pain in left knee: Secondary | ICD-10-CM

## 2015-08-15 NOTE — PT/OT Exercise Plan (Signed)
Name: Darlene Landry  Referring Physician: Leveda Anna, MD  Diagnosis:    ICD-10-CM    1. Acute pain of left knee M25.562    2. 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        Precautions:  Hx of SCI  Date of Surgery:    MD Follow-up: 701 due date:        Exercise Flow Sheet    Exercise Specifics 07/06/15  IE 07/10/15 07/12/15  NT see note 9/26 07/19/15 9/30 10/3 07/27/15 08/01/15  gcodes 10/14 08/10/15 08/10/15 September 09, 2023       Combo    Combo x 6'  LMS  Combo  X 6  dp Combo x 6 min  JB Combo  X 6  dp Combo x 6'  LMS Combo  6'  LMS Combo x 6' Combo x 6  dp  6'  AD 6  dp       Heel slides   20 x with sliding board and strap  LMS  10x AROM  LMS  Ball heel slide  X 20  dp Ball slide x 20  JB Ball slide x 20  dp NT NT Ball slide knee flexion  20x  LMS Ball heel slide x 20  dp  Reclined 20x AD        SAQ   Over can  30x  LMS  SAQ x 30 3 sec hold  dp SAQ x 30  JB SAQ x 30  dp NT NT NT SAQ x 30 3 sec hold  dp    QD set x 30 5 sec hold  dp  30x AD            5 sec. 30x AD SAQ x 30 3 sec hold  dp     QS over towel     30 x 5"  LMS   QD x 10 5" hold  JB Fitter bal and taps  dp Fitter  A/P and M/L  10 taps followed by 1' balance  LMS Seated HS stretch with heel on stool  3'  LMS  -------  Seated knee flexion bending  10 x 5"  LMS Prolonged knee extension stretch with heel on can  2'  LMS  ------  QS  20 x 5"  LMS Prolonged stretch with heel on stool seated x 2 15 sec hold x 3  dp  NT        SLR flex   QS with SLR flex  10x  AAROM by PT  LMS  SLR x 15  dp SLR x 15  JB SLR x 10  dp NT NT NT nt  NT             Step up  6"  X 10  dp NT Step up  6" L x 15  dp Step up  6"  L  15x  LMS  --------  L lateral step ups  4" step  X 10  LMS  Step up and over   4" step  5x  LMS NT NT Step up and Lateral step up  6" step  15x ea  LMS Gym step 15x forward and 10x lateral AD Gym step 15x forward and 10x lateral S.B.A.  AD             LP 1 pl x 15  dp LP 1 pl x 15  See note  JB nt Mini Squats at bar  2 x  10  LMS Mini Squats at bar   2 x  10  LMS Mini squats at bar  20 x 3"  LMS  HR at bar  20 x 3"  LMS Mini squat x 20  dp Mini squts  NT             Badger 1 pl x 15  dp NT Badger 1 pl x 15  dp Badger  1.5 pl  2 x 10  LMS  ------  Eccentric L badger  1 pl  10x  LMS Badger 1.5 pl  3 x 10  LMS  ------  Eccentric L badger  1 pl  10x  LMS Badger  1.5 pl  30x  LMS  ------  Eccentric L badger  1 pl  15x  LMS Badger 1.5 pl x 30  dp      Eccentric L badger 1 pl x 20  dp       Badger  1 pl  30x  LMS    Badger L eccentric 1 pl  15 x  LMS 1pl. B 2x10 AD    1pl. B Conc. And L eccentric 10x AD Badger 1 pl x 20  dp    1 pl up with both and eccentric control R  X 15  dp              Magnum 10# x 15  dp Magnum  10#  2 x 10  LMS Magnum  10#  3 x 10  LMS Magnum  10#  3 x 10  LMS Magnum 15# x 30  dp Magnum  15#  3 x 10  LMS 15# 3x10 AD Magnum 15# x 30  dp              Standing  Marches on airex  2 x 10  LMS  -------  Airex feet close  1'  LMS  Semi tandem with R foot in front    30"  LMS  (see note) NT   NT                NT Side stepping x 2 laps // bar  Dp    // bar SLR, hip abd, ext right LE and left hold x 10 ea  dp  YTB TKE standing x 10 10 sec hold  dp       Manual   See note  LMS  See note  dp See note  JB See note  dp See note  LMS See note  LMS See note  LMS See note  dp  See note AD See note  dp     CP/MH PRN     NT  Ice/es /HP  dp NT  CP/ES post tx x 10'  LMS NT NT declined  Declined  AD Ice/es  dp     Home Exercise Program     Issue next visit  LMS     Update next visit  LMS  Access Code: [email or web address]       Issued TKE with ytb  dp                        (Initials = supervised exercise by clinician)

## 2015-08-15 NOTE — PT/OT Therapy Note (Signed)
DAILY NOTE  08/15/2015    Time In/Out: 1230 - 130 Total Treatment Time: 60 min Visit Number:  12    Certification Status Ends: 2015-08-19   G-Codes required on visit # 19    # of Authorized Visits: 16 Visit #: 11      Diagnosis (Treating/Medical):     ICD-10-CM    1. Acute pain of left knee M25.562    2. 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  Functional Changes: have started to drive and doing ok. Saw the MD he is pleased with progress  Did a lot of riding th bike yesterday at home was sore some yesterday    Objective:   Treatment:  Therapeutic Exercise: to improve: Balance, Flexibility/ROM, Stabilization and Strength   Assisted Warm-up on the combo bike  Modifications/Patient Education: instructed in YTB TKE, // bar side stepping, SLR, hip abd, ext standing right move and left hold   (Verbal, Visual and Tactile cues for form)    NMR:   hip 4 way 10 each direction (hip ext, abd, flex, ) w/verbal & tactile cues to facilitate glut med & max recruitment, maintain knee ext of moving LE, maintain slightly flexed stance knee & avoid trunk lean for improved stability during gait.        Manual Therapy:   Stretch left HS/QD  Patella mobs grade 2      Current Measurements (ROM, Strength, Girth, Outcomes, etc.):    Left knee AROM flexion heel slide 115    Modalities: Electrical Stimulation with Ice: Premod 15 min. Location left knee Position Recumbant  Therapy Rationale: Decrease Pain, Decrease Inflammation and Increase Extensiblility       Assessment (response to treatment):   Patient completed all the exercises with out increase pain. Challenged with side stepping with out holding onto rail.  Patient has maintained AROM of 110 degrees for the past few visits.      Progress towards functional goals: progressing towards goal 2    Patient requires continued skilled care to: improve left knee strength and decrease extension lag    Plan:  Continue  with Plan of Care    Cira Rue, [place]  [facility] [address]  08/15/2015

## 2015-08-17 ENCOUNTER — Inpatient Hospital Stay: Payer: Medicare Other

## 2015-08-17 DIAGNOSIS — M25562 Pain in left knee: Secondary | ICD-10-CM

## 2015-08-17 DIAGNOSIS — Z96652 Presence of left artificial knee joint: Secondary | ICD-10-CM

## 2015-08-17 NOTE — PT/OT Therapy Note (Signed)
DAILY NOTE  2015/08/26    Time In/Out: 1245 - 140 Total Treatment Time: 55 min 45 min rx and 10 min ice   Visit Number:  13    Certification Status Ends: 08/25/05   G-Codes required on visit # 19    # of Authorized Visits: 16 Visit #: 13      Diagnosis (Treating/Medical):     ICD-10-CM    1. Acute pain of left knee M25.562    2. 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  Functional Changes: having difficulty sleeping not sure from what. Patient stated she has been standing on her Left a lot more than usual.    Objective:   Treatment:  Therapeutic Exercise: to improve: Balance, Flexibility/ROM, Stabilization and Strength   Assisted Warm-up on the combo bike  Modifications/Patient Education: added CC walk out, and CC TKE   (Verbal, Visual and Tactile cues for CC exercises)    NMR:   hip 4 way 10# x 2 each direction (hip ext, abd, flex, add) w/verbal & tactile cues to facilitate glut med & max recruitment, maintain knee ext of moving LE, maintain slightly flexed stance knee & avoid trunk lean for improved stability during gait.          Manual Therapy:   Stretch left HS/QD  Patella mobs manual and with mobilizers  See measurements      Current Measurements (ROM, Strength, Girth, Outcomes, etc.):    Outcome Measure:                LEFS L Score: 28% (IE); 57.5% (10/11)                                              LEFS  56%              2023-08-26              Pain Score: 50% (IE); 37% (10/11)                                                      PSFS    30%            08-26-2023              Rate Satisfaction with Current Function: 5/10 (IE); 8/10 (10/11)       Function  8/10            08-26-23              HEP adherence:Most Days    Observation/Posture/Gait/Integumentary:  Observation of posture: Deficits noted: Forward Head and Rounded Shoulders  Ambulation: with Front wheel walker (IE); quad cane (10/11)  Cane 2023-08-26  Functional Strength:   Sit to Stand:     UE Assist      Range of Motion: (degrees)  Initial Right         AROM   Knee     InitialLeft AROM     9/26       Left AROM     Left AROM  07/27/15  L AROM  10/27   102     Flexion     80     95       105   110   0     Extension     -13      -10      -5   -3         Initial       R   10/11   R   10/27  R LE Strength     MMT /5   Initial     L   10/11    L   10/27 L   4   4+   4+ Hip Flexion   4   4+   4+   5    5  5  Hip Abduction   5   5  5   4    4  5  Hip Adduction   4   4+  5   5-    5  5 Quadriceps   4+   5   5   5-    5  5 Hamstrings   4+    5-  5   5    5  5  Ankle Dorsiflexion   4+    5  5   5    5  5  Ankle Plantarflexion   5   5  5    (blank fields were intentionally left blank)    Girth/Edema: Other: See below        Initial R    R 10/27  R 9/30   Initial L    L 9/30   L 08/17/15   Mid Patellar    48.5     44 cm    50.3    46cm   45cm   (blank fields were intentionally left blank)      Modalities: Ice Pack 10 min. Location left knee Position Recumbant  Therapy Rationale: Decrease Pain, Decrease Inflammation and Increase Extensiblility       Assessment (response to treatment):   Patient has increase edema in the right knee since the walking quite a bit the past two days.  Patient lost some AROM of the knee since last visit by 5 degrees due to edema.  Patient has improved strength in the left knee with the exception of hip flexion.  Patient has improved outcomes since the initial evaluation.    Progress towards functional goals    End of Certification    Date  (Body Area, Impairment Goal, Functional     Activity, Target Performance)  Time Frame  Status  Date/   Initial    07/06/2015   Patient will demonstrate independence in prescribed HEP with proper form, sets and reps for safe discharge to an independent program.   6 weeks  Progressing  08/01/15   LMS    07/06/2015   Increase knee flexion AROM to 120 degrees to allow patient to safely negotiate stairs reciprocally with railing.   6 weeks  Progressing   08/01/15   LMS    07/06/2015   Increase knee extension AROM to 0 degrees to allow sleeping with <2 interruptions due to pain.   4 weeks  Progressing  08/01/15   LMS    07/06/2015   Increase single leg stance to 10 seconds so patient can negotiate curbs and uneven surfaces safely.   6 weeks  NT  08/01/15  LMS    07/06/2015   Increase L knee and gastroc strength to 5/5 to increase community ambulation to 30-45 minutes for necessary MD appts and grocery shopping   6 weeks  Met  08/01/15   LMS          Patient requires continued skilled care to: improve ROM and strength    Plan:  Continue with Plan of Care    Cira Rue, [place]  [place] 145  08/17/2015

## 2015-08-17 NOTE — PT/OT Exercise Plan (Signed)
Name: Darlene Landry  Referring Physician: Leveda Anna, MD  Diagnosis:    ICD-10-CM    1. Acute pain of left knee M25.562    2. 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        Precautions:  Hx of SCI  Date of Surgery:    MD Follow-up: 701 due date:        Exercise Flow Sheet    Exercise Specifics 07/06/15  IE 07/10/15 07/12/15  NT see note 9/26 07/19/15 9/30 10/3 07/27/15 08/01/15  gcodes 10/14 08/10/15 08/10/15 10/25 10/27       Combo    Combo x 6'  LMS  Combo  X 6  dp Combo x 6 min  JB Combo  X 6  dp Combo x 6'  LMS Combo  6'  LMS Combo x 6' Combo x 6  dp  6'  AD 6  dp 6  dp      Heel slides   20 x with sliding board and strap  LMS  10x AROM  LMS  Ball heel slide  X 20  dp Ball slide x 20  JB Ball slide x 20  dp NT NT Ball slide knee flexion  20x  LMS Ball heel slide x 20  dp  Reclined 20x AD  home      SAQ   Over can  30x  LMS  SAQ x 30 3 sec hold  dp SAQ x 30  JB SAQ x 30  dp NT NT NT SAQ x 30 3 sec hold  dp    QD set x 30 5 sec hold  dp  30x AD            5 sec. 30x AD SAQ x 30 3 sec hold  dp home    QS over towel     30 x 5"  LMS   QD x 10 5" hold  JB Fitter bal and taps  dp Fitter  A/P and M/L  10 taps followed by 1' balance  LMS Seated HS stretch with heel on stool  3'  LMS  -------  Seated knee flexion bending  10 x 5"  LMS Prolonged knee extension stretch with heel on can  2'  LMS  ------  QS  20 x 5"  LMS Prolonged stretch with heel on stool seated x 2 15 sec hold x 3  dp  NT        SLR flex   QS with SLR flex  10x  AAROM by PT  LMS  SLR x 15  dp SLR x 15  JB SLR x 10  dp NT NT NT nt  NT             Step up  6"  X 10  dp NT Step up  6" L x 15  dp Step up  6"  L  15x  LMS  --------  L lateral step ups  4" step  X 10  LMS  Step up and over   4" step  5x  LMS NT NT Step up and Lateral step up  6" step  15x ea  LMS Gym step 15x forward and 10x lateral AD Gym step 15x forward and 10x lateral S.B.A.  AD             LP 1 pl x 15  dp LP 1 pl x 15  See note  JB nt Mini Squats at bar  2 x 10  LMS Mini  Squats at bar   2 x 10  LMS Mini squats at bar  20 x 3"  LMS  HR at bar  20 x 3"  LMS Mini squat x 20  dp Mini squts  NT  CC walk out 10# x 2 ea  dp    CC TKE 10# x 20 5 sec hold  dp           Badger 1 pl x 15  dp NT Badger 1 pl x 15  dp Badger  1.5 pl  2 x 10  LMS  ------  Eccentric L badger  1 pl  10x  LMS Badger 1.5 pl  3 x 10  LMS  ------  Eccentric L badger  1 pl  10x  LMS Badger  1.5 pl  30x  LMS  ------  Eccentric L badger  1 pl  15x  LMS Badger 1.5 pl x 30  dp      Eccentric L badger 1 pl x 20  dp       Badger  1 pl  30x  LMS    Badger L eccentric 1 pl  15 x  LMS 1pl. B 2x10 AD    1pl. B Conc. And L eccentric 10x AD Badger 1 pl x 20  dp    1 pl up with both and eccentric control L  X 15  dp Badger 1.5 pl x 30  dp    1 pl up with both and eccentric control   L    X 20  dp             Magnum 10# x 15  dp Magnum  10#  2 x 10  LMS Magnum  10#  3 x 10  LMS Magnum  10#  3 x 10  LMS Magnum 15# x 30  dp Magnum  15#  3 x 10  LMS 15# 3x10 AD Magnum 15# x 30  dp Magnum 20# x 10  dp             Standing  Marches on airex  2 x 10  LMS  -------  Airex feet close  1'  LMS  Semi tandem with R foot in front    30"  LMS  (see note) NT   NT                NT Side stepping x 2 laps // bar  Dp    // bar SLR, hip abd, ext right LE and left hold x 10 ea  dp  YTB TKE standing x 10 10 sec hold  dp nt      Manual   See note  LMS  See note  dp See note  JB See note  dp See note  LMS See note  LMS See note  LMS See note  dp  See note AD See note  dp     CP/MH PRN     NT  Ice/es /HP  dp NT  CP/ES post tx x 10'  LMS NT NT declined  Declined  AD Ice/es  dp Ice only  dp    Home Exercise Program     Issue next visit  LMS     Update next visit  LMS  Access Code: [email or web address]       Issued TKE with ytb  dp                        (  Initials = supervised exercise by clinician)

## 2015-08-21 NOTE — PT/OT Plan of Care (Signed)
Updated POC IPTC Medicare Provider #: [id number]                [name]  MRN: [id number]  [facility] Medicare #: Medicare Sub. Num: [id number]  DOI: Onset of Problem / Injury: 06/19/15 Date of Surgery: 06/19/15 SOC: 07/06/2015     Diagnosis:     ICD-10-CM    1. Acute pain of left knee M25.562    2. 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 female presenting with L knee pain who requires Physical Therapy for the following:  Impairments: abnormal gait; reduced L knee AROM/strength; limited proprioception; reduced L patellar mobility; L LE muscle tightness    Functional Limitations: L knee pain; Abnormal ambulation with assistive device; patient reports she walks backwards up stairs to get into the house because it's easier.  Patient uses stair lift in house    Plan Of Care: Body Mechanics Education, NMR, Proprioceptive Activites, Statistician, Instruction in HEP, Therapeutic Exercise, Balance/Gait training, Soft Tissue/Joint Mobilization Grade I-IV L patellofemoral and tibiofemoral joint MOBS and Other: taping  Frequency/Duration: 3 times a week for 4 weeks followed 2 times a week for 4 weeks. Anticipated D/C date: 08/31/15    Certification Dates: From: 07/06/15  To: 08/31/15    Goals:   End of Certification   Date (Body Area, Impairment Goal, Functional   Activity, Target Performance) Time Frame Status Date/  Initial   07/06/2015   Patient will demonstrate independence in prescribed HEP with proper form, sets and reps for safe discharge to an independent program.  8 weeks Progressing  08/01/15  LMS   07/06/2015   Increase knee flexion AROM to 120 degrees to allow patient to safely negotiate stairs reciprocally  with railing.  8 weeks Progressing 08/01/15  LMS   07/06/2015   Increase knee extension AROM to 0 degrees to allow sleeping with <2 interruptions due to pain.  8 weeks Progressing 08/01/15  LMS   07/06/2015   Increase single leg stance to 10 seconds so patient can negotiate curbs and uneven surfaces safely.  8 weeks NT 08/01/15  LMS   07/06/2015   Increase L knee and gastroc strength to 5/5 to increase community ambulation to 30-45 minutes for necessary MD appts and grocery shopping  8 weeks Met 08/01/15  LMS     Signature: [name]. [place], PT, DPT [place] # [phone]  Date: 07/06/2015    Signature: Leveda Anna, MD ___________________________ Date:         End of Certification Status:   Service Dates:   From:   07/06/15 To: 08/17/2015  Visits from [facility]: 13    Objective Status:    Subjective: Emmelina's pain is Intermittent and is rated a 2/10.  Functional Changes: having difficulty sleeping not sure from what. Patient stated she has been standing on her Left a lot more than usual.    Objective:  Current Measurements (ROM, Strength, Girth, Outcomes, etc.):   Outcome Measure:    LEFS L Score: 28% (IE); 57.5% (10/11) LEFS 56%  10/27   Pain Score: 50% (IE); 37% (10/11) PSFS 30% 10/27   Rate Satisfaction with Current Function: 5/10(IE); 8/10 (10/11) Function 8/10 10/27  HEP adherence:Most Days    Observation/Posture/Gait/Integumentary:  Observation of posture: Deficits noted: Forward Head and Rounded Shoulders  Ambulation: with Front wheel walker (IE); quad cane (10/11) Cane 10/27  Functional Strength:   Sit to Stand:UE Assist    Range of Motion: (degrees)  Initial Right      AROM Knee InitialLeft AROM 9/26     Left AROM Left AROM    07/27/15 L AROM  10/27    102 Flexion 80 95 105 110   0 Extension -13 -10 -5 -3       Initial     R 10/11   R 10/27  R LE Strength    MMT /5 Initial    L 10/11   L 10/27 L   4 4+ 4+ Hip Flexion 4 4+ 4+   5 5 5  Hip Abduction 5 5 5   4 4 5  Hip Adduction 4 4+ 5   5- 5 5 Quadriceps 4+ 5 5   5- 5 5 Hamstrings 4+ 5- 5   5 5 5  Ankle Dorsiflexion 4+ 5 5   5 5 5  Ankle Plantarflexion 5 5 5    (blank fields were intentionally left blank)    Girth/Edema: Other: See below    Initial R R 10/27 R 9/30 Initial L L 9/30 L 08/17/15   Mid Patellar 48.5  44 cm 50.3 46cm 45cm   (blank fields were intentionally left blank)    Assessment (response to treatment):  Patient has increase edema in the right knee since the walking quite a bit the past two days.  Patient lost some AROM of the knee since last visit by 5 degrees due to edema.  Patient has improved strength in the left knee with the exception of hip flexion.  Patient has improved outcomes since the initial evaluation.    Progress towards functional goals   End of Certification   Date (Body Area, Impairment Goal, Functional    Activity, Target Performance) Time Frame Status Date/   Initial   07/06/2015 Patient will demonstrate independence in prescribed HEP with proper form, sets and reps for safe discharge to an independent program. 6 weeks Progressing 08/01/15   LMS   07/06/2015 Increase knee flexion AROM to 120 degrees to allow patient to safely negotiate stairs reciprocally with railing. 6 weeks Progressing 08/01/15   LMS   07/06/2015 Increase knee extension AROM to 0 degrees to allow sleeping with <2 interruptions due to pain. 4 weeks Progressing 08/01/15   LMS   07/06/2015 Increase single leg stance to 10  seconds so patient can negotiate curbs and uneven surfaces safely. 6 weeks NT 08/01/15   LMS   07/06/2015 Increase L knee and gastroc strength to 5/5 to increase community ambulation to 30-45 minutes for necessary MD appts and grocery shopping 6 weeks Met 08/01/15   LMS     Patient requires continued skilled care to: improve ROM and strength          G Codes:   Current: G8978 Mobility CJ (20-40),Goal: G8979 Mobility CJ (20-40) as determined by subjective reports, objective reassessment, and outcome measures    Recommendations:   Extend Original "Plan of Care" to address the above impairments / functional limitations.  Justification for extension: Goals not met; x 3 visits    Signature: [name]. [place], PT, DPT [place] # [phone]  Date: 08/17/2015    Signature: Leveda Anna, MD ____________________ Date:       Patient Name: KRYSTIAN FERRENTINO  MRN: [id number]

## 2015-08-22 ENCOUNTER — Inpatient Hospital Stay: Payer: Medicare Other | Attending: Orthopaedic Surgery | Admitting: Rehabilitative and Restorative Service Providers"

## 2015-08-22 DIAGNOSIS — Z96652 Presence of left artificial knee joint: Secondary | ICD-10-CM | POA: Insufficient documentation

## 2015-08-22 DIAGNOSIS — M25562 Pain in left knee: Secondary | ICD-10-CM | POA: Insufficient documentation

## 2015-08-22 NOTE — PT/OT Therapy Note (Signed)
DAILY NOTE  08/22/2015    Time In/Out: 9:54 am- 11:28 am Total Treatment Time: 34 minutes Visit Number:  14    Certification Status Ends: 2015-09-24   G-Codes required on visit # 19    # of Authorized Visits:   Visit #:        Diagnosis (Treating/Medical):     ICD-10-CM    1. Acute pain of left knee M25.562    2. 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  Functional Changes: Patient states that her surgeon was very happy with her progress and told her it was up to her whether or not to continue with PT.  Patient wishes to continue until September 24, 2023 then be discharged to HEP.    Objective:   Treatment:  Therapeutic Exercise: to improve: Balance, Stabilization and Strength   Assisted Warm-up on rec bike x 6'  Modifications/Patient Education: Increased reps of magnum HS curls; Initiated resisted standing marches and hip abduction. (Verbal, Visual and Tactile cues for therex form)     Current Measurements (ROM, Strength, Girth, Outcomes, etc.):   NT  Modalities: None  Therapy Rationale: Other: NT       Assessment (response to treatment):   Patient is very fatigued by standing marches and utilizes trunk flexion during compensation.  She is progressing well with gym machines and could benefit from increased resistance of Bilat knee extension next visit.  Patient also continues to be uncomfortable with proprioceptive activities and requests 2 hand assist during standing marches.    Progress towards functional goals: NT    Patient requires continued skilled care to: Improve hip flexor strength for improved step/curb negotiation with reduced fall risk    Plan:  Continue with Plan of Care    Darlene Landry. [name], PT, DPT [place] # [phone]  08/22/2015

## 2015-08-22 NOTE — PT/OT Exercise Plan (Signed)
Name: Darlene Landry  Referring Physician: Leveda Anna, MD  Diagnosis:    ICD-10-CM    1. Acute pain of left knee M25.562    2. 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        Precautions:  Hx of SCI  Date of Surgery:    MD Follow-up: 701 due date:        Exercise Flow Sheet    Exercise Specifics 07/06/15  IE 07/10/15 07/12/15  NT see note 9/26 07/19/15 9/30 10/3 07/27/15 08/01/15  gcodes 10/14 08/10/15 08/10/15 10/25 10/27  08/22/15     Combo    Combo x 6'  LMS  Combo  X 6  dp Combo x 6 min  JB Combo  X 6  dp Combo x 6'  LMS Combo  6'  LMS Combo x 6' Combo x 6  dp  6'  AD 6  dp 6  dp Rec bike 6'  LMS     Heel slides   20 x with sliding board and strap  LMS  10x AROM  LMS  Ball heel slide  X 20  dp Ball slide x 20  JB Ball slide x 20  dp NT NT Ball slide knee flexion  20x  LMS Ball heel slide x 20  dp  Reclined 20x AD  home NT     SAQ   Over can  30x  LMS  SAQ x 30 3 sec hold  dp SAQ x 30  JB SAQ x 30  dp NT NT NT SAQ x 30 3 sec hold  dp    QD set x 30 5 sec hold  dp  30x AD            5 sec. 30x AD SAQ x 30 3 sec hold  dp home NT   QS over towel     30 x 5"  LMS   QD x 10 5" hold  JB Fitter bal and taps  dp Fitter  A/P and M/L  10 taps followed by 1' balance  LMS Seated HS stretch with heel on stool  3'  LMS  -------  Seated knee flexion bending  10 x 5"  LMS Prolonged knee extension stretch with heel on can  2'  LMS  ------  QS  20 x 5"  LMS Prolonged stretch with heel on stool seated x 2 15 sec hold x 3  dp  NT   NT     SLR flex   QS with SLR flex  10x  AAROM by PT  LMS  SLR x 15  dp SLR x 15  JB SLR x 10  dp NT NT NT nt  NT   NT          Step up  6"  X 10  dp NT Step up  6" L x 15  dp Step up  6"  L  15x  LMS  --------  L lateral step ups  4" step  X 10  LMS  Step up and over   4" step  5x  LMS NT NT Step up and Lateral step up  6" step  15x ea  LMS Gym step 15x forward and 10x lateral AD Gym step 15x forward and 10x lateral S.B.A.  AD   NT          LP 1 pl x 15  dp LP 1 pl x 15  See note  JB nt Mini  Squats at bar  2 x 10  LMS Mini Squats at bar   2 x 10  LMS Mini squats at bar  20 x 3"  LMS  HR at bar  20 x 3"  LMS Mini squat x 20  dp Mini squts  NT  CC walk out 10# x 2 ea  dp    CC TKE 10# x 20 5 sec hold  dp Mini squats at bar  30x  LMS  HR at bar  30x  LMS          Badger 1 pl x 15  dp NT Badger 1 pl x 15  dp Badger  1.5 pl  2 x 10  LMS  ------  Eccentric L badger  1 pl  10x  LMS Badger 1.5 pl  3 x 10  LMS  ------  Eccentric L badger  1 pl  10x  LMS Badger  1.5 pl  30x  LMS  ------  Eccentric L badger  1 pl  15x  LMS Badger 1.5 pl x 30  dp      Eccentric L badger 1 pl x 20  dp       Badger  1 pl  30x  LMS    Badger L eccentric 1 pl  15 x  LMS 1pl. B 2x10 AD    1pl. B Conc. And L eccentric 10x AD Badger 1 pl x 20  dp    1 pl up with both and eccentric control L  X 15  dp Badger 1.5 pl x 30  dp    1 pl up with both and eccentric control   L    X 20  dp Badger  1.5 pl  30x  LMS  ------  L eccentric  1 pl  20x  LMS            Magnum 10# x 15  dp Magnum  10#  2 x 10  LMS Magnum  10#  3 x 10  LMS Magnum  10#  3 x 10  LMS Magnum 15# x 30  dp Magnum  15#  3 x 10  LMS 15# 3x10 AD Magnum 15# x 30  dp Magnum 20# x 10  dp Magnum  20#  20x  LMS            Standing  Marches on airex  2 x 10  LMS  -------  Airex feet close  1'  LMS  Semi tandem with R foot in front    30"  LMS  (see note) NT   NT                NT Side stepping x 2 laps // bar  Dp    // bar SLR, hip abd, ext right LE and left hold x 10 ea  dp  YTB TKE standing x 10 10 sec hold  dp nt Standing at bar marches and hip abd  2# on ea LE  2 x 10 ea  LMS     Manual   See note  LMS  See note  dp See note  JB See note  dp See note  LMS See note  LMS See note  LMS See note  dp  See note AD See note  dp  NT   CP/MH PRN     NT  Ice/es /HP  dp NT  CP/ES post tx x 10'  LMS NT NT declined  Declined  AD Ice/es  dp Ice only  dp NT   Home Exercise Program     Issue next visit  LMS     Update next visit  LMS  Access Code: [email or web address]       Issued TKE with ytb  dp                         (Initials = supervised exercise by clinician)

## 2015-08-24 ENCOUNTER — Inpatient Hospital Stay: Payer: Medicare Other

## 2015-08-24 DIAGNOSIS — M25562 Pain in left knee: Secondary | ICD-10-CM

## 2015-08-24 DIAGNOSIS — Z96652 Presence of left artificial knee joint: Secondary | ICD-10-CM

## 2015-08-24 NOTE — PT/OT Exercise Plan (Signed)
Name: Darlene Landry  Referring Physician: Leveda Anna, MD  Diagnosis:    ICD-10-CM    1. Acute pain of left knee M25.562    2. 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        Precautions:  Hx of SCI  Date of Surgery:    MD Follow-up: 701 due date:        Exercise Flow Sheet    Exercise Specifics 07/06/15  IE 07/10/15 07/12/15  NT see note 9/26 07/19/15 9/30 10/3 07/27/15 08/01/15  gcodes 10/14 08/10/15 08/10/15 10/25 10/27  08/22/15 08/24/15      Combo    Combo x 6'  LMS  Combo  X 6  dp Combo x 6 min  JB Combo  X 6  dp Combo x 6'  LMS Combo  6'  LMS Combo x 6' Combo x 6  dp  6'  AD 6  dp 6  dp Rec bike 6'  LMS Bike  X 6  dp      Heel slides   20 x with sliding board and strap  LMS  10x AROM  LMS  Ball heel slide  X 20  dp Ball slide x 20  JB Ball slide x 20  dp NT NT Ball slide knee flexion  20x  LMS Ball heel slide x 20  dp  Reclined 20x AD  home NT Heel slides  On L LE x20  dp      SAQ   Over can  30x  LMS  SAQ x 30 3 sec hold  dp SAQ x 30  JB SAQ x 30  dp NT NT NT SAQ x 30 3 sec hold  dp    QD set x 30 5 sec hold  dp  30x AD            5 sec. 30x AD SAQ x 30 3 sec hold  dp home NT SAQ  On L  LE x 30  dp    QS over towel     30 x 5"  LMS   QD x 10 5" hold  JB Fitter bal and taps  dp Fitter  A/P and M/L  10 taps followed by 1' balance  LMS Seated HS stretch with heel on stool  3'  LMS  -------  Seated knee flexion bending  10 x 5"  LMS Prolonged knee extension stretch with heel on can  2'  LMS  ------  QS  20 x 5"  LMS Prolonged stretch with heel on stool seated x 2 15 sec hold x 3  dp  NT   NT QD with 5 sec hold  X 20 on L LE  dp      SLR flex   QS with SLR flex  10x  AAROM by PT  LMS  SLR x 15  dp SLR x 15  JB SLR x 10  dp NT NT NT nt  NT   NT SLR on L LE  2x10  dp           Step up  6"  X 10  dp NT Step up  6" L x 15  dp Step up  6"  L  15x  LMS  --------  L lateral step ups  4" step  X 10  LMS  Step up and over   4" step  5x  LMS NT NT Step up and Lateral step up  6" step  15x ea  LMS Gym step 15x  forward and 10x lateral AD Gym step 15x forward and 10x lateral S.B.A.  AD   NT Standing at // bar hip abd x 15  dp           LP 1 pl x 15  dp LP 1 pl x 15  See note  JB nt Mini Squats at bar  2 x 10  LMS Mini Squats at bar   2 x 10  LMS Mini squats at bar  20 x 3"  LMS  HR at bar  20 x 3"  LMS Mini squat x 20  dp Mini squts  NT  CC walk out 10# x 2 ea  dp    CC TKE 10# x 20 5 sec hold  dp Mini squats at bar  30x  LMS  HR at bar  30x  LMS            Badger 1 pl x 15  dp NT Badger 1 pl x 15  dp Badger  1.5 pl  2 x 10  LMS  ------  Eccentric L badger  1 pl  10x  LMS Badger 1.5 pl  3 x 10  LMS  ------  Eccentric L badger  1 pl  10x  LMS Badger  1.5 pl  30x  LMS  ------  Eccentric L badger  1 pl  15x  LMS Badger 1.5 pl x 30  dp      Eccentric L badger 1 pl x 20  dp       Badger  1 pl  30x  LMS    Badger L eccentric 1 pl  15 x  LMS 1pl. B 2x10 AD    1pl. B Conc. And L eccentric 10x AD Badger 1 pl x 20  dp    1 pl up with both and eccentric control L  X 15  dp Badger 1.5 pl x 30  dp    1 pl up with both and eccentric control   L    X 20  dp Badger  1.5 pl  30x  LMS  ------  L eccentric  1 pl  20x  LMS              Magnum 10# x 15  dp Magnum  10#  2 x 10  LMS Magnum  10#  3 x 10  LMS Magnum  10#  3 x 10  LMS Magnum 15# x 30  dp Magnum  15#  3 x 10  LMS 15# 3x10 AD Magnum 15# x 30  dp Magnum 20# x 10  dp Magnum  20#  20x  LMS              Standing  Marches on airex  2 x 10  LMS  -------  Airex feet close  1'  LMS  Semi tandem with R foot in front    30"  LMS  (see note) NT   NT                NT Side stepping x 2 laps // bar  Dp    // bar SLR, hip abd, ext right LE and left hold x 10 ea  dp  YTB TKE standing x 10 10 sec hold  dp nt Standing at bar marches and hip abd  2# on ea LE  2 x 10 ea  LMS  Manual   See note  LMS  See note  dp See note  JB See note  dp See note  LMS See note  LMS See note  LMS See note  dp  See note AD See note  dp  NT     CP/MH PRN     NT  Ice/es /HP  dp NT  CP/ES post tx x 10'  LMS NT NT  declined  Declined  AD Ice/es  dp Ice only  dp NT Declined  dp    Home Exercise Program     Issue next visit  LMS     Update next visit  LMS  Access Code: [email or web address]       Issued TKE with ytb  dp                            (Initials = supervised exercise by clinician)    Name: Darlene Landry  Referring Physician: Leveda Anna, MD  Diagnosis:    ICD-10-CM    1. Acute pain of left knee M25.562    2. 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

## 2015-08-24 NOTE — PT/OT Therapy Note (Signed)
DAILY NOTE   08/24/2015     Time In/Out: 1245 - 115 Total Treatment Time: 30 min Visit Number:  15    POC Expires:  08/31/15    Payor: MEDICARE / Plan: MEDICARE PART A AND B / Product Type: *No Product type* /    # of Authorized Visits:   Visit #:        Diagnosis (Treating/Medical):   No diagnosis found.        Subjective:  Darlene Landry's pain is Constant/continuous and is rated a 3/10.  Functional Changes: Patient stated she was walking around [employer] and helping her husband gardening.  Patient states a week ago she fell at home and landed on her right side, and bruised above her right eye patient telling this when asked to lay on the right side half way through treatment  Primary therapist was standing there when she voiced this information  Patient states she did not loose consciousness or any vision after hitting her head when falling.    Objective:   Treatment:  Therapeutic Exercise: to improve: Balance, Flexibility/ROM, Stabilization and Strength   Assisted Warm-up  combo  Modifications/Patient Education:  Increases reps to some exercises on table,       Manual Therapy:   nt      Current Measurements (ROM, Strength, Girth, Outcomes, etc.):   Bruise on the right greater trochanter 14 " x 12 "  Darlene Landry DPT into observe hip and palpated this area with tenderness present    Modalities: None  Therapy Rationale: Increase Extensiblility       Assessment (response to treatment):  Due to patient stating she fell last week patient has a small bruise above her right eye and a large bruise on her right hip observed by LPTA and primary therapist.  Patient was advised by primary therapist to have a X ray done of the right hip to rule out FX. Spoke to patients husband regarding the patient to have a X ray.  Patient reports no pain in the left knee with mat exercises, and states she has them at home and is doing them every day.  Darlene Landry DPT advised no gym exercises today, patient to ice hip and try to rest.      Progress  towards functional goals: progressing towards her goals for left knee motion and strength    Patient requires continued skilled care to: improve left knee strength    Plan:  Continue with Plan of Care check to see how right hip is    Darlene Landry, LPTA  [address]  08/24/2015

## 2015-08-29 ENCOUNTER — Inpatient Hospital Stay: Payer: Medicare Other | Admitting: Rehabilitative and Restorative Service Providers"

## 2015-08-29 DIAGNOSIS — M25562 Pain in left knee: Secondary | ICD-10-CM

## 2015-08-29 DIAGNOSIS — Z96652 Presence of left artificial knee joint: Secondary | ICD-10-CM

## 2015-08-29 NOTE — PT/OT Therapy Note (Signed)
DAILY NOTE  08/29/2015    Time In/Out: 12:00 pm - 12:45 pm Total Treatment Time: 35 minutes Visit Number:  16    Certification Status Ends: 09-07-2015   G-Codes required on visit # 19    # of Authorized Visits:   Visit #:        Diagnosis (Treating/Medical):     ICD-10-CM    1. Acute pain of left knee M25.562    2. 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  Functional Changes: Patient states that she's pain free currently but she had some pain over the weekend.  Patient states that she did have an xray of her R hip and it was negative for fracture.    Objective:   Treatment:  Therapeutic Exercise: to improve: Balance, Stabilization and Strength   Unassisted Warm-up on combo x 6'  Modifications/Patient Education: Initiated sit to stand for muscle endurance- attempted sit to stand on airex (Verbal, Visual and Tactile cues for therex form)    NMR:  Attempted sit to stand on airex, semi-tandem on airex      Current Measurements (ROM, Strength, Girth, Outcomes, etc.):   L SLS = 6 seconds    Modalities: None  Therapy Rationale: Other: NT       Assessment (response to treatment):   Patient is unable to complete sit to stand on airex without significant UE pulling which aggravates her arms.  She is able to complete sit to stand from chair but becomes fatigued with progressing reps.  Ankle weights were not used from standing marches and abduction today to ensure therex does not aggravate patient's bruised hip.  She states that the machines felt "heavier" today than last visit although resistance was not adjusted, just repetitions.  Patient's L SLS is progressing but patient continues to be unable to complete R SLS due to SCI.    Patient has reached Medicare's cap for PT services.  Continued skilled services are deemed appropriate because she has weakness and proprioceptive deficits that limit safety with community mobility.  The following  functional limitations prevent the patient from being discharged to an independent HEP at this time. They are limited walking tolerance.  Continued PT services will include manual therapy, therapeutic activity, therapeutic exercise, NMR, and modalities as needed to address these deficits.      Progress towards functional goals: Progressing towards goal 4    Patient requires continued skilled care to: Prepare for D/C next visit- review HEP and update with handout for resisted knee flexion and extension    Plan:  Continue with Plan of Care    [name]. [name], PT, DPT [place] # [phone]  08/29/2015

## 2015-08-29 NOTE — PT/OT Exercise Plan (Signed)
Name: Darlene Landry  Referring Physician: Leveda Anna, MD  Diagnosis:    ICD-10-CM    1. Acute pain of left knee M25.562    2. 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        Precautions:  Hx of SCI  Date of Surgery:    MD Follow-up: 701 due date:        Exercise Flow Sheet    Exercise Specifics 07/06/15  IE 07/10/15 07/12/15  NT see note 9/26 07/19/15 9/30 10/3 07/27/15 08/01/15  gcodes 10/14 08/10/15 08/10/15 10/25 10/27  08/22/15 08/24/15 08/29/15     Combo    Combo x 6'  LMS  Combo  X 6  dp Combo x 6 min  JB Combo  X 6  dp Combo x 6'  LMS Combo  6'  LMS Combo x 6' Combo x 6  dp  6'  AD 6  dp 6  dp Rec bike 6'  LMS Bike  X 6  dp Combo  6'     Heel slides   20 x with sliding board and strap  LMS  10x AROM  LMS  Ball heel slide  X 20  dp Ball slide x 20  JB Ball slide x 20  dp NT NT Ball slide knee flexion  20x  LMS Ball heel slide x 20  dp  Reclined 20x AD  home NT Heel slides  On L LE x20  dp NT     SAQ   Over can  30x  LMS  SAQ x 30 3 sec hold  dp SAQ x 30  JB SAQ x 30  dp NT NT NT SAQ x 30 3 sec hold  dp    QD set x 30 5 sec hold  dp  30x AD            5 sec. 30x AD SAQ x 30 3 sec hold  dp home NT SAQ  On L  LE x 30  dp NT   QS over towel     30 x 5"  LMS   QD x 10 5" hold  JB Fitter bal and taps  dp Fitter  A/P and M/L  10 taps followed by 1' balance  LMS Seated HS stretch with heel on stool  3'  LMS  -------  Seated knee flexion bending  10 x 5"  LMS Prolonged knee extension stretch with heel on can  2'  LMS  ------  QS  20 x 5"  LMS Prolonged stretch with heel on stool seated x 2 15 sec hold x 3  dp  NT   NT QD with 5 sec hold  X 20 on L LE  dp NT     SLR flex   QS with SLR flex  10x  AAROM by PT  LMS  SLR x 15  dp SLR x 15  JB SLR x 10  dp NT NT NT nt  NT   NT SLR on L LE  2x10  dp NT          Step up  6"  X 10  dp NT Step up  6" L x 15  dp Step up  6"  L  15x  LMS  --------  L lateral step ups  4" step  X 10  LMS  Step up and over   4" step  5x  LMS NT NT Step up and Lateral step up  6"  step  15x  ea  LMS Gym step 15x forward and 10x lateral AD Gym step 15x forward and 10x lateral S.B.A.  AD   NT Standing at // bar hip abd x 15  dp Sit to stand  10x  2x on airex  LMS          LP 1 pl x 15  dp LP 1 pl x 15  See note  JB nt Mini Squats at bar  2 x 10  LMS Mini Squats at bar   2 x 10  LMS Mini squats at bar  20 x 3"  LMS  HR at bar  20 x 3"  LMS Mini squat x 20  dp Mini squts  NT  CC walk out 10# x 2 ea  dp    CC TKE 10# x 20 5 sec hold  dp Mini squats at bar  30x  LMS  HR at bar  30x  LMS  Mini squats at bar  30x  LMS  HR at bar  30x  LMS          Badger 1 pl x 15  dp NT Badger 1 pl x 15  dp Badger  1.5 pl  2 x 10  LMS  ------  Eccentric L badger  1 pl  10x  LMS Badger 1.5 pl  3 x 10  LMS  ------  Eccentric L badger  1 pl  10x  LMS Badger  1.5 pl  30x  LMS  ------  Eccentric L badger  1 pl  15x  LMS Badger 1.5 pl x 30  dp      Eccentric L badger 1 pl x 20  dp       Badger  1 pl  30x  LMS    Badger L eccentric 1 pl  15 x  LMS 1pl. B 2x10 AD    1pl. B Conc. And L eccentric 10x AD Badger 1 pl x 20  dp    1 pl up with both and eccentric control L  X 15  dp Badger 1.5 pl x 30  dp    1 pl up with both and eccentric control   L    X 20  dp Badger  1.5 pl  30x  LMS  ------  L eccentric  1 pl  20x  LMS  Badger  1.5 pl  30x  LMS  ------  L eccentric  1 pl  30x  LMS            Magnum 10# x 15  dp Magnum  10#  2 x 10  LMS Magnum  10#  3 x 10  LMS Magnum  10#  3 x 10  LMS Magnum 15# x 30  dp Magnum  15#  3 x 10  LMS 15# 3x10 AD Magnum 15# x 30  dp Magnum 20# x 10  dp Magnum  20#  20x  LMS  Magnum  20#  30x  LMS            Standing  Marches on airex  2 x 10  LMS  -------  Airex feet close  1'  LMS  Semi tandem with R foot in front    30"  LMS  (see note) NT   NT                NT Side stepping x 2 laps // bar  Dp    //  bar SLR, hip abd, ext right LE and left hold x 10 ea  dp  YTB TKE standing x 10 10 sec hold  dp nt Standing at bar marches and hip abd  2# on ea LE  2 x 10 ea  LMS  Standing marches at bar  2 x  10  LMS  Standing hip abd at bar  2 x 10  Ea LE  LMS  --------  Semi tandem stance on airex  1' ea  Feet together on airex  30"  LMS     Manual   See note  LMS  See note  dp See note  JB See note  dp See note  LMS See note  LMS See note  LMS See note  dp  See note AD See note  dp  NT  NT   CP/MH PRN     NT  Ice/es /HP  dp NT  CP/ES post tx x 10'  LMS NT NT declined  Declined  AD Ice/es  dp Ice only  dp NT Declined  dp Declined  LMS   Home Exercise Program     Issue next visit  LMS     Update next visit  LMS  Access Code: [email or web address]       Issued TKE with ytb  dp    Update next visit for D/C  LMS                        (Initials = supervised exercise by clinician)    Name: Darlene Landry  Referring Physician: Leveda Anna, MD  Diagnosis:    ICD-10-CM    1. Acute pain of left knee M25.562    2. 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

## 2015-08-31 ENCOUNTER — Inpatient Hospital Stay: Payer: Medicare Other | Admitting: Rehabilitative and Restorative Service Providers"

## 2015-08-31 DIAGNOSIS — M25562 Pain in left knee: Secondary | ICD-10-CM

## 2015-08-31 DIAGNOSIS — Z96652 Presence of left artificial knee joint: Secondary | ICD-10-CM

## 2015-08-31 NOTE — PT/OT Plan of Care (Signed)
Discharge Summary IPTC Medicare Provider #: [id number]                [name]  MRN: [id number]  [employer] Medicare #: Medicare Sub. Num: [id number]  DOI: Onset of Problem / Injury: 06/19/15 Date of Surgery: 06/19/15 SOC: 07/06/2015     Diagnosis:     ICD-10-CM    1. Acute pain of left knee M25.562    2. 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 female presenting with L knee pain who requires Physical Therapy for the following:  Impairments: abnormal gait; reduced L knee AROM/strength; limited proprioception; reduced L patellar mobility; L LE muscle tightness    Functional Limitations: L knee pain; Abnormal ambulation with assistive device; patient reports she walks backwards up stairs to get into the house because it's easier.  Patient uses stair lift in house    Plan Of Care: Body Mechanics Education, NMR, Proprioceptive Activites, Statistician, Instruction in HEP, Therapeutic Exercise, Balance/Gait training, Soft Tissue/Joint Mobilization Grade I-IV L patellofemoral and tibiofemoral joint MOBS and Other: taping  Frequency/Duration: 3 times a week for 4 weeks followed 2 times a week for 4 weeks. Anticipated D/C date: 08/31/15    Certification Dates: From: 07/06/15  To: 08/31/15    Goals:   End of Certification   Date (Body Area, Impairment Goal, Functional   Activity, Target Performance) Time Frame Status Date/  Initial   07/06/2015   Patient will demonstrate independence in prescribed HEP with proper form, sets and reps for safe discharge to an independent program.  8 weeks Met  08/31/2015  LMS   07/06/2015   Increase knee flexion AROM to 120 degrees to allow patient to safely negotiate stairs reciprocally  with railing.  8 weeks Improved, reached plateau 08/31/2015  LMS   07/06/2015   Increase knee extension AROM to 0 degrees to allow sleeping with <2 interruptions due to pain.  8 weeks Improved, reached plateau 08/31/2015  LMS   07/06/2015   Increase single leg stance to 10 seconds so patient can negotiate curbs and uneven surfaces safely.  8 weeks Improved, reached plateau 08/31/2015  LMS   07/06/2015   Increase L knee and gastroc strength to 5/5 to increase community ambulation to 30-45 minutes for necessary MD appts and grocery shopping  8 weeks Met 08/31/2015  LMS     Signature: [name]. [place], PT, DPT [place] # [phone]  Date: 07/06/2015    Signature: Leveda Anna, MD ___________________________ Date:       End of Certification Status:   Service Dates:   From:   07/06/2015 To: 08/31/2015  Visits from [facility]: 17    Objective Status:  Subjective:  Darlene Landry's pain is Intermittent.  Functional Changes: Patient states that she did a lot of standing and walking yesterday so today she feels weak.  She wishes to be discharged today to continue independently with HEP.    Objective:       Current Measurements (ROM, Strength, Girth, Outcomes, etc.):   Discharge Summary   Outcome Measure:                LEFS L Score: 28% (IE); 57.5% (10/11)  LEFS  56%             10/27              Pain Score: 50% (IE); 37% (10/11)                                                      PSFS    30%            10/27              Rate Satisfaction with Current Function: 5/10 (IE); 8/10 (10/11)       Function  8/10           10/27              HEP adherence:Most Days      Range of Motion: (degrees)  Initial Right           AROM   Knee      InitialLeft AROM      9/26         Left AROM      Left AROM       07/27/15  L AROM   10/27  L AROM  08/31/15   102     Flexion      80      95        105    110  108   0      Extension      -13       -10       -5    -3  -3         Initial         R   10/11     R   10/27   R  LE  Strength       MMT /5    Initial       L   10/11      L    10/27 L    4    4+    4+  Hip Flexion    4    4+    4+    5     5   5   Hip Abduction    5    5   5    4     4   5   Hip Adduction    4    4+   5    5-     5   5  Quadriceps    4+    5    5    5-     5   5  Hamstrings    4+     5-   5    5     5   5   Ankle Dorsiflexion    4+     5   5    5     5   5   Ankle Plantarflexion    5    5   5     (blank fields were intentionally left blank)    Girth/Edema: Other: See below         Initial R     R 10/27   R 9/30    Initial L  L 9/30    L 08/17/15    Mid Patellar     48.5       44 cm     50.3     46cm    45cm    (blank fields were intentionally left blank)    L SLS = 6 seconds (08/29/15)     Assessment (response to treatment):   Patient has reduced L knee flexion today due to increased swelling after increased activities yesterday.  She has good form with all activities except SLR flexion.  She needs verbal, visual, and tactile cues to avoid quad lag.  She has good compliance to HEP and is appropriate for D/C today.      Patient has reached Medicare's cap for PT services. Continued skilled services are deemed appropriate because she has weakness and proprioceptive deficits that limit safety with community mobility. The following functional limitations prevent the patient from being discharged to an independent HEP at this time. They are limited walking tolerance. Continued PT services will include manual therapy, therapeutic activity, therapeutic exercise, NMR, and modalities as needed to address these deficits.    Progress towards functional goals: Goals 1 and 5 met; Improved L knee AROM and proprioception but plateau has been reached    Plan:  Discharged from P.T./O.T. Plateau in progress    G Codes:   Goal: G8979 Mobility CJ (20-40) Discharge: U9811 Mobility CJ (20-40) as determined by subjective reports, objective reassessment, and outcome measures    Recommendations:   Discharge / Discontinue  Physical/Occupational Therapy.  Reason for D/C:  Plateau in progress    Signature: [name]. [place], PT, DPT [place] # [phone]  Date: 08/31/2015    Signature: Leveda Anna, MD ____________________ Date:       [name]  MRN: [id number]

## 2015-08-31 NOTE — PT/OT Therapy Note (Signed)
DAILY NOTE  2015-09-15    Time In/Out: 11:15 am - 12:10 pm Total Treatment Time: 55 minutes Visit Number:  17    Certification Status Ends: 09/15/15   G-Codes required on visit # 19    # of Authorized Visits:   Visit #:        Diagnosis (Treating/Medical):     ICD-10-CM    1. Acute pain of left knee M25.562    2. 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  Functional Changes: Patient states that she did a lot of standing and walking yesterday so today she feels weak.  She wishes to be discharged today to continue independently with HEP.    Objective:   Treatment:  Therapeutic Exercise: to improve: Flexibility/ROM, Stabilization and Strength   Assisted Warm-up on combo x 6'  Modifications/Patient Education:reviewed therex and updated HEP for discharge    Manual Therapy:   Objective measures      Current Measurements (ROM, Strength, Girth, Outcomes, etc.):   Discharge Summary   Outcome Measure:                LEFS L Score: 28% (IE); 57.5% (10/11)                                              LEFS  56%             10/27              Pain Score: 50% (IE); 37% (10/11)                                                      PSFS    30%            10/27              Rate Satisfaction with Current Function: 5/10 (IE); 8/10 (10/11)       Function  8/10           10/27              HEP adherence:Most Days      Range of Motion: (degrees)  Initial Right           AROM   Knee      InitialLeft AROM      9/26         Left AROM      Left AROM       07/27/15  L AROM   10/27  L AROM  Sep 15, 2015   102     Flexion      80      95        105    110  108   0      Extension      -13       -10       -5    -3  -3         Initial         R   10/11     R   10/27   R  LE Strength       MMT /5    Initial       L   10/11      L    10/27 L    4    4+    4+  Hip Flexion    4    4+    4+    5     5   5   Hip Abduction    5    5   5    4     4   5   Hip Adduction    4    4+   5    5-     5   5   Quadriceps    4+    5    5    5-     5   5  Hamstrings    4+     5-   5    5     5   5   Ankle Dorsiflexion    4+     5   5    5     5   5   Ankle Plantarflexion    5    5   5     (blank fields were intentionally left blank)    Girth/Edema: Other: See below         Initial R      R 9/30    Initial L     L 9/30    L 08/17/15    Mid Patellar     48.5     44 cm     50.3     46cm    45cm    (blank fields were intentionally left blank)    L SLS = 6 seconds (08/29/15)    Modalities: Electrical Stimulation with Ice: Premod 15 min. Location L knee Position Recumbant  Therapy Rationale: Decrease Pain, Decrease Inflammation and Decrease Edema       Assessment (response to treatment):   Patient has reduced L knee flexion today due to increased swelling after increased activities yesterday.  She has good form with all activities except SLR flexion.  She needs verbal, visual, and tactile cues to avoid quad lag.  She has good compliance to HEP and is appropriate for D/C today.    Patient has reached Medicare's cap for PT services. Continued skilled services are deemed appropriate because she has weakness and proprioceptive deficits that limit safety with community mobility. The following functional limitations prevent the patient from being discharged to an independent HEP at this time. They are limited walking tolerance. Continued PT services will include manual therapy, therapeutic activity, therapeutic exercise, NMR, and modalities as needed to address these deficits.    Progress towards functional goals: Goals 1 and 5 met; Improved L knee AROM and proprioception but plateau has been reached    Plan:  Discharged from P.T./O.T. Plateau in progress    [name]. [name], PT, DPT [place] # [phone]  08/31/2015

## 2015-08-31 NOTE — PT/OT Exercise Plan (Signed)
Name: Darlene Landry  Referring Physician: Leveda Anna, MD  Diagnosis:    ICD-10-CM    1. Acute pain of left knee M25.562    2. 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 30x AD SAQ x 30 3 sec hold  dp home NT SAQ  On L  LE x 30  dp NT SAQ  2#  30x  LMS   QS over towel    Prolonged knee extension stretch with heel on can  2'  LMS  ------  QS  20 x 5"  LMS Prolonged stretch with heel on stool seated x 2 15 sec hold x 3  dp  NT   NT QD with 5 sec hold  X 20 on L LE  dp NT NT     SLR flex  NT nt  NT   NT SLR on L LE  2x10  dp NT L SLR flex  2 x 10  LMS       NT Step up and Lateral step up  6" step  15x ea  LMS Gym step 15x forward and 10x lateral AD Gym step 15x forward and 10x lateral S.B.A.  AD   NT Standing at // bar hip abd x 15  dp Sit to stand  10x  2x on airex  LMS NT       Mini squats at bar  20 x 3"  LMS  HR at bar  20 x 3"  LMS Mini squat x 20  dp Mini squts  NT  CC walk out 10# x 2 ea  dp    CC TKE 10# x 20 5 sec hold  dp Mini squats at bar  30x  LMS  HR at bar  30x  LMS  Mini squats at bar  30x  LMS  HR at bar  30x  LMS NT       Badger  1.5 pl  30x  LMS  ------  Eccentric L badger  1 pl  15x  LMS Badger 1.5 pl x 30  dp      Eccentric L badger 1 pl x 20  dp       Badger  1 pl  30x  LMS    Badger L eccentric 1 pl  15 x  LMS 1pl. B 2x10 AD    1pl. B Conc. And L eccentric 10x AD Badger 1 pl x 20  dp    1 pl up with both and eccentric  control L  X 15  dp Badger 1.5 pl x 30  dp    1 pl up with both and eccentric control   L    X 20  dp Badger  1.5 pl  30x  LMS  ------  L eccentric  1 pl  20x  LMS  Badger  1.5 pl  30x  LMS  ------  L eccentric  1 pl  30x  LMS Badger  1.5 pl  30x  LMS  ------  L eccentric 1 pl  30x  LMS       Magnum  10#  3 x 10  LMS Magnum 15# x 30  dp Magnum  15#  3 x 10  LMS 15# 3x10 AD Magnum 15# x 30  dp Magnum 20# x 10  dp Magnum  20#  20x  LMS  Magnum  20#  30x  LMS Magnum  20#  30x  LMS     NT   NT                NT Side stepping x 2 laps // bar  Dp    // bar SLR, hip abd, ext right LE and left hold x 10 ea  dp  YTB TKE standing x 10 10 sec hold  dp nt Standing at bar marches and hip abd  2# on ea LE  2 x 10 ea  LMS  Standing marches at bar  2 x 10  LMS  Standing hip abd at bar  2 x 10  Ea LE  LMS  --------  Semi tandem stance on airex  1' ea  Feet together on airex  30"  LMS NT     Manual  See note  LMS See note  dp  See note AD See note  dp  NT  NT Reassess  LMS   CP/MH PRN    NT declined  Declined  AD Ice/es  dp Ice only  dp NT Declined  dp Declined  LMS CP/ES post tx x 15'  LMS   Home Exercise Program    Access Code: [email or web address]       Issued TKE with ytb  dp    Update next visit for D/C  LMS Updated for D/C  LMS                 (Initials = supervised exercise by clinician)    Name: Darlene Landry  Referring Physician: Leveda Anna, MD  Diagnosis:    ICD-10-CM    1. Acute pain of left knee M25.562    2. 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

## 2016-06-06 ENCOUNTER — Encounter: Payer: Self-pay | Admitting: Rehabilitative and Restorative Service Providers"

## 2016-06-06 ENCOUNTER — Inpatient Hospital Stay: Payer: Medicare Other | Attending: Orthopaedic Surgery | Admitting: Rehabilitative and Restorative Service Providers"

## 2016-06-06 VITALS — BP 136/75 | HR 75

## 2016-06-06 DIAGNOSIS — M25551 Pain in right hip: Secondary | ICD-10-CM | POA: Insufficient documentation

## 2016-06-06 NOTE — PT/OT Therapy Note (Signed)
INITIAL EVALUATION (Hip)    Name: Darlene Landry Age: 75 y.o. Occupation: Retired SOC: 06/06/2016  Referring Physician: Leveda Anna, MD MD recheck: None scheduled DOS:   DOI: Onset of Problem / Injury: 03/06/16  # of Authorized Visits:   Visit #      Diagnosis (Treating/Medical):     ICD-10-CM    1. Right hip pain M25.551         SUBJECTIVE:    Mechanism of Injury: Patient reports onset of R lateral hip pain 3 months ago.  She states that before and after that onset she noticed R SI joint pain.  She states that the other day after moving things around the house she aggravated it more and has had some radiating pain down R posterior leg.  Patient states that she moved the boxes but pushing/kicking them with her leg.  She states that before moving boxes she would get radiating pain down lateral thigh.  Patient saw orthopedic MD who performed R hip xrays (Unremarkable) and referred patient to PT.  Patient was told if PT doesn't resolve issue to go see her pain management MD for possible injections.    Patient's reason for seeking PT /Functional Limitations(PLOF): R hip pain; difficulty with walking particularly in the morning; Sleep disturbances 5 nights/week (PLOF: prior sleep 6-7 hrs/night; no prior difficulty with ambulation in the morning; able to walk 45 minutes before sitting    Past Medical History:   Past Medical History   Diagnosis Date   . Hypertensive disorder    . Hypothyroidism    . Hyperlipidemia    . Spinal cord injury without spinal bone injury    . Cervical stenosis of spinal canal    . Heart murmur      "occasionally"   . Headache    . Neuropathy      left foot more than right, both hands, right arm (post spinal cord injury)   . Arthritis    . Eczema      legs   . Fracture      cervical, fx left arm age 33, fx facial bone   . Bilateral cataracts      had CE bilat   . Anxiety      well controlled     Medications: .  aspirin EC 81 MG EC tablet  .  atorvastatin (LIPITOR) 20 MG tablet  .   docusate sodium (COLACE) 100 MG capsule  .  furosemide (LASIX) 20 MG tablet  .  gabapentin (NEURONTIN) 300 MG capsule  .  levothyroxine (SYNTHROID, LEVOTHROID) 75 MCG tablet  .  losartan (COZAAR) 100 MG tablet  .  Magnesium 100 MG CAPS  .  PARoxetine (PAXIL) 10 MG tablet  .  senna-docusate (PERICOLACE) 8.6-50 MG per tablet        Other Treatment/Prior Therapy: No  Prior Hospitalization: No  Hand Dominance: Dominant Hand: Left Involved Side: Involved Side: Right   DiagnosticTests: xrays of R hip (unremarkable)    Outcome Measure:                 LEFS R Score: 42                        % Pain Score: 40% Rate Satisfaction with Current Function: 2/10   Living Environment: Type of Residence: Multi-story home      Dwelling Entrance:     Patient lives with: Living Arrangements: Spouse/significant other    OBJECTIVE:  Vitals: BP: 136/75 mmHg Heart Rate: 75 Comments: taken in L UE while seated    Observation/Posture/Gait/Integumentary:  Ambulation: with Single point cane  Gait: Within functional limits  Palpation: Pain to palpation: R GT bursa     Knee AROM: WFL  Ankle AROM: WFL    Initial   R   R LE Strength  MMT /5 Initial  L    L   3+  Hip Flexion 4      Hip Extension     4  Hip Abduction 5    4  Hip Adduction 5    4+  Hip Internal Rot 5    4+  Hip External Rot 5    4  Quadriceps 5    5  Hamstrings 5    5   Ankle Dorsiflexion 5    4+  Ankle Plantarflexion 5    (blank fields were intentionally left blank)    Flexibility:    Comment:   Hamstrings Restricted Right 90/90 position R = -40 ; L = 26 degs   Gastroc Restricted Bilateral      Functional Strength:   Heel Raises:  Bilateral: at least 10 reps      Balance:  SLS R: Eyes Open (EO): unable  SLS L: Eyes Open (EO): 6 sec.     Negative SLR test  Normal R hip PROM in all planes  Negative Impingement test  L on L sacral torsion  Pelvic rotation    Treatment Initial Visit:  Evaluation   Therapeutic Activity: Discussed hip bursitis/tendonitis pathology and how weakness in R  side resulting from spinal cord injury can make patient more susceptible to strain related injuries; Discussed sacral torsion pathology and correction; Reviewed role of PT and POC  Manual: 3 plane sacrum and R piriformis S-CS in seated; L on L sacral torsion correction in L S/L.  Rehab Potential:good  Is patient aware of diagnosis: Yes  For Next Visit Add Reassess alignment and initiate hip strengthening    Darlene Landry. Tilman Neat, DPT [place] # [phone]  06/06/2016    Total Treatment (billable) Time:  40    Total Timed Minutes:  15    Plan of Care  [facility]TC Medicare Provider #: [phone]                [name]  MRN: [id number]  [facility] Medicare #: Medicare Sub. Num: [id number] A  DOI: Onset of Problem / Injury: 03/06/16 DOS: N/A  SOC: 06/06/2016     Diagnosis:     ICD-10-CM    1. Right hip pain M25.551          G Codes: Evaluation / Current: Y8657 Mobility CK (40-60) Goal: G8979 Mobility CJ (20-40)  Primary Functional Deficit: Mobility: Walking and Moving Around G-codes determined by: subjective reports, objective evaluation, and outcome measures    ASSESSMENT: the patient is a 76 y.o. 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    Pain located: R lateral hip    Clinical presentation: stable     Functional Limitations (PLOF): R hip pain; difficulty with walking particularly in the morning; Sleep disturbances 5 nights/week (PLOF: prior sleep 6-7 hrs/night; no prior difficulty with ambulation  in the morning; able to walk 45 minutes before sitting    Plan Of Care: Body Mechanics Education, NMR, Proprioceptive Activites, Electrical Stimulation, Instruction in HEP,  Ultrasound, Therapeutic Exercise, Therapeutic Activities, Dry Needling, Balance/Gait training, Soft Tissue/Joint Mobilization Grade I-III R hip lateral axis distraction and Other: taping  Frequency/Duration: 2 times a week for 12 sessions. Certification Status Ends: 08/04/2016    Goals:   Goal Status   Date (Body Area, Impairment Goal, Functional   Activity, Target Performance) Time Frame Status Date/  Initial   06/06/2016   Patient will demonstrate independence in prescribed HEP with proper form, sets and reps for safe discharge to an independent program.  12 sessions Initial Eval     06/06/2016   Improve Lower Extremity Functional Score (LEFS) to 60% to exceed Minimal Detectable Change (MDC) of 9 points.     12 sessions Initial Eval    06/06/2016   Increase R LE strength to at least 4+/5 to allow patient to ambulate without pain for up to 45 minutes  12 sessions Initial Eval    06/06/2016   Increase single leg stance to 5 seconds so patient can negotiate curbs and uneven surfaces safely.  12 sessions Initial Eval    06/06/2016   Improve R posterior leg flexibility (HS = -30 degs) to reduce radicular pain to above knee only and reduce occurrence by 50%  12 sessions Initial Eval      Signature: Darlene Landry. [place], PT, DPT [place] # [phone]  Date: 06/06/2016    Signature: Leveda Anna, MD ___________________________ Date: ____________    [name]  MRN: [id number]

## 2016-06-11 ENCOUNTER — Inpatient Hospital Stay: Payer: Medicare Other

## 2016-06-11 DIAGNOSIS — M25551 Pain in right hip: Secondary | ICD-10-CM

## 2016-06-11 NOTE — PT/OT Exercise Plan (Signed)
Name: Darlene Landry  Referring Physician: Leveda Anna, MD  Diagnosis:    ICD-10-CM    1. Right hip pain M25.551        Precautions: Prior SCI- R sided weakness; B knee replacements  Date of Surgery:    MD Follow-up: None scheduled    701 due date: 08/04/2016        Exercise Flow Sheet    Exercise Specifics 07/02/16        G-codes/PN   Rec bike    Combo x 6'  AD           Supine heel slides-unilateral    NT           Supine hip abduction with sliding board    NT           S/L clams    NT           Bridges with hip add    NT           Supine resisted clams    NT           Standing 4 way hip    NT           Tandem stance/ SLS    NT           Fitter    NT           Mini Squats/sit to stand    NT                                     Manual  See note AD           MOC    Ice/ES  AD           Home Exercise Program               (Initials = supervised exercise by clinician)    Plan Of Care: Body Mechanics Education, NMR, Proprioceptive Activites, Electrical Stimulation, Instruction in HEP, Ultrasound, Therapeutic Exercise, Therapeutic Activities, Dry Needling, Balance/Gait training, Soft Tissue/Joint Mobilization Grade I-III R hip lateral axis distraction and Other: taping  Frequency/Duration: 2 times a week for 12 sessions. Certification Status Ends: 08/04/2016    Goals:   Goal Status   Date (Body Area, Impairment Goal, Functional   Activity, Target Performance) Time Frame Status Date/  Initial   06/06/2016   Patient will demonstrate independence in prescribed HEP with proper form, sets and reps for safe discharge to an independent program.  12 sessions Initial Eval     06/06/2016   Improve Lower Extremity Functional Score (LEFS) to 60% to exceed Minimal Detectable Change (MDC) of 9 points.     12 sessions Initial Eval    06/06/2016   Increase R LE strength to at least 4+/5 to allow patient to ambulate without pain for up to 45 minutes  12 sessions Initial Eval    06/06/2016   Increase single leg stance to 5 seconds  so patient can negotiate curbs and uneven surfaces safely.  12 sessions Initial Eval    06/06/2016   Improve R posterior leg flexibility (HS = -30 degs) to reduce radicular pain to above knee only and reduce occurrence by 50%  12 sessions Initial Eval      Signature: [name]. [place], PT, DPT [place] # [phone]  Date: 06/06/2016    Signature: Leveda Anna, MD ___________________________  Date: ____________    [name]  MRN: [id number]

## 2016-06-11 NOTE — PT/OT Plan of Care (Signed)
Plan of Care  [facility]TC Medicare Provider #: [phone]                [name]  MRN: [id number]  [facility] Medicare #: Medicare Sub. Num: [id number] A  DOI: Onset of Problem / Injury: 03/06/16 DOS: N/A  SOC: 06/06/2016     Diagnosis:     ICD-10-CM    1. Right hip pain M25.551          G Codes: Evaluation / Current: A2130 Mobility CK (40-60) Goal: G8979 Mobility CJ (20-40)  Primary Functional Deficit: Mobility: Walking and Moving Around G-codes determined by: subjective reports, objective evaluation, and outcome measures    ASSESSMENT: the patient is a 75 y.o. 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    Pain located: R lateral hip    Clinical presentation: stable     Functional Limitations (PLOF): R hip pain; difficulty with walking particularly in the morning; Sleep disturbances 5 nights/week (PLOF: prior sleep 6-7 hrs/night; no prior difficulty with ambulation in the morning; able to walk 45 minutes before sitting    Plan Of Care: Body Mechanics Education, NMR, Proprioceptive Activites, Electrical Stimulation, Instruction in HEP, Ultrasound, Therapeutic Exercise, Therapeutic Activities, Dry Needling, Balance/Gait training, Soft Tissue/Joint Mobilization Grade I-III R hip lateral axis distraction and Other: taping  Frequency/Duration: 2 times a week for 12 sessions. Certification Status Ends: 08/04/2016    Goals:   Goal Status   Date (Body Area, Impairment Goal, Functional   Activity, Target Performance) Time Frame Status Date/  Initial   06/06/2016   Patient will demonstrate independence in prescribed HEP with proper form, sets and reps for safe  discharge to an independent program.  12 sessions Initial Eval     06/06/2016   Improve Lower Extremity Functional Score (LEFS) to 60% to exceed Minimal Detectable Change (MDC) of 9 points.     12 sessions Initial Eval    06/06/2016   Increase R LE strength to at least 4+/5 to allow patient to ambulate without pain for up to 45 minutes  12 sessions Initial Eval    06/06/2016   Increase single leg stance to 5 seconds so patient can negotiate curbs and uneven surfaces safely.  12 sessions Initial Eval    06/06/2016   Improve R posterior leg flexibility (HS = -30 degs) to reduce radicular pain to above knee only and reduce occurrence by 50%  12 sessions Initial Eval      Signature: [name]. [place], PT, DPT [place] # [phone]  Date: 06/06/2016    Signature: Leveda Anna, MD ___________________________ Date: ____________    [name]  MRN: [id number]

## 2016-06-11 NOTE — PT/OT Therapy Note (Signed)
DAILY NOTE  06/11/2016       Total Treatment (Billable) Time: 53'  Total Timed Minutes: 40'  Visit Number:  2    Certification Status Ends: 31-Aug-2016   G-Codes required on visit # 10  KX required on visit: 17    Payor: MEDICARE / Plan: MEDICARE PART A AND B / Product Type: *No Product type* /   # of Authorized Visits:   Visit #:      Diagnosis (Treating/Medical):     ICD-10-CM    1. Right hip pain M25.551          Subjective:  Lashell states she felt better after last visit, but did feel like my R hip was higher for a few hours. Last night was a 8/10.  My husband has a to see a doctor at [facility] every 6 weeks for his eyes and we have to do a lot of walking in the hospital. We were there yesterday. My husband put a pain patch on me this morning, more towards my back R side.  Functional Status: none.    Objective:   Treatment:  Therapeutic Exercise: to improve: Flexibility/ROM    Warm-up on combo bike (LE's only) x 6'  with subjective obtained to assist with today's treatment session  Modifications/Patient Education: time spent on manual therapy -see below.      Manual Therapy:   R SKTC, piriformis, hip IR/ER, HS, hip add, and ITB stretching all supine  Check pelvis alignment.  Patient L knee does not extend fully supine unless therapist pushes it done and standing patient unable to fully extend her L knee.  Pelvis equal, but L LE would be longer if she could extend her L knee.  Performed R LE distraction supine, then R LE longer supine.  Then performed METs: L hip ext and R hip flexion followed by hip adduction.      Initial Evaluation Reference and/orCurrent Measurements (ROM, Strength, Girth, Outcomes, etc.):      R LE still longer supine after manual.    Modalities: Electrical Stimulation with Ice: Premod 15 min. Location R hip Position Recumbant  Therapy Rationale: Decrease Pain, Decrease Inflammation and Increase Extensiblility       Assessment (response to treatment):   Patient very tender to palpation distal  to R greater trochanter into ITB, but states her R leg side is sensitive anyway.  Pain 3/10 when touching R SI.  Challenged with all transfers seated and on the table.    Progress towards functional goals: nt    Patient requires continued skilled care to: increase in R LE strength for less pain with ambulation.    Plan:  Continue with Plan of Care    Claria Dice, LPTA  06/11/2016

## 2016-06-11 NOTE — PT/OT Exercise Plan (Signed)
Name: Darlene Landry  Referring Physician: Leveda Anna, MD  Diagnosis:    ICD-10-CM    1. Right hip pain M25.551        Precautions: Prior SCI- R sided weakness; B knee replacements  Date of Surgery:    MD Follow-up: None scheduled    701 due date: 08/04/2016        Exercise Flow Sheet    Exercise Specifics         G-codes/PN   Rec bike               Supine heel slides-unilateral               Supine hip abduction with sliding board               S/L clams               Bridges with hip add               Supine resisted clams               Standing 4 way hip               Tandem stance/ SLS               Fitter               Mini Squats/sit to stand                                         Manual             MOC               Home Exercise Program               (Initials = supervised exercise by clinician)    Plan Of Care: Body Mechanics Education, NMR, Proprioceptive Activites, Electrical Stimulation, Instruction in HEP, Ultrasound, Therapeutic Exercise, Therapeutic Activities, Dry Needling, Balance/Gait training, Soft Tissue/Joint Mobilization Grade I-III R hip lateral axis distraction and Other: taping  Frequency/Duration: 2 times a week for 12 sessions. Certification Status Ends: 08/04/2016    Goals:   Goal Status   Date (Body Area, Impairment Goal, Functional   Activity, Target Performance) Time Frame Status Date/  Initial   06/06/2016   Patient will demonstrate independence in prescribed HEP with proper form, sets and reps for safe discharge to an independent program.  12 sessions Initial Eval     06/06/2016   Improve Lower Extremity Functional Score (LEFS) to 60% to exceed Minimal Detectable Change (MDC) of 9 points.     12 sessions Initial Eval    06/06/2016   Increase R LE strength to at least 4+/5 to allow patient to ambulate without pain for up to 45 minutes  12 sessions Initial Eval    06/06/2016   Increase single leg stance to 5 seconds so patient can negotiate curbs and uneven surfaces safely.   12 sessions Initial Eval    06/06/2016   Improve R posterior leg flexibility (HS = -30 degs) to reduce radicular pain to above knee only and reduce occurrence by 50%  12 sessions Initial Eval      Signature: [name]. [place], PT, DPT [place] # [phone]  Date: 06/06/2016    Signature: Leveda Anna, MD ___________________________ Date: ____________    Patient Name: Darlene  NAVEH Landry  MRN: [id number]

## 2016-06-13 ENCOUNTER — Inpatient Hospital Stay: Payer: Medicare Other | Admitting: Rehabilitative and Restorative Service Providers"

## 2016-06-13 DIAGNOSIS — M25551 Pain in right hip: Secondary | ICD-10-CM

## 2016-06-13 NOTE — PT/OT Therapy Note (Signed)
DAILY NOTE  06/13/2016       Total Treatment (Billable) Time: 60  Total Timed Minutes: 45 Visit Number:  3    Certification Status Ends: August 10, 2016   G-Codes required on visit # 10  KX required on visit: 17    Payor: MEDICARE / Plan: MEDICARE PART A AND B / Product Type: *No Product type* /   # of Authorized Visits: 12 Visit #: 3    Diagnosis (Treating/Medical):     ICD-10-CM    1. Right hip pain M25.551            Subjective:  Darlene Landry's pain is Constant/continuous and is rated a 4-7/10.  Functional Status: Patient reports her pain is the same, maybe a little worse.  She states that her pain is in the lateral R hip, gluteal, and lateral R leg.    Objective:   Treatment:  Therapeutic Exercise: to improve: Stabilization and Strength    Warm-up on combo x 6' with subjective obtained to assist with today's treatment session  Modifications/Patient Education: Initiated therex program and issued HEP Verbal, Visual and Tactile cues for therex form    NMR:  SLR abd (S/L and supine) and clams; tactile cues and moderate assist for S/L SLR abduction; NMR to increase neuromuscular activation of gluteus medius and reduced Trendelenberg pattern with WB/ambulation    Manual Therapy:   Seated 3 plane sacrum and R hip S-CS  STM to R gluteal and ITB      Modalities: Electrical Stimulation with Ice: Premod 15 min. Location R lateral hip Position Supine  Therapy Rationale: Decrease Pain, Decrease Spasm, Decrease Inflammation and Decrease Edema       Assessment (response to treatment):   Patient continues to present with pelvic asymmetry which corrects easily in sitting.  Patient has R hip weakness, most pronounced with hip abd.    Progress towards functional goals: Issued HEP    Patient requires continued skilled care to: Increase R hip strength and gluteal stabilization for reduced strain with standing activities    Plan:  Continue with Plan of Care    [name]. [name], PT, DPT [place] # [phone]  06/13/2016

## 2016-06-13 NOTE — PT/OT Exercise Plan (Signed)
Name: Darlene Landry  Referring Physician: Leveda Anna, MD  Diagnosis:    ICD-10-CM    1. Right hip pain M25.551        Precautions: Prior SCI- R sided weakness; B knee replacements  Date of Surgery:    MD Follow-up: None scheduled    701 due date: 08/04/2016        Exercise Flow Sheet    Exercise Specifics June 18, 2016 06/20/2016       G-codes/PN   Rec bike    Combo x 6'  AD Combo x 6'  LMS          Supine heel slides-unilateral    NT Supine heel slides  20x5"  LMS          Supine hip abduction with sliding board    NT S/L SLR hip abd with PT assist  10x  LMS  -----------  Supine hip abd with sliding board  20x5"  LMS          S/L clams    NT S/L clams  20x  LMS          Bridges with hip add    NT NT          Supine resisted clams    NT NT          Standing 4 way hip    NT NT          Tandem stance/ SLS    NT NT          Fitter    NT NT          Mini Squats/sit to stand    NT NT                                    Manual  See note AD See note  LMS          MOC    Ice/ES  AD CP/ES post tx x 15'  LMS          Home Exercise Program     Issued HEP (see media)  LMS          (Initials = supervised exercise by clinician)    Plan Of Care: Body Mechanics Education, NMR, Proprioceptive Activites, Electrical Stimulation, Instruction in HEP, Ultrasound, Therapeutic Exercise, Therapeutic Activities, Dry Needling, Balance/Gait training, Soft Tissue/Joint Mobilization Grade I-III R hip lateral axis distraction and Other: taping  Frequency/Duration: 2 times a week for 12 sessions. Certification Status Ends: 08/04/2016    Goals:   Goal Status   Date (Body Area, Impairment Goal, Functional   Activity, Target Performance) Time Frame Status Date/  Initial   06/06/2016   Patient will demonstrate independence in prescribed HEP with proper form, sets and reps for safe discharge to an independent program.  12 sessions Initial Eval     06/06/2016   Improve Lower Extremity Functional Score (LEFS) to 60% to exceed Minimal Detectable Change  (MDC) of 9 points.     12 sessions Initial Eval    06/06/2016   Increase R LE strength to at least 4+/5 to allow patient to ambulate without pain for up to 45 minutes  12 sessions Initial Eval    06/06/2016   Increase single leg stance to 5 seconds so patient can negotiate curbs and uneven surfaces safely.  12 sessions Initial Eval    06/06/2016   Improve  R posterior leg flexibility (HS = -30 degs) to reduce radicular pain to above knee only and reduce occurrence by 50%  12 sessions Initial Eval      Signature: [name]. [place], PT, DPT [place] # [phone]  Date: 06/06/2016    Signature: Leveda Anna, MD ___________________________ Date: ____________    [name]  MRN: [id number]

## 2016-06-18 ENCOUNTER — Inpatient Hospital Stay: Payer: Medicare Other | Admitting: Rehabilitative and Restorative Service Providers"

## 2016-06-18 DIAGNOSIS — M25551 Pain in right hip: Secondary | ICD-10-CM

## 2016-06-18 NOTE — PT/OT Therapy Note (Signed)
DAILY NOTE  06/18/2016       Total Treatment (Billable) Time: 40  Total Timed Minutes: 40 Visit Number:  4    Certification Status Ends: 09-03-16   G-Codes required on visit # 10  KX required on visit: 17    Payor: MEDICARE / Plan: MEDICARE PART A AND B / Product Type: *No Product type* /   # of Authorized Visits: 12 Visit #: 4    Diagnosis (Treating/Medical):     ICD-10-CM    1. Right hip pain M25.551            Subjective:  Tanga's pain is Constant/continuous and is rated a 4/10 in sacrum.  Functional Status: Patient states that she's made an appt to see Dr. Karie Mainland next Tuesday for a possible injection.  She states that since [facility] she hasn't noticed improvements.  Patient was packing to prepare to move this weekend although her children were home to help.  She states that sometimes she stands and other times she sits.  She bends forward at the waist intermittently and other times uses her legs to squat.    Objective:   Treatment:  Therapeutic Exercise: to improve: Flexibility/ROM, Stabilization and Strength    Warm-up on combo x 6' with subjective obtained to assist with today's treatment session  Modifications/Patient Education: Reduced therex to focus on manual therapy Verbal, Visual and Tactile cues for therex form; Patient is given 1/4 in heel lift for trial- advised to wean into use (1-2 hrs of wear at a time).    Manual Therapy:   Seated 3 plane sacrum  Leg length measurements and 1/4 in heel slide trial  Grade I-III P-A MOBS to L SI joint and sacrum in sitting  STM to R SI joint region  Seated pubic shotgun MET      Initial Evaluation Reference and/orCurrent Measurements (ROM, Strength, Girth, Outcomes, etc.):   Leg length:   L LE = 1.0 cm shorter     Pelvic asymmetry in sitting    Modalities: None  Therapy Rationale: Other: Patient declined       Assessment (response to treatment):   Patient has increased posterior prominence of L SI joint in sitting after pelvic correction and pubic shotgun.   Patient has to be given verbal instructions to improve symmetrical effort between LE's for MET.  She voices understanding of weaning instructions for heel lift and understands that PT wishes for her to purchase next visit if positive impact is noted.    Progress towards functional goals: NT    Patient requires continued skilled care to: Improve R LE strength and core stabilization for reduced LB/hip pain with prolonged WB    Plan:  Continue with Plan of Care; Assess response to heel lift    [name]. [name], PT, DPT [place] # [phone]  06/18/2016

## 2016-06-20 ENCOUNTER — Inpatient Hospital Stay: Payer: Medicare Other | Admitting: Rehabilitative and Restorative Service Providers"

## 2016-06-20 DIAGNOSIS — M25551 Pain in right hip: Secondary | ICD-10-CM

## 2016-06-20 NOTE — PT/OT Therapy Note (Signed)
DAILY NOTE  06/20/2016       Total Treatment (Billable) Time: 45  Total Timed Minutes: 45 Visit Number:  5    Certification Status Ends: 08/28/16   G-Codes required on visit # 10  KX required on visit: 17    Payor: MEDICARE / Plan: MEDICARE PART A AND B / Product Type: *No Product type* /   # of Authorized Visits: 12 Visit #: 5    Diagnosis (Treating/Medical):     ICD-10-CM    1. Right hip pain M25.551            Subjective:  Darlene Landry's pain is Constant/continuous and is rated a 3/10.  Functional Status: Patient reports her pain is more in the gluteal and down the leg today, not as much in the back.    Objective:   Treatment:  Therapeutic Exercise: to improve: Flexibility/ROM, Stabilization and Strength    Warm-up on combo x 6' with subjective obtained to assist with today's treatment session  Modifications/Patient Education: Initiated bridges and supine resisted clams Verbal, Visual and Tactile cues for therex form    NMR:  Bridges, clams, SLR abd to improve gluteal activation for reduced R leg pain with prolonged standing, walking, and lifting during patient's house moving preparation    Manual Therapy:   STM to R gluteals, ITB  Manual R HS/gastroc and piriformis stretching     Modalities: Ice Pack 10 min. Location R hip Position Recumbant  Therapy Rationale: Decrease Pain, Decrease Inflammation and Decrease Edema       Assessment (response to treatment):   Patient continues to have R gluteal weakness and reports pain with SLR abd and bridges.  She is advised to discuss mass in R gluteal with MD next week.  She is demonstrating reduced pain since trial with heel lift and patient will purchase for long term use.  She is advised that her pain can be from R gluteal/hip weakness and PT will continue to focus on strengthening.  She does not feel ES today in gluteal region so PT defers and uses only CP.    Progress towards functional goals: NT    Patient requires continued skilled care to: Increase R gluteal strength for  reduced pain with WB activities.    Plan:  Continue with Plan of Care    Darlene Landry. [name], PT, DPT [place] # [phone]  06/20/2016

## 2016-06-20 NOTE — PT/OT Exercise Plan (Signed)
Name: [name]  Referring Physician: Leveda Anna, MD  Diagnosis:    ICD-10-CM    1. Right hip pain M25.551        Precautions: Prior SCI- R sided weakness; B knee replacements  Date of Surgery:    MD Follow-up: None scheduled    701 due date: 08/04/2016        Exercise Flow Sheet    Exercise Specifics July 03, 2016 07/05/16 07/12/2016      G-codes/PN   Rec bike    Combo x 6'  AD Combo x 6'  LMS Combo x 6'  LMS         Supine heel slides-unilateral    NT Supine heel slides  20x5"  LMS Supine heel slides with ball  20 x 5"  Lat trunk rotation with ball  20x  LMS         Supine hip abduction with sliding board    NT S/L SLR hip abd with PT assist  10x  LMS  -----------  Supine hip abd with sliding board  20x5"  LMS S/L R SLR hip abd with PT assist  10x  LMS  -----------  Supine R hip abd with sliding board  20x5"  LMS         S/L clams    NT S/L clams  20x  LMS NT         Bridges with hip add    NT NT Bridges with hip add  10x5"  LMS         Supine resisted clams    NT NT Hooklying clams  RTB  20 x 5"  LMS         Standing 4 way hip    NT NT NT         Tandem stance/ SLS    NT NT NT         Fitter    NT NT NT         Mini Squats/sit to stand    NT NT NT                                   Manual  See note AD See note  LMS See note  LMS         MOC    Ice/ES  AD CP/ES post tx x 15'  LMS CP x 10'  LMS         Home Exercise Program     Issued HEP (see media)  LMS          (Initials = supervised exercise by clinician)    Plan Of Care: Body Mechanics Education, NMR, Proprioceptive Activites, Electrical Stimulation, Instruction in HEP, Ultrasound, Therapeutic Exercise, Therapeutic Activities, Dry Needling, Balance/Gait training, Soft Tissue/Joint Mobilization Grade I-III R hip lateral axis distraction and Other: taping  Frequency/Duration: 2 times a week for 12 sessions. Certification Status Ends: 08/04/2016    Goals:   Goal Status   Date (Body Area, Impairment Goal, Functional   Activity, Target Performance) Time Frame  Status Date/  Initial   06/06/2016   Patient will demonstrate independence in prescribed HEP with proper form, sets and reps for safe discharge to an independent program.  12 sessions Initial Eval     06/06/2016   Improve Lower Extremity Functional Score (LEFS) to 60% to exceed Minimal Detectable Change (MDC) of 9 points.  12 sessions Initial Eval    06/06/2016   Increase R LE strength to at least 4+/5 to allow patient to ambulate without pain for up to 45 minutes  12 sessions Initial Eval    06/06/2016   Increase single leg stance to 5 seconds so patient can negotiate curbs and uneven surfaces safely.  12 sessions Initial Eval    06/06/2016   Improve R posterior leg flexibility (HS = -30 degs) to reduce radicular pain to above knee only and reduce occurrence by 50%  12 sessions Initial Eval      Signature: [name]. [place], PT, DPT [place] # [phone]  Date: 06/06/2016    Signature: Leveda Anna, MD ___________________________ Date: ____________    [name]  MRN: [id number]

## 2016-06-25 ENCOUNTER — Other Ambulatory Visit: Payer: Self-pay | Admitting: Physical Medicine & Rehabilitation

## 2016-06-25 DIAGNOSIS — M5416 Radiculopathy, lumbar region: Secondary | ICD-10-CM

## 2016-06-25 DIAGNOSIS — M48061 Spinal stenosis, lumbar region without neurogenic claudication: Secondary | ICD-10-CM

## 2016-06-26 ENCOUNTER — Ambulatory Visit
Admission: RE | Admit: 2016-06-26 | Discharge: 2016-06-26 | Disposition: A | Discharge status: Home / Self Care | Payer: Medicare Other | Source: Ambulatory Visit | Attending: Physical Medicine & Rehabilitation | Admitting: Physical Medicine & Rehabilitation

## 2016-06-26 ENCOUNTER — Inpatient Hospital Stay: Payer: Medicare Other

## 2016-06-26 DIAGNOSIS — M419 Scoliosis, unspecified: Secondary | ICD-10-CM | POA: Insufficient documentation

## 2016-06-26 DIAGNOSIS — M48061 Spinal stenosis, lumbar region without neurogenic claudication: Secondary | ICD-10-CM

## 2016-06-26 DIAGNOSIS — M4806 Spinal stenosis, lumbar region: Secondary | ICD-10-CM | POA: Insufficient documentation

## 2016-06-26 DIAGNOSIS — M5416 Radiculopathy, lumbar region: Secondary | ICD-10-CM

## 2016-06-26 DIAGNOSIS — M4726 Other spondylosis with radiculopathy, lumbar region: Secondary | ICD-10-CM | POA: Insufficient documentation

## 2016-06-26 DIAGNOSIS — M5136 Other intervertebral disc degeneration, lumbar region: Secondary | ICD-10-CM | POA: Insufficient documentation

## 2016-06-26 DIAGNOSIS — M5126 Other intervertebral disc displacement, lumbar region: Secondary | ICD-10-CM | POA: Insufficient documentation

## 2016-06-28 ENCOUNTER — Inpatient Hospital Stay: Payer: Medicare Other

## 2016-07-01 ENCOUNTER — Inpatient Hospital Stay: Payer: Medicare Other | Admitting: Rehabilitative and Restorative Service Providers"

## 2016-07-03 ENCOUNTER — Inpatient Hospital Stay: Payer: Medicare Other

## 2016-07-09 ENCOUNTER — Inpatient Hospital Stay: Payer: Medicare Other | Admitting: Rehabilitative and Restorative Service Providers"

## 2016-07-11 ENCOUNTER — Inpatient Hospital Stay: Payer: Medicare Other

## 2016-07-16 ENCOUNTER — Inpatient Hospital Stay: Payer: Medicare Other | Admitting: Rehabilitative and Restorative Service Providers"

## 2016-07-18 ENCOUNTER — Inpatient Hospital Stay: Payer: Medicare Other | Admitting: Rehabilitative and Restorative Service Providers"

## 2016-11-08 DIAGNOSIS — M48062 Spinal stenosis, lumbar region with neurogenic claudication: Secondary | ICD-10-CM | POA: Insufficient documentation

## 2016-11-12 ENCOUNTER — Encounter: Payer: Self-pay | Admitting: Emergency Medicine

## 2016-11-12 ENCOUNTER — Ambulatory Visit
Admit: 2016-11-12 | Discharge: 2016-11-12 | Disposition: A | Discharge status: Home / Self Care | Payer: Medicare Other | Attending: Family Medicine | Admitting: Family Medicine

## 2016-11-12 ENCOUNTER — Ambulatory Visit
Admission: EM | Admit: 2016-11-12 | Discharge: 2016-11-12 | Disposition: A | Discharge status: Home / Self Care | Payer: Medicare Other | Attending: Family Medicine | Admitting: Family Medicine

## 2016-11-12 DIAGNOSIS — K8020 Calculus of gallbladder without cholecystitis without obstruction: Secondary | ICD-10-CM

## 2016-11-12 DIAGNOSIS — G8929 Other chronic pain: Secondary | ICD-10-CM

## 2016-11-12 DIAGNOSIS — K760 Fatty (change of) liver, not elsewhere classified: Secondary | ICD-10-CM | POA: Diagnosis not present

## 2016-11-12 DIAGNOSIS — R1010 Upper abdominal pain, unspecified: Secondary | ICD-10-CM

## 2016-11-12 DIAGNOSIS — M549 Dorsalgia, unspecified: Secondary | ICD-10-CM

## 2016-11-12 DIAGNOSIS — R1011 Right upper quadrant pain: Secondary | ICD-10-CM | POA: Diagnosis present

## 2016-11-12 DIAGNOSIS — T782XXD Anaphylactic shock, unspecified, subsequent encounter: Secondary | ICD-10-CM | POA: Diagnosis not present

## 2016-11-12 DIAGNOSIS — R600 Localized edema: Secondary | ICD-10-CM | POA: Diagnosis not present

## 2016-11-12 HISTORY — DX: Essential (primary) hypertension: I10

## 2016-11-12 HISTORY — DX: Unspecified osteoarthritis, unspecified site: M19.90

## 2016-11-12 LAB — COMPREHENSIVE METABOLIC PANEL
ALK PHOS: 41 U/L (ref 38–126)
ALT: 19 U/L (ref 14–54)
AST: 22 U/L (ref 15–41)
Albumin: 3.9 g/dL (ref 3.5–5.0)
Anion gap: 6 (ref 5–15)
BUN: 25 mg/dL — ABNORMAL HIGH (ref 6–20)
CALCIUM: 9 mg/dL (ref 8.9–10.3)
CHLORIDE: 103 mmol/L (ref 101–111)
CO2: 26 mmol/L (ref 22–32)
CREATININE: 0.67 mg/dL (ref 0.44–1.00)
GFR calc Af Amer: >60 mL/min (ref >60)
GFR calc non Af Amer: >60 mL/min (ref >60)
GLUCOSE: 90 mg/dL (ref 65–99)
Potassium: 4.5 mmol/L (ref 3.5–5.1)
SODIUM: 135 mmol/L (ref 135–145)
Total Bilirubin: 0.5 mg/dL (ref 0.3–1.2)
Total Protein: 7 g/dL (ref 6.5–8.1)

## 2016-11-12 LAB — LIPASE, BLOOD: LIPASE: 17 U/L (ref 11–51)

## 2016-11-12 LAB — CBC WITH DIFFERENTIAL/PLATELET
BASOS ABS: 0.1 10*3/uL (ref 0–0.1)
Basophils Relative: 1 %
Eosinophils Absolute: 0.3 10*3/uL (ref 0–0.7)
Eosinophils Relative: 5 %
HEMATOCRIT: 37.8 % (ref 35.0–47.0)
HEMOGLOBIN: 12.5 g/dL (ref 12.0–16.0)
LYMPHS PCT: 24 %
Lymphs Abs: 1.6 10*3/uL (ref 1.0–3.6)
MCH: 30.3 pg (ref 26.0–34.0)
MCHC: 33.1 g/dL (ref 32.0–36.0)
MCV: 91.7 fL (ref 80.0–100.0)
MONO ABS: 0.4 10*3/uL (ref 0.2–0.9)
Monocytes Relative: 6 %
Neutro Abs: 4.1 10*3/uL (ref 1.4–6.5)
Neutrophils Relative %: 64 %
Platelets: 230 10*3/uL (ref 150–440)
RBC: 4.13 MIL/uL (ref 3.80–5.20)
RDW: 13.5 % (ref 11.5–14.5)
WBC: 6.4 10*3/uL (ref 3.6–11.0)

## 2016-11-12 LAB — URINALYSIS, COMPLETE (UACMP) WITH MICROSCOPIC
BACTERIA UA: NONE SEEN
Bilirubin Urine: NEGATIVE
Glucose, UA: NEGATIVE mg/dL
Hgb urine dipstick: NEGATIVE
KETONES UR: NEGATIVE mg/dL
Leukocytes, UA: NEGATIVE
Nitrite: NEGATIVE
PH: 5 (ref 5.0–8.0)
Protein, ur: NEGATIVE mg/dL
RBC / HPF: NONE SEEN RBC/hpf (ref 0–5)

## 2016-11-12 LAB — AMYLASE: Amylase: 43 U/L (ref 28–100)

## 2016-11-12 MED ORDER — SUCRALFATE 1 G PO TABS: 2.0000 g | ORAL_TABLET | Freq: Two times a day (BID) | ORAL | 0 refills | Status: DC

## 2016-11-12 MED ORDER — OXYCODONE-ACETAMINOPHEN 5-325 MG PO TABS: 1.0000 | ORAL_TABLET | Freq: Three times a day (TID) | ORAL | 0 refills | Status: DC | PRN

## 2016-11-12 MED ORDER — ONDANSETRON 8 MG PO TBDP: 8.0000 mg | ORAL_TABLET | Freq: Three times a day (TID) | ORAL | 0 refills | Status: DC | PRN

## 2016-11-12 NOTE — ED Notes (Signed)
[facility]

## 2016-11-12 NOTE — ED Triage Notes (Signed)
Patient c/o right sided upper abdominal pain that radiates to her back for the past 2 weeks.  Patient denies fevers.

## 2016-11-12 NOTE — ED Provider Notes (Signed)
[facility]    CSN: [id number] Arrival date & time: 11/12/16  1012     History   Chief Complaint Chief Complaint  Patient presents with  . Abdominal Pain    HPI Evelyn Cisneros is a 76 y.o. female.   Patient is here because of multiple problems and issues. The first problem is she's had right upper abdominal pain and pain in the right upper quadrant that goes to her back. This pain has been going on for about 2 weeks. The pain is described as comes and goes as not associated with food or eating the last time she ate was about 7:30 this morning. Along with this abdominal pain she's had some nausea. She's never had any abdominal surgeries other than hysterectomy. Next problem she reports difficulty voiding she reports that she is not voiding like she should be she's been drinking a lot of fluids especially since she is afraid to eat with his abdominal pain below she doesn't link the abdominal pain with eating. She also reports along with not being able to go to the bathroom increased swelling of her legs as well. Should be noted that patient has a history of intolerance to NSAIDs and which she takes NSAIDs she swells so she doesn't take Motrin or Aleve. Not even though she doesn't take NSAIDs and later turns out that she is on Excedrin which explained to her has acetaminophen in she's been taking due to the move from [place] to [place] extra strength Excedrin 3 or 4 times a day for her back pain. Also turns out she's been started on tramadol for the back pain which she'll have more discussion and more information about and that she has been taking Voltaren for number of years without swelling. Apparently she can take NSAIDs but at a low dose and the Voltaren has not been a problem sounds as the Voltaren with the extra strength Excedrin is now causing a problem. She has her lab work from December in [place] and in reviewing her lab work her creatinine was 0.5 and BUN was normal.  The rest for lab work from that time [place] was also within normal parameters. She also has a problem with chronic pain in she's been seen at [facility] orthopedics service. Apparently several years ago she fell down steps at home with a subsequent fracture of the cervical spine. She had to have cervical table as a station. She states this was paralyzed initially after she fell and she will then was able to gain some mobility was in a wheelchair and now she's finally able to walk. She's has developed recently tingling and numbness in her right hand she has a splint on her right hand and she is seeing [facility] because she has spinal stenosis of C3 and C4. They do not want to do surgery on her at this time and the plans are for her to have under fluoroscopy steroid injections to see if this improved and that has worked she'll require surgery other surgeries include replacement of both knees bilateral and spinal surgery as well. It shows a also she takes Lasix on a regular basis as well. She is also been recently placed on tramadol for the back pain but that has not helped she does admit that the last few nights she's taken of few Percocet tablets that she had left over from her spinal surgery when she had surgery in [place]. She does not smoke she's never smoked. No pertinent family medical  history relevant to today's visit.    The history is provided by the patient and the spouse.  Abdominal Pain  Pain location:  RUQ Pain quality: aching, cramping and sharp   Pain radiates to:  RUQ and R flank Pain severity:  Moderate Onset quality:  Sudden Timing:  Constant Progression:  Worsening Chronicity:  New Context: not eating, not medication withdrawal and not sick contacts   Relieved by:  Nothing Worsened by:  Nothing Ineffective treatments:  Liquids, NSAIDs, acetaminophen and OTC medications (percocet , tramadol) Associated symptoms: nausea   Associated symptoms: no fever and no flatus     Associated symptoms comment:  Urinary retention and hesitancy Risk factors: aspirin, being elderly and NSAID use   Risk factors: not obese     Past Medical History:  Diagnosis Date  . Arthritis   . Hypertension     There are no active problems to display for this patient.   Past Surgical History:  Procedure Laterality Date  . ABDOMINAL HYSTERECTOMY    . REPLACEMENT TOTAL KNEE BILATERAL    . SPINE SURGERY      OB History    No data available       Home Medications    Prior to Admission medications   Medication Sig Start Date End Date Taking? Authorizing Provider  aspirin EC 81 MG tablet Take 81 mg by mouth daily.   Yes Historical Provider, MD  atorvastatin (LIPITOR) 40 MG tablet Take 40 mg by mouth daily.   Yes Historical Provider, MD  b complex vitamins tablet Take 1 tablet by mouth daily.   Yes Historical Provider, MD  Biotin 1000 MCG CHEW Chew by mouth.   Yes Historical Provider, MD  Cholecalciferol (VITAMIN D-3) 5000 units TABS Take by mouth daily.   Yes Historical Provider, MD  Coenzyme Q10 (COQ10) 200 MG CAPS Take by mouth daily.   Yes Historical Provider, MD  Diclofenac-Misoprostol (ARTHROTEC) 75-0.2 MG TBEC Take by mouth every other day.   Yes Historical Provider, MD  docusate sodium (COLACE) 100 MG capsule Take 100 mg by mouth daily.   Yes Historical Provider, MD  Furosemide (LASIX PO) Take 10 mg by mouth daily.   Yes Historical Provider, MD  gabapentin (NEURONTIN) 300 MG capsule Take 300 mg by mouth 3 (three) times daily.   Yes Historical Provider, MD  levothyroxine (SYNTHROID, LEVOTHROID) 75 MCG tablet Take 75 mcg by mouth daily before breakfast.   Yes Historical Provider, MD  losartan (COZAAR) 100 MG tablet Take 100 mg by mouth daily.   Yes Historical Provider, MD  Magnesium 250 MG TABS Take by mouth.   Yes Historical Provider, MD  Multiple Vitamins-Minerals (PRESERVISION/LUTEIN) CAPS Take by mouth.   Yes Historical Provider, MD  PARoxetine (PAXIL) 10 MG  tablet Take 10 mg by mouth daily.   Yes Historical Provider, MD  polycarbophil (FIBERCON) 625 MG tablet Take 625 mg by mouth daily.   Yes Historical Provider, MD  tizanidine (ZANAFLEX) 2 MG capsule Take 2 mg by mouth 2 (two) times daily.   Yes Historical Provider, MD  traMADol (ULTRAM) 50 MG tablet Take 50 mg by mouth daily.   Yes Historical Provider, MD  ondansetron (ZOFRAN ODT) 8 MG disintegrating tablet Take 1 tablet (8 mg total) by mouth every 8 (eight) hours as needed for nausea or vomiting. 11/12/16   Hassan Rowan, MD  oxyCODONE-acetaminophen (PERCOCET/ROXICET) 5-325 MG tablet Take 1 tablet by mouth every 8 (eight) hours as needed for severe pain. 11/12/16   Dennard Nip  Thurmond ButtsWade, MD  sucralfate (CARAFATE) 1 g tablet Take 2 tablets (2 g total) by mouth 2 (two) times daily. An hour before eating. 11/12/16   Hassan RowanEugene Veanna Dower, MD    Family History History reviewed. No pertinent family history.  Social History Social History  Substance Use Topics  . Smoking status: Never Smoker  . Smokeless tobacco: Never Used  . Alcohol use No     Allergies   Patient has no known allergies.   Review of Systems Review of Systems  Constitutional: Positive for activity change. Negative for fever.  HENT: Negative for rhinorrhea.   Respiratory: Negative.   Gastrointestinal: Positive for abdominal pain and nausea. Negative for flatus.  Genitourinary: Positive for difficulty urinating.  Musculoskeletal: Positive for back pain and myalgias.  Skin: Negative for color change, pallor and rash.  Neurological: Positive for weakness.  All other systems reviewed and are negative.    Physical Exam Triage Vital Signs ED Triage Vitals  Enc Vitals Group     BP 11/12/16 1118 (!) 121/98     Pulse Rate 11/12/16 1118 81     Resp 11/12/16 1118 16     Temp 11/12/16 1118 97.7 F (36.5 C)     Temp Source 11/12/16 1118 Oral     SpO2 11/12/16 1118 96 %     Weight 11/12/16 1117 192 lb (87.1 kg)     Height 11/12/16 1117 5\' 1"   (1.549 m)     Head Circumference --      Peak Flow --      Pain Score 11/12/16 1121 6     Pain Loc --      Pain Edu? --      Excl. in GC? --    No data found.   Updated Vital Signs BP (!) 121/98 (BP Location: Left Arm)   Pulse 81   Temp 97.7 F (36.5 C) (Oral)   Resp 16   Ht 5\' 1"  (1.549 m)   Wt 192 lb (87.1 kg)   SpO2 96%   BMI 36.28 kg/m   Visual Acuity Right Eye Distance:   Left Eye Distance:   Bilateral Distance:    Right Eye Near:   Left Eye Near:    Bilateral Near:     Physical Exam  Constitutional: She is oriented to person, place, and time. She is active.  Non-toxic appearance. She has a sickly appearance. She does not appear ill. No distress.  HENT:  Head: Normocephalic and atraumatic.  Eyes: Pupils are equal, round, and reactive to light.  Neck: Normal range of motion. Neck supple.  Cardiovascular: Normal rate and regular rhythm.   Pulmonary/Chest: Effort normal and breath sounds normal.  Abdominal: Soft. Normal appearance. There is no hepatosplenomegaly. There is tenderness. There is CVA tenderness and positive Murphy's sign. No hernia.    Patient with tenderness in the right upper quadrant no tenderness in the right CVA area. Bowel sounds are present  Musculoskeletal: Normal range of motion. She exhibits edema. She exhibits no tenderness.  Neurological: She is alert and oriented to person, place, and time.  Skin: Skin is warm.  Psychiatric: She has a normal mood and affect.  Vitals reviewed.    UC Treatments / Results  Labs (all labs ordered are listed, but only abnormal results are displayed) Labs Reviewed  COMPREHENSIVE METABOLIC PANEL - Abnormal; Notable for the following:       Result Value   BUN 25 (*)    All other components within normal limits  URINALYSIS, COMPLETE (UACMP) WITH MICROSCOPIC - Abnormal; Notable for the following:    Specific Gravity, Urine <1.005 (*)    Squamous Epithelial / LPF 0-5 (*)    All other components within  normal limits  URINE CULTURE  CBC WITH DIFFERENTIAL/PLATELET  AMYLASE  LIPASE, BLOOD    EKG  EKG Interpretation None       Radiology US Abdomen Limited Ruq  Result Date: 11/12/2016 CLINICAL DATA:  Right upper quadrant abdominal pain for 2 weeks. EXAM: US ABDOMEN LIMITED - RIGHT UPPER QUADRANT COMPARISON:  None. FINDINGS: Gallbladder: A few small mobile stones measuring up to 0.7 cm are identified in the gallbladder. No wall thickening or pericholecystic fluid. Sonographer reports negative Murphy's sign. Common bile duct: Diameter: 0.4 cm. Liver: Echogenicity is somewhat increased consistent with fatty infiltration. No focal lesion or intrahepatic biliary ductal dilatation. IMPRESSION: A few small mobile gallstones are seen. No evidence of cholecystitis. Fatty infiltration of the liver. Electronically Signed   By: Drusilla Kanner M.D.   On: 11/12/2016 13:45    Procedures Procedures (including critical care time)  Medications Ordered in UC Medications - No data to display  Results for orders placed or performed during the hospital encounter of 11/12/16  CBC with Differential  Result Value Ref Range   WBC 6.4 3.6 - 11.0 K/uL   RBC 4.13 3.80 - 5.20 MIL/uL   Hemoglobin 12.5 12.0 - 16.0 g/dL   HCT 16.1 09.6 - 04.5 %   MCV 91.7 80.0 - 100.0 fL   MCH 30.3 26.0 - 34.0 pg   MCHC 33.1 32.0 - 36.0 g/dL   RDW 40.9 81.1 - 91.4 %   Platelets 230 150 - 440 K/uL   Neutrophils Relative % 64 %   Neutro Abs 4.1 1.4 - 6.5 K/uL   Lymphocytes Relative 24 %   Lymphs Abs 1.6 1.0 - 3.6 K/uL   Monocytes Relative 6 %   Monocytes Absolute 0.4 0.2 - 0.9 K/uL   Eosinophils Relative 5 %   Eosinophils Absolute 0.3 0 - 0.7 K/uL   Basophils Relative 1 %   Basophils Absolute 0.1 0 - 0.1 K/uL  Amylase  Result Value Ref Range   Amylase 43 28 - 100 U/L  Lipase, blood  Result Value Ref Range   Lipase 17 11 - 51 U/L  Comprehensive metabolic panel  Result Value Ref Range   Sodium 135 135 - 145 mmol/L     Potassium 4.5 3.5 - 5.1 mmol/L   Chloride 103 101 - 111 mmol/L   CO2 26 22 - 32 mmol/L   Glucose, Bld 90 65 - 99 mg/dL   BUN 25 (H) 6 - 20 mg/dL   Creatinine, Ser 7.82 0.44 - 1.00 mg/dL   Calcium 9.0 8.9 - 95.6 mg/dL   Total Protein 7.0 6.5 - 8.1 g/dL   Albumin 3.9 3.5 - 5.0 g/dL   AST 22 15 - 41 U/L   ALT 19 14 - 54 U/L   Alkaline Phosphatase 41 38 - 126 U/L   Total Bilirubin 0.5 0.3 - 1.2 mg/dL   GFR calc non Af Amer >60 >60 mL/min   GFR calc Af Amer >60 >60 mL/min   Anion gap 6 5 - 15  Urinalysis, Complete w Microscopic  Result Value Ref Range   Color, Urine YELLOW YELLOW   APPearance CLEAR CLEAR   Specific Gravity, Urine <1.005 (L) 1.005 - 1.030   pH 5.0 5.0 - 8.0   Glucose, UA NEGATIVE NEGATIVE mg/dL  Hgb urine dipstick NEGATIVE NEGATIVE   Bilirubin Urine NEGATIVE NEGATIVE   Ketones, ur NEGATIVE NEGATIVE mg/dL   Protein, ur NEGATIVE NEGATIVE mg/dL   Nitrite NEGATIVE NEGATIVE   Leukocytes, UA NEGATIVE NEGATIVE   Squamous Epithelial / LPF 0-5 (A) NONE SEEN   WBC, UA 0-5 0 - 5 WBC/hpf   RBC / HPF NONE SEEN 0 - 5 RBC/hpf   Bacteria, UA NONE SEEN NONE SEEN   Initial Impression / Assessment and Plan / UC Course  I have reviewed the triage vital signs and the nursing notes.  Pertinent labs & imaging results that were available during my care of the patient were reviewed by me and considered in my medical decision making (see chart for details).     Patient lab work shows a slightly elevated BUN creatinine has gone from 0.5 to about 0.67. Ultrasound of the gallbladder did show signs of gallstones present. Explained to her and her husband feel that the pain she is having his palm becoming from gallstones we talked extensively about a low-fat diet avoiding meats. Recommended Carafate 2 tablets twice a day along with Carafate also I will give her a prescription for some Percocet to use for pain given her limited amount only 15. She did tell me about getting the tramadol from  [facility] no recommend does not take tramadol with the Percocet. Patient from [place] so unable to obtain any information from [place] drug reporting site at this time since Dialysis given last week. Because of the edema I recommend that she avoid the Excedrin this she's tolerated the Voltaren before swallow her to take the Voltaren 75 mg daily. It should be noted that I spent at least 30 minutes face-to-face Serna husband both before the ultrasound after the ultrasound going over lab work test results and other information. Patient promised me she is going to try to obtain a PCP by next week and she was given information she can take to the orthopedic and [facility] if she does not have a PCP before then. Also discussed with the risk and possible to have did have surgery done for the gallstones  Final Clinical Impressions(s) / UC Diagnoses   Final diagnoses:  Gall stones  Idiopathic anaphylactic reaction, subsequent encounter  Edema of both legs  Pain of upper abdomen  Chronic back pain greater than 3 months duration    New Prescriptions Discharge Medication List as of 11/12/2016  2:20 PM    START taking these medications   Details  ondansetron (ZOFRAN ODT) 8 MG disintegrating tablet Take 1 tablet (8 mg total) by mouth every 8 (eight) hours as needed for nausea or vomiting., Starting Tue 11/12/2016, Normal    oxyCODONE-acetaminophen (PERCOCET/ROXICET) 5-325 MG tablet Take 1 tablet by mouth every 8 (eight) hours as needed for severe pain., Starting Tue 11/12/2016, Normal    sucralfate (CARAFATE) 1 g tablet Take 2 tablets (2 g total) by mouth 2 (two) times daily. An hour before eating., Starting Tue 11/12/2016, Normal        Note: This dictation was prepared with Dragon dictation along with smaller phrase technology. Any transcriptional errors that result from this process are unintentional.      Hassan Rowan, MD 11/12/16 1651

## 2016-11-13 ENCOUNTER — Telehealth: Payer: Self-pay

## 2016-11-13 MED ORDER — SUCRALFATE 1 G PO TABS: 2.0000 g | ORAL_TABLET | Freq: Two times a day (BID) | ORAL | 0 refills | Status: DC

## 2016-11-13 NOTE — Telephone Encounter (Signed)
Patient called in today stating that pharmacy never received Rx for Carafate. I have re-sent in the Rx and patient is aware. [facility]

## 2016-11-14 LAB — URINE CULTURE
Culture: NO GROWTH
SPECIAL REQUESTS: NORMAL

## 2016-11-24 DIAGNOSIS — E039 Hypothyroidism, unspecified: Secondary | ICD-10-CM | POA: Insufficient documentation

## 2016-11-24 DIAGNOSIS — K8020 Calculus of gallbladder without cholecystitis without obstruction: Secondary | ICD-10-CM | POA: Insufficient documentation

## 2016-11-24 DIAGNOSIS — R600 Localized edema: Secondary | ICD-10-CM | POA: Insufficient documentation

## 2016-11-24 DIAGNOSIS — M5412 Radiculopathy, cervical region: Secondary | ICD-10-CM | POA: Insufficient documentation

## 2016-11-24 DIAGNOSIS — I10 Essential (primary) hypertension: Secondary | ICD-10-CM | POA: Insufficient documentation

## 2016-11-24 DIAGNOSIS — M5136 Other intervertebral disc degeneration, lumbar region: Secondary | ICD-10-CM | POA: Insufficient documentation

## 2016-11-24 DIAGNOSIS — Z9189 Other specified personal risk factors, not elsewhere classified: Secondary | ICD-10-CM | POA: Insufficient documentation

## 2016-11-24 DIAGNOSIS — E785 Hyperlipidemia, unspecified: Secondary | ICD-10-CM | POA: Insufficient documentation

## 2016-12-19 DIAGNOSIS — R634 Abnormal weight loss: Secondary | ICD-10-CM | POA: Insufficient documentation

## 2017-04-15 IMAGING — US US ABDOMEN LIMITED
1 series · 14 of 25 positions shown · non-contrast
Comparison: None.

CLINICAL DATA: Right upper quadrant abdominal pain for 2 weeks.

EXAM:
US ABDOMEN LIMITED - RIGHT UPPER QUADRANT

[Series 1: us abdomen limited · 0.17mm/px · 14 of 56 slices shown]
[im 1/56]
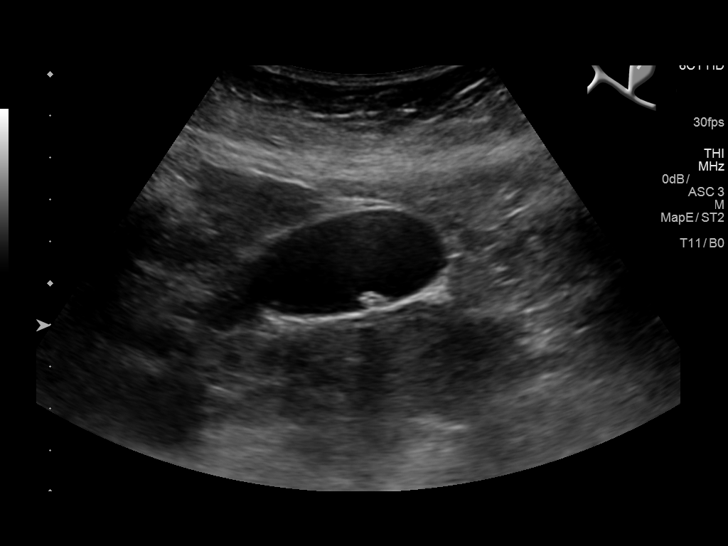
[im 5/56]
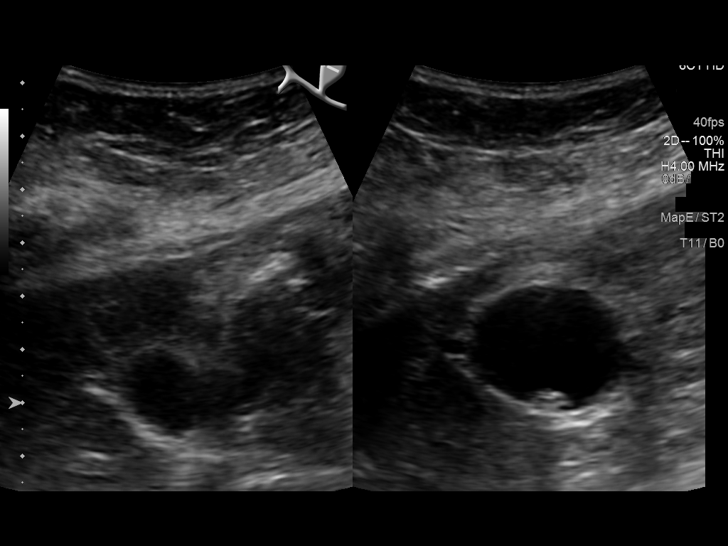
[im 10/56]
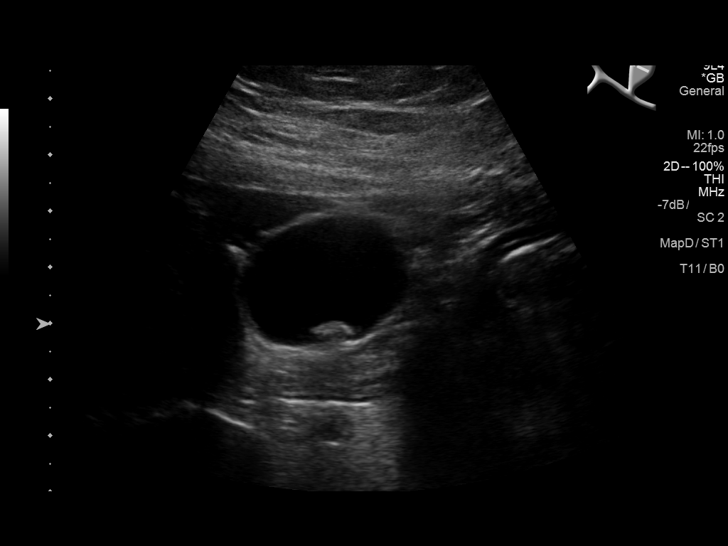
[im 14/56]
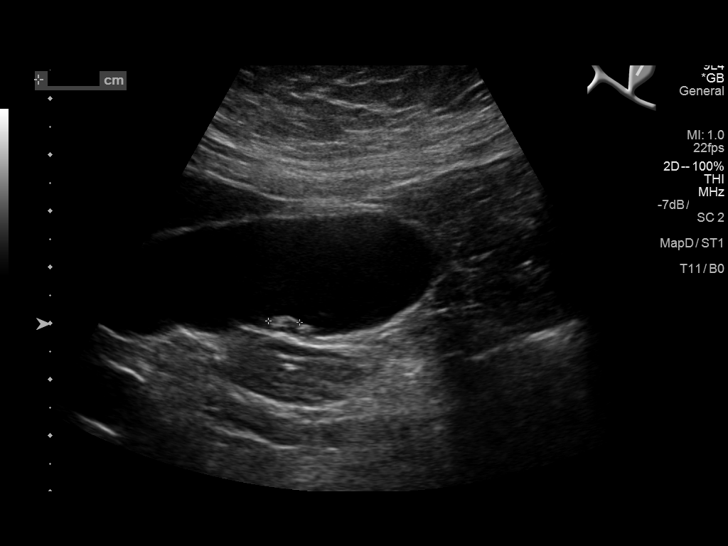
[im 19/56]
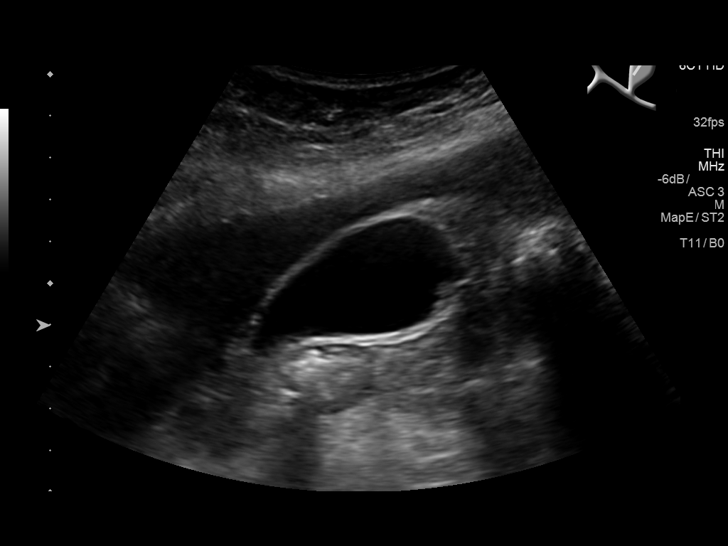
[im 21/56]
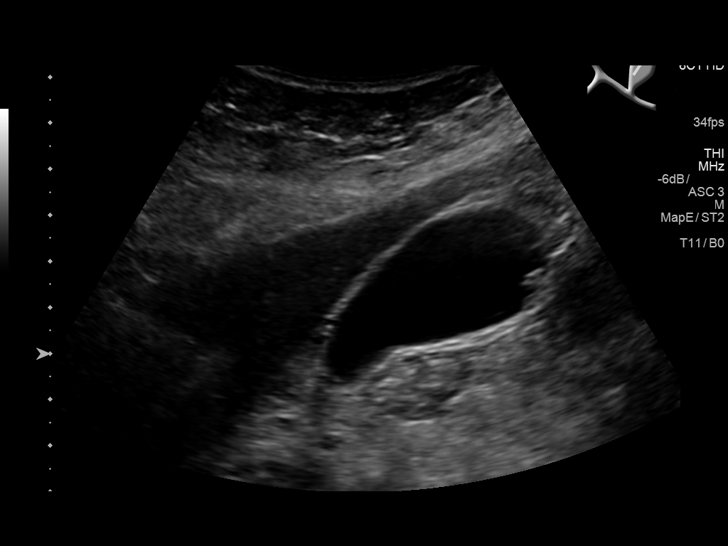
[im 26/56]
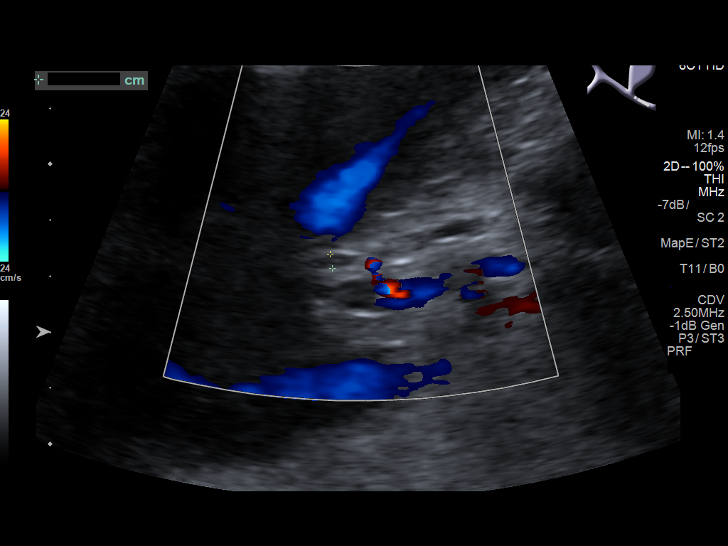
[im 30/56]
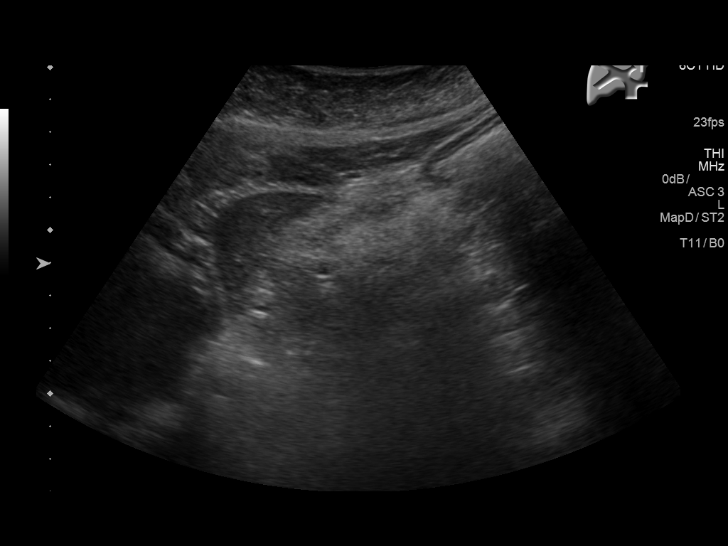
[im 35/56]
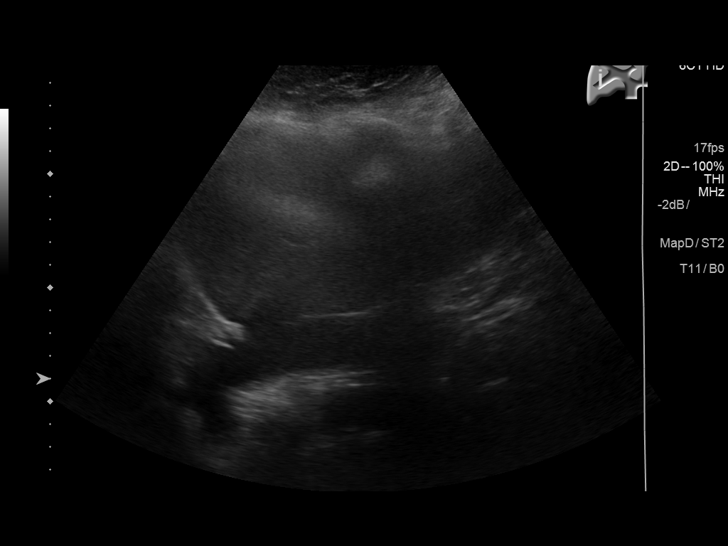
[im 37/56]
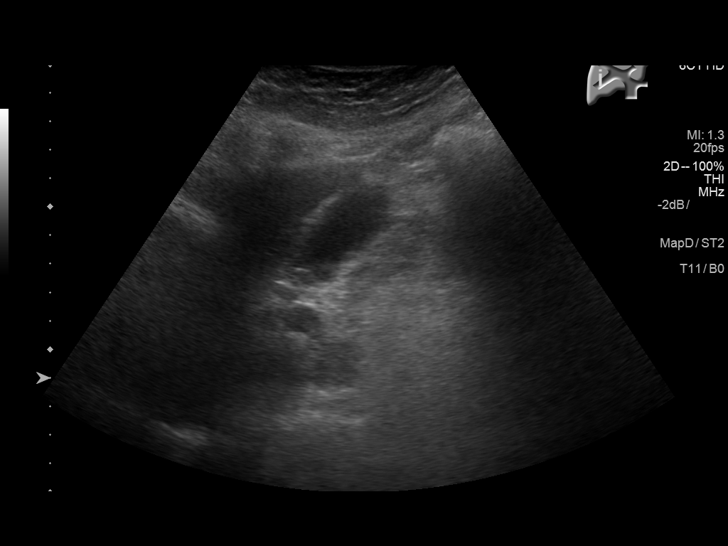
[im 42/56]
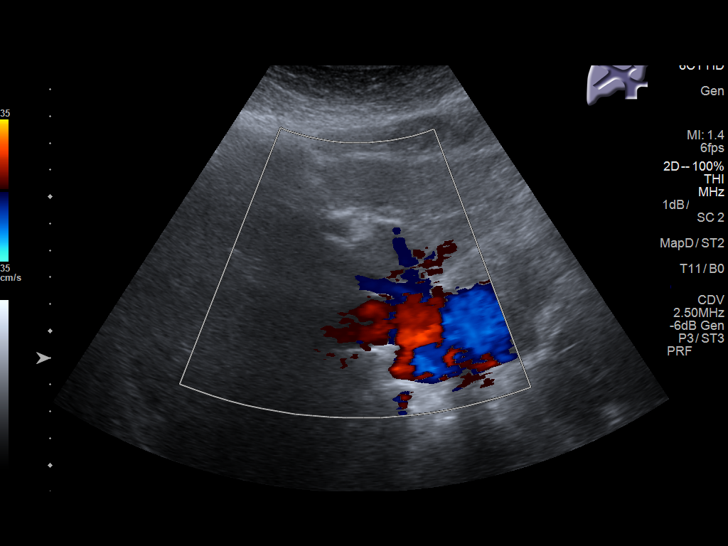
[im 46/56]
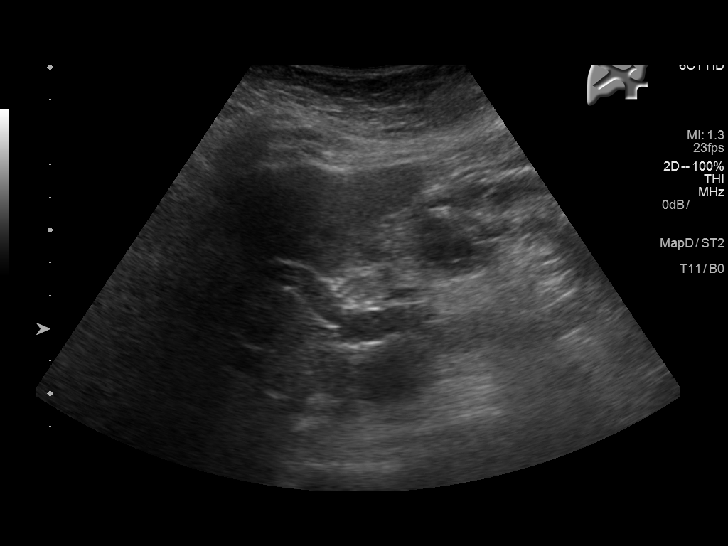
[im 51/56]
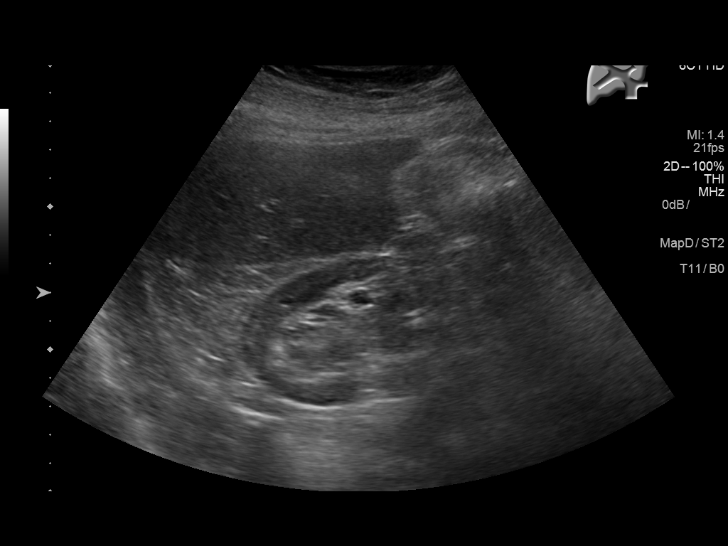
[im 56/56]
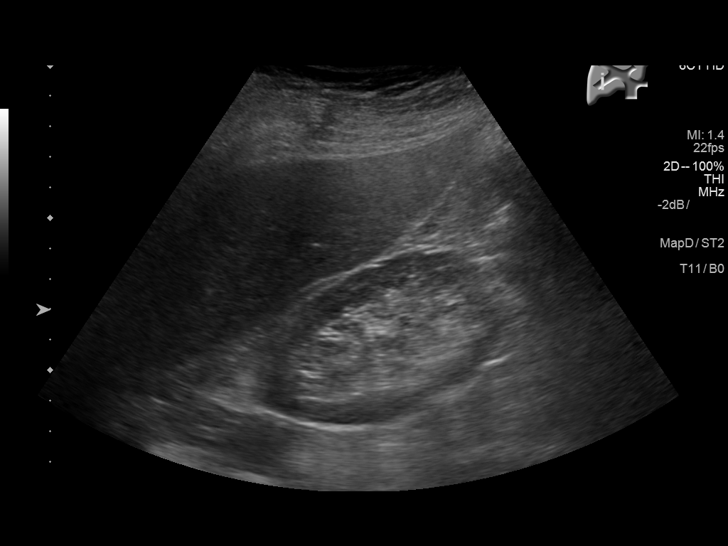

[14 of 25 positions shown; findings below may reference images not displayed]

FINDINGS: Gallbladder:

A few small mobile stones measuring up to 0.7 cm are identified in
the gallbladder. No wall thickening or pericholecystic fluid.
Sonographer reports negative Murphy's sign.

Common bile duct:

Diameter: 0.4 cm.

Liver:

Echogenicity is somewhat increased consistent with fatty
infiltration. No focal lesion or intrahepatic biliary ductal
dilatation.
IMPRESSION: A few small mobile gallstones are seen. No evidence of
cholecystitis.

Fatty infiltration of the liver.

## 2017-04-24 DIAGNOSIS — M62838 Other muscle spasm: Secondary | ICD-10-CM | POA: Insufficient documentation

## 2017-04-24 DIAGNOSIS — S14121A Central cord syndrome at C1 level of cervical spinal cord, initial encounter: Secondary | ICD-10-CM | POA: Insufficient documentation

## 2018-02-12 ENCOUNTER — Encounter: Payer: Self-pay | Admitting: Gastroenterology

## 2018-02-12 ENCOUNTER — Ambulatory Visit (INDEPENDENT_AMBULATORY_CARE_PROVIDER_SITE_OTHER): Payer: Medicare Other | Admitting: Gastroenterology

## 2018-02-12 VITALS — BP 119/80 | HR 80 | Wt 174.0 lb

## 2018-02-12 DIAGNOSIS — M25519 Pain in unspecified shoulder: Secondary | ICD-10-CM | POA: Insufficient documentation

## 2018-02-12 DIAGNOSIS — J309 Allergic rhinitis, unspecified: Secondary | ICD-10-CM | POA: Insufficient documentation

## 2018-02-12 DIAGNOSIS — R55 Syncope and collapse: Secondary | ICD-10-CM | POA: Insufficient documentation

## 2018-02-12 DIAGNOSIS — R7309 Other abnormal glucose: Secondary | ICD-10-CM | POA: Insufficient documentation

## 2018-02-12 DIAGNOSIS — R609 Edema, unspecified: Secondary | ICD-10-CM | POA: Insufficient documentation

## 2018-02-12 DIAGNOSIS — J45909 Unspecified asthma, uncomplicated: Secondary | ICD-10-CM | POA: Insufficient documentation

## 2018-02-12 DIAGNOSIS — E559 Vitamin D deficiency, unspecified: Secondary | ICD-10-CM | POA: Insufficient documentation

## 2018-02-12 DIAGNOSIS — R001 Bradycardia, unspecified: Secondary | ICD-10-CM | POA: Insufficient documentation

## 2018-02-12 DIAGNOSIS — K529 Noninfective gastroenteritis and colitis, unspecified: Secondary | ICD-10-CM

## 2018-02-12 DIAGNOSIS — R635 Abnormal weight gain: Secondary | ICD-10-CM | POA: Insufficient documentation

## 2018-02-12 DIAGNOSIS — E876 Hypokalemia: Secondary | ICD-10-CM | POA: Insufficient documentation

## 2018-02-12 DIAGNOSIS — R011 Cardiac murmur, unspecified: Secondary | ICD-10-CM | POA: Insufficient documentation

## 2018-02-12 DIAGNOSIS — R209 Unspecified disturbances of skin sensation: Secondary | ICD-10-CM | POA: Insufficient documentation

## 2018-02-12 DIAGNOSIS — E7800 Pure hypercholesterolemia, unspecified: Secondary | ICD-10-CM | POA: Insufficient documentation

## 2018-02-12 NOTE — Progress Notes (Signed)
Arlyss Repressohini R Michiko Lineman, MD [address]  [address]  [place], [place] [address]  Main: [phone]  Fax: [phone]    Gastroenterology Consultation  Referring Provider:     Verner MouldFowler, Vickie A, MD Primary Care Physician:  Wendie SimmerFowler, Vickie Primary Gastroenterologist:  Dr. Arlyss Repressohini R Skiler Olden Reason for Consultation:  Chronic diarrhea        HPI:   Sheral ApleyLois Zecca is a 77 y.o. female referred by Dr. Wendie SimmerVickie Fowler for consultation & management of increased bowel frequency. Patient reports that she started experiencing nonbloody diarrhea, predominantly postprandial urgency that started approximately in 08/2017. She reports that every time she eats, she has to rush to the bathroom, sometimes up to 4-5 times. She also wakes up at night for 1-2 times. She denies incontinence, leakage. She reports the stools are watery, nonbloody. She takes Imodium as needed which does control diarrhea. Prior to this, she reports having regular bowel movements. She is trying to lose weight. She denies loss of appetite. She denies abdominal cramps, nausea, vomiting, bloating or distention. She denies constipation. She is watching her diet and has been been avoiding fatty foods, lettuce. And she does take quite a bit of supplements. She denies consuming carbonated beverages, artificial sweeteners She does eat red meat up to 3 times a week. She does have chronic back pain issues and [facility]. She is requesting a referral to another neurosurgeon for a second opinion because surgery is quite extensive.   Workup thus far included by her primary care doctor, stool studies performed which were negative for bacterial cultures, C. Difficile, Giardia antigen negative, cryptosporidium negative. Celiac serologies were negative. He does not have anemia. She denies recent travel outside the [place], drinking well water, use of antibiotics, sick contacts or illness  NSAIDs: none  Antiplts/Anticoagulants/Anti  thrombotics: aspirin 81  GI Procedures: She had a colonoscopy at [facility] in 12/2016 for rectal bleeding and she was told that there were no polyps. On to have hemorrhoids. Actual procedure report is not available  Past Medical History:  Diagnosis Date  . Arthritis   . Hypertension     Past Surgical History:  Procedure Laterality Date  . ABDOMINAL HYSTERECTOMY    . REPLACEMENT TOTAL KNEE BILATERAL    . SPINE SURGERY       Current Outpatient Medications:  .  aspirin EC 81 MG tablet, Take 81 mg by mouth daily., Disp: , Rfl:  .  atorvastatin (LIPITOR) 40 MG tablet, Take 40 mg by mouth daily., Disp: , Rfl:  .  b complex vitamins tablet, Take 1 tablet by mouth daily., Disp: , Rfl:  .  Biotin 1000 MCG CHEW, Chew by mouth., Disp: , Rfl:  .  Cholecalciferol (VITAMIN D-3) 5000 units TABS, Take by mouth daily., Disp: , Rfl:  .  Coenzyme Q10 (COQ10) 200 MG CAPS, Take by mouth daily., Disp: , Rfl:  .  Diclofenac-Misoprostol (ARTHROTEC) 75-0.2 MG TBEC, Take by mouth every other day., Disp: , Rfl:  .  docusate sodium (COLACE) 100 MG capsule, Take 100 mg by mouth daily., Disp: , Rfl:  .  Furosemide (LASIX PO), Take 10 mg by mouth daily., Disp: , Rfl:  .  gabapentin (NEURONTIN) 300 MG capsule, Take 300 mg by mouth 3 (three) times daily., Disp: , Rfl:  .  levothyroxine (SYNTHROID, LEVOTHROID) 75 MCG tablet, Take 75 mcg by mouth daily before breakfast., Disp: , Rfl:  .  losartan (COZAAR) 100 MG tablet, Take 100 mg by mouth  daily., Disp: , Rfl:  .  Magnesium 250 MG TABS, Take by mouth., Disp: , Rfl:  .  Multiple Vitamins-Minerals (PRESERVISION/LUTEIN) CAPS, Take by mouth., Disp: , Rfl:  .  ondansetron (ZOFRAN ODT) 8 MG disintegrating tablet, Take 1 tablet (8 mg total) by mouth every 8 (eight) hours as needed for nausea or vomiting., Disp: 20 tablet, Rfl: 0 .  oxyCODONE-acetaminophen (PERCOCET/ROXICET) 5-325 MG tablet, Take 1 tablet by mouth every 8 (eight) hours as needed for severe pain., Disp: 15 tablet,  Rfl: 0 .  PARoxetine (PAXIL) 10 MG tablet, Take 10 mg by mouth daily., Disp: , Rfl:  .  polycarbophil (FIBERCON) 625 MG tablet, Take 625 mg by mouth daily., Disp: , Rfl:  .  sucralfate (CARAFATE) 1 g tablet, Take 2 tablets (2 g total) by mouth 2 (two) times daily. An hour before eating., Disp: 120 tablet, Rfl: 0 .  tizanidine (ZANAFLEX) 2 MG capsule, Take 2 mg by mouth 2 (two) times daily., Disp: , Rfl:  .  traMADol (ULTRAM) 50 MG tablet, Take 50 mg by mouth daily., Disp: , Rfl:    No family history on file.   Social History   Tobacco Use  . Smoking status: Never Smoker  . Smokeless tobacco: Never Used  Substance Use Topics  . Alcohol use: No  . Drug use: No    Allergies as of 02/12/2018 - Review Complete 11/12/2016  Allergen Reaction Noted  . Pineapple Hives and Nausea And Vomiting 11/08/2016  . Atenolol Other (See Comments) 02/12/2018  . Tizanidine Other (See Comments) 11/15/2016  . Valsartan  02/12/2018  . Verapamil  02/12/2018    Review of Systems:    All systems reviewed and negative except where noted in HPI.   Physical Exam:  There were no vitals taken for this visit. No LMP recorded. Patient has had a hysterectomy.  General:   Alert,  Well-developed, well-nourished, pleasant and cooperative in NAD Head:  Normocephalic and atraumatic. Eyes:  Sclera clear, no icterus.   Conjunctiva pink. Ears:  Normal auditory acuity. Nose:  No deformity, discharge, or lesions. Mouth:  No deformity or lesions,oropharynx pink & moist. Neck:  Supple; no masses or thyromegaly. Lungs:  Respirations even and unlabored.  Clear throughout to auscultation.   No wheezes, crackles, or rhonchi. No acute distress. Heart:  Regular rate and rhythm; no murmurs, clicks, rubs, or gallops. Abdomen:  Normal bowel sounds. Soft, obese, non-tender and non-distended without masses, hepatosplenomegaly or hernias noted.  No guarding or rebound tenderness.   Rectal: Not performed Msk:  Symmetrical without  gross deformities. Good, equal movement & strength bilaterally. Pulses:  Normal pulses noted. Extremities:  No clubbing or edema.  No cyanosis. muscle wasting in her hands Neurologic:  Alert and oriented x3;  grossly normal neurologically. Skin:  Intact without significant lesions, she has ecchymosis in her upper extremities. No jaundice. Psych:  Alert and cooperative. Normal mood and affect.  Imaging Studies: Ultrasound in 10/2016 revealed fatty liver and cholelithiasis  Assessment and Plan:   Bindi Klomp is a 77 y.o. Caucasian female with morbid obesity, hypertension presents with approximately 6 months history of chronic nonbloody diarrhea without evidence of anemia or weight loss. Differentials include microscopic colitis or diarrhea predominant irritable bowel syndrome or small intestinal bacterial overgrowth or Pancreatic insufficiency  - Advised her to stop all the supplements as there is no indication, she wants to continue CoQ10, vitamin D - Discussed with her about low FODMAPs diet - Trial of probiotics VSL #3 -  Avoid red meat - Imodium daily - if her diarrhea persists despite above, we will perform colonoscopy with biopsies to evaluate for microscopic colitis   Follow up in 4-6weeks   Arlyss Repress, MD

## 2018-03-24 ENCOUNTER — Other Ambulatory Visit: Payer: Self-pay | Admitting: Neurosurgery

## 2018-03-30 ENCOUNTER — Ambulatory Visit: Payer: Medicare Other | Attending: Family Medicine | Admitting: Occupational Therapy

## 2018-03-30 ENCOUNTER — Other Ambulatory Visit: Payer: Self-pay

## 2018-03-30 ENCOUNTER — Encounter: Payer: Self-pay | Admitting: Occupational Therapy

## 2018-03-30 DIAGNOSIS — I890 Lymphedema, not elsewhere classified: Secondary | ICD-10-CM | POA: Diagnosis not present

## 2018-03-30 NOTE — Patient Instructions (Signed)

## 2018-03-31 ENCOUNTER — Ambulatory Visit: Payer: Medicare Other | Admitting: Occupational Therapy

## 2018-03-31 DIAGNOSIS — I890 Lymphedema, not elsewhere classified: Secondary | ICD-10-CM

## 2018-03-31 NOTE — Therapy (Signed)
[facility] MAIN [facility] SERVICES [address] [place], [place], [address] Phone: [phone]   Fax:  [phone]  Occupational Therapy Evaluation: Lymphedema Care  Patient Details  Name: Evelyn Cisneros MRN: [id number] Date of Birth: [birth year] Referring Provider: Verner Mould, MD   Encounter Date: 03/30/2018  OT End of Session - 03/30/18 1639    Visit Number  1    Number of Visits  36    Date for OT Re-Evaluation  06/28/18    OT Start Time  0205    OT Stop Time  0335    OT Time Calculation (min)  90 min       Past Medical History:  Diagnosis Date  . Arthritis   . Hypertension     Past Surgical History:  Procedure Laterality Date  . ABDOMINAL HYSTERECTOMY    . REPLACEMENT TOTAL KNEE BILATERAL    . SPINE SURGERY      There were no vitals filed for this visit.  Subjective Assessment - 03/31/18 1632    Subjective   Evelyn Cisneros is referred to Occupational Therapy for Complete Decongestive Therapy to address BLE lymphedema by her PCP, Verner Mould, MD.    Patient is accompained by:  Family member    Patient Stated Goals  Get the swelling down so I can move better  and so my legs dont hurt as much.    Pain Onset  More than a month ago          LYMPHEDEMA/ONCOLOGY QUESTIONNAIRE - 03/30/18 1704      Date Lymphedema/Swelling Started   Date  02/22/18      What other symptoms do you have   Are you Having Heaviness or Tightness  Yes    Are you having Pain  Yes    Are you having pitting edema  Yes    Body Site  RLE    Is it Hard or Difficult finding clothes that fit  Yes    Do you have infections  Yes    Stemmer Sign  Yes      Lymphedema Stage   Stage  STAGE 2 SPONTANEOUSLY IRREVERSIBLE      Lymphedema Assessments   Lymphedema Assessments  Lower extremities      Right Lower Extremity Lymphedema   Other  TBA 1st Rx visit. Estimate 20% below the knee to toes      Left Lower Extremity Lymphedema   Other  TBA 1st Rx  visit. Estimate 10% below the knee         Moderate, Stage II , RLE lymphedema (LE) 2/2  suspected CVI ; Mild, LLE LE 2/2 CVI Skin Description Hyper-Keratosis Peau' de Orange Shiny Tight Fibrotic Fatty Doughy Indurated    x x X Moderately dense  dermal fibrosis below knees, L>R      Hydration Dry Flaky Erythema Other   x x     Color Redness Present Pallor Blanching Hemosiderin Staining Other   x  x x     Odor Malodorous Yeast  Absent      x   Temperature Warm Cool Normal   x x    Pitting Edema   1+ 2+ 3+ 4+ Non-pitting       x    Girth Symmetrical Asymmetrical Other Distribution    R>L Feet to groin bilaterally, R>L   Stemmer Sign Positive Negative     Strong +    Lymphorrea History Of:  Present Absent   endorses  x    Wounds History Of Present Absent Venous Arterial Pressure Size     x          Signs of Infection Redness Warmth Erythema Acute Swelling Drainage Borders                   Scars Adhesions Hypersensitivity        Sensation Light Touch Deep pressure Hypersensitivty   Present Impaired Present Impaired Present Impaired    x    x   x  Nails WNL Fungus Present Other   x     Hair Growth Symmetrical Asymmetrical   unremarkable    Skin Creases Base of toes Ankle Base of Finger Medial Thigh         + Stemmer sign x                    OT Education - 03/30/18 1639    Education Details  Provided Pt/caregiver skilled education regarding lymphatic structure and function, various lymphedema etiologies, obnset patterns and progression,  impact of obesity on lymphatic system function, and  discussed Complete Decongestive Therapy for LE care, including 4 components of Intensive and Self Management Phases.  Discussed   Importance of daily daily LE self care to retain clinical gains and limit progression.  Discussed lymphedema precautions, cellulitis risk,  Provided printed Lymphedema Workbook for reference.    Person(s) Educated   Patient;Spouse    Methods  Explanation;Demonstration;Handout;Verbal cues;Tactile cues    Comprehension  Verbalized understanding;Returned demonstration;Verbal cues required;Tactile cues required;Need further instruction          OT Long Term Goals - 03/30/18 1618      OT LONG TERM GOAL #1   Title  Pt independent w/ lymphedema precautions/prevention principals and using printed reference to limit LE progression and infection risk.    Baseline  Max I    Time  2    Period  Weeks    Status  New    Target Date  -- 10th OT Rx visit      OT LONG TERM GOAL #2   Title  Lymphedema (LE) management/ self-care: Pt able to apply knee/ thigh length gradient compression wraps to using correct techniques with max assist from spouse  within 2 weeks to achieve optimal limb volume reduction during Intensive Phase of CDT and optimal self-management over time.    Baseline  dependent    Time  2    Period  Weeks    Status  New    Target Date  -- 10th OT Rx visit      OT LONG TERM GOAL #3   Title  Lymphedema (LE) management/ self-care:  Pt to achieve at least 15%  limb volume reduction in  RLE, and 10 % limb volume reduction in LLE during Intensive Phase CDT to limit LE progression, to decrease infection risk and to improve functional performance in all occupational domains.     Baseline  Max A    Time  12    Period  Weeks    Status  New    Target Date  06/27/18      OT LONG TERM GOAL #4   Title  Lymphedema (LE) management/ self-care:  Pt to tolerate daily compression garments and/ or HOS devices in keeping w/ prescribed wear regime within 1 week of issue date to restore normal limb shape to reduce tissue density and protein build up leading to progression.    Baseline  max A    Time  12    Period  Weeks    Status  New    Target Date  06/27/18      OT LONG TERM GOAL #5   Title  Lymphedema (LE) Pain: Pt to report 25% reduction in discomfort/ pain described as LE  "heaviness", "fullness",  tightness"" by DC to facilitate improved functional mobility,  ambulation, and transfers essential for safety and performance of basic and instrumental ADLs.     Baseline  Max A    Time  12    Period  Weeks    Status  New    Target Date  06/29/18      Long Term Additional Goals   Additional Long Term Goals  Yes      OT LONG TERM GOAL #6   Title  Lymphedema (LE) management/ self-care:  During Management Phase CDT Pt to sustain reduced limb volumes achieved during Intensive Phase CDT at follow up visits during Management Phase CDT within 3% using LE self-care home program components as directed ( simple self MLD, skin care, ther ex and daily/ nightly compression garments/ devices) .    Baseline  Max A    Time  6    Period  Months    Status  New    Target Date  09/27/18            Plan - 03/30/18 1626    Clinical Impression Statement  Sheral ApleyLois Manfredonia presents with moderate, stage II, LLE lymphedema 2/2 suspected CVI with recent  non-resolving exacerbation 2/2 cellulitis episode. LLE presents with mild, stage II LE . Chronic leg swelling and associated pain limits Pt's ability to perform basic and instrumental ADLs, limits ambulation and functional mobility, contributes to increased falls risk due to significant body asymmetry,  limits independence with productive activities and leisure pursuits, and negatively impacts role performance, social participation and body image. Pt will benefit from skilled Occupational Therapy for Intensive and Management Phase Complete Decongestive Therapy (CDT) o decrease limb volumes, reduce risk of recurrent infection, improve safe ambulation and transfers, and increase functional independence with activities of daily living. Without OT for CDT, the condition will progress and ongoing functional decline is expected.    Occupational performance deficits (Please refer to evaluation for details):  ADL's;IADL's;Rest and Sleep;Work;Leisure;Social Participation;Other  body imageimpaired by leg swelling, increased falols risk 2/2 body asymmetry    Rehab Potential  Good    OT Frequency  3x / week    OT Duration  12 weeks    OT Treatment/Interventions  Self-care/ADL training;Therapeutic exercise;Manual lymph drainage;Compression bandaging;Patient/family education;Other (comment);Therapeutic activities;DME and/or AE instruction;Manual Therapy BLE skin care to improve skin hydration and decrease infection risk    Clinical Decision Making  Several treatment options, min-mod task modification necessary    OT Home Exercise Plan  lymphatic pumping ther ex : 1 set of 10 , in order, 2 x daily, BLE,     Recommended Other Services  fit with comfortable compression garments that are effective for controlling leg swelling , comfortable and easy to don and doff using assistive devices    Consulted and Agree with Plan of Care  Patient;Family member/caregiver       Patient will benefit from skilled therapeutic intervention in order to improve the following deficits and impairments:  Abnormal gait, Decreased knowledge of use of DME, Decreased skin integrity, Increased edema, Impaired flexibility, Pain, Decreased mobility, Impaired sensation, Decreased activity tolerance, Decreased range of motion, Decreased strength, Increased  muscle spasms, Decreased balance, Decreased knowledge of precautions, Difficulty walking, Impaired UE functional use  Visit Diagnosis: Lymphedema, not elsewhere classified - Plan: Ot plan of care cert/re-cert    Problem List Patient Active Problem List   Diagnosis Date Noted  . Abnormal glucose level 02/12/2018  . Abnormal weight gain 02/12/2018  . Allergic rhinitis 02/12/2018  . Asthma 02/12/2018  . Bradycardia 02/12/2018  . Edema 02/12/2018  . Heart murmur 02/12/2018  . Hypercalcemia 02/12/2018  . Hypokalemia 02/12/2018  . Pure hypercholesterolemia 02/12/2018  . Shoulder joint pain 02/12/2018  . Skin sensation disturbance 02/12/2018  .  Syncope 02/12/2018  . Vitamin D deficiency 02/12/2018  . 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 Muscle spasticity 04/24/2017  . Weight loss 12/19/2016  . Hypothyroidism 11/24/2016  . Bilateral leg edema 11/24/2016  . Cervical radiculopathy 11/24/2016  . Disc degeneration, lumbar 11/24/2016  . Essential hypertension 11/24/2016  . Gallstones 11/24/2016  . History of anxiety state 11/24/2016  . Hyperlipidemia, unspecified 11/24/2016  . Spinal stenosis of lumbar region with neurogenic claudication 11/08/2016  . Anxiety 06/08/2015    Judithann Sauger 03/31/2018, 5:05 PM  [facility] MAIN [facility] SERVICES [address] [place], [place], [address] Phone: [phone]   Fax:  [phone]  Name: Huldah Marin MRN: [id number] Date of Birth: [birth year]

## 2018-04-01 ENCOUNTER — Ambulatory Visit: Payer: Medicare Other | Admitting: Occupational Therapy

## 2018-04-01 DIAGNOSIS — I890 Lymphedema, not elsewhere classified: Secondary | ICD-10-CM | POA: Diagnosis not present

## 2018-04-02 ENCOUNTER — Ambulatory Visit: Payer: Medicare Other | Admitting: Occupational Therapy

## 2018-04-02 DIAGNOSIS — I890 Lymphedema, not elsewhere classified: Secondary | ICD-10-CM

## 2018-04-02 NOTE — Therapy (Signed)
[facility] [address] [place], [place], [address] Phone: [phone]   Fax:  [phone]  Occupational Therapy Treatment  Patient Details  Name: Evelyn ApleyLois Spallone MRN: [id number] Date of Birth: 08/04/1941 Referring Provider: Verner MouldVickie A Fowler, MD   Encounter Date: 04/02/2018  OT End of Session - 04/02/18 1457    Visit Number  4    Number of Visits  36    Date for OT Re-Evaluation  06/28/18    OT Start Time  0100    OT Stop Time  0210    OT Time Calculation (min)  70 min    Activity Tolerance  Patient tolerated treatment well;No increased pain;Patient limited by pain    Behavior During Therapy  [facility] for tasks assessed/performed       Past Medical History:  Diagnosis Date  . Arthritis   . Hypertension     Past Surgical History:  Procedure Laterality Date  . ABDOMINAL HYSTERECTOMY    . REPLACEMENT TOTAL KNEE BILATERAL    . SPINE SURGERY      There were no vitals filed for this visit.  Subjective Assessment - 04/02/18 1311    Subjective   Pt arrived   for OT visit #4/36 to address BLE LE, R>L. Pt presents with knee length RLE compression wraps in place. She is accompanied by her spouse. Pt has no new compllaints.  (Pended)     Patient is accompained by:  Family member  (Pended)     Patient Stated Goals  Get the swelling down so I can move better  and so my legs dont hurt as much.  (Pended)     Currently in Pain?  Yes  (Pended)  chronic pain persists.    Pain Score  1   (Pended)     Pain Location  Leg  (Pended)     Pain Orientation  Right;Left  (Pended)     Pain Descriptors / Indicators  Tightness;Tingling;Tiring;Heaviness;Tender;Discomfort;Aching;Numbness;Grimacing;Guarding;Sore  (Pended)     Pain Type  Chronic pain  (Pended)     Pain Onset  More than a month ago  (Pended)           LYMPHEDEMA/ONCOLOGY QUESTIONNAIRE - 04/02/18 0801      Right Lower Extremity Lymphedema   Other  RLE A-D (ankle to  tibial  tuberosity) measures 3851.85 ml. RLE E-G (thigh) volume measures 5623.90 ml. RLE full leg (A-G) measures 9475.75 ml.    Other  Limb Volume differential (LVD) at each segmnment measures : A-D= 13.8%, R>L; E-G LVD = 16.35%, R>L; RLE LVG for A-G = 15.31%.      Left Lower Extremity Lymphedema   Other  LLE A-D (ankle to  tibial tuberosity) measures 3320.27 ml. LLE E-G (thigh) volume measures 4704.29 ml. LLE full leg (A-G) measures 8024.56 ml.              OT Treatments/Exercises (OP) - 04/02/18 1453      Manual Therapy   Manual Therapy  Edema management;Compression Bandaging;Manual Lymphatic Drainage (MLD)    Manual Lymphatic Drainage (MLD)  Performed MLD for LE sequence utilizing short neck, core lymphatics   and functional inguinal LN.     Compression Bandaging  3 layer RLE compression wraps as estavblished             OT Education - 04/02/18 1455    Education Details  Pt and spouse edu for simple self MLD- intro level    Person(s) Educated  Patient;Spouse  Methods  Explanation;Demonstration;Tactile cues;Verbal cues;Handout    Comprehension  Verbalized understanding;Returned demonstration;Verbal cues required;Tactile cues required;Need further instruction          OT Long Term Goals - 03/30/18 1618      OT LONG TERM GOAL #1   Title  Pt independent w/ lymphedema precautions/prevention principals and using printed reference to limit LE progression and infection risk.    Baseline  Max I    Time  2    Period  Weeks    Status  New    Target Date  -- 10th OT Rx visit      OT LONG TERM GOAL #2   Title  Lymphedema (LE) management/ self-care: Pt able to apply knee/ thigh length gradient compression wraps to using correct techniques with max assist from spouse  within 2 weeks to achieve optimal limb volume reduction during Intensive Phase of CDT and optimal self-management over time.    Baseline  dependent    Time  2    Period  Weeks    Status  New    Target Date  --  10th OT Rx visit      OT LONG TERM GOAL #3   Title  Lymphedema (LE) management/ self-care:  Pt to achieve at least 15%  limb volume reduction in  RLE, and 10 % limb volume reduction in LLE during Intensive Phase CDT to limit LE progression, to decrease infection risk and to improve functional performance in all occupational domains.     Baseline  Max A    Time  12    Period  Weeks    Status  New    Target Date  06/27/18      OT LONG TERM GOAL #4   Title  Lymphedema (LE) management/ self-care:  Pt to tolerate daily compression garments and/ or HOS devices in keeping w/ prescribed wear regime within 1 week of issue date to restore normal limb shape to reduce tissue density and protein build up leading to progression.    Baseline  max A    Time  12    Period  Weeks    Status  New    Target Date  06/27/18      OT LONG TERM GOAL #5   Title  Lymphedema (LE) Pain: Pt to report 25% reduction in discomfort/ pain described as LE  "heaviness", "fullness", tightness"" by DC to facilitate improved functional mobility,  ambulation, and transfers essential for safety and performance of basic and instrumental ADLs.     Baseline  Max A    Time  12    Period  Weeks    Status  New    Target Date  06/29/18      Long Term Additional Goals   Additional Long Term Goals  Yes      OT LONG TERM GOAL #6   Title  Lymphedema (LE) management/ self-care:  During Management Phase CDT Pt to sustain reduced limb volumes achieved during Intensive Phase CDT at follow up visits during Management Phase CDT within 3% using LE self-care home program components as directed ( simple self MLD, skin care, ther ex and daily/ nightly compression garments/ devices) .    Baseline  Max A    Time  6    Period  Months    Status  New    Target Date  09/27/18            Plan - 04/02/18 1456    Clinical Impression  Statement  Intro level pT and spouse edu for simple self MLD. Pt able to perform J stroke W lL hand but not with  R due to neurological impairment in hand function. Pt tolerated MLD without difficulty. Provided skin care throughout MLD and applied compression wraps as established. Cont as per POC.    Occupational performance deficits (Please refer to evaluation for details):  ADL's;IADL's;Rest and Sleep;Work;Leisure;Social Participation;Other body imageimpaired by leg swelling, increased falols risk 2/2 body asymmetry    Rehab Potential  Good    OT Frequency  3x / week    OT Duration  12 weeks    OT Treatment/Interventions  Self-care/ADL training;Therapeutic exercise;Manual lymph drainage;Compression bandaging;Patient/family education;Other (comment);Therapeutic activities;DME and/or AE instruction;Manual Therapy BLE skin care to improve skin hydration and decrease infection risk    Clinical Decision Making  Several treatment options, min-mod task modification necessary    OT Home Exercise Plan  lymphatic pumping ther ex : 1 set of 10 , in order, 2 x daily, BLE,     Recommended Other Services  fit with comfortable compression garments that are effective for controlling leg swelling , comfortable and easy to don and doff using assistive devices    Consulted and Agree with Plan of Care  Patient;Family member/caregiver       Patient will benefit from skilled therapeutic intervention in order to improve the following deficits and impairments:  Abnormal gait, Decreased knowledge of use of DME, Decreased skin integrity, Increased edema, Impaired flexibility, Pain, Decreased mobility, Impaired sensation, Decreased activity tolerance, Decreased range of motion, Decreased strength, Increased muscle spasms, Decreased balance, Decreased knowledge of precautions, Difficulty walking, Impaired UE functional use  Visit Diagnosis: Lymphedema, not elsewhere classified    Problem List Patient Active Problem List   Diagnosis Date Noted  . Abnormal glucose level 02/12/2018  . Abnormal weight gain 02/12/2018  . Allergic  rhinitis 02/12/2018  . Asthma 02/12/2018  . Bradycardia 02/12/2018  . Edema 02/12/2018  . Heart murmur 02/12/2018  . Hypercalcemia 02/12/2018  . Hypokalemia 02/12/2018  . Pure hypercholesterolemia 02/12/2018  . Shoulder joint pain 02/12/2018  . Skin sensation disturbance 02/12/2018  . Syncope 02/12/2018  . Vitamin D deficiency 02/12/2018  . 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 Muscle spasticity 04/24/2017  . Weight loss 12/19/2016  . Hypothyroidism 11/24/2016  . Bilateral leg edema 11/24/2016  . Cervical radiculopathy 11/24/2016  . Disc degeneration, lumbar 11/24/2016  . Essential hypertension 11/24/2016  . Gallstones 11/24/2016  . History of anxiety state 11/24/2016  . Hyperlipidemia, unspecified 11/24/2016  . Spinal stenosis of lumbar region with neurogenic claudication 11/08/2016  . Anxiety 06/08/2015    Loel Dubonnet, MS, OTR/L, [facility] 04/02/18 2:59 PM  [facility] MAIN [facility] SERVICES [address] [place], [place], [address] Phone: [phone]   Fax:  [phone]  Name: Heily Carlucci MRN: [id number] Date of Birth: Mar 13, 1941

## 2018-04-02 NOTE — Therapy (Signed)
[facility] [address] [place], [place], [address] Phone: [phone]   Fax:  [phone]  Occupational Therapy Treatment  Patient Details  Name: Evelyn ApleyLois Cisneros MRN: [id number] Date of Birth: 05/03/1941 Referring Provider: Verner MouldVickie A Fowler, MD   Encounter Date: 03/31/2018    Past Medical History:  Diagnosis Date  . Arthritis   . Hypertension     Past Surgical History:  Procedure Laterality Date  . ABDOMINAL HYSTERECTOMY    . REPLACEMENT TOTAL KNEE BILATERAL    . SPINE SURGERY      There were no vitals filed for this visit.  Subjective Assessment - 04/01/18 1007    Subjective   Evelyn Cisneros is referred to Occupational Therapy for Complete Decongestive Therapy to address BLE lymphedema by her PCP, Verner MouldVickie A Fowler, MD.    Patient is accompained by:  Family member    Patient Stated Goals  Get the swelling down so I can move better  and so my legs dont hurt as much.    Pain Onset  More than a month ago          LYMPHEDEMA/ONCOLOGY QUESTIONNAIRE - 04/02/18 0801      Right Lower Extremity Lymphedema   Other  RLE A-D (ankle to  tibial tuberosity) measures 3851.85 ml. RLE E-G (thigh) volume measures 5623.90 ml. RLE full leg (A-G) measures 9475.75 ml.    Other  Limb Volume differential (LVD) at each segmnment measures : A-D= 13.8%, R>L; E-G LVD = 16.35%, R>L; RLE LVG for A-G = 15.31%.      Left Lower Extremity Lymphedema   Other  LLE A-D (ankle to  tibial tuberosity) measures 3320.27 ml. LLE E-G (thigh) volume measures 4704.29 ml. LLE full leg (A-G) measures 8024.56 ml.              OT Treatments/Exercises (OP) - 04/02/18 0001      ADLs   ADL Education Given  Yes      Manual Therapy   Manual Therapy  Edema management;Compression Bandaging                  OT Long Term Goals - 03/30/18 1618      OT LONG TERM GOAL #1   Title  Pt independent w/ lymphedema precautions/prevention principals  and using printed reference to limit LE progression and infection risk.    Baseline  Max I    Time  2    Period  Weeks    Status  New    Target Date  -- 10th OT Rx visit      OT LONG TERM GOAL #2   Title  Lymphedema (LE) management/ self-care: Pt able to apply knee/ thigh length gradient compression wraps to using correct techniques with max assist from spouse  within 2 weeks to achieve optimal limb volume reduction during Intensive Phase of CDT and optimal self-management over time.    Baseline  dependent    Time  2    Period  Weeks    Status  New    Target Date  -- 10th OT Rx visit      OT LONG TERM GOAL #3   Title  Lymphedema (LE) management/ self-care:  Pt to achieve at least 15%  limb volume reduction in  RLE, and 10 % limb volume reduction in LLE during Intensive Phase CDT to limit LE progression, to decrease infection risk and to improve functional performance in all occupational domains.  Baseline  Max A    Time  12    Period  Weeks    Status  New    Target Date  06/27/18      OT LONG TERM GOAL #4   Title  Lymphedema (LE) management/ self-care:  Pt to tolerate daily compression garments and/ or HOS devices in keeping w/ prescribed wear regime within 1 week of issue date to restore normal limb shape to reduce tissue density and protein build up leading to progression.    Baseline  max A    Time  12    Period  Weeks    Status  New    Target Date  06/27/18      OT LONG TERM GOAL #5   Title  Lymphedema (LE) Pain: Pt to report 25% reduction in discomfort/ pain described as LE  "heaviness", "fullness", tightness"" by DC to facilitate improved functional mobility,  ambulation, and transfers essential for safety and performance of basic and instrumental ADLs.     Baseline  Max A    Time  12    Period  Weeks    Status  New    Target Date  06/29/18      Long Term Additional Goals   Additional Long Term Goals  Yes      OT LONG TERM GOAL #6   Title  Lymphedema (LE)  management/ self-care:  During Management Phase CDT Pt to sustain reduced limb volumes achieved during Intensive Phase CDT at follow up visits during Management Phase CDT within 3% using LE self-care home program components as directed ( simple self MLD, skin care, ther ex and daily/ nightly compression garments/ devices) .    Baseline  Max A    Time  6    Period  Months    Status  New    Target Date  09/27/18              Patient will benefit from skilled therapeutic intervention in order to improve the following deficits and impairments:  Abnormal gait, Decreased knowledge of use of DME, Decreased skin integrity, Increased edema, Impaired flexibility, Pain, Decreased mobility, Impaired sensation, Decreased activity tolerance, Decreased range of motion, Decreased strength, Increased muscle spasms, Decreased balance, Decreased knowledge of precautions, Difficulty walking, Impaired UE functional use  Visit Diagnosis: Lymphedema, not elsewhere classified    Problem List Patient Active Problem List   Diagnosis Date Noted  . Abnormal glucose level 02/12/2018  . Abnormal weight gain 02/12/2018  . Allergic rhinitis 02/12/2018  . Asthma 02/12/2018  . Bradycardia 02/12/2018  . Edema 02/12/2018  . Heart murmur 02/12/2018  . Hypercalcemia 02/12/2018  . Hypokalemia 02/12/2018  . Pure hypercholesterolemia 02/12/2018  . Shoulder joint pain 02/12/2018  . Skin sensation disturbance 02/12/2018  . Syncope 02/12/2018  . Vitamin D deficiency 02/12/2018  . 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 Muscle spasticity 04/24/2017  . Weight loss 12/19/2016  . Hypothyroidism 11/24/2016  . Bilateral leg edema 11/24/2016  . Cervical radiculopathy 11/24/2016  . Disc degeneration, lumbar 11/24/2016  . Essential hypertension 11/24/2016  . Gallstones 11/24/2016  . History of anxiety state 11/24/2016  . Hyperlipidemia, unspecified 11/24/2016  . Spinal  stenosis of lumbar region with neurogenic claudication 11/08/2016  . Anxiety 06/08/2015    Loel Dubonnet, MS, OTR/L, [facility] 04/02/18 8:14 AM   [facility] MAIN [facility] SERVICES [address] [place], [place], [address] Phone: [phone]   Fax:  [phone]  Name: Evelyn Cisneros MRN: [id number] Date of Birth: 1941/08/20

## 2018-04-02 NOTE — Therapy (Signed)
[facility] [address] , [place], [address] Phone: [phone]   Fax:  [phone]  Occupational Therapy Treatment  Patient Details  Name: Evelyn ApleyLois Faraone MRN: [id number] Date of Birth: 06/07/1941 Referring Provider: Verner MouldVickie A Fowler, MD   Encounter Date: 04/01/2018  OT End of Session - 04/01/18 0819    Visit Number  3    Number of Visits  36    Date for OT Re-Evaluation  06/28/18    OT Start Time  1000    OT Stop Time  1100    OT Time Calculation (min)  60 min    Activity Tolerance  Patient tolerated treatment well;No increased pain;Patient limited by pain    Behavior During Therapy  [facility] for tasks assessed/performed       Past Medical History:  Diagnosis Date  . Arthritis   . Hypertension     Past Surgical History:  Procedure Laterality Date  . ABDOMINAL HYSTERECTOMY    . REPLACEMENT TOTAL KNEE BILATERAL    . SPINE SURGERY      There were no vitals filed for this visit.  Subjective Assessment - 04/01/18 1007    Subjective   Pt arrived   for OT visit #3/36 to address BLE LE, R>L. Pt presents with knee length RLE compression wraps in place. She is accompanied by her spouse. "We didnt take the wraps off."    Patient is accompained by:  Family member    Patient Stated Goals  Get the swelling down so I can move better  and so my legs dont hurt as much.    Currently in Pain?  Yes chronic pain unchanged. Leg pain persists at 3/10 today    Pain Onset  More than a month ago          LYMPHEDEMA/ONCOLOGY QUESTIONNAIRE - 04/02/18 0801      Right Lower Extremity Lymphedema   Other  RLE A-D (ankle to  tibial tuberosity) measures 3851.85 ml. RLE E-G (thigh) volume measures 5623.90 ml. RLE full leg (A-G) measures 9475.75 ml.    Other  Limb Volume differential (LVD) at each segmnment measures : A-D= 13.8%, R>L; E-G LVD = 16.35%, R>L; RLE LVG for A-G = 15.31%.      Left Lower Extremity Lymphedema   Other  LLE  A-D (ankle to  tibial tuberosity) measures 3320.27 ml. LLE E-G (thigh) volume measures 4704.29 ml. LLE full leg (A-G) measures 8024.56 ml.              OT Treatments/Exercises (OP) - 04/02/18 0001      ADLs   ADL Education Given  Yes      Manual Therapy   Manual Therapy  Edema management;Compression Bandaging             OT Education - 04/02/18 0818    Education Details  Continued Pt and CG edu for LE self care regimes. Provided skilled instruction for gradient compression wrapping from foot to below knee. By end of session spouse able to apply wraps using correct techniques with max A.    Person(s) Educated  Patient;Spouse    Methods  Explanation;Demonstration;Tactile cues;Verbal cues;Handout    Comprehension  Verbalized understanding;Returned demonstration;Verbal cues required;Tactile cues required;Need further instruction          OT Long Term Goals - 03/30/18 1618      OT LONG TERM GOAL #1   Title  Pt independent w/ lymphedema precautions/prevention principals and using printed reference to  limit LE progression and infection risk.    Baseline  Max I    Time  2    Period  Weeks    Status  New    Target Date  -- 10th OT Rx visit      OT LONG TERM GOAL #2   Title  Lymphedema (LE) management/ self-care: Pt able to apply knee/ thigh length gradient compression wraps to using correct techniques with max assist from spouse  within 2 weeks to achieve optimal limb volume reduction during Intensive Phase of CDT and optimal self-management over time.    Baseline  dependent    Time  2    Period  Weeks    Status  New    Target Date  -- 10th OT Rx visit      OT LONG TERM GOAL #3   Title  Lymphedema (LE) management/ self-care:  Pt to achieve at least 15%  limb volume reduction in  RLE, and 10 % limb volume reduction in LLE during Intensive Phase CDT to limit LE progression, to decrease infection risk and to improve functional performance in all occupational domains.      Baseline  Max A    Time  12    Period  Weeks    Status  New    Target Date  06/27/18      OT LONG TERM GOAL #4   Title  Lymphedema (LE) management/ self-care:  Pt to tolerate daily compression garments and/ or HOS devices in keeping w/ prescribed wear regime within 1 week of issue date to restore normal limb shape to reduce tissue density and protein build up leading to progression.    Baseline  max A    Time  12    Period  Weeks    Status  New    Target Date  06/27/18      OT LONG TERM GOAL #5   Title  Lymphedema (LE) Pain: Pt to report 25% reduction in discomfort/ pain described as LE  "heaviness", "fullness", tightness"" by DC to facilitate improved functional mobility,  ambulation, and transfers essential for safety and performance of basic and instrumental ADLs.     Baseline  Max A    Time  12    Period  Weeks    Status  New    Target Date  06/29/18      Long Term Additional Goals   Additional Long Term Goals  Yes      OT LONG TERM GOAL #6   Title  Lymphedema (LE) management/ self-care:  During Management Phase CDT Pt to sustain reduced limb volumes achieved during Intensive Phase CDT at follow up visits during Management Phase CDT within 3% using LE self-care home program components as directed ( simple self MLD, skin care, ther ex and daily/ nightly compression garments/ devices) .    Baseline  Max A    Time  6    Period  Months    Status  New    Target Date  09/27/18            Plan - 04/01/18 0820    Clinical Impression Statement  Continued Pt and CG edu for LE self care regimes. Provided skilled instruction for gradient compression wrapping from foot to below knee. By end of session spouse able to apply wraps using correct techniques with max A. Score on Lymphedema Life Impact Scale (LLIS)  measures 25.53% perceived disability  associated with LE in past week. Cont as  per POC.    Occupational performance deficits (Please refer to evaluation for details):   ADL's;IADL's;Rest and Sleep;Work;Leisure;Social Participation;Other body imageimpaired by leg swelling, increased falols risk 2/2 body asymmetry    Rehab Potential  Good    OT Frequency  3x / week    OT Duration  12 weeks    OT Treatment/Interventions  Self-care/ADL training;Therapeutic exercise;Manual lymph drainage;Compression bandaging;Patient/family education;Other (comment);Therapeutic activities;DME and/or AE instruction;Manual Therapy BLE skin care to improve skin hydration and decrease infection risk    Clinical Decision Making  Several treatment options, min-mod task modification necessary    OT Home Exercise Plan  lymphatic pumping ther ex : 1 set of 10 , in order, 2 x daily, BLE,     Recommended Other Services  fit with comfortable compression garments that are effective for controlling leg swelling , comfortable and easy to don and doff using assistive devices    Consulted and Agree with Plan of Care  Patient;Family member/caregiver       Patient will benefit from skilled therapeutic intervention in order to improve the following deficits and impairments:  Abnormal gait, Decreased knowledge of use of DME, Decreased skin integrity, Increased edema, Impaired flexibility, Pain, Decreased mobility, Impaired sensation, Decreased activity tolerance, Decreased range of motion, Decreased strength, Increased muscle spasms, Decreased balance, Decreased knowledge of precautions, Difficulty walking, Impaired UE functional use  Visit Diagnosis: Lymphedema, not elsewhere classified    Problem List Patient Active Problem List   Diagnosis Date Noted  . Abnormal glucose level 02/12/2018  . Abnormal weight gain 02/12/2018  . Allergic rhinitis 02/12/2018  . Asthma 02/12/2018  . Bradycardia 02/12/2018  . Edema 02/12/2018  . Heart murmur 02/12/2018  . Hypercalcemia 02/12/2018  . Hypokalemia 02/12/2018  . Pure hypercholesterolemia 02/12/2018  . Shoulder joint pain 02/12/2018  . Skin sensation  disturbance 02/12/2018  . Syncope 02/12/2018  . Vitamin D deficiency 02/12/2018  . 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 Muscle spasticity 04/24/2017  . Weight loss 12/19/2016  . Hypothyroidism 11/24/2016  . Bilateral leg edema 11/24/2016  . Cervical radiculopathy 11/24/2016  . Disc degeneration, lumbar 11/24/2016  . Essential hypertension 11/24/2016  . Gallstones 11/24/2016  . History of anxiety state 11/24/2016  . Hyperlipidemia, unspecified 11/24/2016  . Spinal stenosis of lumbar region with neurogenic claudication 11/08/2016  . Anxiety 06/08/2015    Loel Dubonnet, MS, OTR/L, [facility] 04/02/18 8:26 AM  [facility] MAIN [facility] SERVICES [address] [place], [place], [address] Phone: [phone]   Fax:  [phone]  Name: Melony Tenpas MRN: [id number] Date of Birth: 04/03/1941

## 2018-04-06 ENCOUNTER — Ambulatory Visit: Payer: Medicare Other | Admitting: Occupational Therapy

## 2018-04-06 DIAGNOSIS — I890 Lymphedema, not elsewhere classified: Secondary | ICD-10-CM | POA: Diagnosis not present

## 2018-04-06 NOTE — Therapy (Signed)
[facility] MAIN [facility] SERVICES [address] [place], [place], [address] Phone: [phone]   Fax:  [phone]  Occupational Therapy Treatment  Patient Details  Name: Evelyn Cisneros MRN: [id number] Date of Birth: 1941-10-21 Referring Provider: Verner Mould, MD   Encounter Date: 04/06/2018  OT End of Session - 04/06/18 1751    Visit Number  5    Number of Visits  36    Date for OT Re-Evaluation  06/28/18    OT Start Time  0102    OT Stop Time  0208    OT Time Calculation (min)  66 min    Activity Tolerance  Patient tolerated treatment well;No increased pain;Patient limited by pain    Behavior During Therapy  [facility] for tasks assessed/performed       Past Medical History:  Diagnosis Date  . Arthritis   . Hypertension     Past Surgical History:  Procedure Laterality Date  . ABDOMINAL HYSTERECTOMY    . REPLACEMENT TOTAL KNEE BILATERAL    . SPINE SURGERY      There were no vitals filed for this visit.  Subjective Assessment - 04/06/18 1744    Subjective   Pt arrived   for OT visit #5/36 to address BLE LE, R>L. Pt presents with knee length RLE compression wraps in place. She is accompanied by her spouse. Pt reports wrapping over the weekend was not too difficult. Pt asks of she can remove wraps next weekend to attend a social event.    Patient is accompained by:  Family member    Patient Stated Goals  Get the swelling down so I can move better  and so my legs dont hurt as much.    Currently in Pain?  Yes chronic pain unchanged. Not rated today.    Pain Onset  More than a month ago                   OT Treatments/Exercises (OP) - 04/06/18 0001      ADLs   ADL Education Given  Yes      Manual Therapy   Manual Therapy  Edema management;Compression Bandaging;Manual Lymphatic Drainage (MLD)    Edema Management  skin care to RLE using castor oil during MLD and euceri lotion prior to applying wraps.    Manual Lymphatic  Drainage (MLD)  Performed MLD for LE sequence utilizing short neck, core lymphatics   and functional inguinal LN.     Compression Bandaging  2 layer RLE compression wraps today vs 3 layer as leg is well decongested below the knee.             OT Education - 04/06/18 1748    Education Details  Cont Pt and spouse edu for long term effects of LE and progression over time without self management. Reviewed importance of consistent compression for optimal clinical outcome and for optimal onging self management.  Encouraged Pt to continue to apply wraps, despite upcomming social event.  Pt understand OT will leave that decision up to patient  . She understands she will see increase in swelling  . Cont as per POC.    Person(s) Educated  Patient;Spouse    Methods  Explanation;Demonstration;Tactile cues;Verbal cues;Handout    Comprehension  Verbalized understanding;Returned demonstration;Verbal cues required;Tactile cues required;Need further instruction          OT Long Term Goals - 03/30/18 1618      OT LONG TERM GOAL #1   Title  Pt independent w/ lymphedema precautions/prevention principals and using printed reference to limit LE progression and infection risk.    Baseline  Max I    Time  2    Period  Weeks    Status  New    Target Date  -- 10th OT Rx visit      OT LONG TERM GOAL #2   Title  Lymphedema (LE) management/ self-care: Pt able to apply knee/ thigh length gradient compression wraps to using correct techniques with max assist from spouse  within 2 weeks to achieve optimal limb volume reduction during Intensive Phase of CDT and optimal self-management over time.    Baseline  dependent    Time  2    Period  Weeks    Status  New    Target Date  -- 10th OT Rx visit      OT LONG TERM GOAL #3   Title  Lymphedema (LE) management/ self-care:  Pt to achieve at least 15%  limb volume reduction in  RLE, and 10 % limb volume reduction in LLE during Intensive Phase CDT to limit LE  progression, to decrease infection risk and to improve functional performance in all occupational domains.     Baseline  Max A    Time  12    Period  Weeks    Status  New    Target Date  06/27/18      OT LONG TERM GOAL #4   Title  Lymphedema (LE) management/ self-care:  Pt to tolerate daily compression garments and/ or HOS devices in keeping w/ prescribed wear regime within 1 week of issue date to restore normal limb shape to reduce tissue density and protein build up leading to progression.    Baseline  max A    Time  12    Period  Weeks    Status  New    Target Date  06/27/18      OT LONG TERM GOAL #5   Title  Lymphedema (LE) Pain: Pt to report 25% reduction in discomfort/ pain described as LE  "heaviness", "fullness", tightness"" by DC to facilitate improved functional mobility,  ambulation, and transfers essential for safety and performance of basic and instrumental ADLs.     Baseline  Max A    Time  12    Period  Weeks    Status  New    Target Date  06/29/18      Long Term Additional Goals   Additional Long Term Goals  Yes      OT LONG TERM GOAL #6   Title  Lymphedema (LE) management/ self-care:  During Management Phase CDT Pt to sustain reduced limb volumes achieved during Intensive Phase CDT at follow up visits during Management Phase CDT within 3% using LE self-care home program components as directed ( simple self MLD, skin care, ther ex and daily/ nightly compression garments/ devices) .    Baseline  Max A    Time  6    Period  Months    Status  New    Target Date  09/27/18            Plan - 04/06/18 1752    Clinical Impression Statement  RLE well managed over the weekend. Souse is able to apply wraps very well using skiled techniques. Pt is tolerating knee length wraps during visit interval. Leg belo the knee is well hydrated with increased skin flexibility from   just distal to knee and extending ~  8 -10 cm. Distal leg remains brawny but is palpably softer.  Pt  edu for LE self care included a discussion for best way to manage wraps during summer season when weather is quite warm and also when attending social events. Pt advised on best practice considering chronic , progressive trajectory of LE, but decision was left to her to make at the time.    Occupational performance deficits (Please refer to evaluation for details):  ADL's;IADL's;Rest and Sleep;Work;Leisure;Social Participation;Other body imageimpaired by leg swelling, increased falols risk 2/2 body asymmetry    Rehab Potential  Good    OT Frequency  3x / week    OT Duration  12 weeks    OT Treatment/Interventions  Self-care/ADL training;Therapeutic exercise;Manual lymph drainage;Compression bandaging;Patient/family education;Other (comment);Therapeutic activities;DME and/or AE instruction;Manual Therapy BLE skin care to improve skin hydration and decrease infection risk    Clinical Decision Making  Several treatment options, min-mod task modification necessary    OT Home Exercise Plan  lymphatic pumping ther ex : 1 set of 10 , in order, 2 x daily, BLE,     Recommended Other Services  fit with comfortable compression garments that are effective for controlling leg swelling , comfortable and easy to don and doff using assistive devices    Consulted and Agree with Plan of Care  Patient;Family member/caregiver       Patient will benefit from skilled therapeutic intervention in order to improve the following deficits and impairments:  Abnormal gait, Decreased knowledge of use of DME, Decreased skin integrity, Increased edema, Impaired flexibility, Pain, Decreased mobility, Impaired sensation, Decreased activity tolerance, Decreased range of motion, Decreased strength, Increased muscle spasms, Decreased balance, Decreased knowledge of precautions, Difficulty walking, Impaired UE functional use  Visit Diagnosis: Lymphedema, not elsewhere classified    Problem List Patient Active Problem List    Diagnosis Date Noted  . Abnormal glucose level 02/12/2018  . Abnormal weight gain 02/12/2018  . Allergic rhinitis 02/12/2018  . Asthma 02/12/2018  . Bradycardia 02/12/2018  . Edema 02/12/2018  . Heart murmur 02/12/2018  . Hypercalcemia 02/12/2018  . Hypokalemia 02/12/2018  . Pure hypercholesterolemia 02/12/2018  . Shoulder joint pain 02/12/2018  . Skin sensation disturbance 02/12/2018  . Syncope 02/12/2018  . Vitamin D deficiency 02/12/2018  . 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 Muscle spasticity 04/24/2017  . Weight loss 12/19/2016  . Hypothyroidism 11/24/2016  . Bilateral leg edema 11/24/2016  . Cervical radiculopathy 11/24/2016  . Disc degeneration, lumbar 11/24/2016  . Essential hypertension 11/24/2016  . Gallstones 11/24/2016  . History of anxiety state 11/24/2016  . Hyperlipidemia, unspecified 11/24/2016  . Spinal stenosis of lumbar region with neurogenic claudication 11/08/2016  . Anxiety 06/08/2015    Loel Dubonnetheresa Taye Cato, MS, OTR/L, [facility] 04/06/18 5:57 PM   [facility] [address] [place], [place], [address] Phone: [phone]   Fax:  [phone]  Name: Evelyn Cisneros MRN: [id number] Date of Birth: 02/25/1941

## 2018-04-07 ENCOUNTER — Encounter: Payer: Medicare Other | Admitting: Occupational Therapy

## 2018-04-08 ENCOUNTER — Ambulatory Visit: Payer: Medicare Other | Admitting: Occupational Therapy

## 2018-04-08 DIAGNOSIS — I890 Lymphedema, not elsewhere classified: Secondary | ICD-10-CM

## 2018-04-09 ENCOUNTER — Ambulatory Visit: Payer: Medicare Other | Admitting: Occupational Therapy

## 2018-04-09 DIAGNOSIS — I890 Lymphedema, not elsewhere classified: Secondary | ICD-10-CM | POA: Diagnosis not present

## 2018-04-09 NOTE — Therapy (Signed)
[place] MAIN [facility] SERVICES [address] [place], [place], [address] Phone: [phone]   Fax:  [phone]  Occupational Therapy Treatment  Patient Details  Name: Evelyn Cisneros MRN: [id number] Date of Birth: November 25, 1940 Referring Provider: Clarisse Gouge, MD   Encounter Date: 04/08/2018  OT End of Session - 04/08/18 1203    Visit Number  6    Number of Visits  36    Date for OT Re-Evaluation  06/28/18    OT Start Time  1101    OT Stop Time  0100    OT Time Calculation (min)  63 min    Activity Tolerance  Patient tolerated treatment well;No increased pain;Patient limited by pain    Behavior During Therapy  [facility] for tasks assessed/performed       Past Medical History:  Diagnosis Date  . Arthritis   . Hypertension     Past Surgical History:  Procedure Laterality Date  . ABDOMINAL HYSTERECTOMY    . REPLACEMENT TOTAL KNEE BILATERAL    . SPINE SURGERY      There were no vitals filed for this visit.  Subjective Assessment - 04/08/18 1102    Subjective   Pt arrived   for OT visit #5/36 to address BLE LE, R>L. Pt presents with knee length RLE compression wraps in place. She is accompanied by her spouse. Pt reports wrapping over the weekend was not too difficult. Pt asks of she can remove wraps next weekend to attend a social event.    Patient is accompained by:  Family member    Patient Stated Goals  Get the swelling down so I can move better  and so my legs dont hurt as much.    Currently in Pain?  -- chrnic pain unchanged. Not rated numerically today    Pain Onset  More than a month ago          LYMPHEDEMA/ONCOLOGY QUESTIONNAIRE - 04/09/18 1227      Right Lower Extremity Lymphedema   Other  RLE A-D (ankle to  tibial tuberosity) measures 2207.93 ml. RLE E-G (thigh) volume measures 4442.91 ml. RLE full leg (A-G) measures 6650.83 ml.    Other  RLE leg segment (A-D) limb volume is reduced by 42.68% since commencing  OT for CDT on  66-11-19. 10% limb volume reduction goal met and exceeded nearly 5 x. RLE thigh segemnt (E-G) is decreased in limb volume by 21% when measured today. RLE full leg limb volume (A-G) is reduced by 29.81% since commencing Rx. !0 % reduction goal also met for thigh and full leg. Excellent outcome for 2 weeks  worth of threatment.               OT Treatments/Exercises (OP) - 04/09/18 0001      Manual Therapy   Manual Therapy  Edema management;Manual Lymphatic Drainage (MLD);Compression Bandaging    Manual therapy comments  RLE comparative limb volumetrics    Manual Lymphatic Drainage (MLD)  Performed MLD for LE sequence utilizing short neck, core lymphatics   and functional inguinal LN.     Compression Bandaging  2 layer RLE compression wraps today vs 3 layer as leg is well decongested below the knee.             OT Education - 04/08/18 1232    Education Details  Cont Pt and spouse edu for long term effects of LE and progression over time without self management. Reviewed importance of consistent compression for optimal  clinical outcome and for optimal onging self management.  Encouraged Pt to continue to apply wraps, despite upcomming social event.  Pt understand OT will leave that decision up to patient  . She understands she will see increase in swelling  . Cont as per POC.    Person(s) Educated  Patient;Spouse    Methods  Explanation;Demonstration;Tactile cues;Verbal cues;Handout    Comprehension  Verbalized understanding;Returned demonstration;Verbal cues required;Tactile cues required;Need further instruction          OT Long Term Goals - 03/30/18 1618      OT LONG TERM GOAL #1   Title  Pt independent w/ lymphedema precautions/prevention principals and using printed reference to limit LE progression and infection risk.    Baseline  Max I    Time  2    Period  Weeks    Status  New    Target Date  -- 10th OT Rx visit      OT LONG TERM GOAL #2   Title  Lymphedema (LE)  management/ self-care: Pt able to apply knee/ thigh length gradient compression wraps to using correct techniques with max assist from spouse  within 2 weeks to achieve optimal limb volume reduction during Intensive Phase of CDT and optimal self-management over time.    Baseline  dependent    Time  2    Period  Weeks    Status  New    Target Date  -- 10th OT Rx visit      OT LONG TERM GOAL #3   Title  Lymphedema (LE) management/ self-care:  Pt to achieve at least 15%  limb volume reduction in  RLE, and 10 % limb volume reduction in LLE during Intensive Phase CDT to limit LE progression, to decrease infection risk and to improve functional performance in all occupational domains.     Baseline  Max A    Time  12    Period  Weeks    Status  New    Target Date  06/27/18      OT LONG TERM GOAL #4   Title  Lymphedema (LE) management/ self-care:  Pt to tolerate daily compression garments and/ or HOS devices in keeping w/ prescribed wear regime within 1 week of issue date to restore normal limb shape to reduce tissue density and protein build up leading to progression.    Baseline  max A    Time  12    Period  Weeks    Status  New    Target Date  06/27/18      OT LONG TERM GOAL #5   Title  Lymphedema (LE) Pain: Pt to report 25% reduction in discomfort/ pain described as LE  "heaviness", "fullness", tightness"" by DC to facilitate improved functional mobility,  ambulation, and transfers essential for safety and performance of basic and instrumental ADLs.     Baseline  Max A    Time  12    Period  Weeks    Status  New    Target Date  06/29/18      Long Term Additional Goals   Additional Long Term Goals  Yes      OT LONG TERM GOAL #6   Title  Lymphedema (LE) management/ self-care:  During Management Phase CDT Pt to sustain reduced limb volumes achieved during Intensive Phase CDT at follow up visits during Management Phase CDT within 3% using LE self-care home program components as directed (  simple self MLD, skin care, ther ex and  daily/ nightly compression garments/ devices) .    Baseline  Max A    Time  6    Period  Months    Status  New    Target Date  09/27/18            Plan - 04/08/18 1232    Clinical Impression Statement  RLE leg segment (A-D) limb volume is reduced by 42.68% since commencing  OT for CDT on 66-11-19. 10% limb volume reduction goal met and exceeded nearly 5 x. RLE thigh segemnt (E-G) is decreased in limb volume by 21% when measured today. RLE full leg limb volume (A-G) is reduced by 29.81% since commencing Rx. !0 % reduction goal also met for thigh and full leg. Excellent outcome for 2 weeks  worth of threatment.  Pt tolerated manual therapy today without difficulty. Spouse is independent with compression wrap application. Cont as per POC.    Occupational performance deficits (Please refer to evaluation for details):  ADL's;IADL's;Rest and Sleep;Work;Leisure;Social Participation;Other body imageimpaired by leg swelling, increased falols risk 2/2 body asymmetry    Rehab Potential  Good    OT Frequency  3x / week    OT Duration  12 weeks    OT Treatment/Interventions  Self-care/ADL training;Therapeutic exercise;Manual lymph drainage;Compression bandaging;Patient/family education;Other (comment);Therapeutic activities;DME and/or AE instruction;Manual Therapy BLE skin care to improve skin hydration and decrease infection risk    Clinical Decision Making  Several treatment options, min-mod task modification necessary    OT Home Exercise Plan  lymphatic pumping ther ex : 1 set of 10 , in order, 2 x daily, BLE,     Recommended Other Services  fit with comfortable compression garments that are effective for controlling leg swelling , comfortable and easy to don and doff using assistive devices    Consulted and Agree with Plan of Care  Patient;Family member/caregiver       Patient will benefit from skilled therapeutic intervention in order to improve the  following deficits and impairments:  Abnormal gait, Decreased knowledge of use of DME, Decreased skin integrity, Increased edema, Impaired flexibility, Pain, Decreased mobility, Impaired sensation, Decreased activity tolerance, Decreased range of motion, Decreased strength, Increased muscle spasms, Decreased balance, Decreased knowledge of precautions, Difficulty walking, Impaired UE functional use  Visit Diagnosis: Lymphedema, not elsewhere classified    Problem List Patient Active Problem List   Diagnosis Date Noted  . Abnormal glucose level 02/12/2018  . Abnormal weight gain 02/12/2018  . Allergic rhinitis 02/12/2018  . Asthma 02/12/2018  . Bradycardia 02/12/2018  . Edema 02/12/2018  . Heart murmur 02/12/2018  . Hypercalcemia 02/12/2018  . Hypokalemia 02/12/2018  . Pure hypercholesterolemia 02/12/2018  . Shoulder joint pain 02/12/2018  . Skin sensation disturbance 02/12/2018  . Syncope 02/12/2018  . Vitamin D deficiency 02/12/2018  . 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 Muscle spasticity 04/24/2017  . Weight loss 12/19/2016  . Hypothyroidism 11/24/2016  . Bilateral leg edema 11/24/2016  . Cervical radiculopathy 11/24/2016  . Disc degeneration, lumbar 11/24/2016  . Essential hypertension 11/24/2016  . Gallstones 11/24/2016  . History of anxiety state 11/24/2016  . Hyperlipidemia, unspecified 11/24/2016  . Spinal stenosis of lumbar region with neurogenic claudication 11/08/2016  . Anxiety 06/08/2015    Andrey Spearman, MS, OTR/L, [facility] 04/09/18 12:36 PM  [place] MAIN [facility] SERVICES [address] [place], [place], [address] Phone: [phone]   Fax:  [phone]  Name: Evelyn Cisneros MRN: [id number] Date of  Birth: 30-Apr-1941

## 2018-04-09 NOTE — Therapy (Signed)
[place] MAIN [facility] SERVICES [address] [place], [place], [address] Phone: [phone]   Fax:  [phone]  Occupational Therapy Treatment  Patient Details  Name: Evelyn Cisneros MRN: [id number] Date of Birth: 01-01-1941 Referring Provider: Clarisse Gouge, MD   Encounter Date: 04/09/2018  OT End of Session - 04/09/18 1737    Visit Number  7    Number of Visits  36    Date for OT Re-Evaluation  06/28/18    OT Start Time  0100    OT Stop Time  0200    OT Time Calculation (min)  60 min    Activity Tolerance  Patient tolerated treatment well;No increased pain;Patient limited by pain    Behavior During Therapy  [facility] for tasks assessed/performed       Past Medical History:  Diagnosis Date  . Arthritis   . Hypertension     Past Surgical History:  Procedure Laterality Date  . ABDOMINAL HYSTERECTOMY    . REPLACEMENT TOTAL KNEE BILATERAL    . SPINE SURGERY      There were no vitals filed for this visit.  Subjective Assessment - 04/09/18 1733    Subjective   Pt arrived   for OT visit #6/36 to address BLE LE, R>L. Pt presents with knee length RLE compression wraps in place. She is accompanied by her spouse. Pt has no new complaints today.    Patient is accompained by:  Family member    Patient Stated Goals  Get the swelling down so I can move better  and so my legs dont hurt as much.    Currently in Pain?  Yes chromic pain unchanged. not rated numerically    Pain Location  Leg    Pain Orientation  Right;Left    Pain Descriptors / Indicators  Numbness;Tightness;Guarding;Sore;Tingling;Heaviness;Tender    Pain Type  Chronic pain    Pain Onset  More than a month ago          LYMPHEDEMA/ONCOLOGY QUESTIONNAIRE - 04/09/18 1227      Right Lower Extremity Lymphedema   Other  RLE A-D (ankle to  tibial tuberosity) measures 2207.93 ml. RLE E-G (thigh) volume measures 4442.91 ml. RLE full leg (A-G) measures 6650.83 ml.    Other  RLE leg  segment (A-D) limb volume is reduced by 42.68% since commencing  OT for CDT on 66-11-19. 10% limb volume reduction goal met and exceeded nearly 5 x. RLE thigh segemnt (E-G) is decreased in limb volume by 21% when measured today. RLE full leg limb volume (A-G) is reduced by 29.81% since commencing Rx. !0 % reduction goal also met for thigh and full leg. Excellent outcome for 2 weeks  worth of threatment.               OT Treatments/Exercises (OP) - 04/09/18 1735      ADLs   ADL Education Given  Yes      Manual Therapy   Manual Therapy  Edema management;Manual Lymphatic Drainage (MLD);Compression Bandaging    Manual Lymphatic Drainage (MLD)  Performed MLD for LE sequence utilizing short neck, core lymphatics   and functional inguinal LN.     Compression Bandaging  2 layer RLE compression wraps today vs 3 layer as leg is well decongested below the knee.             OT Education - 04/09/18 1736    Education Details  Pt and spous edu for indications for use of rolling walker  vs cane, including center of gravity and widening base of support, improved upright posture- to limit falls riskand improve safe mobility    Person(s) Educated  Patient;Spouse    Methods  Explanation;Demonstration;Tactile cues;Verbal cues;Handout    Comprehension  Verbalized understanding;Returned demonstration;Verbal cues required;Tactile cues required;Need further instruction          OT Long Term Goals - 03/30/18 1618      OT LONG TERM GOAL #1   Title  Pt independent w/ lymphedema precautions/prevention principals and using printed reference to limit LE progression and infection risk.    Baseline  Max I    Time  2    Period  Weeks    Status  New    Target Date  -- 10th OT Rx visit      OT LONG TERM GOAL #2   Title  Lymphedema (LE) management/ self-care: Pt able to apply knee/ thigh length gradient compression wraps to using correct techniques with max assist from spouse  within 2 weeks to achieve  optimal limb volume reduction during Intensive Phase of CDT and optimal self-management over time.    Baseline  dependent    Time  2    Period  Weeks    Status  New    Target Date  -- 10th OT Rx visit      OT LONG TERM GOAL #3   Title  Lymphedema (LE) management/ self-care:  Pt to achieve at least 15%  limb volume reduction in  RLE, and 10 % limb volume reduction in LLE during Intensive Phase CDT to limit LE progression, to decrease infection risk and to improve functional performance in all occupational domains.     Baseline  Max A    Time  12    Period  Weeks    Status  New    Target Date  06/27/18      OT LONG TERM GOAL #4   Title  Lymphedema (LE) management/ self-care:  Pt to tolerate daily compression garments and/ or HOS devices in keeping w/ prescribed wear regime within 1 week of issue date to restore normal limb shape to reduce tissue density and protein build up leading to progression.    Baseline  max A    Time  12    Period  Weeks    Status  New    Target Date  06/27/18      OT LONG TERM GOAL #5   Title  Lymphedema (LE) Pain: Pt to report 25% reduction in discomfort/ pain described as LE  "heaviness", "fullness", tightness"" by DC to facilitate improved functional mobility,  ambulation, and transfers essential for safety and performance of basic and instrumental ADLs.     Baseline  Max A    Time  12    Period  Weeks    Status  New    Target Date  06/29/18      Long Term Additional Goals   Additional Long Term Goals  Yes      OT LONG TERM GOAL #6   Title  Lymphedema (LE) management/ self-care:  During Management Phase CDT Pt to sustain reduced limb volumes achieved during Intensive Phase CDT at follow up visits during Management Phase CDT within 3% using LE self-care home program components as directed ( simple self MLD, skin care, ther ex and daily/ nightly compression garments/ devices) .    Baseline  Max A    Time  6    Period  Months  Status  New    Target  Date  09/27/18            Plan - 04/09/18 1738    Clinical Impression Statement  Pt tolerated all aspects of manual CDT to RLE today without difficulty. Pt and spouse verbalized of recommendations for improved upright posture and correct use of walker to limit falls.  Next visit complete anatomical measurements for RLE custom compression garment. Cont as per POC.    Occupational performance deficits (Please refer to evaluation for details):  ADL's;IADL's;Rest and Sleep;Work;Leisure;Social Participation;Other body imageimpaired by leg swelling, increased falols risk 2/2 body asymmetry    Rehab Potential  Good    OT Frequency  3x / week    OT Duration  12 weeks    OT Treatment/Interventions  Self-care/ADL training;Therapeutic exercise;Manual lymph drainage;Compression bandaging;Patient/family education;Other (comment);Therapeutic activities;DME and/or AE instruction;Manual Therapy BLE skin care to improve skin hydration and decrease infection risk    Clinical Decision Making  Several treatment options, min-mod task modification necessary    OT Home Exercise Plan  lymphatic pumping ther ex : 1 set of 10 , in order, 2 x daily, BLE,     Recommended Other Services  fit with comfortable compression garments that are effective for controlling leg swelling , comfortable and easy to don and doff using assistive devices    Consulted and Agree with Plan of Care  Patient;Family member/caregiver       Patient will benefit from skilled therapeutic intervention in order to improve the following deficits and impairments:  Abnormal gait, Decreased knowledge of use of DME, Decreased skin integrity, Increased edema, Impaired flexibility, Pain, Decreased mobility, Impaired sensation, Decreased activity tolerance, Decreased range of motion, Decreased strength, Increased muscle spasms, Decreased balance, Decreased knowledge of precautions, Difficulty walking, Impaired UE functional use  Visit  Diagnosis: Lymphedema, not elsewhere classified    Problem List Patient Active Problem List   Diagnosis Date Noted  . Abnormal glucose level 02/12/2018  . Abnormal weight gain 02/12/2018  . Allergic rhinitis 02/12/2018  . Asthma 02/12/2018  . Bradycardia 02/12/2018  . Edema 02/12/2018  . Heart murmur 02/12/2018  . Hypercalcemia 02/12/2018  . Hypokalemia 02/12/2018  . Pure hypercholesterolemia 02/12/2018  . Shoulder joint pain 02/12/2018  . Skin sensation disturbance 02/12/2018  . Syncope 02/12/2018  . Vitamin D deficiency 02/12/2018  . 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 Muscle spasticity 04/24/2017  . Weight loss 12/19/2016  . Hypothyroidism 11/24/2016  . Bilateral leg edema 11/24/2016  . Cervical radiculopathy 11/24/2016  . Disc degeneration, lumbar 11/24/2016  . Essential hypertension 11/24/2016  . Gallstones 11/24/2016  . History of anxiety state 11/24/2016  . Hyperlipidemia, unspecified 11/24/2016  . Spinal stenosis of lumbar region with neurogenic claudication 11/08/2016  . Anxiety 06/08/2015    Andrey Spearman, MS, OTR/L, [facility] 04/09/18 5:40 PM  [place] MAIN [facility] SERVICES [address] [place], [place], [address] Phone: [phone]   Fax:  [phone]  Name: Chantavia Bazzle MRN: [id number] Date of Birth: 1941/10/05

## 2018-04-14 ENCOUNTER — Ambulatory Visit: Payer: Medicare Other | Admitting: Occupational Therapy

## 2018-04-14 DIAGNOSIS — I890 Lymphedema, not elsewhere classified: Secondary | ICD-10-CM

## 2018-04-14 NOTE — Therapy (Signed)
[place] [facility] [address] [place], [place], [address] Phone: [phone]   Fax:  [phone]  Occupational Therapy Treatment  Patient Details  Name: Sheral ApleyLois Gibbs MRN: [id number] Date of Birth: 09/18/1941 Referring Provider: Verner MouldVickie A Fowler, MD   Encounter Date: 04/14/2018  OT End of Session - 04/14/18 1740    Visit Number  8    Number of Visits  36    Date for OT Re-Evaluation  06/28/18    OT Start Time  0202    OT Stop Time  0302    OT Time Calculation (min)  60 min    Activity Tolerance  Patient tolerated treatment well;No increased pain;Patient limited by pain    Behavior During Therapy  [facility] for tasks assessed/performed       Past Medical History:  Diagnosis Date  . Arthritis   . Hypertension     Past Surgical History:  Procedure Laterality Date  . ABDOMINAL HYSTERECTOMY    . REPLACEMENT TOTAL KNEE BILATERAL    . SPINE SURGERY      There were no vitals filed for this visit.  Subjective Assessment - 04/14/18 1405    Subjective   Pt arrived   for OT visit #7/36 to address BLE LE, R>L. Pt presents with knee length RLE compression wraps in place. She is accompanied by her spouse. Pt has no complaints concerning her legs today.     Patient is accompained by:  Family member    Patient Stated Goals  Get the swelling down so I can move better  and so my legs dont hurt as much.    Currently in Pain?  Yes chronic hip and back pain unchanged    Pain Onset  More than a month ago                   OT Treatments/Exercises (OP) - 04/14/18 0001      ADLs   ADL Education Given  Yes      Manual Therapy   Manual Therapy  Edema management;Manual Lymphatic Drainage (MLD);Compression Bandaging    Compression Bandaging  2 layer RLE compression wraps today vs 3 layer as leg is well decongested below the knee.             OT Education - 04/14/18 1732    Education Details  Pt and spouse edu for  compression garments wear and care, options for construction, and fitting proces.    Person(s) Educated  Patient;Spouse    Methods  Explanation;Demonstration;Tactile cues;Verbal cues;Handout    Comprehension  Verbalized understanding;Returned demonstration;Verbal cues required;Tactile cues required;Need further instruction          OT Long Term Goals - 03/30/18 1618      OT LONG TERM GOAL #1   Title  Pt independent w/ lymphedema precautions/prevention principals and using printed reference to limit LE progression and infection risk.    Baseline  Max I    Time  2    Period  Weeks    Status  New    Target Date  -- 10th OT Rx visit      OT LONG TERM GOAL #2   Title  Lymphedema (LE) management/ self-care: Pt able to apply knee/ thigh length gradient compression wraps to using correct techniques with max assist from spouse  within 2 weeks to achieve optimal limb volume reduction during Intensive Phase of CDT and optimal self-management over time.    Baseline  dependent  Time  2    Period  Weeks    Status  New    Target Date  -- 10th OT Rx visit      OT LONG TERM GOAL #3   Title  Lymphedema (LE) management/ self-care:  Pt to achieve at least 15%  limb volume reduction in  RLE, and 10 % limb volume reduction in LLE during Intensive Phase CDT to limit LE progression, to decrease infection risk and to improve functional performance in all occupational domains.     Baseline  Max A    Time  12    Period  Weeks    Status  New    Target Date  06/27/18      OT LONG TERM GOAL #4   Title  Lymphedema (LE) management/ self-care:  Pt to tolerate daily compression garments and/ or HOS devices in keeping w/ prescribed wear regime within 1 week of issue date to restore normal limb shape to reduce tissue density and protein build up leading to progression.    Baseline  max A    Time  12    Period  Weeks    Status  New    Target Date  06/27/18      OT LONG TERM GOAL #5   Title  Lymphedema (LE)  Pain: Pt to report 25% reduction in discomfort/ pain described as LE  "heaviness", "fullness", tightness"" by DC to facilitate improved functional mobility,  ambulation, and transfers essential for safety and performance of basic and instrumental ADLs.     Baseline  Max A    Time  12    Period  Weeks    Status  New    Target Date  06/29/18      Long Term Additional Goals   Additional Long Term Goals  Yes      OT LONG TERM GOAL #6   Title  Lymphedema (LE) management/ self-care:  During Management Phase CDT Pt to sustain reduced limb volumes achieved during Intensive Phase CDT at follow up visits during Management Phase CDT within 3% using LE self-care home program components as directed ( simple self MLD, skin care, ther ex and daily/ nightly compression garments/ devices) .    Baseline  Max A    Time  6    Period  Months    Status  New    Target Date  09/27/18            Plan - 04/14/18 1741    Clinical Impression Statement  Completed anatomical measurements for RLE custom compression knee highs and HOS device., Submitted to vendor by fax. Applied compression garments as establishedf. Cont as per zPOC.    Occupational performance deficits (Please refer to evaluation for details):  ADL's;IADL's;Rest and Sleep;Work;Leisure;Social Participation;Other body imageimpaired by leg swelling, increased falols risk 2/2 body asymmetry    Rehab Potential  Good    OT Frequency  3x / week    OT Duration  12 weeks    OT Treatment/Interventions  Self-care/ADL training;Therapeutic exercise;Manual lymph drainage;Compression bandaging;Patient/family education;Other (comment);Therapeutic activities;DME and/or AE instruction;Manual Therapy BLE skin care to improve skin hydration and decrease infection risk    Clinical Decision Making  Several treatment options, min-mod task modification necessary    OT Home Exercise Plan  lymphatic pumping ther ex : 1 set of 10 , in order, 2 x daily, BLE,     Recommended  Other Services  fit with comfortable compression garments that are effective for controlling leg  swelling , comfortable and easy to don and doff using assistive devices    Consulted and Agree with Plan of Care  Patient;Family member/caregiver       Patient will benefit from skilled therapeutic intervention in order to improve the following deficits and impairments:  Abnormal gait, Decreased knowledge of use of DME, Decreased skin integrity, Increased edema, Impaired flexibility, Pain, Decreased mobility, Impaired sensation, Decreased activity tolerance, Decreased range of motion, Decreased strength, Increased muscle spasms, Decreased balance, Decreased knowledge of precautions, Difficulty walking, Impaired UE functional use  Visit Diagnosis: Lymphedema, not elsewhere classified    Problem List Patient Active Problem List   Diagnosis Date Noted  . Abnormal glucose level 02/12/2018  . Abnormal weight gain 02/12/2018  . Allergic rhinitis 02/12/2018  . Asthma 02/12/2018  . Bradycardia 02/12/2018  . Edema 02/12/2018  . Heart murmur 02/12/2018  . Hypercalcemia 02/12/2018  . Hypokalemia 02/12/2018  . Pure hypercholesterolemia 02/12/2018  . Shoulder joint pain 02/12/2018  . Skin sensation disturbance 02/12/2018  . Syncope 02/12/2018  . Vitamin D deficiency 02/12/2018  . 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 Muscle spasticity 04/24/2017  . Weight loss 12/19/2016  . Hypothyroidism 11/24/2016  . Bilateral leg edema 11/24/2016  . Cervical radiculopathy 11/24/2016  . Disc degeneration, lumbar 11/24/2016  . Essential hypertension 11/24/2016  . Gallstones 11/24/2016  . History of anxiety state 11/24/2016  . Hyperlipidemia, unspecified 11/24/2016  . Spinal stenosis of lumbar region with neurogenic claudication 11/08/2016  . Anxiety 06/08/2015    Loel Dubonnet, MS, OTR/L, [facility] 04/14/18 5:43 PM  [place] [facility] MAIN [facility] SERVICES [address] [place], [place], [address] Phone: [phone]   Fax:  [phone]  Name: Esmay Amspacher MRN: [id number] Date of Birth: [birth year]

## 2018-04-15 ENCOUNTER — Ambulatory Visit: Payer: Medicare Other | Admitting: Occupational Therapy

## 2018-04-15 DIAGNOSIS — I890 Lymphedema, not elsewhere classified: Secondary | ICD-10-CM

## 2018-04-16 ENCOUNTER — Ambulatory Visit: Payer: Medicare Other | Admitting: Occupational Therapy

## 2018-04-16 DIAGNOSIS — I890 Lymphedema, not elsewhere classified: Secondary | ICD-10-CM | POA: Diagnosis not present

## 2018-04-16 NOTE — Therapy (Signed)
[facility] [facility] MAIN [facility] SERVICES [address] [place], [place], [address] Phone: [phone]   Fax:  [phone]  Occupational Therapy Treatment  Patient Details  Name: Evelyn Cisneros MRN: [id number] Date of Birth: 11/30/1940 Referring Provider: Verner Mould, MD   Encounter Date: 04/15/2018  OT End of Session - 04/16/18 1310    Visit Number  9    Number of Visits  36    Date for OT Re-Evaluation  06/28/18    OT Start Time  0105    Activity Tolerance  Patient tolerated treatment well;No increased pain;Patient limited by pain    Behavior During Therapy  [facility] for tasks assessed/performed       Past Medical History:  Diagnosis Date  . Arthritis   . Hypertension     Past Surgical History:  Procedure Laterality Date  . ABDOMINAL HYSTERECTOMY    . REPLACEMENT TOTAL KNEE BILATERAL    . SPINE SURGERY      There were no vitals filed for this visit.  Subjective Assessment - 04/16/18 1310    Subjective   Pt arrived   for OT visit #9/36 to address BLE LE, R>L. Pt presents with knee length RLE compression wraps in place. She is accompanied by her spouse. Pt reports, "My leg is kinda sore right here in the front and on the side."    Patient is accompained by:  Family member    Patient Stated Goals  Get the swelling down so I can move better  and so my legs dont hurt as much.    Currently in Pain?  Yes    Pain Score  2     Pain Location  Leg    Pain Descriptors / Indicators  Sore    Pain Type  Chronic pain    Pain Onset  More than a month ago                           OT Education - 04/16/18 1741    Education Details  Continued skilled Pt/caregiver education  And LE ADL training throughout visit for lymphedema self care/ home program, including compression wrapping, compression garment and device wear/care, lymphatic pumping ther ex, simple self-MLD, and skin care. Discussed progress towards goals.     Person(s) Educated   Patient;Spouse    Methods  Explanation;Demonstration;Tactile cues;Verbal cues;Handout    Comprehension  Verbalized understanding;Returned demonstration;Verbal cues required;Tactile cues required;Need further instruction          OT Long Term Goals - 03/30/18 1618      OT LONG TERM GOAL #1   Title  Pt independent w/ lymphedema precautions/prevention principals and using printed reference to limit LE progression and infection risk.    Baseline  Max I    Time  2    Period  Weeks    Status  New    Target Date  -- 10th OT Rx visit      OT LONG TERM GOAL #2   Title  Lymphedema (LE) management/ self-care: Pt able to apply knee/ thigh length gradient compression wraps to using correct techniques with max assist from spouse  within 2 weeks to achieve optimal limb volume reduction during Intensive Phase of CDT and optimal self-management over time.    Baseline  dependent    Time  2    Period  Weeks    Status  New    Target Date  -- 10th OT  Rx visit      OT LONG TERM GOAL #3   Title  Lymphedema (LE) management/ self-care:  Pt to achieve at least 15%  limb volume reduction in  RLE, and 10 % limb volume reduction in LLE during Intensive Phase CDT to limit LE progression, to decrease infection risk and to improve functional performance in all occupational domains.     Baseline  Max A    Time  12    Period  Weeks    Status  New    Target Date  06/27/18      OT LONG TERM GOAL #4   Title  Lymphedema (LE) management/ self-care:  Pt to tolerate daily compression garments and/ or HOS devices in keeping w/ prescribed wear regime within 1 week of issue date to restore normal limb shape to reduce tissue density and protein build up leading to progression.    Baseline  max A    Time  12    Period  Weeks    Status  New    Target Date  06/27/18      OT LONG TERM GOAL #5   Title  Lymphedema (LE) Pain: Pt to report 25% reduction in discomfort/ pain described as LE  "heaviness", "fullness",  tightness"" by DC to facilitate improved functional mobility,  ambulation, and transfers essential for safety and performance of basic and instrumental ADLs.     Baseline  Max A    Time  12    Period  Weeks    Status  New    Target Date  06/29/18      Long Term Additional Goals   Additional Long Term Goals  Yes      OT LONG TERM GOAL #6   Title  Lymphedema (LE) management/ self-care:  During Management Phase CDT Pt to sustain reduced limb volumes achieved during Intensive Phase CDT at follow up visits during Management Phase CDT within 3% using LE self-care home program components as directed ( simple self MLD, skin care, ther ex and daily/ nightly compression garments/ devices) .    Baseline  Max A    Time  6    Period  Months    Status  New    Target Date  09/27/18            Plan - 04/16/18 1741    Clinical Impression Statement  Provided MLD, skin care and compression wraps to RLE ni clinic today. Modified wraps to improve comfort by adding Artiflex cast padding circumferentially at distal leg in effort to reduce distaol compresion. (Laplace's Law)) Pt continues to make excellent progress towards all OT goals for CDT. Progress note next visit.    Occupational performance deficits (Please refer to evaluation for details):  ADL's;IADL's;Rest and Sleep;Work;Leisure;Social Participation;Other body imageimpaired by leg swelling, increased falols risk 2/2 body asymmetry    Rehab Potential  Good    OT Frequency  3x / week    OT Duration  12 weeks    OT Treatment/Interventions  Self-care/ADL training;Therapeutic exercise;Manual lymph drainage;Compression bandaging;Patient/family education;Other (comment);Therapeutic activities;DME and/or AE instruction;Manual Therapy BLE skin care to improve skin hydration and decrease infection risk    Clinical Decision Making  Several treatment options, min-mod task modification necessary    OT Home Exercise Plan  lymphatic pumping ther ex : 1 set of 10  , in order, 2 x daily, BLE,     Recommended Other Services  fit with comfortable compression garments that are effective for controlling leg  swelling , comfortable and easy to don and doff using assistive devices    Consulted and Agree with Plan of Care  Patient;Family member/caregiver       Patient will benefit from skilled therapeutic intervention in order to improve the following deficits and impairments:  Abnormal gait, Decreased knowledge of use of DME, Decreased skin integrity, Increased edema, Impaired flexibility, Pain, Decreased mobility, Impaired sensation, Decreased activity tolerance, Decreased range of motion, Decreased strength, Increased muscle spasms, Decreased balance, Decreased knowledge of precautions, Difficulty walking, Impaired UE functional use  Visit Diagnosis: Lymphedema, not elsewhere classified    Problem List Patient Active Problem List   Diagnosis Date Noted  . Abnormal glucose level 02/12/2018  . Abnormal weight gain 02/12/2018  . Allergic rhinitis 02/12/2018  . Asthma 02/12/2018  . Bradycardia 02/12/2018  . Edema 02/12/2018  . Heart murmur 02/12/2018  . Hypercalcemia 02/12/2018  . Hypokalemia 02/12/2018  . Pure hypercholesterolemia 02/12/2018  . Shoulder joint pain 02/12/2018  . Skin sensation disturbance 02/12/2018  . Syncope 02/12/2018  . Vitamin D deficiency 02/12/2018  . 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 Muscle spasticity 04/24/2017  . Weight loss 12/19/2016  . Hypothyroidism 11/24/2016  . Bilateral leg edema 11/24/2016  . Cervical radiculopathy 11/24/2016  . Disc degeneration, lumbar 11/24/2016  . Essential hypertension 11/24/2016  . Gallstones 11/24/2016  . History of anxiety state 11/24/2016  . Hyperlipidemia, unspecified 11/24/2016  . Spinal stenosis of lumbar region with neurogenic claudication 11/08/2016  . Anxiety 06/08/2015    Loel Dubonnetheresa Merl Guardino, MS, OTR/L, [facility] 04/16/18 6:20  PM  [place] [facility] [address] [place], [place], [address] Phone: [phone]   Fax:  [phone]  Name: Evelyn Cisneros MRN: [id number] Date of Birth: 08/08/1941

## 2018-04-16 NOTE — Therapy (Signed)
[place] [facility] [address] [place], [place], [address] Phone: [phone]   Fax:  [phone]  Occupational Therapy Treatment  Patient Details  Name: Evelyn Cisneros MRN: [id number] Date of Birth: 09/17/1941 Referring Provider: Verner MouldVickie A Fowler, MD   Encounter Date: 04/16/2018  OT End of Session - 04/16/18 1310    Visit Number  9    Number of Visits  36    Date for OT Re-Evaluation  06/28/18    OT Start Time  0105    Activity Tolerance  Patient tolerated treatment well;No increased pain;Patient limited by pain    Behavior During Therapy  [facility] for tasks assessed/performed       Past Medical History:  Diagnosis Date  . Arthritis   . Hypertension     Past Surgical History:  Procedure Laterality Date  . ABDOMINAL HYSTERECTOMY    . REPLACEMENT TOTAL KNEE BILATERAL    . SPINE SURGERY      There were no vitals filed for this visit.  Subjective Assessment - 04/16/18 1310    Subjective   Pt arrived   for OT visit #9/36 to address BLE LE, R>L. Pt presents with knee length RLE compression wraps in place. She is accompanied by her spouse. Pt reports, "My leg is kinda sore right here in the front and on the side."    Patient is accompained by:  Family member    Patient Stated Goals  Get the swelling down so I can move better  and so my legs dont hurt as much.    Currently in Pain?  Yes    Pain Score  2     Pain Location  Leg    Pain Descriptors / Indicators  Sore    Pain Type  Chronic pain    Pain Onset  More than a month ago                           OT Education - 04/16/18 1741    Education Details  Continued skilled Pt/caregiver education  And LE ADL training throughout visit for lymphedema self care/ home program, including compression wrapping, compression garment and device wear/care, lymphatic pumping ther ex, simple self-MLD, and skin care. Discussed progress towards goals.     Person(s) Educated   Patient;Spouse    Methods  Explanation;Demonstration;Tactile cues;Verbal cues;Handout    Comprehension  Verbalized understanding;Returned demonstration;Verbal cues required;Tactile cues required;Need further instruction          OT Long Term Goals - 03/30/18 1618      OT LONG TERM GOAL #1   Title  Pt independent w/ lymphedema precautions/prevention principals and using printed reference to limit LE progression and infection risk.    Baseline  Max I    Time  2    Period  Weeks    Status  New    Target Date  -- 10th OT Rx visit      OT LONG TERM GOAL #2   Title  Lymphedema (LE) management/ self-care: Pt able to apply knee/ thigh length gradient compression wraps to using correct techniques with max assist from spouse  within 2 weeks to achieve optimal limb volume reduction during Intensive Phase of CDT and optimal self-management over time.    Baseline  dependent    Time  2    Period  Weeks    Status  New    Target Date  -- 10th OT  Rx visit      OT LONG TERM GOAL #3   Title  Lymphedema (LE) management/ self-care:  Pt to achieve at least 15%  limb volume reduction in  RLE, and 10 % limb volume reduction in LLE during Intensive Phase CDT to limit LE progression, to decrease infection risk and to improve functional performance in all occupational domains.     Baseline  Max A    Time  12    Period  Weeks    Status  New    Target Date  06/27/18      OT LONG TERM GOAL #4   Title  Lymphedema (LE) management/ self-care:  Pt to tolerate daily compression garments and/ or HOS devices in keeping w/ prescribed wear regime within 1 week of issue date to restore normal limb shape to reduce tissue density and protein build up leading to progression.    Baseline  max A    Time  12    Period  Weeks    Status  New    Target Date  06/27/18      OT LONG TERM GOAL #5   Title  Lymphedema (LE) Pain: Pt to report 25% reduction in discomfort/ pain described as LE  "heaviness", "fullness",  tightness"" by DC to facilitate improved functional mobility,  ambulation, and transfers essential for safety and performance of basic and instrumental ADLs.     Baseline  Max A    Time  12    Period  Weeks    Status  New    Target Date  06/29/18      Long Term Additional Goals   Additional Long Term Goals  Yes      OT LONG TERM GOAL #6   Title  Lymphedema (LE) management/ self-care:  During Management Phase CDT Pt to sustain reduced limb volumes achieved during Intensive Phase CDT at follow up visits during Management Phase CDT within 3% using LE self-care home program components as directed ( simple self MLD, skin care, ther ex and daily/ nightly compression garments/ devices) .    Baseline  Max A    Time  6    Period  Months    Status  New    Target Date  09/27/18            Plan - 04/16/18 1741    Clinical Impression Statement  Provided MLD, skin care and compression wraps to RLE ni clinic today. Modified wraps to improve comfort by adding Artiflex cast padding circumferentially at distal leg in effort to reduce distaol compresion. (Laplace's Law)) Pt continues to make excellent progress towards all OT goals for CDT. Progress note next visit.    Occupational performance deficits (Please refer to evaluation for details):  ADL's;IADL's;Rest and Sleep;Work;Leisure;Social Participation;Other body imageimpaired by leg swelling, increased falols risk 2/2 body asymmetry    Rehab Potential  Good    OT Frequency  3x / week    OT Duration  12 weeks    OT Treatment/Interventions  Self-care/ADL training;Therapeutic exercise;Manual lymph drainage;Compression bandaging;Patient/family education;Other (comment);Therapeutic activities;DME and/or AE instruction;Manual Therapy BLE skin care to improve skin hydration and decrease infection risk    Clinical Decision Making  Several treatment options, min-mod task modification necessary    OT Home Exercise Plan  lymphatic pumping ther ex : 1 set of 10  , in order, 2 x daily, BLE,     Recommended Other Services  fit with comfortable compression garments that are effective for controlling leg  swelling , comfortable and easy to don and doff using assistive devices    Consulted and Agree with Plan of Care  Patient;Family member/caregiver       Patient will benefit from skilled therapeutic intervention in order to improve the following deficits and impairments:  Abnormal gait, Decreased knowledge of use of DME, Decreased skin integrity, Increased edema, Impaired flexibility, Pain, Decreased mobility, Impaired sensation, Decreased activity tolerance, Decreased range of motion, Decreased strength, Increased muscle spasms, Decreased balance, Decreased knowledge of precautions, Difficulty walking, Impaired UE functional use  Visit Diagnosis: Lymphedema, not elsewhere classified    Problem List Patient Active Problem List   Diagnosis Date Noted  . Abnormal glucose level 02/12/2018  . Abnormal weight gain 02/12/2018  . Allergic rhinitis 02/12/2018  . Asthma 02/12/2018  . Bradycardia 02/12/2018  . Edema 02/12/2018  . Heart murmur 02/12/2018  . Hypercalcemia 02/12/2018  . Hypokalemia 02/12/2018  . Pure hypercholesterolemia 02/12/2018  . Shoulder joint pain 02/12/2018  . Skin sensation disturbance 02/12/2018  . Syncope 02/12/2018  . Vitamin D deficiency 02/12/2018  . 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 Muscle spasticity 04/24/2017  . Weight loss 12/19/2016  . Hypothyroidism 11/24/2016  . Bilateral leg edema 11/24/2016  . Cervical radiculopathy 11/24/2016  . Disc degeneration, lumbar 11/24/2016  . Essential hypertension 11/24/2016  . Gallstones 11/24/2016  . History of anxiety state 11/24/2016  . Hyperlipidemia, unspecified 11/24/2016  . Spinal stenosis of lumbar region with neurogenic claudication 11/08/2016  . Anxiety 06/08/2015    Evelyn Dubonnet, MS, OTR/L, [facility] 04/16/18 5:44  PM  [place] [facility] MAIN [facility] SERVICES [address] [place], [place], [address] Phone: [phone]   Fax:  [phone]  Name: Evelyn Cisneros MRN: [id number] Date of Birth: 1941/10/14

## 2018-04-16 NOTE — Therapy (Signed)
[facility] MAIN [facility] [address] [place], [place], [address] Phone: [phone]   Fax:  [phone]  Occupational Therapy Treatment  Patient Details  Name: Evelyn Cisneros MRN: [id number] Date of Birth: 09/17/1941 Referring Provider: Verner MouldVickie A Fowler, MD   Encounter Date: 04/16/2018  OT End of Session - 04/16/18 1310    Visit Number  9    Number of Visits  36    Date for OT Re-Evaluation  06/28/18    OT Start Time  0105    Activity Tolerance  Patient tolerated treatment well;No increased pain;Patient limited by pain    Behavior During Therapy  [facility] for tasks assessed/performed       Past Medical History:  Diagnosis Date  . Arthritis   . Hypertension     Past Surgical History:  Procedure Laterality Date  . ABDOMINAL HYSTERECTOMY    . REPLACEMENT TOTAL KNEE BILATERAL    . SPINE SURGERY      There were no vitals filed for this visit.  Subjective Assessment - 04/16/18 1310    Subjective   Pt arrived   for OT visit #9/36 to address BLE LE, R>L. Pt presents with knee length RLE compression wraps in place. She is accompanied by her spouse. Pt reports, "My leg is kinda sore right here in the front and on the side."    Patient is accompained by:  Family member    Patient Stated Goals  Get the swelling down so I can move better  and so my legs dont hurt as much.    Currently in Pain?  Yes    Pain Score  2     Pain Location  Leg    Pain Descriptors / Indicators  Sore    Pain Type  Chronic pain    Pain Onset  More than a month ago                           OT Education - 04/16/18 1741    Education Details  Continued skilled Pt/caregiver education  And LE ADL training throughout visit for lymphedema self care/ home program, including compression wrapping, compression garment and device wear/care, lymphatic pumping ther ex, simple self-MLD, and skin care. Discussed progress towards goals.     Person(s) Educated   Patient;Spouse    Methods  Explanation;Demonstration;Tactile cues;Verbal cues;Handout    Comprehension  Verbalized understanding;Returned demonstration;Verbal cues required;Tactile cues required;Need further instruction          OT Long Term Goals - 03/30/18 1618      OT LONG TERM GOAL #1   Title  Pt independent w/ lymphedema precautions/prevention principals and using printed reference to limit LE progression and infection risk.    Baseline  Max I    Time  2    Period  Weeks    Status  New    Target Date  -- 10th OT Rx visit      OT LONG TERM GOAL #2   Title  Lymphedema (LE) management/ self-care: Pt able to apply knee/ thigh length gradient compression wraps to using correct techniques with max assist from spouse  within 2 weeks to achieve optimal limb volume reduction during Intensive Phase of CDT and optimal self-management over time.    Baseline  dependent    Time  2    Period  Weeks    Status  New    Target Date  -- 10th OT  Rx visit      OT LONG TERM GOAL #3   Title  Lymphedema (LE) management/ self-care:  Pt to achieve at least 15%  limb volume reduction in  RLE, and 10 % limb volume reduction in LLE during Intensive Phase CDT to limit LE progression, to decrease infection risk and to improve functional performance in all occupational domains.     Baseline  Max A    Time  12    Period  Weeks    Status  New    Target Date  06/27/18      OT LONG TERM GOAL #4   Title  Lymphedema (LE) management/ self-care:  Pt to tolerate daily compression garments and/ or HOS devices in keeping w/ prescribed wear regime within 1 week of issue date to restore normal limb shape to reduce tissue density and protein build up leading to progression.    Baseline  max A    Time  12    Period  Weeks    Status  New    Target Date  06/27/18      OT LONG TERM GOAL #5   Title  Lymphedema (LE) Pain: Pt to report 25% reduction in discomfort/ pain described as LE  "heaviness", "fullness",  tightness"" by DC to facilitate improved functional mobility,  ambulation, and transfers essential for safety and performance of basic and instrumental ADLs.     Baseline  Max A    Time  12    Period  Weeks    Status  New    Target Date  06/29/18      Long Term Additional Goals   Additional Long Term Goals  Yes      OT LONG TERM GOAL #6   Title  Lymphedema (LE) management/ self-care:  During Management Phase CDT Pt to sustain reduced limb volumes achieved during Intensive Phase CDT at follow up visits during Management Phase CDT within 3% using LE self-care home program components as directed ( simple self MLD, skin care, ther ex and daily/ nightly compression garments/ devices) .    Baseline  Max A    Time  6    Period  Months    Status  New    Target Date  09/27/18            Plan - 04/16/18 1741    Clinical Impression Statement  Provided MLD, skin care and compression wraps to RLE ni clinic today. Modified wraps to improve comfort by adding Artiflex cast padding circumferentially at distal leg in effort to reduce distaol compresion. (Laplace's Law)) Pt continues to make excellent progress towards all OT goals for CDT. Progress note next visit.    Occupational performance deficits (Please refer to evaluation for details):  ADL's;IADL's;Rest and Sleep;Work;Leisure;Social Participation;Other body imageimpaired by leg swelling, increased falols risk 2/2 body asymmetry    Rehab Potential  Good    OT Frequency  3x / week    OT Duration  12 weeks    OT Treatment/Interventions  Self-care/ADL training;Therapeutic exercise;Manual lymph drainage;Compression bandaging;Patient/family education;Other (comment);Therapeutic activities;DME and/or AE instruction;Manual Therapy BLE skin care to improve skin hydration and decrease infection risk    Clinical Decision Making  Several treatment options, min-mod task modification necessary    OT Home Exercise Plan  lymphatic pumping ther ex : 1 set of 10  , in order, 2 x daily, BLE,     Recommended Other Services  fit with comfortable compression garments that are effective for controlling leg  swelling , comfortable and easy to don and doff using assistive devices    Consulted and Agree with Plan of Care  Patient;Family member/caregiver       Patient will benefit from skilled therapeutic intervention in order to improve the following deficits and impairments:  Abnormal gait, Decreased knowledge of use of DME, Decreased skin integrity, Increased edema, Impaired flexibility, Pain, Decreased mobility, Impaired sensation, Decreased activity tolerance, Decreased range of motion, Decreased strength, Increased muscle spasms, Decreased balance, Decreased knowledge of precautions, Difficulty walking, Impaired UE functional use  Visit Diagnosis: Lymphedema, not elsewhere classified    Problem List Patient Active Problem List   Diagnosis Date Noted  . Abnormal glucose level 02/12/2018  . Abnormal weight gain 02/12/2018  . Allergic rhinitis 02/12/2018  . Asthma 02/12/2018  . Bradycardia 02/12/2018  . Edema 02/12/2018  . Heart murmur 02/12/2018  . Hypercalcemia 02/12/2018  . Hypokalemia 02/12/2018  . Pure hypercholesterolemia 02/12/2018  . Shoulder joint pain 02/12/2018  . Skin sensation disturbance 02/12/2018  . Syncope 02/12/2018  . Vitamin D deficiency 02/12/2018  . 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 Muscle spasticity 04/24/2017  . Weight loss 12/19/2016  . Hypothyroidism 11/24/2016  . Bilateral leg edema 11/24/2016  . Cervical radiculopathy 11/24/2016  . Disc degeneration, lumbar 11/24/2016  . Essential hypertension 11/24/2016  . Gallstones 11/24/2016  . History of anxiety state 11/24/2016  . Hyperlipidemia, unspecified 11/24/2016  . Spinal stenosis of lumbar region with neurogenic claudication 11/08/2016  . Anxiety 06/08/2015    Loel Dubonnet, MS, OTR/L, [facility] 04/16/18 5:45  PM  [place] [facility] MAIN [facility] SERVICES [address] [place], [place], [address] Phone: [phone]   Fax:  [phone]  Name: Evelyn Cisneros MRN: [id number] Date of Birth: 08-May-1941

## 2018-04-20 ENCOUNTER — Other Ambulatory Visit: Payer: Self-pay | Admitting: Neurosurgery

## 2018-04-20 ENCOUNTER — Ambulatory Visit: Payer: Medicare Other | Attending: Family Medicine | Admitting: Occupational Therapy

## 2018-04-20 ENCOUNTER — Ambulatory Visit: Payer: Medicare Other | Admitting: Occupational Therapy

## 2018-04-20 DIAGNOSIS — I890 Lymphedema, not elsewhere classified: Secondary | ICD-10-CM | POA: Insufficient documentation

## 2018-04-20 NOTE — Therapy (Signed)
[place] MAIN [facility] SERVICES [address] [place], [place], [address] Phone: [phone]   Fax:  [phone]  Occupational Therapy Treatment Note and Progress Report  Patient Details  Name: Evelyn Cisneros MRN: [id number] Date of Birth: Sep 25, 1941 Referring Provider: Clarisse Gouge, MD   Encounter Date: 04/20/2018    Past Medical History:  Diagnosis Date  . Arthritis   . Hypertension     Past Surgical History:  Procedure Laterality Date  . ABDOMINAL HYSTERECTOMY    . REPLACEMENT TOTAL KNEE BILATERAL    . SPINE SURGERY      There were no vitals filed for this visit.  Subjective Assessment - 04/20/18 1219    Subjective   Pt arrived   for OT visit #9/36 to address BLE LE, R>L. Pt presents with knee length RLE compression wraps in place. She is accompanied by her spouse. Pt reports, "My leg is kinda sore right here in the front and on the side."    Patient is accompained by:  Family member    Patient Stated Goals  Get the swelling down so I can move better  and so my legs dont hurt as much.    Currently in Pain?  Yes chronic back pain unchanged- not rated numerically today.      Pain Onset  More than a month ago                   OT Treatments/Exercises (OP) - 04/20/18 0001      ADLs   ADL Education Given  Yes      Manual Therapy   Manual Therapy  Edema management;Manual Lymphatic Drainage (MLD);Compression Bandaging    Manual Lymphatic Drainage (MLD)  Performed MLD for LE sequence utilizing short neck, core lymphatics   and functional inguinal LN.     Compression Bandaging  2 layer RLE compression wraps today vs 3 layer as leg is well decongested below the knee.             OT Education - 04/20/18 1220    Education Details  Emphasis of LE self care training today on simple self MLD. Pt and spouse able to practice entire sequenxce at neck using correct techniques with mod A by end of session. They 9observed  remaining deqences throught session. Handout provided.     Person(s) Educated  Patient;Spouse    Methods  Explanation;Demonstration;Tactile cues;Verbal cues;Handout    Comprehension  Verbalized understanding;Returned demonstration;Verbal cues required;Tactile cues required;Need further instruction          OT Long Term Goals - 04/20/18 1228      OT LONG TERM GOAL #1   Title  Pt independent w/ lymphedema precautions/prevention principals and using printed reference to limit LE progression and infection risk.    Baseline  Max I    Time  2    Period  Weeks    Status  Achieved      OT LONG TERM GOAL #2   Title  Lymphedema (LE) management/ self-care: Pt able to apply knee/ thigh length gradient compression wraps to using correct techniques with max assist from spouse  within 2 weeks to achieve optimal limb volume reduction during Intensive Phase of CDT and optimal self-management over time.    Baseline  dependent    Time  2    Period  Weeks    Status  Achieved      OT LONG TERM GOAL #3   Title  Lymphedema (LE) management/  self-care:  Pt to achieve at least 15%  limb volume reduction in  RLE, and 10 % limb volume reduction in LLE during Intensive Phase CDT to limit LE progression, to decrease infection risk and to improve functional performance in all occupational domains.     Baseline  Max A    Time  12    Period  Weeks    Status  Achieved      OT LONG TERM GOAL #4   Title  Lymphedema (LE) management/ self-care:  Pt to tolerate daily compression garments and/ or HOS devices in keeping w/ prescribed wear regime within 1 week of issue date to restore normal limb shape to reduce tissue density and protein build up leading to progression.    Baseline  max A    Time  12    Period  Weeks    Status  On-going      OT LONG TERM GOAL #5   Title  Lymphedema (LE) Pain: Pt to report 25% reduction in discomfort/ pain described as LE  "heaviness", "fullness", tightness"" by DC to facilitate  improved functional mobility,  ambulation, and transfers essential for safety and performance of basic and instrumental ADLs.     Baseline  Max A    Time  12    Period  Weeks    Status  Partially Met      OT LONG TERM GOAL #6   Title  Lymphedema (LE) management/ self-care:  During Management Phase CDT Pt to sustain reduced limb volumes achieved during Intensive Phase CDT at follow up visits during Management Phase CDT within 3% using LE self-care home program components as directed ( simple self MLD, skin care, ther ex and daily/ nightly compression garments/ devices) .    Baseline  Max A    Time  6    Period  Months    Status  On-going            Plan - 04/20/18 1223    Clinical Impression Statement  Combined Pt and spouse edu for simple self MLD wheil teaching and modeling during therapy session. By end of session Pt and spouse were able to perform short neck sequence using correct techniqes             with mod A. Pt and spouse will practice during week long therapy session interval and we'll cont edu next week in prep for transition to Management Phase CDT when she undergoes upcoming back sx. Pt tolerated MLD and compression wrapping without diffculty today. R leg is mildly more swollen today after Pt removed wraps to attend church yesterday. Cont as per OC.    Occupational performance deficits (Please refer to evaluation for details):  ADL's;IADL's;Rest and Sleep;Work;Leisure;Social Participation;Other body imageimpaired by leg swelling, increased falols risk 2/2 body asymmetry    Rehab Potential  Good    OT Frequency  3x / week    OT Duration  12 weeks    OT Treatment/Interventions  Self-care/ADL training;Therapeutic exercise;Manual lymph drainage;Compression bandaging;Patient/family education;Other (comment);Therapeutic activities;DME and/or AE instruction;Manual Therapy BLE skin care to improve skin hydration and decrease infection risk    Clinical Decision Making  Several  treatment options, min-mod task modification necessary    OT Home Exercise Plan  lymphatic pumping ther ex : 1 set of 10 , in order, 2 x daily, BLE,     Recommended Other Services  fit with comfortable compression garments that are effective for controlling leg swelling , comfortable and easy to  don and doff using assistive devices    Consulted and Agree with Plan of Care  Patient;Family member/caregiver       Patient will benefit from skilled therapeutic intervention in order to improve the following deficits and impairments:  Abnormal gait, Decreased knowledge of use of DME, Decreased skin integrity, Increased edema, Impaired flexibility, Pain, Decreased mobility, Impaired sensation, Decreased activity tolerance, Decreased range of motion, Decreased strength, Increased muscle spasms, Decreased balance, Decreased knowledge of precautions, Difficulty walking, Impaired UE functional use  Visit Diagnosis: Lymphedema, not elsewhere classified    Problem List Patient Active Problem List   Diagnosis Date Noted  . Abnormal glucose level 02/12/2018  . Abnormal weight gain 02/12/2018  . Allergic rhinitis 02/12/2018  . Asthma 02/12/2018  . Bradycardia 02/12/2018  . Edema 02/12/2018  . Heart murmur 02/12/2018  . Hypercalcemia 02/12/2018  . Hypokalemia 02/12/2018  . Pure hypercholesterolemia 02/12/2018  . Shoulder joint pain 02/12/2018  . Skin sensation disturbance 02/12/2018  . Syncope 02/12/2018  . Vitamin D deficiency 02/12/2018  . 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 Muscle spasticity 04/24/2017  . Weight loss 12/19/2016  . Hypothyroidism 11/24/2016  . Bilateral leg edema 11/24/2016  . Cervical radiculopathy 11/24/2016  . Disc degeneration, lumbar 11/24/2016  . Essential hypertension 11/24/2016  . Gallstones 11/24/2016  . History of anxiety state 11/24/2016  . Hyperlipidemia, unspecified 11/24/2016  . Spinal stenosis of lumbar region  with neurogenic claudication 11/08/2016  . Anxiety 06/08/2015    Ansel Bong 04/20/2018, 12:30 PM  [place] MAIN [facility] SERVICES [address] [place], [place], [address] Phone: [phone]   Fax:  [phone]  Name: Evelyn Cisneros MRN: [id number] Date of Birth: [birth year]

## 2018-04-22 ENCOUNTER — Encounter: Payer: Medicare Other | Admitting: Occupational Therapy

## 2018-04-24 NOTE — Pre-Procedure Instructions (Signed)
Evelyn ApleyLois Cisneros  04/24/2018      [facility] [phone] - [place] [address] AT [facility] [address] [place] [id number] Phone: [phone] Fax: [phone]    Your procedure is scheduled on   Monday 05/04/18  [facility].M.  Call this number if you have problems the morning of surgery:  [phone]   Remember:  Do not eat or drink after midnight.    Take these medicines the morning of surgery with A SIP OF WATER- GABAPENTIN (NEURONTIN), LEVOTHYROXINE, TRAMADOL IF NEEDED  7 days prior to surgery STOP taking any Aspirin(unless otherwise instructed by your surgeon), Aleve, Naproxen, Ibuprofen, Motrin, Advil, Goody's, BC's, all herbal medications, fish oil, and all vitamins, DICLOFENAC(ARTHROTEC)    Do not wear jewelry, make-up or nail polish.  Do not wear lotions, powders, or perfumes, or deodorant.  Do not shave 48 hours prior to surgery.  Men may shave face and neck.  Do not bring valuables to the hospital.  [facility] is not [facility] for any belongings or valuables.  Contacts, dentures or bridgework may not be worn into surgery.  Leave your suitcase in the car.  After surgery it may be brought to your room.  For patients admitted to the hospital, discharge time will be determined by your treatment team.  Patients discharged the day of surgery will not be allowed to drive home.   Name and phone number of your driver:    Special instructions:  [place] - Preparing for Surgery  Before surgery, you can play an important role.  Because skin is not sterile, your skin needs to be as free of germs as possible.  You can reduce the number of germs on you skin by washing with CHG (chlorahexidine gluconate) soap before surgery.  CHG is an antiseptic cleaner which kills germs and bonds with the skin to continue killing germs even after washing.  Oral Hygiene is also important in reducing the risk  of infection.  Remember to brush your teeth with your regular toothpaste the morning of surgery.  Please DO NOT use if you have an allergy to CHG or antibacterial soaps.  If your skin becomes reddened/irritated stop using the CHG and inform your nurse when you arrive at Short Stay.  Do not shave (including legs and underarms) for at least 48 hours prior to the first CHG shower.  You may shave your face.  Please follow these instructions carefully:   1.  Shower with CHG Soap the night before surgery and the morning of Surgery.  2.  If you choose to wash your hair, wash your hair first as usual with your normal shampoo.  3.  After you shampoo, rinse your hair and body thoroughly to remove the shampoo. 4.  Use CHG as you would any other liquid soap.  You can apply chg directly to the skin and wash gently with a      scrungie or washcloth.           5.  Apply the CHG Soap to your body ONLY FROM THE NECK DOWN.   Do not use on open wounds or open sores. Avoid contact with your eyes, ears, mouth and genitals (private parts).  Wash genitals (private parts) with your normal soap.  6.  Wash thoroughly, paying special attention to the area where your surgery will be performed.  7.  Thoroughly rinse  your body with warm water from the neck down.  8.  DO NOT shower/wash with your normal soap after using and rinsing off the CHG Soap.  9.  Pat yourself dry with a clean towel.            10.  Wear clean pajamas.            11.  Place clean sheets on your bed the night of your first shower and do not sleep with pets.  Day of Surgery  Do not apply any lotions/deoderants the morning of surgery.   Please wear clean clothes to the hospital/surgery center. Remember to brush your teeth with toothpaste.     Please read over the following fact sheets that you were given. Pain Booklet, MRSA Information and Surgical Site Infection Prevention

## 2018-04-27 ENCOUNTER — Other Ambulatory Visit: Payer: Self-pay

## 2018-04-27 ENCOUNTER — Encounter (HOSPITAL_COMMUNITY)
Admission: RE | Admit: 2018-04-27 | Discharge: 2018-04-27 | Disposition: A | Discharge status: Home / Self Care | Payer: Medicare Other | Source: Ambulatory Visit | Attending: Neurosurgery | Admitting: Neurosurgery

## 2018-04-27 ENCOUNTER — Encounter: Payer: Medicare Other | Admitting: Occupational Therapy

## 2018-04-27 ENCOUNTER — Encounter (HOSPITAL_COMMUNITY): Payer: Self-pay

## 2018-04-27 DIAGNOSIS — I498 Other specified cardiac arrhythmias: Secondary | ICD-10-CM | POA: Insufficient documentation

## 2018-04-27 DIAGNOSIS — Z01812 Encounter for preprocedural laboratory examination: Secondary | ICD-10-CM | POA: Diagnosis not present

## 2018-04-27 DIAGNOSIS — Z01818 Encounter for other preprocedural examination: Secondary | ICD-10-CM | POA: Insufficient documentation

## 2018-04-27 DIAGNOSIS — Z0183 Encounter for blood typing: Secondary | ICD-10-CM | POA: Insufficient documentation

## 2018-04-27 HISTORY — DX: Cardiac murmur, unspecified: R01.1

## 2018-04-27 HISTORY — DX: Anemia, unspecified: D64.9

## 2018-04-27 HISTORY — DX: Hypothyroidism, unspecified: E03.9

## 2018-04-27 HISTORY — DX: Spondylolisthesis, lumbar region: M43.16

## 2018-04-27 LAB — TYPE AND SCREEN
ABO/RH(D): A POS
Antibody Screen: NEGATIVE

## 2018-04-27 LAB — BASIC METABOLIC PANEL
Anion gap: 9 (ref 5–15)
BUN: 17 mg/dL (ref 8–23)
CALCIUM: 8.9 mg/dL (ref 8.9–10.3)
CO2: 23 mmol/L (ref 22–32)
CREATININE: 0.61 mg/dL (ref 0.44–1.00)
Chloride: 102 mmol/L (ref 98–111)
GFR calc non Af Amer: >60 mL/min (ref >60)
GLUCOSE: 87 mg/dL (ref 70–99)
Potassium: 4.7 mmol/L (ref 3.5–5.1)
Sodium: 134 mmol/L — ABNORMAL LOW (ref 135–145)

## 2018-04-27 LAB — CBC
HCT: 38.3 % (ref 36.0–46.0)
Hemoglobin: 11.9 g/dL — ABNORMAL LOW (ref 12.0–15.0)
MCH: 30.4 pg (ref 26.0–34.0)
MCHC: 31.1 g/dL (ref 30.0–36.0)
MCV: 98 fL (ref 78.0–100.0)
PLATELETS: 251 10*3/uL (ref 150–400)
RBC: 3.91 MIL/uL (ref 3.87–5.11)
RDW: 13.5 % (ref 11.5–15.5)
WBC: 7.7 10*3/uL (ref 4.0–10.5)

## 2018-04-27 LAB — ABO/RH: ABO/RH(D): A POS

## 2018-04-27 LAB — SURGICAL PCR SCREEN
MRSA, PCR: NEGATIVE
Staphylococcus aureus: NEGATIVE

## 2018-04-27 NOTE — Progress Notes (Signed)
PCP - Dr. Wendie SimmerVickie Fowler  Cardiologist - Denies  Chest x-ray - Denies  EKG - 04/27/18  Stress Test - Denies  ECHO - 11/21/16 (CE)  Cardiac Cath - Denies  Sleep Study - Denies CPAP - None  LABS- 04/27/18: CBC, BMP, T/S  ASA- LD- 7/8   Anesthesia- NO  Pt denies having chest pain, sob, or fever at this time. All instructions explained to the pt, with a verbal understanding of the material. Pt agrees to go over the instructions while at home for a better understanding. The opportunity to ask questions was provided.

## 2018-04-29 ENCOUNTER — Ambulatory Visit: Payer: Medicare Other | Admitting: Occupational Therapy

## 2018-04-29 DIAGNOSIS — I890 Lymphedema, not elsewhere classified: Secondary | ICD-10-CM | POA: Diagnosis not present

## 2018-04-29 NOTE — Therapy (Signed)
[place] MAIN [facility] SERVICES [address] [place], [place], [address] Phone: [phone]   Fax:  [phone]  Occupational Therapy Treatment  Patient Details  Name: Evelyn Cisneros MRN: [id number] Date of Birth: 1941/06/26 Referring Provider: Clarisse Gouge, MD   Encounter Date: 04/29/2018  OT End of Session - 04/29/18 1343    Visit Number  10    Number of Visits  36    Date for OT Re-Evaluation  06/28/18    OT Start Time  1110    OT Stop Time  1207    OT Time Calculation (min)  57 min    Activity Tolerance  Patient tolerated treatment well;No increased pain;Patient limited by pain    Behavior During Therapy  [facility] for tasks assessed/performed       Past Medical History:  Diagnosis Date  . Anemia   . Arthritis   . Heart murmur   . Hypertension   . Hypothyroidism   . Spondylolisthesis of lumbar region     Past Surgical History:  Procedure Laterality Date  . ABDOMINAL HYSTERECTOMY    . REPLACEMENT TOTAL KNEE BILATERAL    . SPINE SURGERY      There were no vitals filed for this visit.                        OT Education - 04/29/18 1340    Education Details  Pt and family education for practical ways to observe back precautions ( no bending, lifting, twisting) after upcoming sx. Practiced supine to sit  and STS transfer from bed to transport wc. Pt/ spouse edu for transfers using risers under furniture and with raised toilet seat. PDiscussed assistive devices for car/ Lucianne Lei transfers. Skilled training for home mods  in advance to reduce falls risk. Good return, despite resistance to removing throw rugs.  By end of session Pt agreed to remove them temporarily and to wear snon-slip slippers in the home.    Person(s) Educated  Patient;Spouse    Methods  Explanation;Demonstration;Tactile cues;Verbal cues;Handout    Comprehension  Verbalized understanding;Returned demonstration;Verbal cues required;Tactile cues  required;Need further instruction          OT Long Term Goals - 04/29/18 1344      OT LONG TERM GOAL #1   Title  Pt independent w/ lymphedema precautions/prevention principals and using printed reference to limit LE progression and infection risk.    Baseline  Max I    Time  2    Period  Weeks    Status  Achieved      OT LONG TERM GOAL #2   Title  Lymphedema (LE) management/ self-care: Pt able to apply knee/ thigh length gradient compression wraps to using correct techniques with max assist from spouse  within 2 weeks to achieve optimal limb volume reduction during Intensive Phase of CDT and optimal self-management over time.    Baseline  dependent    Time  2    Period  Weeks    Status  Achieved      OT LONG TERM GOAL #3   Title  Lymphedema (LE) management/ self-care:  Pt to achieve at least 15%  limb volume reduction in  RLE, and 10 % limb volume reduction in LLE during Intensive Phase CDT to limit LE progression, to decrease infection risk and to improve functional performance in all occupational domains.     Baseline  Max A    Time  12  Period  Weeks    Status  Achieved      OT LONG TERM GOAL #4   Title  Lymphedema (LE) management/ self-care:  Pt to tolerate daily compression garments and/ or HOS devices in keeping w/ prescribed wear regime within 1 week of issue date to restore normal limb shape to reduce tissue density and protein build up leading to progression.    Baseline  max A    Time  12    Period  Weeks    Status  On-going      OT LONG TERM GOAL #5   Title  Lymphedema (LE) Pain: Pt to report 25% reduction in discomfort/ pain described as LE  "heaviness", "fullness", tightness"" by DC to facilitate improved functional mobility,  ambulation, and transfers essential for safety and performance of basic and instrumental ADLs.     Baseline  Max A    Time  12    Period  Weeks    Status  Achieved      OT LONG TERM GOAL #6   Title  Lymphedema (LE) management/  self-care:  During Management Phase CDT Pt to sustain reduced limb volumes achieved during Intensive Phase CDT at follow up visits during Management Phase CDT within 3% using LE self-care home program components as directed ( simple self MLD, skin care, ther ex and daily/ nightly compression garments/ devices) .    Baseline  Max A    Time  6    Period  Months    Status  On-going            Plan - 04/29/18 1345    Clinical Impression Statement  Pt reports no pain in her legs today for the first timer since commencing OT for CLT. Swelling is very well managed since last seen several days ago. Spouse is a devoted Environmental consultant. Pt expressed concern about recovery phase of upcoming back sx. OT provided Pt and spouse throughout manual therapy for homesafety and transfer techniques in keeping w/ back precautions. Pt has met most OT long term LER goals, except garment goal. Custom RLE knee high compression stocking has been ordered and is pending delivery and fitting.  The goal for sustaining clinical gains with limb volumes managed within 3% is ongoing. Cont as per POC. Pt will need medical release to resume OT after sx and home care .    Occupational performance deficits (Please refer to evaluation for details):  ADL's;IADL's;Rest and Sleep;Work;Leisure;Social Participation;Other body imageimpaired by leg swelling, increased falols risk 2/2 body asymmetry    Rehab Potential  Good    OT Frequency  3x / week    OT Duration  12 weeks    OT Treatment/Interventions  Self-care/ADL training;Therapeutic exercise;Manual lymph drainage;Compression bandaging;Patient/family education;Other (comment);Therapeutic activities;DME and/or AE instruction;Manual Therapy BLE skin care to improve skin hydration and decrease infection risk    Clinical Decision Making  Several treatment options, min-mod task modification necessary    OT Home Exercise Plan  lymphatic pumping ther ex : 1 set of 10 , in order, 2 x daily, BLE,      Recommended Other Services  fit with comfortable compression garments that are effective for controlling leg swelling , comfortable and easy to don and doff using assistive devices    Consulted and Agree with Plan of Care  Patient;Family member/caregiver       Patient will benefit from skilled therapeutic intervention in order to improve the following deficits and impairments:  Abnormal gait, Decreased knowledge of use  of DME, Decreased skin integrity, Increased edema, Impaired flexibility, Pain, Decreased mobility, Impaired sensation, Decreased activity tolerance, Decreased range of motion, Decreased strength, Increased muscle spasms, Decreased balance, Decreased knowledge of precautions, Difficulty walking, Impaired UE functional use  Visit Diagnosis: Lymphedema, not elsewhere classified    Problem List Patient Active Problem List   Diagnosis Date Noted  . Abnormal glucose level 02/12/2018  . Abnormal weight gain 02/12/2018  . Allergic rhinitis 02/12/2018  . Asthma 02/12/2018  . Bradycardia 02/12/2018  . Edema 02/12/2018  . Heart murmur 02/12/2018  . Hypercalcemia 02/12/2018  . Hypokalemia 02/12/2018  . Pure hypercholesterolemia 02/12/2018  . Shoulder joint pain 02/12/2018  . Skin sensation disturbance 02/12/2018  . Syncope 02/12/2018  . Vitamin D deficiency 02/12/2018  . 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 Muscle spasticity 04/24/2017  . Weight loss 12/19/2016  . Hypothyroidism 11/24/2016  . Bilateral leg edema 11/24/2016  . Cervical radiculopathy 11/24/2016  . Disc degeneration, lumbar 11/24/2016  . Essential hypertension 11/24/2016  . Gallstones 11/24/2016  . History of anxiety state 11/24/2016  . Hyperlipidemia, unspecified 11/24/2016  . Spinal stenosis of lumbar region with neurogenic claudication 11/08/2016  . Anxiety 06/08/2015    Andrey Spearman, MS, OTR/L, CLT-LANA 04/29/18 1:50 PM   [place] MAIN [facility] SERVICES [address] [place], [place], [address] Phone: [phone]   Fax:  [phone]  Name: Evelyn Cisneros MRN: [id number] Date of Birth: [birth year]

## 2018-04-29 NOTE — Therapy (Signed)
[place] MAIN [facility] SERVICES [address] [place], [place], [address] Phone: [phone]   Fax:  [phone]  Occupational Therapy Treatment  Note and Progress Report  Patient Details  Name: Evelyn Cisneros MRN: [id number] Date of Birth: 1940/12/29 Referring Provider: Clarisse Gouge, MD   Encounter Date: 04/29/2018  OT End of Session - 04/29/18 1343    Visit Number  10    Number of Visits  36    Date for OT Re-Evaluation  06/28/18    OT Start Time  1110    OT Stop Time  1207    OT Time Calculation (min)  57 min    Activity Tolerance  Patient tolerated treatment well;No increased pain;Patient limited by pain    Behavior During Therapy  [facility] for tasks assessed/performed       Past Medical History:  Diagnosis Date  . Anemia   . Arthritis   . Heart murmur   . Hypertension   . Hypothyroidism   . Spondylolisthesis of lumbar region     Past Surgical History:  Procedure Laterality Date  . ABDOMINAL HYSTERECTOMY    . REPLACEMENT TOTAL KNEE BILATERAL    . SPINE SURGERY      There were no vitals filed for this visit.                        OT Education - 04/29/18 1340    Education Details  Pt and family education for practical ways to observe back precautions ( no bending, lifting, twisting) after upcoming sx. Practiced supine to sit  and STS transfer from bed to transport wc. Pt/ spouse edu for transfers using risers under furniture and with raised toilet seat. PDiscussed assistive devices for car/ Lucianne Lei transfers. Skilled training for home mods  in advance to reduce falls risk. Good return, despite resistance to removing throw rugs.  By end of session Pt agreed to remove them temporarily and to wear snon-slip slippers in the home.    Person(s) Educated  Patient;Spouse    Methods  Explanation;Demonstration;Tactile cues;Verbal cues;Handout    Comprehension  Verbalized understanding;Returned demonstration;Verbal cues  required;Tactile cues required;Need further instruction          OT Long Term Goals - 04/29/18 1344      OT LONG TERM GOAL #1   Title  Pt independent w/ lymphedema precautions/prevention principals and using printed reference to limit LE progression and infection risk.    Baseline  Max I    Time  2    Period  Weeks    Status  Achieved      OT LONG TERM GOAL #2   Title  Lymphedema (LE) management/ self-care: Pt able to apply knee/ thigh length gradient compression wraps to using correct techniques with max assist from spouse  within 2 weeks to achieve optimal limb volume reduction during Intensive Phase of CDT and optimal self-management over time.    Baseline  dependent    Time  2    Period  Weeks    Status  Achieved      OT LONG TERM GOAL #3   Title  Lymphedema (LE) management/ self-care:  Pt to achieve at least 15%  limb volume reduction in  RLE, and 10 % limb volume reduction in LLE during Intensive Phase CDT to limit LE progression, to decrease infection risk and to improve functional performance in all occupational domains.     Baseline  Max A  Time  12    Period  Weeks    Status  Achieved      OT LONG TERM GOAL #4   Title  Lymphedema (LE) management/ self-care:  Pt to tolerate daily compression garments and/ or HOS devices in keeping w/ prescribed wear regime within 1 week of issue date to restore normal limb shape to reduce tissue density and protein build up leading to progression.    Baseline  max A    Time  12    Period  Weeks    Status  On-going      OT LONG TERM GOAL #5   Title  Lymphedema (LE) Pain: Pt to report 25% reduction in discomfort/ pain described as LE  "heaviness", "fullness", tightness"" by DC to facilitate improved functional mobility,  ambulation, and transfers essential for safety and performance of basic and instrumental ADLs.     Baseline  Max A    Time  12    Period  Weeks    Status  Achieved      OT LONG TERM GOAL #6   Title  Lymphedema  (LE) management/ self-care:  During Management Phase CDT Pt to sustain reduced limb volumes achieved during Intensive Phase CDT at follow up visits during Management Phase CDT within 3% using LE self-care home program components as directed ( simple self MLD, skin care, ther ex and daily/ nightly compression garments/ devices) .    Baseline  Max A    Time  6    Period  Months    Status  On-going            Plan - 04/29/18 1345    Clinical Impression Statement  Pt reports no pain in her legs today for the first timer since commencing OT for CLT. Swelling is very well managed since last seen several days ago. Spouse is a devoted Environmental consultant. Pt expressed concern about recovery phase of upcoming back sx. OT provided Pt and spouse throughout manual therapy for homesafety and transfer techniques in keeping w/ back precautions. Pt has met most OT long term LER goals, except garment goal. Custom RLE knee high compression stocking has been ordered and is pending delivery and fitting.  The goal for sustaining clinical gains with limb volumes managed within 3% is ongoing. Cont as per POC. Pt will need medical release to resume OT after sx and home care .    Occupational performance deficits (Please refer to evaluation for details):  ADL's;IADL's;Rest and Sleep;Work;Leisure;Social Participation;Other body imageimpaired by leg swelling, increased falols risk 2/2 body asymmetry    Rehab Potential  Good    OT Frequency  3x / week    OT Duration  12 weeks    OT Treatment/Interventions  Self-care/ADL training;Therapeutic exercise;Manual lymph drainage;Compression bandaging;Patient/family education;Other (comment);Therapeutic activities;DME and/or AE instruction;Manual Therapy BLE skin care to improve skin hydration and decrease infection risk    Clinical Decision Making  Several treatment options, min-mod task modification necessary    OT Home Exercise Plan  lymphatic pumping ther ex : 1 set of 10 , in order, 2 x  daily, BLE,     Recommended Other Services  fit with comfortable compression garments that are effective for controlling leg swelling , comfortable and easy to don and doff using assistive devices    Consulted and Agree with Plan of Care  Patient;Family member/caregiver       Patient will benefit from skilled therapeutic intervention in order to improve the following deficits and impairments:  Abnormal gait, Decreased knowledge of use of DME, Decreased skin integrity, Increased edema, Impaired flexibility, Pain, Decreased mobility, Impaired sensation, Decreased activity tolerance, Decreased range of motion, Decreased strength, Increased muscle spasms, Decreased balance, Decreased knowledge of precautions, Difficulty walking, Impaired UE functional use  Visit Diagnosis: Lymphedema, not elsewhere classified    Problem List Patient Active Problem List   Diagnosis Date Noted  . Abnormal glucose level 02/12/2018  . Abnormal weight gain 02/12/2018  . Allergic rhinitis 02/12/2018  . Asthma 02/12/2018  . Bradycardia 02/12/2018  . Edema 02/12/2018  . Heart murmur 02/12/2018  . Hypercalcemia 02/12/2018  . Hypokalemia 02/12/2018  . Pure hypercholesterolemia 02/12/2018  . Shoulder joint pain 02/12/2018  . Skin sensation disturbance 02/12/2018  . Syncope 02/12/2018  . Vitamin D deficiency 02/12/2018  . 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 Muscle spasticity 04/24/2017  . Weight loss 12/19/2016  . Hypothyroidism 11/24/2016  . Bilateral leg edema 11/24/2016  . Cervical radiculopathy 11/24/2016  . Disc degeneration, lumbar 11/24/2016  . Essential hypertension 11/24/2016  . Gallstones 11/24/2016  . History of anxiety state 11/24/2016  . Hyperlipidemia, unspecified 11/24/2016  . Spinal stenosis of lumbar region with neurogenic claudication 11/08/2016  . Anxiety 06/08/2015    Ansel Bong 04/29/2018, 2:03 PM  [place] MAIN [facility] SERVICES [address] [place], [place], [address] Phone: [phone]   Fax:  [phone]  Name: Evelyn Cisneros MRN: [id number] Date of Birth: 25-May-1941

## 2018-04-30 ENCOUNTER — Ambulatory Visit: Payer: Medicare Other | Admitting: Occupational Therapy

## 2018-05-04 ENCOUNTER — Inpatient Hospital Stay (HOSPITAL_COMMUNITY): Payer: Medicare Other

## 2018-05-04 ENCOUNTER — Encounter (HOSPITAL_COMMUNITY): Payer: Self-pay | Admitting: *Deleted

## 2018-05-04 ENCOUNTER — Inpatient Hospital Stay (HOSPITAL_COMMUNITY): Payer: Medicare Other | Admitting: Anesthesiology

## 2018-05-04 ENCOUNTER — Inpatient Hospital Stay (HOSPITAL_COMMUNITY)
Admission: RE | Disposition: A | Discharge status: Home Health | Payer: Self-pay | Source: Home / Self Care | Attending: Neurosurgery

## 2018-05-04 ENCOUNTER — Other Ambulatory Visit: Payer: Self-pay

## 2018-05-04 ENCOUNTER — Inpatient Hospital Stay (HOSPITAL_COMMUNITY)
Admission: RE | Admit: 2018-05-04 | Discharge: 2018-05-05 | DRG: 455 | Disposition: A | Discharge status: Home Health | Payer: Medicare Other | Attending: Neurosurgery | Admitting: Neurosurgery

## 2018-05-04 DIAGNOSIS — Z981 Arthrodesis status: Secondary | ICD-10-CM | POA: Diagnosis not present

## 2018-05-04 DIAGNOSIS — Z79891 Long term (current) use of opiate analgesic: Secondary | ICD-10-CM | POA: Diagnosis not present

## 2018-05-04 DIAGNOSIS — I10 Essential (primary) hypertension: Secondary | ICD-10-CM | POA: Diagnosis present

## 2018-05-04 DIAGNOSIS — M4316 Spondylolisthesis, lumbar region: Secondary | ICD-10-CM | POA: Diagnosis present

## 2018-05-04 DIAGNOSIS — Z96653 Presence of artificial knee joint, bilateral: Secondary | ICD-10-CM | POA: Diagnosis present

## 2018-05-04 DIAGNOSIS — Z888 Allergy status to other drugs, medicaments and biological substances status: Secondary | ICD-10-CM | POA: Diagnosis not present

## 2018-05-04 DIAGNOSIS — Z886 Allergy status to analgesic agent status: Secondary | ICD-10-CM | POA: Diagnosis not present

## 2018-05-04 DIAGNOSIS — Z7982 Long term (current) use of aspirin: Secondary | ICD-10-CM | POA: Diagnosis not present

## 2018-05-04 DIAGNOSIS — M48062 Spinal stenosis, lumbar region with neurogenic claudication: Secondary | ICD-10-CM | POA: Diagnosis present

## 2018-05-04 DIAGNOSIS — E039 Hypothyroidism, unspecified: Secondary | ICD-10-CM | POA: Diagnosis present

## 2018-05-04 DIAGNOSIS — M5116 Intervertebral disc disorders with radiculopathy, lumbar region: Secondary | ICD-10-CM | POA: Diagnosis present

## 2018-05-04 DIAGNOSIS — Z91018 Allergy to other foods: Secondary | ICD-10-CM | POA: Diagnosis not present

## 2018-05-04 DIAGNOSIS — Z79890 Hormone replacement therapy (postmenopausal): Secondary | ICD-10-CM

## 2018-05-04 DIAGNOSIS — Z90710 Acquired absence of both cervix and uterus: Secondary | ICD-10-CM | POA: Diagnosis not present

## 2018-05-04 DIAGNOSIS — Z419 Encounter for procedure for purposes other than remedying health state, unspecified: Secondary | ICD-10-CM

## 2018-05-04 DIAGNOSIS — M4696 Unspecified inflammatory spondylopathy, lumbar region: Secondary | ICD-10-CM | POA: Diagnosis present

## 2018-05-04 SURGERY — POSTERIOR LUMBAR FUSION 1 LEVEL
Anesthesia: General | Site: Spine Lumbar

## 2018-05-04 MED ORDER — ACETAMINOPHEN 325 MG PO TABS: 650.0000 mg | ORAL_TABLET | ORAL | Status: DC | PRN

## 2018-05-04 MED ORDER — PROPOFOL 10 MG/ML IV BOLUS
INTRAVENOUS | Status: AC
  Filled 2018-05-04: qty 20

## 2018-05-04 MED ORDER — HEMOSTATIC AGENTS (NO CHARGE) OPTIME
TOPICAL | Status: DC | PRN
  Administered 2018-05-04: 1 via TOPICAL

## 2018-05-04 MED ORDER — SODIUM CHLORIDE 0.9 % IV SOLN
INTRAVENOUS | Status: DC | PRN
  Administered 2018-05-04: 500 mL

## 2018-05-04 MED ORDER — VITAMIN D 1000 UNITS PO TABS
5000.0000 [IU] | ORAL_TABLET | Freq: Every day | ORAL | Status: DC
  Administered 2018-05-05: 5000 [IU] via ORAL
  Filled 2018-05-04: qty 5

## 2018-05-04 MED ORDER — PAROXETINE HCL 10 MG PO TABS
10.0000 mg | ORAL_TABLET | Freq: Every evening | ORAL | Status: DC
  Administered 2018-05-04: 10 mg via ORAL
  Filled 2018-05-04 (×2): qty 1

## 2018-05-04 MED ORDER — ROCURONIUM BROMIDE 10 MG/ML (PF) SYRINGE
PREFILLED_SYRINGE | INTRAVENOUS | Status: DC | PRN
  Administered 2018-05-04 (×2): 20 mg via INTRAVENOUS
  Administered 2018-05-04: 50 mg via INTRAVENOUS

## 2018-05-04 MED ORDER — VANCOMYCIN HCL 1000 MG IV SOLR
INTRAVENOUS | Status: AC
  Filled 2018-05-04: qty 1000

## 2018-05-04 MED ORDER — VANCOMYCIN HCL 1 G IV SOLR
INTRAVENOUS | Status: DC | PRN
  Administered 2018-05-04: 1000 mg via TOPICAL

## 2018-05-04 MED ORDER — EPHEDRINE SULFATE 50 MG/ML IJ SOLN
INTRAMUSCULAR | Status: DC | PRN
  Administered 2018-05-04 (×2): 10 mg via INTRAVENOUS
  Administered 2018-05-04: 5 mg via INTRAVENOUS

## 2018-05-04 MED ORDER — CYCLOBENZAPRINE HCL 10 MG PO TABS
ORAL_TABLET | ORAL | Status: AC
  Administered 2018-05-04: 10 mg via ORAL
  Filled 2018-05-04: qty 1

## 2018-05-04 MED ORDER — CHLORHEXIDINE GLUCONATE CLOTH 2 % EX PADS: 6.0000 | MEDICATED_PAD | Freq: Once | CUTANEOUS | Status: DC

## 2018-05-04 MED ORDER — BUPIVACAINE-EPINEPHRINE (PF) 0.5% -1:200000 IJ SOLN
INTRAMUSCULAR | Status: AC
  Filled 2018-05-04: qty 30

## 2018-05-04 MED ORDER — CEFAZOLIN SODIUM-DEXTROSE 2-4 GM/100ML-% IV SOLN
INTRAVENOUS | Status: AC
  Filled 2018-05-04: qty 100

## 2018-05-04 MED ORDER — GABAPENTIN 300 MG PO CAPS
300.0000 mg | ORAL_CAPSULE | Freq: Three times a day (TID) | ORAL | Status: DC
  Administered 2018-05-04 – 2018-05-05 (×3): 300 mg via ORAL
  Filled 2018-05-04 (×3): qty 1

## 2018-05-04 MED ORDER — ONDANSETRON HCL 4 MG PO TABS: 4.0000 mg | ORAL_TABLET | Freq: Four times a day (QID) | ORAL | Status: DC | PRN

## 2018-05-04 MED ORDER — FERROUS SULFATE 325 (65 FE) MG PO TABS
325.0000 mg | ORAL_TABLET | ORAL | Status: DC
  Administered 2018-05-05: 325 mg via ORAL
  Filled 2018-05-04: qty 1

## 2018-05-04 MED ORDER — PHENOL 1.4 % MT LIQD: 1.0000 | OROMUCOSAL | Status: DC | PRN

## 2018-05-04 MED ORDER — OXYCODONE HCL 5 MG PO TABS
ORAL_TABLET | ORAL | Status: AC
  Administered 2018-05-04: 5 mg via ORAL
  Filled 2018-05-04: qty 1

## 2018-05-04 MED ORDER — HYDROMORPHONE HCL 1 MG/ML IJ SOLN
0.2500 mg | INTRAMUSCULAR | Status: DC | PRN
  Administered 2018-05-04: 0.5 mg via INTRAVENOUS
  Administered 2018-05-04 (×2): 0.25 mg via INTRAVENOUS

## 2018-05-04 MED ORDER — FENTANYL CITRATE (PF) 250 MCG/5ML IJ SOLN
INTRAMUSCULAR | Status: DC | PRN
  Administered 2018-05-04: 50 ug via INTRAVENOUS
  Administered 2018-05-04: 100 ug via INTRAVENOUS

## 2018-05-04 MED ORDER — DEXAMETHASONE SODIUM PHOSPHATE 10 MG/ML IJ SOLN
INTRAMUSCULAR | Status: DC | PRN
  Administered 2018-05-04: 10 mg via INTRAVENOUS

## 2018-05-04 MED ORDER — PHENYLEPHRINE 40 MCG/ML (10ML) SYRINGE FOR IV PUSH (FOR BLOOD PRESSURE SUPPORT)
PREFILLED_SYRINGE | INTRAVENOUS | Status: DC | PRN
  Administered 2018-05-04: 40 ug via INTRAVENOUS
  Administered 2018-05-04: 80 ug via INTRAVENOUS
  Administered 2018-05-04: 40 ug via INTRAVENOUS

## 2018-05-04 MED ORDER — DOCUSATE SODIUM 100 MG PO CAPS
100.0000 mg | ORAL_CAPSULE | Freq: Two times a day (BID) | ORAL | Status: DC
  Filled 2018-05-04 (×2): qty 1

## 2018-05-04 MED ORDER — CYCLOBENZAPRINE HCL 10 MG PO TABS
10.0000 mg | ORAL_TABLET | Freq: Three times a day (TID) | ORAL | Status: DC | PRN
  Administered 2018-05-04 – 2018-05-05 (×2): 10 mg via ORAL
  Filled 2018-05-04 (×2): qty 1

## 2018-05-04 MED ORDER — BUPIVACAINE-EPINEPHRINE (PF) 0.5% -1:200000 IJ SOLN
INTRAMUSCULAR | Status: DC | PRN
  Administered 2018-05-04: 10 mL

## 2018-05-04 MED ORDER — HYDROCODONE-ACETAMINOPHEN 7.5-325 MG PO TABS: 1.0000 | ORAL_TABLET | Freq: Once | ORAL | Status: DC | PRN

## 2018-05-04 MED ORDER — SODIUM CHLORIDE 0.9% FLUSH: 3.0000 mL | INTRAVENOUS | Status: DC | PRN

## 2018-05-04 MED ORDER — 0.9 % SODIUM CHLORIDE (POUR BTL) OPTIME
TOPICAL | Status: DC | PRN
  Administered 2018-05-04: 1000 mL

## 2018-05-04 MED ORDER — CEFAZOLIN SODIUM-DEXTROSE 2-4 GM/100ML-% IV SOLN
2.0000 g | Freq: Three times a day (TID) | INTRAVENOUS | Status: AC
  Administered 2018-05-04 – 2018-05-05 (×2): 2 g via INTRAVENOUS
  Filled 2018-05-04 (×2): qty 100

## 2018-05-04 MED ORDER — ACETAMINOPHEN 650 MG RE SUPP: 650.0000 mg | RECTAL | Status: DC | PRN

## 2018-05-04 MED ORDER — TRAMADOL HCL 50 MG PO TABS: 50.0000 mg | ORAL_TABLET | Freq: Four times a day (QID) | ORAL | Status: DC | PRN

## 2018-05-04 MED ORDER — LACTATED RINGERS IV SOLN
INTRAVENOUS | Status: DC
  Administered 2018-05-04: 10:00:00 via INTRAVENOUS

## 2018-05-04 MED ORDER — BACITRACIN ZINC 500 UNIT/GM EX OINT
TOPICAL_OINTMENT | CUTANEOUS | Status: DC | PRN
  Administered 2018-05-04: 1 via TOPICAL

## 2018-05-04 MED ORDER — COQ10 200 MG PO CAPS: 200.0000 mg | ORAL_CAPSULE | Freq: Every day | ORAL | Status: DC

## 2018-05-04 MED ORDER — HYDROMORPHONE HCL 1 MG/ML IJ SOLN
INTRAMUSCULAR | Status: AC
  Administered 2018-05-04: 0.25 mg via INTRAVENOUS
  Filled 2018-05-04: qty 1

## 2018-05-04 MED ORDER — ACETAMINOPHEN 500 MG PO TABS
1000.0000 mg | ORAL_TABLET | Freq: Four times a day (QID) | ORAL | Status: DC
  Administered 2018-05-04 – 2018-05-05 (×3): 1000 mg via ORAL
  Filled 2018-05-04 (×3): qty 2

## 2018-05-04 MED ORDER — LEVOTHYROXINE SODIUM 75 MCG PO TABS
75.0000 ug | ORAL_TABLET | Freq: Every day | ORAL | Status: DC
  Administered 2018-05-05: 75 ug via ORAL
  Filled 2018-05-04: qty 1

## 2018-05-04 MED ORDER — ONDANSETRON HCL 4 MG/2ML IJ SOLN
INTRAMUSCULAR | Status: DC | PRN
Start: 2018-05-04 — End: 2018-05-04
  Administered 2018-05-04: 4 mg via INTRAVENOUS

## 2018-05-04 MED ORDER — BACITRACIN ZINC 500 UNIT/GM EX OINT
TOPICAL_OINTMENT | CUTANEOUS | Status: AC
  Filled 2018-05-04: qty 28.35

## 2018-05-04 MED ORDER — BISACODYL 10 MG RE SUPP: 10.0000 mg | Freq: Every day | RECTAL | Status: DC | PRN

## 2018-05-04 MED ORDER — KETAMINE HCL 50 MG/5ML IJ SOSY
PREFILLED_SYRINGE | INTRAMUSCULAR | Status: AC
  Filled 2018-05-04: qty 5

## 2018-05-04 MED ORDER — OXYCODONE HCL 5 MG PO TABS
5.0000 mg | ORAL_TABLET | ORAL | Status: DC | PRN
  Administered 2018-05-04 – 2018-05-05 (×2): 5 mg via ORAL
  Filled 2018-05-04: qty 1

## 2018-05-04 MED ORDER — MEPERIDINE HCL 50 MG/ML IJ SOLN: 6.2500 mg | INTRAMUSCULAR | Status: DC | PRN

## 2018-05-04 MED ORDER — FENTANYL CITRATE (PF) 250 MCG/5ML IJ SOLN
INTRAMUSCULAR | Status: AC
  Filled 2018-05-04: qty 5

## 2018-05-04 MED ORDER — CEFAZOLIN SODIUM-DEXTROSE 2-4 GM/100ML-% IV SOLN
2.0000 g | INTRAVENOUS | Status: AC
  Administered 2018-05-04: 2 g via INTRAVENOUS

## 2018-05-04 MED ORDER — ATORVASTATIN CALCIUM 20 MG PO TABS
20.0000 mg | ORAL_TABLET | Freq: Every evening | ORAL | Status: DC
  Administered 2018-05-04: 20 mg via ORAL
  Filled 2018-05-04: qty 1

## 2018-05-04 MED ORDER — SUGAMMADEX SODIUM 200 MG/2ML IV SOLN
INTRAVENOUS | Status: DC | PRN
  Administered 2018-05-04: 200 mg via INTRAVENOUS

## 2018-05-04 MED ORDER — ONDANSETRON HCL 4 MG/2ML IJ SOLN: 4.0000 mg | Freq: Four times a day (QID) | INTRAMUSCULAR | Status: DC | PRN

## 2018-05-04 MED ORDER — PROPOFOL 10 MG/ML IV BOLUS
INTRAVENOUS | Status: DC | PRN
  Administered 2018-05-04: 150 mg via INTRAVENOUS

## 2018-05-04 MED ORDER — ONDANSETRON HCL 4 MG/2ML IJ SOLN: 4.0000 mg | Freq: Once | INTRAMUSCULAR | Status: DC | PRN

## 2018-05-04 MED ORDER — BUPIVACAINE LIPOSOME 1.3 % IJ SUSP
20.0000 mL | INTRAMUSCULAR | Status: AC
  Administered 2018-05-04: 20 mL
  Filled 2018-05-04: qty 20

## 2018-05-04 MED ORDER — MENTHOL 3 MG MT LOZG: 1.0000 | LOZENGE | OROMUCOSAL | Status: DC | PRN

## 2018-05-04 MED ORDER — ZOLPIDEM TARTRATE 5 MG PO TABS: 5.0000 mg | ORAL_TABLET | Freq: Every evening | ORAL | Status: DC | PRN

## 2018-05-04 MED ORDER — SODIUM CHLORIDE 0.9% FLUSH
3.0000 mL | Freq: Two times a day (BID) | INTRAVENOUS | Status: DC
  Administered 2018-05-04: 3 mL via INTRAVENOUS

## 2018-05-04 MED ORDER — OXYCODONE HCL 5 MG PO TABS
10.0000 mg | ORAL_TABLET | ORAL | Status: DC | PRN
  Administered 2018-05-05 (×2): 10 mg via ORAL
  Filled 2018-05-04 (×2): qty 2

## 2018-05-04 MED ORDER — MORPHINE SULFATE (PF) 4 MG/ML IV SOLN
4.0000 mg | INTRAVENOUS | Status: DC | PRN
  Administered 2018-05-04: 4 mg via INTRAVENOUS
  Filled 2018-05-04: qty 1

## 2018-05-04 MED ORDER — KETAMINE HCL 10 MG/ML IJ SOLN
INTRAMUSCULAR | Status: DC | PRN
  Administered 2018-05-04 (×2): 20 mg via INTRAVENOUS
  Administered 2018-05-04: 10 mg via INTRAVENOUS

## 2018-05-04 MED ORDER — LIDOCAINE 2% (20 MG/ML) 5 ML SYRINGE
INTRAMUSCULAR | Status: DC | PRN
  Administered 2018-05-04 (×2): 50 mg via INTRAVENOUS

## 2018-05-04 MED ORDER — FUROSEMIDE 20 MG PO TABS
20.0000 mg | ORAL_TABLET | Freq: Every day | ORAL | Status: DC
  Filled 2018-05-04: qty 1

## 2018-05-04 MED ORDER — ACETAMINOPHEN 10 MG/ML IV SOLN: 1000.0000 mg | Freq: Once | INTRAVENOUS | Status: DC | PRN

## 2018-05-04 MED ORDER — OXYCODONE-ACETAMINOPHEN 5-325 MG PO TABS: 1.0000 | ORAL_TABLET | ORAL | Status: DC | PRN

## 2018-05-04 MED ORDER — LOSARTAN POTASSIUM 50 MG PO TABS
100.0000 mg | ORAL_TABLET | Freq: Every evening | ORAL | Status: DC
  Administered 2018-05-04: 100 mg via ORAL
  Filled 2018-05-04: qty 2

## 2018-05-04 SURGICAL SUPPLY — 73 items
BAG DECANTER FOR FLEXI CONT (MISCELLANEOUS) ×3 IMPLANT
BENZOIN TINCTURE PRP APPL 2/3 (GAUZE/BANDAGES/DRESSINGS) ×3 IMPLANT
BLADE CLIPPER SURG (BLADE) IMPLANT
BNDG STRETCH 4X75 NS LF (GAUZE/BANDAGES/DRESSINGS) ×3 IMPLANT
BUR MATCHSTICK NEURO 3.0 LAGG (BURR) ×3 IMPLANT
BUR PRECISION FLUTE 6.0 (BURR) ×3 IMPLANT
CANISTER SUCT 3000ML PPV (MISCELLANEOUS) ×3 IMPLANT
CAP REVERE LOCKING (Cap) ×12 IMPLANT
CARTRIDGE OIL MAESTRO DRILL (MISCELLANEOUS) ×1 IMPLANT
CLOSURE WOUND 1/2 X4 (GAUZE/BANDAGES/DRESSINGS) ×1
CONT SPEC 4OZ CLIKSEAL STRL BL (MISCELLANEOUS) ×3 IMPLANT
COVER BACK TABLE 60X90IN (DRAPES) ×3 IMPLANT
DECANTER SPIKE VIAL GLASS SM (MISCELLANEOUS) ×3 IMPLANT
DIFFUSER DRILL AIR PNEUMATIC (MISCELLANEOUS) ×3 IMPLANT
DRAPE C-ARM 42X72 X-RAY (DRAPES) ×6 IMPLANT
DRAPE HALF SHEET 40X57 (DRAPES) ×3 IMPLANT
DRAPE LAPAROTOMY 100X72X124 (DRAPES) ×3 IMPLANT
DRAPE SHEET LG 3/4 BI-LAMINATE (DRAPES) ×3 IMPLANT
DRAPE SURG 17X23 STRL (DRAPES) ×12 IMPLANT
DRSG OPSITE POSTOP 4X6 (GAUZE/BANDAGES/DRESSINGS) ×3 IMPLANT
DRSG TELFA 3X8 NADH (GAUZE/BANDAGES/DRESSINGS) ×3 IMPLANT
ELECT BLADE 4.0 EZ CLEAN MEGAD (MISCELLANEOUS) ×3
ELECT REM PT RETURN 9FT ADLT (ELECTROSURGICAL) ×3
ELECTRODE BLDE 4.0 EZ CLN MEGD (MISCELLANEOUS) ×1 IMPLANT
ELECTRODE REM PT RTRN 9FT ADLT (ELECTROSURGICAL) ×1 IMPLANT
EVACUATOR 1/8 PVC DRAIN (DRAIN) IMPLANT
GAUZE SPONGE 4X4 12PLY STRL (GAUZE/BANDAGES/DRESSINGS) ×3 IMPLANT
GAUZE SPONGE 4X4 16PLY XRAY LF (GAUZE/BANDAGES/DRESSINGS) IMPLANT
GLOVE BIO SURGEON STRL SZ7 (GLOVE) ×3 IMPLANT
GLOVE BIO SURGEON STRL SZ8 (GLOVE) ×6 IMPLANT
GLOVE BIO SURGEON STRL SZ8.5 (GLOVE) ×6 IMPLANT
GLOVE BIOGEL PI IND STRL 6.5 (GLOVE) ×1 IMPLANT
GLOVE BIOGEL PI IND STRL 7.0 (GLOVE) ×1 IMPLANT
GLOVE BIOGEL PI INDICATOR 6.5 (GLOVE) ×2
GLOVE BIOGEL PI INDICATOR 7.0 (GLOVE) ×2
GLOVE EXAM NITRILE LRG STRL (GLOVE) IMPLANT
GLOVE EXAM NITRILE XL STR (GLOVE) IMPLANT
GLOVE EXAM NITRILE XS STR PU (GLOVE) IMPLANT
GLOVE INDICATOR 8.5 STRL (GLOVE) ×3 IMPLANT
GLOVE SURG SS PI 6.5 STRL IVOR (GLOVE) ×3 IMPLANT
GLOVE SURG SS PI 7.5 STRL IVOR (GLOVE) ×12 IMPLANT
GOWN STRL REUS W/ TWL LRG LVL3 (GOWN DISPOSABLE) ×3 IMPLANT
GOWN STRL REUS W/ TWL XL LVL3 (GOWN DISPOSABLE) ×3 IMPLANT
GOWN STRL REUS W/TWL 2XL LVL3 (GOWN DISPOSABLE) IMPLANT
GOWN STRL REUS W/TWL LRG LVL3 (GOWN DISPOSABLE) ×6
GOWN STRL REUS W/TWL XL LVL3 (GOWN DISPOSABLE) ×6
HEMOSTAT POWDER KIT SURGIFOAM (HEMOSTASIS) IMPLANT
KIT BASIN OR (CUSTOM PROCEDURE TRAY) ×3 IMPLANT
KIT TURNOVER KIT B (KITS) ×3 IMPLANT
MILL MEDIUM DISP (BLADE) IMPLANT
NEEDLE HYPO 21X1.5 SAFETY (NEEDLE) ×3 IMPLANT
NEEDLE HYPO 22GX1.5 SAFETY (NEEDLE) ×3 IMPLANT
NS IRRIG 1000ML POUR BTL (IV SOLUTION) ×3 IMPLANT
OIL CARTRIDGE MAESTRO DRILL (MISCELLANEOUS) ×3
PACK LAMINECTOMY NEURO (CUSTOM PROCEDURE TRAY) ×3 IMPLANT
PAD ARMBOARD 7.5X6 YLW CONV (MISCELLANEOUS) ×18 IMPLANT
PATTIES SURGICAL .5 X1 (DISPOSABLE) IMPLANT
ROD REVERE 6.35 40MM (Rod) ×6 IMPLANT
SCREW REVERE 6.35 6.5X40MM (Screw) ×12 IMPLANT
SPACER ALTERA 10X31 8-12MM-8 (Spacer) ×3 IMPLANT
SPONGE LAP 4X18 RFD (DISPOSABLE) IMPLANT
SPONGE NEURO XRAY DETECT 1X3 (DISPOSABLE) IMPLANT
SPONGE SURGIFOAM ABS GEL 100 (HEMOSTASIS) IMPLANT
STRIP BIOACTIVE 20CC 25X100X8 (Miscellaneous) ×3 IMPLANT
STRIP CLOSURE SKIN 1/2X4 (GAUZE/BANDAGES/DRESSINGS) ×2 IMPLANT
SUT VIC AB 1 CT1 18XBRD ANBCTR (SUTURE) ×1 IMPLANT
SUT VIC AB 1 CT1 8-18 (SUTURE) ×2
SUT VIC AB 2-0 CP2 18 (SUTURE) ×6 IMPLANT
SYR 20CC LL (SYRINGE) ×3 IMPLANT
TOWEL GREEN STERILE (TOWEL DISPOSABLE) ×3 IMPLANT
TOWEL GREEN STERILE FF (TOWEL DISPOSABLE) ×3 IMPLANT
TRAY FOLEY MTR SLVR 16FR STAT (SET/KITS/TRAYS/PACK) ×3 IMPLANT
WATER STERILE IRR 1000ML POUR (IV SOLUTION) ×3 IMPLANT

## 2018-05-04 NOTE — OR Nursing (Signed)
Patient has very fragile skin. During the positioning process she obtained two skin tears to right lower forearm. After the procedure, both area on right lower forearm were cleaned with normal saline.Bacitracin ointment applied lightly,then telfa, arm wrapped with kling dressing. Phillipa Morden RN.

## 2018-05-04 NOTE — H&P (Signed)
Subjective: The patient is a 77 year old white female who complained of back and leg pain consistent with neurogenic claudication. She has failed medical management and was worked up with a lumbar MRI and lumbar x-rays. This demonstrated an L4-5 spinal listhesis with severe spinal stenosis. I discussed the various treatment options with the patient. She has decided to proceed with surgery.   Past Medical History:  Diagnosis Date  . Anemia   . Arthritis   . Heart murmur   . Hypertension   . Hypothyroidism   . Spondylolisthesis of lumbar region     Past Surgical History:  Procedure Laterality Date  . ABDOMINAL HYSTERECTOMY    . REPLACEMENT TOTAL KNEE BILATERAL    . SPINE SURGERY      Allergies  Allergen Reactions  . Pineapple Hives and Nausea And Vomiting  . Pregabalin Other (See Comments)    Pt fell and broke neck, does not want medication  . Motrin [Ibuprofen] Swelling  . Naproxen Swelling  . Tizanidine Other (See Comments)    Dizziness     Social History   Tobacco Use  . Smoking status: Never Smoker  . Smokeless tobacco: Never Used  Substance Use Topics  . Alcohol use: No    History reviewed. No pertinent family history. Prior to Admission medications   Medication Sig Start Date End Date Taking? Authorizing Provider  acetaminophen (TYLENOL) 650 MG CR tablet Take 650-1,300 mg by mouth every 8 (eight) hours as needed for pain.   Yes [provider]  aspirin EC 81 MG tablet Take 81 mg by mouth daily.   Yes [provider]  atorvastatin (LIPITOR) 20 MG tablet Take 20 mg by mouth every evening.    Yes [provider]  Cholecalciferol (VITAMIN D-3) 5000 units TABS Take 5,000 Units by mouth daily.    Yes [provider]  Coenzyme Q10 (COQ10) 200 MG CAPS Take 200 mg by mouth daily.    Yes [provider]  Diclofenac-Misoprostol (ARTHROTEC) 75-0.2 MG TBEC Take 1 tablet by mouth daily.    Yes [provider]  ferrous sulfate  325 (65 FE) MG tablet Take 325 mg by mouth every 3 (three) days.   Yes [provider]  furosemide (LASIX) 20 MG tablet Take 20 mg by mouth daily.    Yes [provider]  gabapentin (NEURONTIN) 300 MG capsule Take 300 mg by mouth 3 (three) times daily.   Yes [provider]  levothyroxine (SYNTHROID, LEVOTHROID) 75 MCG tablet Take 75 mcg by mouth daily before breakfast.   Yes [provider]  lidocaine (LIDODERM) 5 % Place 2 patches onto the skin daily as needed (for pain). Remove & Discard patch within 12 hours or as directed by MD   Yes [provider]  losartan (COZAAR) 100 MG tablet Take 100 mg by mouth every evening.    Yes [provider]  Multiple Vitamins-Minerals (PRESERVISION/LUTEIN) CAPS Take 1 capsule by mouth daily.    Yes [provider]  PARoxetine (PAXIL) 10 MG tablet Take 10 mg by mouth every evening.    Yes [provider]  Tetrahydrozoline HCl (VISINE OP) Place 2 drops into both eyes every evening.   Yes [provider]  traMADol (ULTRAM) 50 MG tablet Take 50 mg by mouth every evening.    Yes [provider]  ondansetron (ZOFRAN ODT) 8 MG disintegrating tablet Take 1 tablet (8 mg total) by mouth every 8 (eight) hours as needed for nausea or vomiting. Patient  not taking: Reported on 04/20/2018 11/12/16   Hassan Rowan, MD  oxyCODONE-acetaminophen (PERCOCET/ROXICET) 5-325 MG tablet Take 1 tablet by mouth every 8 (eight) hours as needed for severe pain. Patient not taking: Reported on 04/20/2018 11/12/16   Hassan Rowan, MD  sucralfate (CARAFATE) 1 g tablet Take 2 tablets (2 g total) by mouth 2 (two) times daily. An hour before eating. Patient not taking: Reported on 04/20/2018 11/13/16   Payton Mccallum, MD     Review of Systems  Positive ROS: as above  All other systems have been reviewed and were otherwise negative with the exception of those mentioned in the HPI and as above.  Objective: Vital  signs in last 24 hours: Temp:  [98.1 F (36.7 C)] 98.1 F (36.7 C) (07/15 0902) Pulse Rate:  [86] 86 (07/15 0902) Resp:  [18] 18 (07/15 0902) BP: (153)/(87) 153/87 (07/15 0902) SpO2:  [100 %] 100 % (07/15 0902) Weight:  [74.8 kg (165 lb)-76.2 kg (168 lb)] 74.8 kg (165 lb) (07/15 0902) Estimated body mass index is 31.18 kg/m as calculated from the following:   Height as of this encounter: 5\' 1"  (1.549 m).   Weight as of this encounter: 74.8 kg (165 lb).   General Appearance: Alert Head: Normocephalic, without obvious abnormality, atraumatic Eyes: PERRL, conjunctiva/corneas clear, EOM's intact,    Ears: Normal  Throat: Normal  Neck: Supple, Back: unremarkable Lungs: Clear to auscultation bilaterally, respirations unlabored Heart: Regular rate and rhythm, no murmur, rub or gallop Abdomen: Soft, non-tender Extremities: Extremities normal, atraumatic, no cyanosis or edema Skin: unremarkable  NEUROLOGIC:   Mental status: alert and oriented,Motor Exam - grossly normal Sensory Exam - grossly normal Reflexes:  Coordination - grossly normal Gait - grossly normal Balance - grossly normal Cranial Nerves: I: smell Not tested  II: visual acuity  OS: Normal  OD: Normal   II: visual fields Full to confrontation  II: pupils Equal, round, reactive to light  III,VII: ptosis None  III,IV,VI: extraocular muscles  Full ROM  V: mastication Normal  V: facial light touch sensation  Normal  V,VII: corneal reflex  Present  VII: facial muscle function - upper  Normal  VII: facial muscle function - lower Normal  VIII: hearing Not tested  IX: soft palate elevation  Normal  IX,X: gag reflex Present  XI: trapezius strength  5/5  XI: sternocleidomastoid strength 5/5  XI: neck flexion strength  5/5  XII: tongue strength  Normal    Data Review Lab Results  Component Value Date   WBC 7.7 04/27/2018   HGB 11.9 (L) 04/27/2018   HCT 38.3 04/27/2018   MCV 98.0 04/27/2018   PLT 251 04/27/2018    Lab Results  Component Value Date   NA 134 (L) 04/27/2018   K 4.7 04/27/2018   CL 102 04/27/2018   CO2 23 04/27/2018   BUN 17 04/27/2018   CREATININE 0.61 04/27/2018   GLUCOSE 87 04/27/2018   No results found for: INR, PROTIME  Assessment/Plan: L4-5 spondylolisthesis, spinal stenosis, lumbago, lumbar radiculopathy, neurogenic claudication, facet arthropathy: I have discussed situation with the patient. I reviewed her imaging studies with her and pointed out that abnormalities. We have discussed the various treatment options including surgery. I described the surgical treatment option of an L4-5 decompression, instrumentation, and fusion. I have shown her surgical models. I have given her surgical pamphlet. We have discussed the risks, benefits, alternatives, expected postoperative course, and likelihood of achieving her goals with surgery. I have answered all her questions. She  has decided to proceed with surgery.   Cristi LoronJeffrey D Anthonette Lesage 05/04/2018 11:33 AM

## 2018-05-04 NOTE — Op Note (Signed)
Brief history:the patient is a 77 year old white female who has complained of back and leg pain consistent with neurogenic claudication. She has failed medical management. She has been worked up with lumbar x-rays and lumbar MRI which demonstrated an L4-5 spondylolisthesis and severe spinal stenosis. I discussed the various treatment options with the patient including surgery. She has weighed the risks, benefits and alternatives to surgery and decided to proceed with an L4-5 decompression, instrumentation, and fusion.  Preoperative diagnosis:L4-5 spondylolisthesis, facet arthropathy, spinal stenosis,Degenerative disc disease, spinal stenosis compressing both the L4and the L5 nerve roots; lumbago; lumbar radiculopathy: Neurogenic claudication  Postoperative diagnosis:the same  Procedure:bilateral L4-5Laminotomy/foraminotomies to decompress the bilateral L4-5nerve roots(the work required to do this was in addition to the work required to do the posterior lumbar interbody fusion because of the patient's spinal stenosis, facet arthropathy. Etc. requiring a wide decompression of the nerve roots.);L4-5 transforaminal lumbar interbody fusion with local morselized autograft bone and Kinnex graft extender; insertion of interbody prosthesis atL4-5(globus peek expandable interbody prosthesis); posteriornonsegmentalinstrumentation fromL4 toL5with globus titanium pedicle screws and rods; posterior lateral arthrodesis atL4-5with local morselized autograft bone and Kinnex bone graft extender.  Surgeon: Dr. Delma OfficerJeff Ameria Sanjurjo  Asst.:Dr. Donalee CitrinGary Cram  Anesthesia: Gen. endotracheal  Estimated blood loss:250 mL  Drains: None  Complications: None  Description of procedure: The patient was brought to the operating room by the anesthesia team. General endotracheal anesthesia was induced. The patient was turned to the prone position on the Wilson frame. The patient's lumbosacral region was then prepared with Betadine scrub and  Betadine solution. Sterile drapes were applied.  I then injected the area to be incised with Marcaine with epinephrine solution. I then used the scalpel to make a linear midline incision over theL4-5interspace. I then used electrocautery to perform a bilateral subperiosteal dissection exposing the spinous process and lamina ofL4 and L5. We then obtained intraoperative radiograph to confirm our location. We then inserted the Verstrac retractor to provide exposure.  I began the decompression by using the high speed drill to perform laminotomies atL4-5 bilaterally. We then used the Kerrison punches to widen the laminotomy and removed the ligamentum flavum atL4-5 bilaterally. We used the Kerrison punches to remove the medial facets atL4-5 bilaterally. We performed wide foraminotomies about the bilateralL4 and L5nerve roots completing the decompression.  We now turned our attention to the posterior lumbar interbody fusion. I used a scalpel to incise the intervertebral disc atL4-5 bilaterally. I then performed a partial intervertebral discectomy atL4-5 bilaterallyusing the pituitary forceps. We prepared the vertebral endplates atL4-5 bilaterallyfor the fusion by removing the soft tissues with the curettes. We then used the trial spacers to pick the appropriate sized interbody prosthesis. We prefilled his prosthesis with a combination of local morselized autograft bone that we obtained during the decompression as well as Kinnex bone graft extender. We inserted the prefilled prosthesis into the interspace atL4-5, we then expanded the prosthesis. There was a good snug fit of the prosthesis in the interspace. We then filled and the remainder of the intervertebral disc space with local morselized autograft bone and Kinnex. This completed the posterior lumbar interbody arthrodesis.  We now turned attention to the instrumentation. Under fluoroscopic guidance we cannulated the bilateralL4 and L5pedicles with the bone  probe. We then removed the bone probe. We then tapped the pedicle with a5.385millimeter tap. We then removed the tap. We probed inside the tapped pedicle with a ball probe to rule out cortical breaches. We then inserted a 6.5 x 40millimeter pedicle screw into the  L4 and L5pedicles bilaterally under fluoroscopic guidance. We then palpated along the medial aspect of the pedicles to rule out cortical breaches. There were none. The nerve roots were not injured. We then connected the unilateral pedicle screws with a lordotic rod. We compressed the construct and secured the rod in place with the caps. We then tightened the caps appropriately. This completed the instrumentation fromL4-5 bilaterally.  We now turned our attention to the posterior lateral arthrodesis atL4-5 bilaterally. We used the high-speed drill to decorticate the remainder of the facets, pars, transverse process atL4-5 bilaterally. We then applied a combination of local morselized autograft bone and Kinnex bone graft extender over these decorticated posterior lateral structures. This completed the posterior lateral arthrodesis.  We then obtained hemostasis using bipolar electrocautery. We irrigated the wound out with bacitracin solution. We inspected the thecal sac and nerve roots and noted they were well decompressed. We then removed the retractor. We placed vancomycin powder in the wound. We reapproximated patient's thoracolumbar fascia with interrupted #1 Vicryl suture. We reapproximated patient's subcutaneous tissue with interrupted 2-0 Vicryl suture. The reapproximated patient's skin with Steri-Strips and benzoin. The wound was then coated with bacitracin ointment. A sterile dressing was applied. The drapes were removed. The patient was subsequently returned to the supine position where they were extubated by the anesthesia team. He was then transported to the post anesthesia care unit in stable condition. All sponge instrument and needle counts  were reportedly correct at the end of this case.

## 2018-05-04 NOTE — Progress Notes (Signed)
Subjective: The patient is alert and pleasant.  Objective: Vital signs in last 24 hours: Temp:  [97.2 F (36.2 C)-98.1 F (36.7 C)] 97.7 F (36.5 C) (07/15 1635) Pulse Rate:  [67-86] 67 (07/15 1620) Resp:  [12-18] 15 (07/15 1620) BP: (146-154)/(67-87) 146/74 (07/15 1620) SpO2:  [93 %-100 %] 93 % (07/15 1620) Weight:  [74.8 kg (165 lb)-76.2 kg (168 lb)] 74.8 kg (165 lb) (07/15 0902) Estimated body mass index is 31.18 kg/m as calculated from the following:   Height as of this encounter: 5\' 1"  (1.549 m).   Weight as of this encounter: 74.8 kg (165 lb).   Intake/Output from previous day: No intake/output data recorded. Intake/Output this shift: Total I/O In: 1800 [I.V.:1800] Out: 580 [Urine:380; Blood:200]  Physical exam patient is alert and pleasant.  She is moving her lower extremities well.  Lab Results: No results for input(s): WBC, HGB, HCT, PLT in the last 72 hours. BMET No results for input(s): NA, K, CL, CO2, GLUCOSE, BUN, CREATININE, CALCIUM in the last 72 hours.  Studies/Results: Dg Lumbar Spine 2-3 Views  Result Date: 05/04/2018 CLINICAL DATA:  77 year old female undergoing lumbar surgery. EXAM: DG C-ARM 61-120 MIN; LUMBAR SPINE - 2-3 VIEW COMPARISON:  Intraoperative images at 1308 hours today. Outside lumbar MRI 01/18/2018. outside lumbar radiographs 01/18/2018. FINDINGS: Two intraoperative fluoroscopic spot views of the lower lumbar spine in the AP and lateral projections. Normal lumbar segmentation noted on 01/18/2018, the same numbering system used on the intraoperative image today. These images demonstrate posterior and interbody fusion hardware placement at L4-L5. Posterior connecting rods are not yet in place. FLUOROSCOPY TIME:  0 minutes 22 seconds IMPRESSION: Posterior and interbody fusion underway at L4-L5. Electronically Signed   By: Odessa FlemingH  Hall M.D.   On: 05/04/2018 15:28   Dg Lumbar Spine 1 View  Result Date: 05/04/2018 CLINICAL DATA:  L4-5 PLIF EXAM: LUMBAR  SPINE - 1 VIEW COMPARISON:  Lumbar spine radiographs 01/18/2018 FINDINGS: Five lumbarized vertebral bodies are counted. Lowest disc space L5-S1 Grade 1 anterolisthesis L4-5. Disc degeneration and spurring L2 through S1 Surgical instruments are located in the posterior soft tissues at the L4-5 level. Surgical instrument posterior to the lamina at L4-5 IMPRESSION: Surgical localization at the L4-5 level. Electronically Signed   By: Marlan Palauharles  Clark M.D.   On: 05/04/2018 14:27   Dg C-arm 1-60 Min  Result Date: 05/04/2018 CLINICAL DATA:  77 year old female undergoing lumbar surgery. EXAM: DG C-ARM 61-120 MIN; LUMBAR SPINE - 2-3 VIEW COMPARISON:  Intraoperative images at 1308 hours today. Outside lumbar MRI 01/18/2018. outside lumbar radiographs 01/18/2018. FINDINGS: Two intraoperative fluoroscopic spot views of the lower lumbar spine in the AP and lateral projections. Normal lumbar segmentation noted on 01/18/2018, the same numbering system used on the intraoperative image today. These images demonstrate posterior and interbody fusion hardware placement at L4-L5. Posterior connecting rods are not yet in place. FLUOROSCOPY TIME:  0 minutes 22 seconds IMPRESSION: Posterior and interbody fusion underway at L4-L5. Electronically Signed   By: Odessa FlemingH  Hall M.D.   On: 05/04/2018 15:28    Assessment/Plan: The patient is doing well.  I spoke with her family.  LOS: 0 days     Cristi LoronJeffrey D Lanaysia Fritchman 05/04/2018, 4:45 PM

## 2018-05-04 NOTE — Transfer of Care (Signed)
Immediate Anesthesia Transfer of Care Note  Patient: Sheral ApleyLois Barnfield  Procedure(s) Performed: POSTERIOR LUMBAR INTERBODY FUSION, INTERBODY PROSTHESIS, POSTERIOR INSTRUMENTATION/FUSION LUMBAR FOUR- LUMBAR FIVE (N/A Spine Lumbar)  Patient Location: PACU  Anesthesia Type:General  Level of Consciousness: awake, alert  and oriented  Airway & Oxygen Therapy: Patient Spontanous Breathing and Patient connected to nasal cannula oxygen  Post-op Assessment: Report given to RN and Post -op Vital signs reviewed and stable  Post vital signs: Reviewed and stable  Last Vitals:  Vitals Value Taken Time  BP 154/68 05/04/2018  3:49 PM  Temp    Pulse 74 05/04/2018  3:50 PM  Resp 17 05/04/2018  3:50 PM  SpO2 100 % 05/04/2018  3:50 PM  Vitals shown include unvalidated device data.  Last Pain:  Vitals:   05/04/18 0917  TempSrc:   PainSc: 4          Complications: No apparent anesthesia complications

## 2018-05-04 NOTE — Anesthesia Preprocedure Evaluation (Addendum)
Anesthesia Evaluation  Patient identified by MRN, date of birth, ID band Patient awake    Reviewed: Allergy & Precautions, NPO status , Patient's Chart, lab work & pertinent test results  History of Anesthesia Complications (+) Emergence Delirium  Airway Mallampati: II  TM Distance: >3 FB Neck ROM: Full    Dental no notable dental hx. (+) Teeth Intact, Dental Advisory Given   Pulmonary asthma ,    Pulmonary exam normal breath sounds clear to auscultation       Cardiovascular hypertension, Normal cardiovascular exam Rhythm:Regular Rate:Normal     Neuro/Psych R UE weakness s/p neck fusion. In addition. R arm w Supraspinatous  Tear unable to lift R arm over her head.   Neuromuscular disease    GI/Hepatic   Endo/Other  Hypothyroidism   Renal/GU      Musculoskeletal  (+) Arthritis ,   Abdominal   Peds  Hematology  (+) anemia ,   Anesthesia Other Findings   Reproductive/Obstetrics                           Lab Results  Component Value Date   CREATININE 0.61 04/27/2018   BUN 17 04/27/2018   NA 134 (L) 04/27/2018   K 4.7 04/27/2018   CL 102 04/27/2018   CO2 23 04/27/2018      Lab Results  Component Value Date   WBC 7.7 04/27/2018   HGB 11.9 (L) 04/27/2018   HCT 38.3 04/27/2018   MCV 98.0 04/27/2018   PLT 251 04/27/2018    Anesthesia Physical Anesthesia Plan  ASA: III  Anesthesia Plan: General   Post-op Pain Management:    Induction: Intravenous  PONV Risk Score and Plan:   Airway Management Planned: Oral ETT  Additional Equipment:   Intra-op Plan:   Post-operative Plan: Extubation in OR  Informed Consent: I have reviewed the patients History and Physical, chart, labs and discussed the procedure including the risks, benefits and alternatives for the proposed anesthesia with the patient or authorized representative who has indicated his/her understanding and  acceptance.   Dental advisory given  Plan Discussed with: CRNA and Anesthesiologist  Anesthesia Plan Comments: (Positioning concerns disccussed )       Anesthesia Quick Evaluation

## 2018-05-04 NOTE — Anesthesia Procedure Notes (Signed)
Procedure Name: Intubation Date/Time: 05/04/2018 12:28 PM Performed by: Marena ChancyBeckner, Shaton Lore S, CRNA Pre-anesthesia Checklist: Patient identified, Emergency Drugs available, Suction available, Patient being monitored and Timeout performed Patient Re-evaluated:Patient Re-evaluated prior to induction Oxygen Delivery Method: Circle system utilized Preoxygenation: Pre-oxygenation with 100% oxygen Induction Type: IV induction Ventilation: Mask ventilation without difficulty and Oral airway inserted - appropriate to patient size Laryngoscope Size: Hyacinth MeekerMiller and 2 Grade View: Grade I Tube size: 7.0 mm Number of attempts: 1 Airway Equipment and Method: Stylet Placement Confirmation: ETT inserted through vocal cords under direct vision,  positive ETCO2 and breath sounds checked- equal and bilateral Secured at: 22 cm Tube secured with: Tape Dental Injury: Teeth and Oropharynx as per pre-operative assessment

## 2018-05-05 LAB — BASIC METABOLIC PANEL
ANION GAP: 8 (ref 5–15)
BUN: 9 mg/dL (ref 8–23)
CALCIUM: 8.8 mg/dL — AB (ref 8.9–10.3)
CO2: 25 mmol/L (ref 22–32)
CREATININE: 0.68 mg/dL (ref 0.44–1.00)
Chloride: 104 mmol/L (ref 98–111)
GFR calc Af Amer: >60 mL/min (ref >60)
Glucose, Bld: 144 mg/dL — ABNORMAL HIGH (ref 70–99)
Potassium: 4.1 mmol/L (ref 3.5–5.1)
Sodium: 137 mmol/L (ref 135–145)

## 2018-05-05 LAB — CBC
HCT: 32.2 % — ABNORMAL LOW (ref 36.0–46.0)
Hemoglobin: 10.3 g/dL — ABNORMAL LOW (ref 12.0–15.0)
MCH: 30.2 pg (ref 26.0–34.0)
MCHC: 32 g/dL (ref 30.0–36.0)
MCV: 94.4 fL (ref 78.0–100.0)
PLATELETS: 228 10*3/uL (ref 150–400)
RBC: 3.41 MIL/uL — ABNORMAL LOW (ref 3.87–5.11)
RDW: 13.2 % (ref 11.5–15.5)
WBC: 11.2 10*3/uL — AB (ref 4.0–10.5)

## 2018-05-05 MED ORDER — OXYCODONE HCL 5 MG PO TABS: 5.0000 mg | ORAL_TABLET | ORAL | 0 refills | Status: AC | PRN

## 2018-05-05 MED ORDER — DOCUSATE SODIUM 100 MG PO CAPS: 100.0000 mg | ORAL_CAPSULE | Freq: Two times a day (BID) | ORAL | 0 refills | Status: AC

## 2018-05-05 NOTE — Evaluation (Signed)
Occupational Therapy Evaluation Patient Details Name: Evelyn Cisneros MRN: [id number] DOB: 11/21/1940 Today's Date: 05/05/2018    History of Present Illness 77 yo female s/p bilateral L4-5Laminotomy/foraminotomies to decompress the bilateral L4-5nerve roots. PMH including bilateral TKAs, anemia, heart murmur, and HTN.   Clinical Impression   PTA, pt was living with her husband who assisted with ADLs and IADLs. Pt currently requiring Min-Max A for UB ADLs, Min A for LB ADLs with AE, and Min Guard A-Min A for functional mobility with RW. Providing education on back precautions, bed mobility, brace management, UB ADLs, LB ADLs with AE, toileting, and functional transfers. Answering pt and husband questions in preparation for dc later today. Pt would benefit from further acute OT to facilitate safe dc. Recommend dc to home with HHOT for further OT to optimize safety, independence with ADLs, and return to PLOF.      Follow Up Recommendations  Home health OT    Equipment Recommendations  None recommended by OT    Recommendations for Other Services PT consult     Precautions / Restrictions Precautions Precautions: Back;Fall Precaution Booklet Issued: Yes (comment) Precaution Comments: Reviewed all back precautions and adherance during ADLs Required Braces or Orthoses: Spinal Brace Spinal Brace: Lumbar corset;Applied in sitting position Restrictions Weight Bearing Restrictions: No      Mobility Bed Mobility               General bed mobility comments: Pt sitting EOB upon arrival  Transfers Overall transfer level: Needs assistance Equipment used: Rolling walker (2 wheeled) Transfers: Sit to/from Stand Sit to Stand: Min assist;Min guard         General transfer comment: Min A for safety in standing. Overall, close [employer] A for safety    Balance Overall balance assessment: Needs assistance Sitting-balance support: No upper extremity supported;Feet supported Sitting  balance-Leahy Scale: Fair     Standing balance support: No upper extremity supported;During functional activity Standing balance-Leahy Scale: Fair Standing balance comment: Able to maintain static standing without UE support                           ADL either performed or assessed with clinical judgement   ADL Overall ADL's : Needs assistance/impaired Eating/Feeding: Set up;Sitting   Grooming: Minimal assistance;Sitting;With caregiver independent assisting Grooming Details (indicate cue type and reason): Min A for fine motor tasks and bilateral coordination Upper Body Bathing: Minimal assistance;Sitting   Lower Body Bathing: Minimal assistance;Sit to/from stand Lower Body Bathing Details (indicate cue type and reason): Educating pt on AE for bathing Upper Body Dressing : Minimal assistance;With caregiver independent assisting;Sitting;Maximal assistance Upper Body Dressing Details (indicate cue type and reason): Pt requiring assistance for donning bra from husband with MIn A. Requiring Max A from husband for donning/doffing brace.  Lower Body Dressing: Minimal assistance;Sit to/from stand;With adaptive equipment Lower Body Dressing Details (indicate cue type and reason): Providing pt with education on use of reacher for donning pants. Pt requiring Min A and increased time. Pt also reporting husband may continue to assist for time management, but was eager to learn new technique for increasing independence Toilet Transfer: Minimal assistance;RW(Simulated in room) Toilet Transfer Details (indicate cue type and reason): Min A for balance in standing Toileting- Clothing Manipulation and Hygiene: Set up;Supervision/safety;Sitting/lateral lean Toileting - Clothing Manipulation Details (indicate cue type and reason): Providing education on toielt hygiene techniques with lateral leans   Tub/Shower Transfer Details (indicate cue type and reason): Discussed  safe shower transfer. Pt and  husband verbalizing prior techniques and understanding of safety Functional mobility during ADLs: Minimal assistance;Rolling walker General ADL Comments: Pt requiring Min A for ADLs and fucntional mobility due to decreased strength and balance. Providing education on back precautions, brace management, UB ADLs, LB ADLs, and functional transfers.     Vision Baseline Vision/History: Wears glasses Wears Glasses: Reading only Patient Visual Report: No change from baseline       Perception     Praxis      Pertinent Vitals/Pain Pain Assessment: Faces Faces Pain Scale: Hurts even more Pain Location: Right side of lower back Pain Descriptors / Indicators: Discomfort;Grimacing;Constant;Moaning Pain Intervention(s): Monitored during session;Limited activity within patient's tolerance;Repositioned;Patient requesting pain meds-RN notified     Hand Dominance Left   Extremity/Trunk Assessment Upper Extremity Assessment Upper Extremity Assessment: RUE deficits/detail RUE Deficits / Details: Decreased gross motor, fine motor, and grasp strength. Pt with decreased fine motor dexerity and limited ROM of fourth and fifth digit. Decreased sensation RUE Sensation: decreased light touch RUE Coordination: decreased fine motor;decreased gross motor   Lower Extremity Assessment Lower Extremity Assessment: Defer to PT evaluation   Cervical / Trunk Assessment Cervical / Trunk Assessment: Other exceptions Cervical / Trunk Exceptions: s/p bilateral L4-5Laminotomy/foraminotomies to decompress the bilateral L4-5nerve roots   Communication Communication Communication: No difficulties   Cognition Arousal/Alertness: Awake/alert Behavior During Therapy: WFL for tasks assessed/performed Overall Cognitive Status: Within Functional Limits for tasks assessed                                 General Comments: Pt with decreased ST memory and slower processing as seen during ADLs. Feel this is  close to pt's baseline cognition.   General Comments  Husband present throughout session    Exercises     Shoulder Instructions      Home Living Family/patient expects to be discharged to:: Private residence Living Arrangements: Spouse/significant other Available Help at Discharge: Family;Available 24 hours/day Type of Home: House Home Access: Stairs to enter Entergy CorporationEntrance Stairs-Number of Steps: 1   Home Layout: Two level;Able to live on main level with bedroom/bathroom     Bathroom Shower/Tub: Producer, television/film/videoWalk-in shower   Bathroom Toilet: Standard     Home Equipment: Environmental consultantWalker - 2 wheels;Cane - single point;Bedside commode;Shower seat - built in;Grab bars - tub/shower;Adaptive equipment;Wheelchair - Equities tradermanual Adaptive Equipment: Reacher        Prior Functioning/Environment Level of Independence: Needs assistance  Gait / Transfers Assistance Needed: RW for functional mobility ADL's / Homemaking Assistance Needed: Husband assisting with ADLs and IADLs. Pt able to perform BADLs with increased time and effort.             OT Problem List: Decreased strength;Decreased range of motion;Decreased activity tolerance;Impaired balance (sitting and/or standing);Decreased knowledge of use of DME or AE;Decreased knowledge of precautions;Pain      OT Treatment/Interventions: Self-care/ADL training;Therapeutic exercise;Energy conservation;DME and/or AE instruction;Therapeutic activities;Patient/family education    OT Goals(Current goals can be found in the care plan section) Acute Rehab OT Goals Patient Stated Goal: Go home OT Goal Formulation: With patient/family Time For Goal Achievement: 05/19/18 Potential to Achieve Goals: Good ADL Goals Pt Will Perform Upper Body Dressing: with set-up;with supervision;with caregiver independent in assisting;sitting Pt Will Perform Lower Body Dressing: with set-up;with supervision;sit to/from stand;with adaptive equipment;with caregiver independent in  assisting Pt Will Transfer to Toilet: with set-up;with supervision;ambulating;bedside commode Pt Will Perform Toileting - Clothing  Manipulation and hygiene: with set-up;with supervision;sitting/lateral leans Additional ADL Goal #1: Pt will perform bed mobility adhering to back precautions with supervision and assistance from husband  OT Frequency: Min 2X/week   Barriers to D/C:            Co-evaluation              AM-PAC PT "6 Clicks" Daily Activity     Outcome Measure Help from another person eating meals?: A Little Help from another person taking care of personal grooming?: A Little Help from another person toileting, which includes using toliet, bedpan, or urinal?: A Little Help from another person bathing (including washing, rinsing, drying)?: A Little Help from another person to put on and taking off regular upper body clothing?: A Lot Help from another person to put on and taking off regular lower body clothing?: A Little 6 Click Score: 17   End of Session Equipment Utilized During Treatment: Rolling walker;Back brace Nurse Communication: Mobility status;Precautions  Activity Tolerance: Patient tolerated treatment well Patient left: in bed;with call bell/phone within reach;with nursing/sitter in room;with family/visitor present(At EOB with breakfast)  OT Visit Diagnosis: Unsteadiness on feet (R26.81);Other abnormalities of gait and mobility (R26.89);Muscle weakness (generalized) (M62.81);Pain Pain - part of body: (Back)                Time: 1610-9604 OT Time Calculation (min): 26 min Charges:  OT General Charges $OT Visit: 1 Visit OT Evaluation $OT Eval Moderate Complexity: 1 Mod OT Treatments $Self Care/Home Management : 8-22 mins G-Codes:     Evelyn Cisneros MSOT, OTR/L Acute Rehab Pager: [phone] Office: [phone]  Theodoro Grist Cletus Paris 05/05/2018, 9:35 AM

## 2018-05-05 NOTE — Anesthesia Postprocedure Evaluation (Signed)
Anesthesia Post Note  Patient: Evelyn Cisneros  Procedure(s) Performed: POSTERIOR LUMBAR INTERBODY FUSION, INTERBODY PROSTHESIS, POSTERIOR INSTRUMENTATION/FUSION LUMBAR FOUR- LUMBAR FIVE (N/A Spine Lumbar)     Patient location during evaluation: PACU Anesthesia Type: General Level of consciousness: awake and alert Pain management: pain level controlled Vital Signs Assessment: post-procedure vital signs reviewed and stable Respiratory status: spontaneous breathing, nonlabored ventilation, respiratory function stable and patient connected to nasal cannula oxygen Cardiovascular status: blood pressure returned to baseline and stable Postop Assessment: no apparent nausea or vomiting Anesthetic complications: no    Last Vitals:  Vitals:   05/05/18 0355 05/05/18 0737  BP: (!) 107/50 (!) 110/57  Pulse: 80 67  Resp: 18 18  Temp: 36.8 C (!) 36.3 C  SpO2: 99% 100%    Last Pain:  Vitals:   05/05/18 0925  TempSrc:   PainSc: 4                  Evelyn Cisneros P [name]

## 2018-05-05 NOTE — Evaluation (Signed)
Physical Therapy Evaluation Patient Details Name: Evelyn Cisneros MRN: [id number] DOB: 11/17/1940 Today's Date: 05/05/2018   History of Present Illness  77 yo female s/p bilateral L4-5Laminotomy/foraminotomies to decompress the bilateral L4-5nerve roots. PMH including bilateral TKAs, anemia, heart murmur, and HTN. Pt also reporting an accident several years ago that left her with a weak R side.  Clinical Impression  Pt admitted with above diagnosis. Pt currently with functional limitations due to the deficits listed below (see PT Problem List). At the time of PT eval pt was able to perform transfers and ambulation with gross min guard assist to min assist for balance support and safety with basic transfers. Pt with mobility deficits at baseline from a previous accident/injury, and feel HHPT would beneficial to maximize functional independence and return to PLOF. Acutely, pt will benefit from skilled PT to increase their independence and safety with mobility to allow discharge to the venue listed below.       Follow Up Recommendations Home health PT;Supervision for mobility/OOB    Equipment Recommendations  None recommended by PT    Recommendations for Other Services       Precautions / Restrictions Precautions Precautions: Back;Fall Precaution Booklet Issued: Yes (comment) Precaution Comments: Reviewed all back precautions and adherance during functional mobility. Required Braces or Orthoses: Spinal Brace Spinal Brace: Lumbar corset;Applied in sitting position Restrictions Weight Bearing Restrictions: No      Mobility  Bed Mobility Overal bed mobility: Needs Assistance Bed Mobility: Rolling;Sidelying to Sit Rolling: Min assist Sidelying to sit: Min assist       General bed mobility comments: Pt exited the L side of the bed to simulate home environment. Pt was able to bend up R knee some but required assist to bend it up enough to gain leverage to initiate rolling. Assist provided  at the hips and shoulders to fully roll to her L side. Assist then provided to elevate trunk to full sitting position.   Transfers Overall transfer level: Needs assistance Equipment used: Rolling walker (2 wheeled) Transfers: Sit to/from Stand Sit to Stand: Min assist;Min guard         General transfer comment: Assist to power-up to full stand after x2 attempts. Min guard for safety in static standing.   Ambulation/Gait Ambulation/Gait assistance: Min guard Gait Distance (Feet): 175 Feet Assistive device: Rolling walker (2 wheeled) Gait Pattern/deviations: Step-through pattern;Decreased stride length;Trunk flexed Gait velocity: Decreased Gait velocity interpretation: 1.31 - 2.62 ft/sec, indicative of limited community ambulator General Gait Details: VC's for improved posture and general safety. Pt moving slowly but overall steady with BUE support.   Stairs Stairs: (Pt declined stair training this session. )          Wheelchair Mobility    Modified Rankin (Stroke Patients Only)       Balance Overall balance assessment: Needs assistance Sitting-balance support: No upper extremity supported;Feet supported Sitting balance-Leahy Scale: Fair     Standing balance support: No upper extremity supported;During functional activity Standing balance-Leahy Scale: Fair Standing balance comment: Able to maintain static standing without UE support                             Pertinent Vitals/Pain Pain Assessment: Faces Faces Pain Scale: Hurts even more Pain Location: Right side of lower back Pain Descriptors / Indicators: Discomfort;Grimacing;Constant;Moaning Pain Intervention(s): Monitored during session;Limited activity within patient's tolerance;Repositioned    Home Living Family/patient expects to be discharged to:: Private residence Living Arrangements: Spouse/significant  other Available Help at Discharge: Family;Available 24 hours/day Type of Home:  House Home Access: Stairs to enter   Enter[employer] CorporationEntrance Stairs-Number of Steps: 1 Home Layout: Two level;Able to live on main level with bedroom/bathroom Home Equipment: Dan HumphreysWalker - 2 wheels;Cane - single point;Bedside commode;Shower seat - built in;Grab bars - tub/shower;Adaptive equipment;Wheelchair - manual      Prior Function Level of Independence: Needs assistance   Gait / Transfers Assistance Needed: RW for functional mobility  ADL's / Homemaking Assistance Needed: Husband assisting with ADLs and IADLs. Pt able to perform BADLs with increased time and effort.         Hand Dominance   Dominant Hand: Left    Extremity/Trunk Assessment   Upper Extremity Assessment Upper Extremity Assessment: Defer to OT evaluation RUE Deficits / Details: Decreased gross motor, fine motor, and grasp strength. Pt with decreased fine motor dexerity and limited ROM of fourth and fifth digit. Decreased sensation RUE Sensation: decreased light touch RUE Coordination: decreased fine motor;decreased gross motor    Lower Extremity Assessment Lower Extremity Assessment: RLE deficits/detail RLE Deficits / Details: Decreased gross motor. Pt reports feeling her RLE is weaker than the RUE, however functionally she is managing well with the RLE and a RW for support.     Cervical / Trunk Assessment Cervical / Trunk Assessment: Other exceptions Cervical / Trunk Exceptions: s/p bilateral L4-5Laminotomy/foraminotomies to decompress the bilateral L4-5nerve roots  Communication   Communication: No difficulties  Cognition Arousal/Alertness: Awake/alert Behavior During Therapy: WFL for tasks assessed/performed Overall Cognitive Status: Within Functional Limits for tasks assessed                                 General Comments: Pt with decreased ST memory and slower processing as seen during ADLs. Feel this is close to pt's baseline cognition.      General Comments General comments (skin integrity,  edema, etc.): Husband present throughout session    Exercises     Assessment/Plan    PT Assessment Patient needs continued PT services  PT Problem List Decreased strength;Decreased range of motion;Decreased activity tolerance;Decreased balance;Decreased mobility;Decreased knowledge of use of DME;Decreased safety awareness;Decreased knowledge of precautions;Pain       PT Treatment Interventions DME instruction;Gait training;Stair training;Functional mobility training;Therapeutic activities;Therapeutic exercise;Neuromuscular re-education;Patient/family education    PT Goals (Current goals can be found in the Care Plan section)  Acute Rehab PT Goals Patient Stated Goal: Go home PT Goal Formulation: With patient/family Time For Goal Achievement: 05/12/18 Potential to Achieve Goals: Good    Frequency Min 5X/week   Barriers to discharge        Co-evaluation               AM-PAC PT "6 Clicks" Daily Activity  Outcome Measure Difficulty turning over in bed (including adjusting bedclothes, sheets and blankets)?: Unable Difficulty moving from lying on back to sitting on the side of the bed? : Unable Difficulty sitting down on and standing up from a chair with arms (e.g., wheelchair, bedside commode, etc,.)?: Unable Help needed moving to and from a bed to chair (including a wheelchair)?: A Little Help needed walking in hospital room?: A Little Help needed climbing 3-5 steps with a railing? : A Little 6 Click Score: 12    End of Session Equipment Utilized During Treatment: Gait belt;Back brace Activity Tolerance: Patient tolerated treatment well Patient left: with call bell/phone within reach;with family/visitor present(Sitting EOB with OT present)  Nurse Communication: Mobility status PT Visit Diagnosis: Unsteadiness on feet (R26.81);Pain Pain - part of body: (back)    Time: 1610-9604 PT Time Calculation (min) (ACUTE ONLY): 25 min   Charges:   PT Evaluation $PT Eval  Moderate Complexity: 1 Mod PT Treatments $Gait Training: 8-22 mins   PT G Codes:        Conni Slipper, PT, DPT Acute Rehabilitation Services Pager: [phone]   Marylynn Pearson 05/05/2018, 10:44 AM

## 2018-05-05 NOTE — Progress Notes (Signed)
Patient is discharged from room 3C10 at this time. Alert and in stable condition. IV site d/c'd and instructions read to patient and spouse with understanding verbalized. Left unit via wheelchair with all belongings at side.  

## 2018-05-05 NOTE — Care Management Note (Signed)
Case Management Note  Patient Details  Name: Evelyn ApleyLois Cisneros MRN: [id number] Date of Birth: 05/03/1941  Subjective/Objective: 77 yr old female s/p bilateral L4-5Laminotomy/foraminotomies to decompress the bilateral L4-5nerve roots.           Action/Plan: patient was preoperatively setup with [facility], no changes. No DME needs. Will have support at discharge.     Expected Discharge Date:  05/05/18               Expected Discharge Plan:  Home w Home Health Services  In-House Referral:  NA  Discharge planning Services  CM Consult  Post Acute Care Choice:  Home Health Choice offered to:  Patient  DME Arranged:  N/A DME Agency:  NA  HH Arranged:  PT, Nurse's Aide HH Agency:  [facility]  Status of Service:  Completed, signed off  If discussed at Long Length of Stay Meetings, dates discussed:    Additional Comments:  Durenda GuthrieBrady, Darice Vicario Naomi, RN 05/05/2018, 9:47 AM

## 2018-05-05 NOTE — Discharge Summary (Signed)
Physician Discharge Summary  Patient ID: Evelyn ApleyLois Tostenson MRN: [id number] DOB/AGE: 78/05/1941 77 y.o.  Admit date: 05/04/2018 Discharge date: 05/05/2018  Admission Diagnoses: Lumbar spondylolisthesis, lumbar spinal stenosis with neurogenic claudication, lumbago, lumbar radiculopathy  Discharge Diagnoses: The same Active Problems:   Spondylolisthesis of lumbar region   Discharged Condition: good  Hospital Course: I performed an L4-5 decompression, instrumentation, and fusion of the patient on 05/04/2018.  The surgery went well.  The patient's postoperative course was unremarkable.  On postoperative day #1 she requested discharge home.  Patient, and her husband, were given written and oral discharge instructions.  All their questions were answered.  Consults: Physical therapy Significant Diagnostic Studies: None Treatments: L4-5 decompression, instrumentation, and fusion. Discharge Exam: Blood pressure (!) 110/57, pulse 67, temperature (!) 97.3 F (36.3 C), temperature source Oral, resp. rate 18, height 5\' 1"  (1.549 m), weight 74.8 kg (165 lb), SpO2 100 %. The patient is alert and pleasant.  She looks well.  Her strength is normal.  Disposition: Home  Discharge Instructions    Call MD for:  difficulty breathing, headache or visual disturbances   Complete by:  As directed    Call MD for:  extreme fatigue   Complete by:  As directed    Call MD for:  hives   Complete by:  As directed    Call MD for:  persistant dizziness or light-headedness   Complete by:  As directed    Call MD for:  persistant nausea and vomiting   Complete by:  As directed    Call MD for:  redness, tenderness, or signs of infection (pain, swelling, redness, odor or green/yellow discharge around incision site)   Complete by:  As directed    Call MD for:  severe uncontrolled pain   Complete by:  As directed    Call MD for:  temperature >100.4   Complete by:  As directed    Diet - low sodium heart healthy    Complete by:  As directed    Discharge instructions   Complete by:  As directed    Call [phone] for a followup appointment. Take a stool softener while you are using pain medications.   Driving Restrictions   Complete by:  As directed    Do not drive for 2 weeks.   Increase activity slowly   Complete by:  As directed    Lifting restrictions   Complete by:  As directed    Do not lift more than 5 pounds. No excessive bending or twisting.   May shower / Bathe   Complete by:  As directed    Remove the dressing for 3 days after surgery.  You may shower, but leave the incision alone.   Remove dressing in 48 hours   Complete by:  As directed    Your stitches are under the scan and will dissolve by themselves. The Steri-Strips will fall off after you take a few showers. Do not rub back or pick at the wound, Leave the wound alone.     Allergies as of 05/05/2018      Reactions   Pineapple Hives, Nausea And Vomiting   Pregabalin Other (See Comments)   Pt fell and broke neck, does not want medication   Motrin [ibuprofen] Swelling   Naproxen Swelling   Tizanidine Other (See Comments)   Dizziness      Medication List    STOP taking these medications   ARTHROTEC 75-0.2 MG Tbec Generic drug:  Diclofenac-miSOPROStol  lidocaine 5 % Commonly known as:  LIDODERM   ondansetron 8 MG disintegrating tablet Commonly known as:  ZOFRAN ODT   oxyCODONE-acetaminophen 5-325 MG tablet Commonly known as:  PERCOCET/ROXICET   sucralfate 1 g tablet Commonly known as:  CARAFATE     TAKE these medications   acetaminophen 650 MG CR tablet Commonly known as:  TYLENOL Take 650-1,300 mg by mouth every 8 (eight) hours as needed for pain.   aspirin EC 81 MG tablet Take 81 mg by mouth daily.   atorvastatin 20 MG tablet Commonly known as:  LIPITOR Take 20 mg by mouth every evening.   CoQ10 200 MG Caps Take 200 mg by mouth daily.   docusate sodium 100 MG capsule Commonly known as:   COLACE Take 1 capsule (100 mg total) by mouth 2 (two) times daily.   ferrous sulfate 325 (65 FE) MG tablet Take 325 mg by mouth every 3 (three) days.   gabapentin 300 MG capsule Commonly known as:  NEURONTIN Take 300 mg by mouth 3 (three) times daily.   LASIX 20 MG tablet Generic drug:  furosemide Take 20 mg by mouth daily.   levothyroxine 75 MCG tablet Commonly known as:  SYNTHROID, LEVOTHROID Take 75 mcg by mouth daily before breakfast.   losartan 100 MG tablet Commonly known as:  COZAAR Take 100 mg by mouth every evening.   oxyCODONE 5 MG immediate release tablet Commonly known as:  Oxy IR/ROXICODONE Take 1 tablet (5 mg total) by mouth every 4 (four) hours as needed for moderate pain ((score 4 to 6)).   PARoxetine 10 MG tablet Commonly known as:  PAXIL Take 10 mg by mouth every evening.   PRESERVISION/LUTEIN Caps Take 1 capsule by mouth daily.   traMADol 50 MG tablet Commonly known as:  ULTRAM Take 50 mg by mouth every evening.   VISINE OP Place 2 drops into both eyes every evening.   Vitamin D-3 5000 units Tabs Take 5,000 Units by mouth daily.        Signed: Cristi Loron 05/05/2018, 7:54 AM

## 2018-05-06 MED FILL — Sodium Chloride IV Soln 0.9%: INTRAVENOUS | Qty: 1000 | Status: AC

## 2018-05-06 MED FILL — Heparin Sodium (Porcine) Inj 1000 Unit/ML: INTRAMUSCULAR | Qty: 30 | Status: AC

## 2018-10-05 ENCOUNTER — Encounter: Payer: Self-pay | Admitting: Physical Therapy

## 2018-10-05 ENCOUNTER — Ambulatory Visit: Payer: Medicare Other | Attending: Neurosurgery | Admitting: Physical Therapy

## 2018-10-05 DIAGNOSIS — G8929 Other chronic pain: Secondary | ICD-10-CM | POA: Diagnosis present

## 2018-10-05 DIAGNOSIS — R269 Unspecified abnormalities of gait and mobility: Secondary | ICD-10-CM | POA: Diagnosis present

## 2018-10-05 DIAGNOSIS — M6281 Muscle weakness (generalized): Secondary | ICD-10-CM | POA: Insufficient documentation

## 2018-10-05 DIAGNOSIS — Z981 Arthrodesis status: Secondary | ICD-10-CM | POA: Diagnosis present

## 2018-10-05 DIAGNOSIS — M545 Low back pain: Secondary | ICD-10-CM | POA: Diagnosis present

## 2018-10-09 ENCOUNTER — Encounter: Payer: Self-pay | Admitting: Physical Therapy

## 2018-10-09 NOTE — Therapy (Addendum)
[place] [facility] [address] [place], [place], [address] Phone: [phone]   Fax:  [phone]  Physical Therapy Evaluation  Patient Details  Name: Lawana Hartzell MRN: [id number] Date of Birth: Jan 19, 1941 Referring Provider (PT): Dr. Tressie Stalker   Encounter Date: 10/05/2018  PT End of Session - 10/25/18 1035    Visit Number  1    Number of Visits  8    Date for PT Re-Evaluation  11/02/18    PT Start Time  1044    PT Stop Time  1139    PT Time Calculation (min)  55 min    Equipment Utilized During Treatment  Gait belt    Activity Tolerance  Patient tolerated treatment well    Behavior During Therapy  [facility] for tasks assessed/performed       Past Medical History:  Diagnosis Date  . Anemia   . Arthritis   . Heart murmur   . Hypertension   . Hypothyroidism   . Spondylolisthesis of lumbar region     Past Surgical History:  Procedure Laterality Date  . ABDOMINAL HYSTERECTOMY    . REPLACEMENT TOTAL KNEE BILATERAL    . SPINE SURGERY      There were no vitals filed for this visit.   Subjective Assessment - 10/25/18 1027    Subjective  Pt. is a pleasant 77 y/o female with chronic h/o back and leg pain consistent with neurogenic claudication.  Pt. diagnosed with L4-5 spondylolithesis and severe spinal stenosis resulting in lumbar fusion on 05/04/18    Patient is accompained by:  Family member    Pertinent History  Pt. reports fall 8 years ago causing increase low back pain/ issues.  Pt. s/p L TKA (10 years ago), R TKA (5 years ago).  Pt. has R grasping issues.      Limitations  Sitting;Lifting;Standing;Walking;House hold activities    Diagnostic tests  MRI/ X-rays    Patient Stated Goals  Increase core/ LE muscle strengthening and improve walking/ balance.      Currently in Pain?  No/denies       See flowsheet  Supine TrA ex. Program: instruction in proper muscle activation (handouts page #1-2)  Reviewed stair climbing.       PT Education - 10/25/18 1034    Education Details  See HEP (core progression program)- page #1-2    Person(s) Educated  Patient;Spouse    Methods  Explanation;Demonstration;Handout    Comprehension  Verbalized understanding;Returned demonstration          PT Long Term Goals - 10/25/18 1046      PT LONG TERM GOAL #1   Title  Pt. will improve FOTO to 62 to improve functional mobility.      Baseline  Initial FOTO: 54.     Time  4    Period  Weeks    Status  New    Target Date  11/02/18      PT LONG TERM GOAL #2   Title  Pt. will increase Berg balance test to >50/56 to improve safety/ decrease fall risk with daily tasks.      Baseline  Berg: 46/56.      Time  4    Period  Weeks    Status  New    Target Date  11/02/18      PT LONG TERM GOAL #3   Title  Pt. will increase B LE muscle strength 1/2 muscle grade to improve standing tolerance/ gait pattern.  Baseline  B LE AROM WFL and L LE strength grossly 5/5 MMT except hip flexion 3+/5 MMT. R LE muscle strength grossly 4/5 MMT except hip flexion 3-/5 MMT.    Time  4    Period  Weeks    Status  New    Target Date  11/02/18      PT LONG TERM GOAL #4   Title  Pt. able to ascend/ descend 15 stairs with recip. pattern and use of 1 handrail safely to be able to go to 2nd floor of house.      Baseline  Pt. fearful of stairs and limited with step to pattern/ heavy use of B UE.      Time  4    Period  Weeks    Status  New    Target Date  11/02/18         Plan - 10/25/18 1036    Clinical Impression Statement  Pt. is a pleasant 77 y/o female with chronic h/o low back pain resulting in spinal lumbar fusion.  Pt reports no pain currently but states she has difficulty with LE muscle weakness/ balance issues.  No recent falls reported.  B LE AROM WFL and L LE strength grossly 5/5 MMT except hip flexion 3+/5 MMT.  R LE muscle strength grossly 4/5 MMT except hip flexion 3-/5 MMT.  R hand grasping is limited and pt. using SPC in L  hand with 2-point gait pattern for balance.  FOTO: initial 54/ goal 62.  Berg balance test: 46/56 (fall risk).  Pt. will benefit from skilled PT services to increase core/ LE muscle strengthening to improve walking/ decrease fall risk.      History and Personal Factors relevant to plan of care:  [facility]      Clinical Presentation  Stable    Clinical Decision Making  Low    Rehab Potential  Fair    PT Frequency  2x / week    PT Duration  4 weeks    PT Treatment/Interventions  ADLs/Self Care Home Management;Aquatic Therapy;Cryotherapy;Moist Heat;Gait training;Stair training;Functional mobility training;Neuromuscular re-education;Balance training;Therapeutic exercise;Therapeutic activities;Patient/family education;Manual techniques;Passive range of motion    PT Next Visit Plan  Progress HEP/ balance tasks  (pt. returns after holiday break).     PT Home Exercise Plan  see handouts       Patient will benefit from skilled therapeutic intervention in order to improve the following deficits and impairments:  Abnormal gait, Impaired sensation, Improper body mechanics, Pain, Postural dysfunction, Decreased mobility, Decreased activity tolerance, Decreased endurance, Decreased range of motion, Decreased strength, Hypomobility, Impaired flexibility, Difficulty walking, Decreased balance  Visit Diagnosis: S/P lumbar spinal fusion  Chronic bilateral low back pain, unspecified whether sciatica present  Muscle weakness (generalized)  Gait difficulty     Problem List Patient Active Problem List   Diagnosis Date Noted  . Spondylolisthesis of lumbar region 05/04/2018  . Abnormal glucose level 02/12/2018  . Abnormal weight gain 02/12/2018  . Allergic rhinitis 02/12/2018  . Asthma 02/12/2018  . Bradycardia 02/12/2018  . Edema 02/12/2018  . Heart murmur 02/12/2018  . Hypercalcemia 02/12/2018  . Hypokalemia 02/12/2018  . Pure hypercholesterolemia 02/12/2018  .  Shoulder joint pain 02/12/2018  . Skin sensation disturbance 02/12/2018  . Syncope 02/12/2018  . Vitamin D deficiency 02/12/2018  . 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 Muscle spasticity 04/24/2017  . Weight loss 12/19/2016  . Hypothyroidism 11/24/2016  .  Bilateral leg edema 11/24/2016  . Cervical radiculopathy 11/24/2016  . Disc degeneration, lumbar 11/24/2016  . Essential hypertension 11/24/2016  . Gallstones 11/24/2016  . History of anxiety state 11/24/2016  . Hyperlipidemia, unspecified 11/24/2016  . Spinal stenosis of lumbar region with neurogenic claudication 11/08/2016  . Anxiety 06/08/2015   Cammie McgeeMichael C Micaila Ziemba, PT, DPT # [id number] 10/25/2018, 10:51 AM  [place] [facility] [address] [place], [place], [address] Phone: [phone]   Fax:  [phone]  Name: Sheral ApleyLois Landenberger MRN: [id number] Date of Birth: 03/15/1941

## 2018-10-22 ENCOUNTER — Ambulatory Visit: Payer: Medicare Other | Attending: Neurosurgery | Admitting: Physical Therapy

## 2018-10-22 DIAGNOSIS — Z981 Arthrodesis status: Secondary | ICD-10-CM

## 2018-10-22 DIAGNOSIS — R269 Unspecified abnormalities of gait and mobility: Secondary | ICD-10-CM

## 2018-10-22 DIAGNOSIS — M6281 Muscle weakness (generalized): Secondary | ICD-10-CM | POA: Diagnosis present

## 2018-10-22 DIAGNOSIS — G8929 Other chronic pain: Secondary | ICD-10-CM | POA: Insufficient documentation

## 2018-10-22 DIAGNOSIS — M545 Low back pain, unspecified: Secondary | ICD-10-CM

## 2018-10-23 NOTE — Therapy (Addendum)
[place] [facility] [address] [place], [place], [address] Phone: [phone]   Fax:  [phone]  Physical Therapy Treatment  Patient Details  Name: Evelyn Cisneros MRN: [id number] Date of Birth: 02/05/1941 Referring Provider (PT): Dr. Tressie StalkerJeffrey Jenkins   Encounter Date: 10/22/2018  PT End of Session - 10/25/18 2031    Visit Number  2    Number of Visits  8    Date for PT Re-Evaluation  11/02/18    PT Start Time  1111    PT Stop Time  1201    PT Time Calculation (min)  50 min    Equipment Utilized During Treatment  Gait belt    Activity Tolerance  Patient tolerated treatment well    Behavior During Therapy  [facility] for tasks assessed/performed       Past Medical History:  Diagnosis Date  . Anemia   . Arthritis   . Heart murmur   . Hypertension   . Hypothyroidism   . Spondylolisthesis of lumbar region     Past Surgical History:  Procedure Laterality Date  . ABDOMINAL HYSTERECTOMY    . REPLACEMENT TOTAL KNEE BILATERAL    . SPINE SURGERY      There were no vitals filed for this visit.  Subjective Assessment - 10/25/18 2027    Subjective  Pt. hasn't been seen by PT over past 2 weeks secondary to pt. busy during Christmas/ New Years.  No new complaints over break and pt. states she did core ex. program a few times.      Patient is accompained by:  Family member    Pertinent History  Pt. reports fall 8 years ago causing increase low back pain/ issues.  Pt. s/p L TKA (10 years ago), R TKA (5 years ago).  Pt. has R grasping issues.      Limitations  Sitting;Lifting;Standing;Walking;House hold activities    Diagnostic tests  MRI/ X-rays    Patient Stated Goals  Increase core/ LE muscle strengthening and improve walking/ balance.      Currently in Pain?  No/denies         Treatment:  There.ex.:  Nustep L4 10 min. B UE/LE (no charge)- assist with R hand grasp/ positioning Reviewed core ex. Program (page #1-2)- moderate cuing for  pelvic tilts (proper technique).   Supine hamstring stretches (instructed for HEP).   No additional changes to HEP at this time  Neuro:  Outside walking with/without use of SPC and CGA/min. A for safety Tandem gait with min. To no UE assist Hallway walking (added head turns)- with no UE assist.  High marching in hallway    PT Long Term Goals - 10/25/18 1046      PT LONG TERM GOAL #1   Title  Pt. will improve FOTO to 62 to improve functional mobility.      Baseline  Initial FOTO: 54.     Time  4    Period  Weeks    Status  New    Target Date  11/02/18      PT LONG TERM GOAL #2   Title  Pt. will increase Berg balance test to >50/56 to improve safety/ decrease fall risk with daily tasks.      Baseline  Berg: 46/56.      Time  4    Period  Weeks    Status  New    Target Date  11/02/18      PT LONG TERM GOAL #3  Title  Pt. will increase B LE muscle strength 1/2 muscle grade to improve standing tolerance/ gait pattern.      Baseline  B LE AROM WFL and L LE strength grossly 5/5 MMT except hip flexion 3+/5 MMT. R LE muscle strength grossly 4/5 MMT except hip flexion 3-/5 MMT.    Time  4    Period  Weeks    Status  New    Target Date  11/02/18      PT LONG TERM GOAL #4   Title  Pt. able to ascend/ descend 15 stairs with recip. pattern and use of 1 handrail safely to be able to go to 2nd floor of house.      Baseline  Pt. fearful of stairs and limited with step to pattern/ heavy use of B UE.      Time  4    Period  Weeks    Status  New    Target Date  11/02/18         Plan - 10/25/18 2032    Clinical Impression Statement  Pt. ambulates with slow but controlled gait pattern during PT session.  Pt. benefits from use of SPC/ light UE assist to decrease fall risk.  Moderate cuing to instruct pt. in proper core muscle activation/ pelvic tilts in supine position.  Pt. requires light UE assist during tandem/ balance tasks in hallway.      Clinical Presentation  Stable    Clinical  Decision Making  Low    Rehab Potential  Fair    PT Frequency  2x / week    PT Duration  4 weeks    PT Treatment/Interventions  ADLs/Self Care Home Management;Aquatic Therapy;Cryotherapy;Moist Heat;Gait training;Stair training;Functional mobility training;Neuromuscular re-education;Balance training;Therapeutic exercise;Therapeutic activities;Patient/family education;Manual techniques;Passive range of motion    PT Next Visit Plan  Progress HEP/ balance tasks      PT Home Exercise Plan  see handouts       Patient will benefit from skilled therapeutic intervention in order to improve the following deficits and impairments:  Abnormal gait, Impaired sensation, Improper body mechanics, Pain, Postural dysfunction, Decreased mobility, Decreased activity tolerance, Decreased endurance, Decreased range of motion, Decreased strength, Hypomobility, Impaired flexibility, Difficulty walking, Decreased balance  Visit Diagnosis: S/P lumbar spinal fusion  Chronic bilateral low back pain, unspecified whether sciatica present  Muscle weakness (generalized)  Gait difficulty     Problem List Patient Active Problem List   Diagnosis Date Noted  . Spondylolisthesis of lumbar region 05/04/2018  . Abnormal glucose level 02/12/2018  . Abnormal weight gain 02/12/2018  . Allergic rhinitis 02/12/2018  . Asthma 02/12/2018  . Bradycardia 02/12/2018  . Edema 02/12/2018  . Heart murmur 02/12/2018  . Hypercalcemia 02/12/2018  . Hypokalemia 02/12/2018  . Pure hypercholesterolemia 02/12/2018  . Shoulder joint pain 02/12/2018  . Skin sensation disturbance 02/12/2018  . Syncope 02/12/2018  . Vitamin D deficiency 02/12/2018  . 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 Muscle spasticity 04/24/2017  . Weight loss 12/19/2016  . Hypothyroidism 11/24/2016  . Bilateral leg edema 11/24/2016  . Cervical radiculopathy 11/24/2016  . Disc degeneration, lumbar 11/24/2016  .  Essential hypertension 11/24/2016  . Gallstones 11/24/2016  . History of anxiety state 11/24/2016  . Hyperlipidemia, unspecified 11/24/2016  . Spinal stenosis of lumbar region with neurogenic claudication 11/08/2016  . Anxiety 06/08/2015   Cammie Mcgee, PT, DPT # [id number] 10/25/2018, 8:36 PM  [place] [facility]  [address] [place], [place], [address] Phone: [phone]   Fax:  [phone]  Name: Evelyn Cisneros MRN: [id number] Date of Birth: 03/18/1941

## 2018-10-25 NOTE — Addendum Note (Signed)
Addended by: Cammie Mcgee on: 10/25/2018 10:54 AM   Modules accepted: Orders

## 2018-10-26 ENCOUNTER — Ambulatory Visit: Payer: Medicare Other | Admitting: Physical Therapy

## 2018-10-26 DIAGNOSIS — M6281 Muscle weakness (generalized): Secondary | ICD-10-CM

## 2018-10-26 DIAGNOSIS — Z981 Arthrodesis status: Secondary | ICD-10-CM

## 2018-10-26 DIAGNOSIS — M545 Low back pain, unspecified: Secondary | ICD-10-CM

## 2018-10-26 DIAGNOSIS — R269 Unspecified abnormalities of gait and mobility: Secondary | ICD-10-CM

## 2018-10-26 DIAGNOSIS — G8929 Other chronic pain: Secondary | ICD-10-CM

## 2018-10-27 ENCOUNTER — Encounter: Payer: Medicare Other | Admitting: Physical Therapy

## 2018-10-29 ENCOUNTER — Ambulatory Visit: Payer: Medicare Other | Admitting: Physical Therapy

## 2018-10-29 DIAGNOSIS — Z981 Arthrodesis status: Secondary | ICD-10-CM

## 2018-10-29 DIAGNOSIS — M545 Low back pain: Secondary | ICD-10-CM

## 2018-10-29 DIAGNOSIS — M6281 Muscle weakness (generalized): Secondary | ICD-10-CM

## 2018-10-29 DIAGNOSIS — G8929 Other chronic pain: Secondary | ICD-10-CM

## 2018-10-29 DIAGNOSIS — R269 Unspecified abnormalities of gait and mobility: Secondary | ICD-10-CM

## 2018-10-31 ENCOUNTER — Encounter: Payer: Self-pay | Admitting: Physical Therapy

## 2018-10-31 NOTE — Therapy (Addendum)
[facility] [address] [place], [place], [address] Phone: [phone]   Fax:  [phone]  Physical Therapy Treatment  Patient Details  Name: Evelyn Cisneros MRN: [id number] Date of Birth: Mar 18, 1941 Referring Provider (PT): Dr. Tressie Stalker   Encounter Date: 10/29/2018  PT End of Session - 11/05/18 1031    Visit Number  4    Number of Visits  8    Date for PT Re-Evaluation  11/02/18    PT Start Time  1112    PT Stop Time  1205    PT Time Calculation (min)  53 min    Equipment Utilized During Treatment  Gait belt    Activity Tolerance  Patient tolerated treatment well    Behavior During Therapy  [facility] for tasks assessed/performed       Past Medical History:  Diagnosis Date  . Anemia   . Arthritis   . Heart murmur   . Hypertension   . Hypothyroidism   . Spondylolisthesis of lumbar region     Past Surgical History:  Procedure Laterality Date  . ABDOMINAL HYSTERECTOMY    . REPLACEMENT TOTAL KNEE BILATERAL    . SPINE SURGERY      There were no vitals filed for this visit.  Subjective Assessment - 11/05/18 1029    Subjective  Pt. reports soreness/ tenderness in R thigh/ knee with light palpation.  Pt. will occasional take pain meds. to manage pain in R LE, esp. with PT tx.    Patient is accompained by:  Family member    Pertinent History  Pt. reports fall 8 years ago causing increase low back pain/ issues.  Pt. s/p L TKA (10 years ago), R TKA (5 years ago).  Pt. has R grasping issues.      Limitations  Sitting;Lifting;Standing;Walking;House hold activities    Diagnostic tests  MRI/ X-rays    Patient Stated Goals  Increase core/ LE muscle strengthening and improve walking/ balance.      Currently in Pain?  No/denies       Treatment:  There.ex.:  Nustep L4 10 min. B UE/LE (no charge)- assist with R hand grasp/ positioning Core stability ex. In supine (see handouts). Supine hamstring/ ITB/ quad. Stretches Supine  alt. UE/LE (deadbug)- 10x2.    Neuro:  //-bar walking: forward/ backwards/ lateral with min. To no UE assist working on hip flexion/ step pattern. Tandem gait with min. To no UE assist //-bars: braiding L/R, Tandem stance and gait. Cone taps/ stairs with light UE assist      PT Long Term Goals - 10/25/18 1046      PT LONG TERM GOAL #1   Title  Pt. will improve FOTO to 62 to improve functional mobility.      Baseline  Initial FOTO: 54.     Time  4    Period  Weeks    Status  New    Target Date  11/02/18      PT LONG TERM GOAL #2   Title  Pt. will increase Berg balance test to >50/56 to improve safety/ decrease fall risk with daily tasks.      Baseline  Berg: 46/56.      Time  4    Period  Weeks    Status  New    Target Date  11/02/18      PT LONG TERM GOAL #3   Title  Pt. will increase B LE muscle strength 1/2 muscle grade to  improve standing tolerance/ gait pattern.      Baseline  B LE AROM WFL and L LE strength grossly 5/5 MMT except hip flexion 3+/5 MMT. R LE muscle strength grossly 4/5 MMT except hip flexion 3-/5 MMT.    Time  4    Period  Weeks    Status  New    Target Date  11/02/18      PT LONG TERM GOAL #4   Title  Pt. able to ascend/ descend 15 stairs with recip. pattern and use of 1 handrail safely to be able to go to 2nd floor of house.      Baseline  Pt. fearful of stairs and limited with step to pattern/ heavy use of B UE.      Time  4    Period  Weeks    Status  New    Target Date  11/02/18         Plan - 11/05/18 1032    Clinical Impression Statement  Pt. walking with consistent gait pattern/ increase cadence in PT clinic with use of SPC.  Pt. understands importance of core muscle activiation/ contraction with position changes.  PT will work on pts. R LE ROM/ strengthening next tx. session (pt. may take pain meds. secondary to tenderness/pain).     Clinical Presentation  Stable    Clinical Decision Making  Low    Rehab Potential  Fair    PT  Frequency  2x / week    PT Duration  4 weeks    PT Treatment/Interventions  ADLs/Self Care Home Management;Aquatic Therapy;Cryotherapy;Moist Heat;Gait training;Stair training;Functional mobility training;Neuromuscular re-education;Balance training;Therapeutic exercise;Therapeutic activities;Patient/family education;Manual techniques;Passive range of motion    PT Next Visit Plan  Progress HEP/ balance tasks      PT Home Exercise Plan  see handouts       Patient will benefit from skilled therapeutic intervention in order to improve the following deficits and impairments:  Abnormal gait, Impaired sensation, Improper body mechanics, Pain, Postural dysfunction, Decreased mobility, Decreased activity tolerance, Decreased endurance, Decreased range of motion, Decreased strength, Hypomobility, Impaired flexibility, Difficulty walking, Decreased balance  Visit Diagnosis: S/P lumbar spinal fusion  Chronic low back pain, unspecified back pain laterality, unspecified whether sciatica present  Muscle weakness (generalized)  Gait difficulty     Problem List Patient Active Problem List   Diagnosis Date Noted  . Spondylolisthesis of lumbar region 05/04/2018  . Abnormal glucose level 02/12/2018  . Abnormal weight gain 02/12/2018  . Allergic rhinitis 02/12/2018  . Asthma 02/12/2018  . Bradycardia 02/12/2018  . Edema 02/12/2018  . Heart murmur 02/12/2018  . Hypercalcemia 02/12/2018  . Hypokalemia 02/12/2018  . Pure hypercholesterolemia 02/12/2018  . Shoulder joint pain 02/12/2018  . Skin sensation disturbance 02/12/2018  . Syncope 02/12/2018  . Vitamin D deficiency 02/12/2018  . 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 Muscle spasticity 04/24/2017  . Weight loss 12/19/2016  . Hypothyroidism 11/24/2016  . Bilateral leg edema 11/24/2016  . Cervical radiculopathy 11/24/2016  . Disc degeneration, lumbar 11/24/2016  . Essential hypertension 11/24/2016   . Gallstones 11/24/2016  . History of anxiety state 11/24/2016  . Hyperlipidemia, unspecified 11/24/2016  . Spinal stenosis of lumbar region with neurogenic claudication 11/08/2016  . Anxiety 06/08/2015   Cammie McgeeMichael C Ransom Nickson, PT, DPT # [id number] 11/05/2018, 10:37 AM  [facility] [address] [place], [place], [address] Phone: [phone]   Fax:  [phone]  Name:  Evelyn Cisneros MRN: [id number] Date of Birth: 1941/08/28

## 2018-10-31 NOTE — Therapy (Addendum)
[place] [facility] [address] [place], [place], [address] Phone: [phone]   Fax:  [phone]  Physical Therapy Treatment  Patient Details  Name: Evelyn ApleyLois Cisneros MRN: [id number] Date of Birth: 05/06/1941 Referring Provider (PT): Dr. Tressie StalkerJeffrey Jenkins   Encounter Date: 10/26/2018  PT End of Session - 11/03/18 1053    Visit Number  3    Number of Visits  8    Date for PT Re-Evaluation  11/02/18    PT Start Time  1111    PT Stop Time  1203    PT Time Calculation (min)  52 min    Equipment Utilized During Treatment  Gait belt    Activity Tolerance  Patient tolerated treatment well    Behavior During Therapy  [facility] for tasks assessed/performed       Past Medical History:  Diagnosis Date  . Anemia   . Arthritis   . Heart murmur   . Hypertension   . Hypothyroidism   . Spondylolisthesis of lumbar region     Past Surgical History:  Procedure Laterality Date  . ABDOMINAL HYSTERECTOMY    . REPLACEMENT TOTAL KNEE BILATERAL    . SPINE SURGERY      There were no vitals filed for this visit.  Subjective Assessment - 11/03/18 1052    Subjective  Pt. states she is doing alright.  No new complaints.      Patient is accompained by:  Family member    Pertinent History  Pt. reports fall 8 years ago causing increase low back pain/ issues.  Pt. s/p L TKA (10 years ago), R TKA (5 years ago).  Pt. has R grasping issues.      Limitations  Sitting;Lifting;Standing;Walking;House hold activities    Diagnostic tests  MRI/ X-rays    Patient Stated Goals  Increase core/ LE muscle strengthening and improve walking/ balance.      Currently in Pain?  No/denies       Treatment:  There.ex.:  Nustep L4 10 min. B UE/LE (no charge)- assist with R hand grasp/ positioning Supine hamstring/ ITB/ quad. Stretches Supine hip adduction with ball 10x with added SAQ. Sit to stands from green chair with proper technique 10x.  Neuro:  Outside walking  with/without use of SPC and CGA/min. A for safety Tandem gait with min. To no UE assist //-bars: braiding L/R, Tandem stance and gait.   Turning 360 deg. CW/CCW    PT Long Term Goals - 10/25/18 1046      PT LONG TERM GOAL #1   Title  Pt. will improve FOTO to 62 to improve functional mobility.      Baseline  Initial FOTO: 54.     Time  4    Period  Weeks    Status  New    Target Date  11/02/18      PT LONG TERM GOAL #2   Title  Pt. will increase Berg balance test to >50/56 to improve safety/ decrease fall risk with daily tasks.      Baseline  Berg: 46/56.      Time  4    Period  Weeks    Status  New    Target Date  11/02/18      PT LONG TERM GOAL #3   Title  Pt. will increase B LE muscle strength 1/2 muscle grade to improve standing tolerance/ gait pattern.      Baseline  B LE AROM WFL and L LE strength  grossly 5/5 MMT except hip flexion 3+/5 MMT. R LE muscle strength grossly 4/5 MMT except hip flexion 3-/5 MMT.    Time  4    Period  Weeks    Status  New    Target Date  11/02/18      PT LONG TERM GOAL #4   Title  Pt. able to ascend/ descend 15 stairs with recip. pattern and use of 1 handrail safely to be able to go to 2nd floor of house.      Baseline  Pt. fearful of stairs and limited with step to pattern/ heavy use of B UE.      Time  4    Period  Weeks    Status  New    Target Date  11/02/18         Plan - 11/03/18 1054    Clinical Impression Statement  Pt. trying to progress pts. independence/ cadence in PT clinic with consistent recip. pattern and use of SPC.  Pt. requires cuing to maintain proper posture/ core muscle activation with standing ther.ex./ mobility.  Pt. demonstrates improved core stability during tx. session.  No LOB during tx. session.      Clinical Presentation  Stable    Clinical Decision Making  Low    Rehab Potential  Fair    PT Frequency  2x / week    PT Duration  4 weeks    PT Treatment/Interventions  ADLs/Self Care Home Management;Aquatic  Therapy;Cryotherapy;Moist Heat;Gait training;Stair training;Functional mobility training;Neuromuscular re-education;Balance training;Therapeutic exercise;Therapeutic activities;Patient/family education;Manual techniques;Passive range of motion    PT Next Visit Plan  Progress HEP/ balance tasks      PT Home Exercise Plan  see handouts       Patient will benefit from skilled therapeutic intervention in order to improve the following deficits and impairments:  Abnormal gait, Impaired sensation, Improper body mechanics, Pain, Postural dysfunction, Decreased mobility, Decreased activity tolerance, Decreased endurance, Decreased range of motion, Decreased strength, Hypomobility, Impaired flexibility, Difficulty walking, Decreased balance  Visit Diagnosis: S/P lumbar spinal fusion  Chronic low back pain, unspecified back pain laterality, unspecified whether sciatica present  Muscle weakness (generalized)  Gait difficulty     Problem List Patient Active Problem List   Diagnosis Date Noted  . Spondylolisthesis of lumbar region 05/04/2018  . Abnormal glucose level 02/12/2018  . Abnormal weight gain 02/12/2018  . Allergic rhinitis 02/12/2018  . Asthma 02/12/2018  . Bradycardia 02/12/2018  . Edema 02/12/2018  . Heart murmur 02/12/2018  . Hypercalcemia 02/12/2018  . Hypokalemia 02/12/2018  . Pure hypercholesterolemia 02/12/2018  . Shoulder joint pain 02/12/2018  . Skin sensation disturbance 02/12/2018  . Syncope 02/12/2018  . Vitamin D deficiency 02/12/2018  . 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 Muscle spasticity 04/24/2017  . Weight loss 12/19/2016  . Hypothyroidism 11/24/2016  . Bilateral leg edema 11/24/2016  . Cervical radiculopathy 11/24/2016  . Disc degeneration, lumbar 11/24/2016  . Essential hypertension 11/24/2016  . Gallstones 11/24/2016  . History of anxiety state 11/24/2016  . Hyperlipidemia, unspecified 11/24/2016  .  Spinal stenosis of lumbar region with neurogenic claudication 11/08/2016  . Anxiety 06/08/2015   Cammie McgeeMichael C Myan Locatelli, PT, DPT # [phone] 11/03/2018, 11:01 AM  [place] [facility] [address] [place], [place], [address] Phone: [phone]   Fax:  [phone]  Name: Evelyn ApleyLois Cancio MRN: [id number] Date of Birth: 04/26/1941

## 2018-11-03 ENCOUNTER — Ambulatory Visit: Payer: Medicare Other | Admitting: Physical Therapy

## 2018-11-03 DIAGNOSIS — Z981 Arthrodesis status: Secondary | ICD-10-CM

## 2018-11-03 DIAGNOSIS — M6281 Muscle weakness (generalized): Secondary | ICD-10-CM

## 2018-11-03 DIAGNOSIS — M545 Low back pain, unspecified: Secondary | ICD-10-CM

## 2018-11-03 DIAGNOSIS — G8929 Other chronic pain: Secondary | ICD-10-CM

## 2018-11-03 DIAGNOSIS — R269 Unspecified abnormalities of gait and mobility: Secondary | ICD-10-CM

## 2018-11-05 ENCOUNTER — Ambulatory Visit: Payer: Medicare Other | Admitting: Physical Therapy

## 2018-11-05 ENCOUNTER — Encounter: Payer: Self-pay | Admitting: Physical Therapy

## 2018-11-05 DIAGNOSIS — M545 Low back pain, unspecified: Secondary | ICD-10-CM

## 2018-11-05 DIAGNOSIS — M6281 Muscle weakness (generalized): Secondary | ICD-10-CM

## 2018-11-05 DIAGNOSIS — G8929 Other chronic pain: Secondary | ICD-10-CM

## 2018-11-05 DIAGNOSIS — Z981 Arthrodesis status: Secondary | ICD-10-CM | POA: Diagnosis not present

## 2018-11-05 DIAGNOSIS — R269 Unspecified abnormalities of gait and mobility: Secondary | ICD-10-CM

## 2018-11-05 NOTE — Patient Instructions (Signed)
Access Code: [email or web address]  URL: [email or web address]  Date: 11/05/2018  Prepared by: Dorene Grebe   Exercises  Hooklying Clamshell with Resistance - 10 reps - 3 sets - 1x daily - 7x weekly  Supine Hip Adduction Isometric with Ball - 10 reps - 3 sets - 1x daily - 7x weekly  Dead Bug - 10 reps - 3 sets - 1x daily - 7x weekly

## 2018-11-07 NOTE — Therapy (Addendum)
[place] [facility] [address] [place], [place], [address] Phone: [phone]   Fax:  [phone]  Physical Therapy Treatment  Patient Details  Name: Evelyn Cisneros MRN: [id number] Date of Birth: Feb 25, 1941 Referring Provider (PT): Dr. Newman Pies   Encounter Date: 11/03/2018  PT End of Session - 11/09/18 0943    Visit Number  5    Number of Visits  13    Date for PT Re-Evaluation  12/01/18    PT Start Time  1110    PT Stop Time  1202    PT Time Calculation (min)  52 min    Equipment Utilized During Treatment  Gait belt    Activity Tolerance  Patient tolerated treatment well    Behavior During Therapy  [facility] for tasks assessed/performed       Past Medical History:  Diagnosis Date  . Anemia   . Arthritis   . Heart murmur   . Hypertension   . Hypothyroidism   . Spondylolisthesis of lumbar region     Past Surgical History:  Procedure Laterality Date  . ABDOMINAL HYSTERECTOMY    . REPLACEMENT TOTAL KNEE BILATERAL    . SPINE SURGERY      There were no vitals filed for this visit.      [facility] PT Assessment - 11/09/18 0001      Assessment   Medical Diagnosis  Spondylolisthesis, lumbar region and s/p lumbar fusion    Referring Provider (PT)  Dr. Newman Pies    Onset Date/Surgical Date  05/04/18    Prior Therapy  yes, see notes at [facility]      Precautions   Precautions  None      Restrictions   Weight Bearing Restrictions  No      Balance Screen   Has the patient fallen in the past 6 months  No      [place] residence      Prior Function   Level of Independence  Independent    Vocation  Retired        Pt. did a lot of walking yesterday and did well. No new compliants reported.      Treatment:  There.ex.:  Nustep L5 10 min. B UE/LE (no charge)- consistent R hand grasp Core stability ex. In supine (see handouts). Supine alt. UE/LE (deadbug)- 10x2.    Supine ball hip adduction 20x with holds and added SAQ 20x Reviewed HEP  Neuro:  //-bar walking: forward/ backwards/ lateral with min. To no UE assist working on hip flexion/ step pattern. Tandem gait/ braiding/ STS with min. To no UE assist Cone taps/ stairs with light UE assist  Manual tx.:  Supine LE stretches (ITB/ hamstring/ gastroc/ trunk rotn./ piriformis)- as tolerated    PT Long Term Goals - 11/09/18 0948      PT LONG TERM GOAL #1   Title  Pt. will improve FOTO to 62 to improve functional mobility.      Baseline  Initial FOTO: 54.     Time  4    Period  Weeks    Status  On-going    Target Date  12/01/18      PT LONG TERM GOAL #2   Title  Pt. will increase Berg balance test to >50/56 to improve safety/ decrease fall risk with daily tasks.      Baseline  Berg: 46/56.      Time  4  Period  Weeks    Status  On-going    Target Date  12/01/18      PT LONG TERM GOAL #3   Title  Pt. will increase B LE muscle strength 1/2 muscle grade to improve standing tolerance/ gait pattern.      Baseline  B LE AROM WFL and L LE strength grossly 5/5 MMT except hip flexion 3+/5 MMT. R LE muscle strength grossly 4/5 MMT except hip flexion 3-/5 MMT.    Time  4    Period  Weeks    Status  Not Met    Target Date  12/01/18      PT LONG TERM GOAL #4   Title  Pt. able to ascend/ descend 15 stairs with recip. pattern and use of 1 handrail safely to be able to go to 2nd floor of house.      Baseline  Pt. able to ascend stair with recip. pattern but descends with step to pattern for safety.      Time  4    Period  Weeks    Status  Partially Met    Target Date  12/01/18            Plan - 11/09/18 0945    Clinical Impression Statement  Pt. continues to report no pain but states she has difficulty with LE muscle weakness/ balance issues.  No recent falls or LOB reported.  B LE AROM WFL and L LE strength grossly 5/5 MMT except hip flexion 3+/5 MMT. R LE muscle strength grossly  4/5 MMT except hip flexion 3-/5 MMT.  R hand grasping is limited and pt. using SPC in L hand with 2-point gait pattern for balance. See updated goals.  Pt. has progressed well with core based/ LE strengthening ex. program.  Moderate R LE tenderness with light palpation (generalized but esp. in quad./ ITB).  Pt. will continue to benefit from skilled PT services to increase core/ LE muscle strengthening to improve walking/ decrease fall risk.     Clinical Presentation  Stable    Clinical Decision Making  Low    Rehab Potential  Fair    PT Frequency  2x / week    PT Duration  4 weeks    PT Treatment/Interventions  ADLs/Self Care Home Management;Aquatic Therapy;Cryotherapy;Moist Heat;Gait training;Stair training;Functional mobility training;Neuromuscular re-education;Balance training;Therapeutic exercise;Therapeutic activities;Patient/family education;Manual techniques;Passive range of motion    PT Next Visit Plan  Progress HEP/ balance tasks      PT Home Exercise Plan  see handouts       Patient will benefit from skilled therapeutic intervention in order to improve the following deficits and impairments:  Abnormal gait, Impaired sensation, Improper body mechanics, Pain, Postural dysfunction, Decreased mobility, Decreased activity tolerance, Decreased endurance, Decreased range of motion, Decreased strength, Hypomobility, Impaired flexibility, Difficulty walking, Decreased balance  Visit Diagnosis: S/P lumbar spinal fusion  Chronic low back pain, unspecified back pain laterality, unspecified whether sciatica present  Muscle weakness (generalized)  Gait difficulty     Problem List Patient Active Problem List   Diagnosis Date Noted  . Spondylolisthesis of lumbar region 05/04/2018  . Abnormal glucose level 02/12/2018  . Abnormal weight gain 02/12/2018  . Allergic rhinitis 02/12/2018  . Asthma 02/12/2018  . Bradycardia 02/12/2018  . Edema 02/12/2018  . Heart murmur 02/12/2018  .  Hypercalcemia 02/12/2018  . Hypokalemia 02/12/2018  . Pure hypercholesterolemia 02/12/2018  . Shoulder joint pain 02/12/2018  . Skin sensation disturbance 02/12/2018  . Syncope 02/12/2018  .  Vitamin D deficiency 02/12/2018  . 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 Muscle spasticity 04/24/2017  . Weight loss 12/19/2016  . Hypothyroidism 11/24/2016  . Bilateral leg edema 11/24/2016  . Cervical radiculopathy 11/24/2016  . Disc degeneration, lumbar 11/24/2016  . Essential hypertension 11/24/2016  . Gallstones 11/24/2016  . History of anxiety state 11/24/2016  . Hyperlipidemia, unspecified 11/24/2016  . Spinal stenosis of lumbar region with neurogenic claudication 11/08/2016  . Anxiety 06/08/2015   Pura Spice, PT, DPT # [phone] 11/09/2018, 9:51 AM  [place] [facility] [address] [place], [place], [address] Phone: [phone]   Fax:  [phone]  Name: Evelyn Cisneros MRN: [id number] Date of Birth: 30-Dec-1940

## 2018-11-07 NOTE — Therapy (Addendum)
[place] [facility] [address] [place], [place], [address] Phone: [phone]   Fax:  [phone]  Physical Therapy Treatment  Patient Details  Name: Evelyn Cisneros MRN: [id number] Date of Birth: 1941-01-20 Referring Provider (PT): Dr. Newman Pies   Encounter Date: 11/05/2018  PT End of Session - 11/11/18 0835    Visit Number  6    Number of Visits  13    Date for PT Re-Evaluation  12/01/18    PT Start Time  1107    PT Stop Time  1201    PT Time Calculation (min)  54 min    Equipment Utilized During Treatment  Gait belt    Activity Tolerance  Patient tolerated treatment well    Behavior During Therapy  [facility] for tasks assessed/performed       Past Medical History:  Diagnosis Date  . Anemia   . Arthritis   . Heart murmur   . Hypertension   . Hypothyroidism   . Spondylolisthesis of lumbar region     Past Surgical History:  Procedure Laterality Date  . ABDOMINAL HYSTERECTOMY    . REPLACEMENT TOTAL KNEE BILATERAL    . SPINE SURGERY      There were no vitals filed for this visit.    Pt. states she was active yesterday at [facility] and will household tasks. No pain but discomfort in R LE with palpation.       Treatment:  There.ex.:  Nustep L5 10 min. B UE/LE (no charge/ warm-up). Core stability ex. In supine.  Progression with reps. Supine alt. UE/LE (deadbug)- 10x2. Supine ball hip adduction 20x with holds and added SAQ 20x See new HEP (handouts issued).    Neuro:  //-bar walking: forward/ backwards/ lateral with min. To no UE assist working on hip flexion/ step pattern. Tandem gait/ braiding/ STS with min. To no UE assist High marching with no assistive device in hallway.  Head turns in hallway (letters/ numbers).  Lateral walking with correction of posture in hallway.      PT Education - 11/11/18 0834    Education Details  See HEP    Person(s) Educated  Patient;Spouse    Methods   Explanation;Demonstration;Handout    Comprehension  Verbalized understanding;Returned demonstration          PT Long Term Goals - 11/09/18 0948      PT LONG TERM GOAL #1   Title  Pt. will improve FOTO to 62 to improve functional mobility.      Baseline  Initial FOTO: 54.     Time  4    Period  Weeks    Status  On-going    Target Date  12/01/18      PT LONG TERM GOAL #2   Title  Pt. will increase Berg balance test to >50/56 to improve safety/ decrease fall risk with daily tasks.      Baseline  Berg: 46/56.      Time  4    Period  Weeks    Status  On-going    Target Date  12/01/18      PT LONG TERM GOAL #3   Title  Pt. will increase B LE muscle strength 1/2 muscle grade to improve standing tolerance/ gait pattern.      Baseline  B LE AROM WFL and L LE strength grossly 5/5 MMT except hip flexion 3+/5 MMT. R LE muscle strength grossly 4/5 MMT except hip flexion 3-/5 MMT.  Time  4    Period  Weeks    Status  Not Met    Target Date  12/01/18      PT LONG TERM GOAL #4   Title  Pt. able to ascend/ descend 15 stairs with recip. pattern and use of 1 handrail safely to be able to go to 2nd floor of house.      Baseline  Pt. able to ascend stair with recip. pattern but descends with step to pattern for safety.      Time  4    Period  Weeks    Status  Partially Met    Target Date  12/01/18         Plan - 11/11/18 0835    Clinical Impression Statement  PT has to frequently cue pt. to slow down with gait/ balance tasks in hallway.  No LOB in hallway but difficulty with step pattern with increase hip flexion without assistive device.  Good core ex. progression with supine/ seated ther.ex.  See new HEP at this time.      Clinical Presentation  Stable    Clinical Decision Making  Low    Rehab Potential  Fair    PT Frequency  2x / week    PT Duration  4 weeks    PT Treatment/Interventions  ADLs/Self Care Home Management;Aquatic Therapy;Cryotherapy;Moist Heat;Gait training;Stair  training;Functional mobility training;Neuromuscular re-education;Balance training;Therapeutic exercise;Therapeutic activities;Patient/family education;Manual techniques;Passive range of motion    PT Next Visit Plan  Progress HEP/ balance tasks      PT Home Exercise Plan  see handouts       Patient will benefit from skilled therapeutic intervention in order to improve the following deficits and impairments:  Abnormal gait, Impaired sensation, Improper body mechanics, Pain, Postural dysfunction, Decreased mobility, Decreased activity tolerance, Decreased endurance, Decreased range of motion, Decreased strength, Hypomobility, Impaired flexibility, Difficulty walking, Decreased balance  Visit Diagnosis: S/P lumbar spinal fusion  Chronic low back pain, unspecified back pain laterality, unspecified whether sciatica present  Muscle weakness (generalized)  Gait difficulty     Problem List Patient Active Problem List   Diagnosis Date Noted  . Spondylolisthesis of lumbar region 05/04/2018  . Abnormal glucose level 02/12/2018  . Abnormal weight gain 02/12/2018  . Allergic rhinitis 02/12/2018  . Asthma 02/12/2018  . Bradycardia 02/12/2018  . Edema 02/12/2018  . Heart murmur 02/12/2018  . Hypercalcemia 02/12/2018  . Hypokalemia 02/12/2018  . Pure hypercholesterolemia 02/12/2018  . Shoulder joint pain 02/12/2018  . Skin sensation disturbance 02/12/2018  . Syncope 02/12/2018  . Vitamin D deficiency 02/12/2018  . 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 Muscle spasticity 04/24/2017  . Weight loss 12/19/2016  . Hypothyroidism 11/24/2016  . Bilateral leg edema 11/24/2016  . Cervical radiculopathy 11/24/2016  . Disc degeneration, lumbar 11/24/2016  . Essential hypertension 11/24/2016  . Gallstones 11/24/2016  . History of anxiety state 11/24/2016  . Hyperlipidemia, unspecified 11/24/2016  . Spinal stenosis of lumbar region with neurogenic  claudication 11/08/2016  . Anxiety 06/08/2015   Pura Spice, PT, DPT # [phone] 11/11/2018, 8:43 AM  [place] [facility] [address] [place], [place], [address] Phone: [phone]   Fax:  [phone]  Name: Evelyn Cisneros MRN: [id number] Date of Birth: 03-24-1941

## 2018-11-09 ENCOUNTER — Encounter: Payer: Self-pay | Admitting: Physical Therapy

## 2018-11-09 ENCOUNTER — Ambulatory Visit: Payer: Medicare Other | Admitting: Physical Therapy

## 2018-11-09 DIAGNOSIS — Z981 Arthrodesis status: Secondary | ICD-10-CM

## 2018-11-09 DIAGNOSIS — G8929 Other chronic pain: Secondary | ICD-10-CM

## 2018-11-09 DIAGNOSIS — M545 Low back pain, unspecified: Secondary | ICD-10-CM

## 2018-11-09 DIAGNOSIS — M6281 Muscle weakness (generalized): Secondary | ICD-10-CM

## 2018-11-09 DIAGNOSIS — R269 Unspecified abnormalities of gait and mobility: Secondary | ICD-10-CM

## 2018-11-09 NOTE — Addendum Note (Signed)
Addended by: Cammie Mcgee on: 11/09/2018 09:58 AM   Modules accepted: Orders

## 2018-11-12 NOTE — Therapy (Signed)
[place] [facility] [address] [place], [place], [address] Phone: [phone]   Fax:  [phone]  Physical Therapy Treatment  Patient Details  Name: Evelyn Cisneros MRN: [id number] Date of Birth: December 08, 1940 Referring Provider (PT): Dr. Newman Pies   Encounter Date: 11/09/2018  PT End of Session - 11/12/18 1402    Visit Number  7    Number of Visits  13    Date for PT Re-Evaluation  12/01/18    PT Start Time  1105    PT Stop Time  1158    PT Time Calculation (min)  53 min    Equipment Utilized During Treatment  Gait belt    Activity Tolerance  Patient tolerated treatment well    Behavior During Therapy  [facility] for tasks assessed/performed       Past Medical History:  Diagnosis Date  . Anemia   . Arthritis   . Heart murmur   . Hypertension   . Hypothyroidism   . Spondylolisthesis of lumbar region     Past Surgical History:  Procedure Laterality Date  . ABDOMINAL HYSTERECTOMY    . REPLACEMENT TOTAL KNEE BILATERAL    . SPINE SURGERY      There were no vitals filed for this visit.  Subjective Assessment - 11/12/18 1317    Subjective  Pt. states she completed HEP as prescibed and remains active with daily walking at home/ [place] over weekend.  No c/o pain at this time.      Patient is accompained by:  Family member    Pertinent History  Pt. reports fall 8 years ago causing increase low back pain/ issues.  Pt. s/p L TKA (10 years ago), R TKA (5 years ago).  Pt. has R grasping issues.      Limitations  Sitting;Lifting;Standing;Walking;House hold activities    Diagnostic tests  MRI/ X-rays    Patient Stated Goals  Increase core/ LE muscle strengthening and improve walking/ balance.      Currently in Pain?  No/denies       Treatment:  There.ex.:  Nustep L510 min. B UE/LE (no charge/ warm-up). Seated/ supine alt. UE and LE ex. With core muscle activation (mirror feedback)- 20x Supine core ex.: marching/ bridging/ hip  adduction with ball 20x with holds and added SAQ 20x.  Cuing for proper breathing.  Marked improvement in maintain core contraction Reviewed HEP    Neuro:  //-bar walking: forward/ backwards/ lateral with min. To no UE assist working on hip flexion/ step pattern. High activities: marching with no assistive device in hallway.  Head turns in hallway (letters/ numbers).  Lateral walking with cuing for upright posture.  Walking with EC in mid-hallway 40 feet with no mistakes or LOB.   Recip. Stair climbing with use of 1 UE assist on handrail.       PT Long Term Goals - 11/09/18 0948      PT LONG TERM GOAL #1   Title  Pt. will improve FOTO to 62 to improve functional mobility.      Baseline  Initial FOTO: 54.     Time  4    Period  Weeks    Status  On-going    Target Date  12/01/18      PT LONG TERM GOAL #2   Title  Pt. will increase Berg balance test to >50/56 to improve safety/ decrease fall risk with daily tasks.      Baseline  Berg: 46/56.  Time  4    Period  Weeks    Status  On-going    Target Date  12/01/18      PT LONG TERM GOAL #3   Title  Pt. will increase B LE muscle strength 1/2 muscle grade to improve standing tolerance/ gait pattern.      Baseline  B LE AROM WFL and L LE strength grossly 5/5 MMT except hip flexion 3+/5 MMT. R LE muscle strength grossly 4/5 MMT except hip flexion 3-/5 MMT.    Time  4    Period  Weeks    Status  Not Met    Target Date  12/01/18      PT LONG TERM GOAL #4   Title  Pt. able to ascend/ descend 15 stairs with recip. pattern and use of 1 handrail safely to be able to go to 2nd floor of house.      Baseline  Pt. able to ascend stair with recip. pattern but descends with step to pattern for safety.      Time  4    Period  Weeks    Status  Partially Met    Target Date  12/01/18         Plan - 11/12/18 1402    Clinical Impression Statement  Pt. is more aware of core muscle activiation during ther.ex./ balance tasks in clinic.  Pt.  is hesitiant with hallway balance tasks with CGA for safety/ verbal cuing with no LOB today.  Pt. able to walk 1/2 length of hallway with EC in middle of hallway with good control but limited step length/ BOS.  Mod. independence with recip. stair climbing with 1 UE assist on handrail for safety.  PT will review goals next tx. session prior to MD f/u visit.      Clinical Presentation  Stable    Clinical Decision Making  Low    Rehab Potential  Fair    PT Frequency  2x / week    PT Duration  4 weeks    PT Treatment/Interventions  ADLs/Self Care Home Management;Aquatic Therapy;Cryotherapy;Moist Heat;Gait training;Stair training;Functional mobility training;Neuromuscular re-education;Balance training;Therapeutic exercise;Therapeutic activities;Patient/family education;Manual techniques;Passive range of motion    PT Next Visit Plan  Progress HEP/ balance tasks.  Discuss MD f/u visit.      PT Home Exercise Plan  see handouts       Patient will benefit from skilled therapeutic intervention in order to improve the following deficits and impairments:  Abnormal gait, Impaired sensation, Improper body mechanics, Pain, Postural dysfunction, Decreased mobility, Decreased activity tolerance, Decreased endurance, Decreased range of motion, Decreased strength, Hypomobility, Impaired flexibility, Difficulty walking, Decreased balance  Visit Diagnosis: S/P lumbar spinal fusion  Chronic low back pain, unspecified back pain laterality, unspecified whether sciatica present  Muscle weakness (generalized)  Gait difficulty     Problem List Patient Active Problem List   Diagnosis Date Noted  . Spondylolisthesis of lumbar region 05/04/2018  . Abnormal glucose level 02/12/2018  . Abnormal weight gain 02/12/2018  . Allergic rhinitis 02/12/2018  . Asthma 02/12/2018  . Bradycardia 02/12/2018  . Edema 02/12/2018  . Heart murmur 02/12/2018  . Hypercalcemia 02/12/2018  . Hypokalemia 02/12/2018  . Pure  hypercholesterolemia 02/12/2018  . Shoulder joint pain 02/12/2018  . Skin sensation disturbance 02/12/2018  . Syncope 02/12/2018  . Vitamin D deficiency 02/12/2018  . 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 Muscle spasticity 04/24/2017  . Weight loss 12/19/2016  . Hypothyroidism  11/24/2016  . Bilateral leg edema 11/24/2016  . Cervical radiculopathy 11/24/2016  . Disc degeneration, lumbar 11/24/2016  . Essential hypertension 11/24/2016  . Gallstones 11/24/2016  . History of anxiety state 11/24/2016  . Hyperlipidemia, unspecified 11/24/2016  . Spinal stenosis of lumbar region with neurogenic claudication 11/08/2016  . Anxiety 06/08/2015   Pura Spice, PT, DPT # [id number] 11/12/2018, 2:10 PM  [place] [facility] [address] [place], [place], [address] Phone: [phone]   Fax:  [phone]  Name: Evelyn Cisneros MRN: [id number] Date of Birth: April 15, 1941

## 2018-11-13 ENCOUNTER — Encounter: Payer: Medicare Other | Admitting: Physical Therapy

## 2018-11-18 ENCOUNTER — Encounter: Payer: Self-pay | Admitting: Physical Therapy

## 2018-11-18 ENCOUNTER — Ambulatory Visit: Payer: Medicare Other | Admitting: Physical Therapy

## 2018-11-18 DIAGNOSIS — M545 Low back pain, unspecified: Secondary | ICD-10-CM

## 2018-11-18 DIAGNOSIS — Z981 Arthrodesis status: Secondary | ICD-10-CM | POA: Diagnosis not present

## 2018-11-18 DIAGNOSIS — M6281 Muscle weakness (generalized): Secondary | ICD-10-CM

## 2018-11-18 DIAGNOSIS — R269 Unspecified abnormalities of gait and mobility: Secondary | ICD-10-CM

## 2018-11-18 DIAGNOSIS — G8929 Other chronic pain: Secondary | ICD-10-CM

## 2018-11-18 NOTE — Therapy (Addendum)
[place] [facility] [address] [place], [place], [address] Phone: [phone]   Fax:  [phone]  Physical Therapy Treatment  Patient Details  Name: Evelyn Cisneros MRN: [id number] Date of Birth: 03-10-1941 Referring Provider (PT): Dr. Newman Pies   Encounter Date: 11/18/2018   Treatment: 8 of 13.  Recert date: 8/59/2924 0947 to 1042   Past Medical History:  Diagnosis Date  . Anemia   . Arthritis   . Heart murmur   . Hypertension   . Hypothyroidism   . Spondylolisthesis of lumbar region     Past Surgical History:  Procedure Laterality Date  . ABDOMINAL HYSTERECTOMY    . REPLACEMENT TOTAL KNEE BILATERAL    . SPINE SURGERY      There were no vitals filed for this visit.    Pt. reports doing a lot of walking since last PT tx. session. Pt. had to walk all over hospital for Botox tx. to R forearm (benefits reported) and with [facility].         Treatment:  There.ex.:  Nustep L5-610 min. B UE/LE (no charge/ warm-up). Seated/ supine alt. UE and LE ex. With core muscle activation (mirror feedback)- 20x Supine core ex.: marching/ bridging/ hip adduction with ball 20x with holds and added SAQ 20x.  Cuing for proper breathing.  Marked improvement in maintain core contraction Reviewed HEP  Neuro:  //-bar walking: forward/ backwards/ lateral with min. To no UE assist working on hip flexion/ step pattern. High activities: marching with no assistive device in hallway. Head turns in hallway (letters/ numbers). Lateral walking with cuing for upright posture.  Walking with EC in mid-hallway 40 feet with no mistakes or LOB.   Recip. Stair climbing with use of 1 UE assist on handrail.       PT Long Term Goals - 11/09/18 0948      PT LONG TERM GOAL #1   Title  Pt. will improve FOTO to 62 to improve functional mobility.      Baseline  Initial FOTO: 54.     Time  4    Period  Weeks    Status   On-going    Target Date  12/01/18      PT LONG TERM GOAL #2   Title  Pt. will increase Berg balance test to >50/56 to improve safety/ decrease fall risk with daily tasks.      Baseline  Berg: 46/56.      Time  4    Period  Weeks    Status  On-going    Target Date  12/01/18      PT LONG TERM GOAL #3   Title  Pt. will increase B LE muscle strength 1/2 muscle grade to improve standing tolerance/ gait pattern.      Baseline  B LE AROM WFL and L LE strength grossly 5/5 MMT except hip flexion 3+/5 MMT. R LE muscle strength grossly 4/5 MMT except hip flexion 3-/5 MMT.    Time  4    Period  Weeks    Status  Not Met    Target Date  12/01/18      PT LONG TERM GOAL #4   Title  Pt. able to ascend/ descend 15 stairs with recip. pattern and use of 1 handrail safely to be able to go to 2nd floor of house.      Baseline  Pt. able to ascend stair with recip. pattern but descends with step to  pattern for safety.      Time  4    Period  Weeks    Status  Partially Met    Target Date  12/01/18         Pt. ascending/ descending stairs with reciprocal pattern and use of R handrail/ SPC. Pt. completes standing 4# resisted ther.ex. with no increase c/o pain and improving muscle endurance. No LOB during tx. session but light UE assist required for safety with resisted gait in //-bars and partial lunges. Pt. will continue with daily walking/ HEP to improve safety with walking/ overall endurance with daily tasks.       Patient will benefit from skilled therapeutic intervention in order to improve the following deficits and impairments:  Abnormal gait, Impaired sensation, Improper body mechanics, Pain, Postural dysfunction, Decreased mobility, Decreased activity tolerance, Decreased endurance, Decreased range of motion, Decreased strength, Hypomobility, Impaired flexibility, Difficulty walking, Decreased balance  Visit Diagnosis: S/P lumbar spinal fusion  Chronic low back pain, unspecified back pain  laterality, unspecified whether sciatica present  Muscle weakness (generalized)  Gait difficulty     Problem List Patient Active Problem List   Diagnosis Date Noted  . Spondylolisthesis of lumbar region 05/04/2018  . Abnormal glucose level 02/12/2018  . Abnormal weight gain 02/12/2018  . Allergic rhinitis 02/12/2018  . Asthma 02/12/2018  . Bradycardia 02/12/2018  . Edema 02/12/2018  . Heart murmur 02/12/2018  . Hypercalcemia 02/12/2018  . Hypokalemia 02/12/2018  . Pure hypercholesterolemia 02/12/2018  . Shoulder joint pain 02/12/2018  . Skin sensation disturbance 02/12/2018  . Syncope 02/12/2018  . Vitamin D deficiency 02/12/2018  . 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 Muscle spasticity 04/24/2017  . Weight loss 12/19/2016  . Hypothyroidism 11/24/2016  . Bilateral leg edema 11/24/2016  . Cervical radiculopathy 11/24/2016  . Disc degeneration, lumbar 11/24/2016  . Essential hypertension 11/24/2016  . Gallstones 11/24/2016  . History of anxiety state 11/24/2016  . Hyperlipidemia, unspecified 11/24/2016  . Spinal stenosis of lumbar region with neurogenic claudication 11/08/2016  . Anxiety 06/08/2015   Pura Spice, PT, DPT # [phone] 11/24/2018, 7:51 AM  [place] [facility] [address] [place], [place], [address] Phone: [phone]   Fax:  [phone]  Name: Evelyn Cisneros MRN: [id number] Date of Birth: 04-Jul-1941

## 2018-11-20 ENCOUNTER — Ambulatory Visit: Payer: Medicare Other | Admitting: Physical Therapy

## 2018-11-20 DIAGNOSIS — Z981 Arthrodesis status: Secondary | ICD-10-CM

## 2018-11-20 DIAGNOSIS — M545 Low back pain, unspecified: Secondary | ICD-10-CM

## 2018-11-20 DIAGNOSIS — M6281 Muscle weakness (generalized): Secondary | ICD-10-CM

## 2018-11-20 DIAGNOSIS — G8929 Other chronic pain: Secondary | ICD-10-CM

## 2018-11-20 DIAGNOSIS — R269 Unspecified abnormalities of gait and mobility: Secondary | ICD-10-CM

## 2018-11-20 NOTE — Therapy (Addendum)
[facility] [address] [place], [place], [address] Phone: [phone]   Fax:  [phone]  Physical Therapy Treatment  Patient Details  Name: Evelyn Cisneros MRN: [id number] Date of Birth: 07/04/1941 Referring Provider (PT): Dr. Newman Pies   Encounter Date: 11/20/2018  PT End of Session - 11/24/18 0756    Visit Number  9    Number of Visits  13    Date for PT Re-Evaluation  12/01/18    PT Start Time  1111    PT Stop Time  1202    PT Time Calculation (min)  51 min    Equipment Utilized During Treatment  Gait belt    Activity Tolerance  Patient tolerated treatment well    Behavior During Therapy  [facility] for tasks assessed/performed       Past Medical History:  Diagnosis Date  . Anemia   . Arthritis   . Heart murmur   . Hypertension   . Hypothyroidism   . Spondylolisthesis of lumbar region     Past Surgical History:  Procedure Laterality Date  . ABDOMINAL HYSTERECTOMY    . REPLACEMENT TOTAL KNEE BILATERAL    . SPINE SURGERY      There were no vitals filed for this visit.  Subjective Assessment - 11/24/18 0755    Subjective  Pt. reports no new complaints.  Pt. remains active with walking on a daily basis.  No c/o back pain.  Pt. entered PT with use of SPC and consistent gait pattern.      Patient is accompained by:  Family member    Pertinent History  Pt. reports fall 8 years ago causing increase low back pain/ issues.  Pt. s/p L TKA (10 years ago), R TKA (5 years ago).  Pt. has R grasping issues.      Limitations  Sitting;Lifting;Standing;Walking;House hold activities    Diagnostic tests  MRI/ X-rays    Patient Stated Goals  Increase core/ LE muscle strengthening and improve walking/ balance.      Currently in Pain?  No/denies        Treatment:  There.ex.:  Nustep L6-510 min. B UE/LE (no charge/ warm-up). Seated/ standing 4# LE ex.: marching/ LAQ/ heel and toe raises/ hip abduction/ walking in //-bars with  cuing for posture.  Sit to stands from blue mat (varying heights)- 4# lateral walking in front of blue mat.   Walking lunges in //-bars with short quad holds. ReviewedHEP  Neuro:  Resisted gait: 1BTB 5x all 4-planes. //-bar walking: forward/ backwards/ lateral with min. To no UE assist working on hip flexion/ step pattern. Highactivities:marching with no assistive device in hallway. Head turns in hallway (letters/ numbers). Lateral walking withcuing for upright posture.   Gait training:  Amb. In PT clinic/ hallway with cuing to increase consistent heel strike/ step pattern with no assistive device.   Recip. Stair climbing with use of 1 UE assist on handrail.  Working on heel clearance/ quad control with descending.     PT Long Term Goals - 11/09/18 0948      PT LONG TERM GOAL #1   Title  Pt. will improve FOTO to 62 to improve functional mobility.      Baseline  Initial FOTO: 54.     Time  4    Period  Weeks    Status  On-going    Target Date  12/01/18      PT LONG TERM GOAL #2   Title  Pt.  will increase Berg balance test to >50/56 to improve safety/ decrease fall risk with daily tasks.      Baseline  Berg: 46/56.      Time  4    Period  Weeks    Status  On-going    Target Date  12/01/18      PT LONG TERM GOAL #3   Title  Pt. will increase B LE muscle strength 1/2 muscle grade to improve standing tolerance/ gait pattern.      Baseline  B LE AROM WFL and L LE strength grossly 5/5 MMT except hip flexion 3+/5 MMT. R LE muscle strength grossly 4/5 MMT except hip flexion 3-/5 MMT.    Time  4    Period  Weeks    Status  Not Met    Target Date  12/01/18      PT LONG TERM GOAL #4   Title  Pt. able to ascend/ descend 15 stairs with recip. pattern and use of 1 handrail safely to be able to go to 2nd floor of house.      Baseline  Pt. able to ascend stair with recip. pattern but descends with step to pattern for safety.      Time  4    Period  Weeks    Status   Partially Met    Target Date  12/01/18            Plan - 11/24/18 0757    Clinical Impression Statement  Pt. demonstrates improved ability to ambulate with consistent 2-point gait recip. pattern with heel strike.  Forward head/ flexed posture with standing ther.ex. and gait/balance training in clinic.  Pt. able to correct posture with verbal cuing and mirror feedback in //-bars.  Few seated rest breaks required and pt. works hard during tx. session.  PT will reassess Berg balance test next tx. session.      Clinical Presentation  Stable    Clinical Decision Making  Low    Rehab Potential  Fair    PT Frequency  2x / week    PT Duration  4 weeks    PT Treatment/Interventions  ADLs/Self Care Home Management;Aquatic Therapy;Cryotherapy;Moist Heat;Gait training;Stair training;Functional mobility training;Neuromuscular re-education;Balance training;Therapeutic exercise;Therapeutic activities;Patient/family education;Manual techniques;Passive range of motion    PT Next Visit Plan  Progress HEP/ balance tasks.      PT Home Exercise Plan  see handouts       Patient will benefit from skilled therapeutic intervention in order to improve the following deficits and impairments:  Abnormal gait, Impaired sensation, Improper body mechanics, Pain, Postural dysfunction, Decreased mobility, Decreased activity tolerance, Decreased endurance, Decreased range of motion, Decreased strength, Hypomobility, Impaired flexibility, Difficulty walking, Decreased balance  Visit Diagnosis: S/P lumbar spinal fusion  Chronic low back pain, unspecified back pain laterality, unspecified whether sciatica present  Muscle weakness (generalized)  Gait difficulty     Problem List Patient Active Problem List   Diagnosis Date Noted  . Spondylolisthesis of lumbar region 05/04/2018  . Abnormal glucose level 02/12/2018  . Abnormal weight gain 02/12/2018  . Allergic rhinitis 02/12/2018  . Asthma 02/12/2018  .  Bradycardia 02/12/2018  . Edema 02/12/2018  . Heart murmur 02/12/2018  . Hypercalcemia 02/12/2018  . Hypokalemia 02/12/2018  . Pure hypercholesterolemia 02/12/2018  . Shoulder joint pain 02/12/2018  . Skin sensation disturbance 02/12/2018  . Syncope 02/12/2018  . Vitamin D deficiency 02/12/2018  . 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) 04/24/2017  . Muscle  spasticity 04/24/2017  . Weight loss 12/19/2016  . Hypothyroidism 11/24/2016  . Bilateral leg edema 11/24/2016  . Cervical radiculopathy 11/24/2016  . Disc degeneration, lumbar 11/24/2016  . Essential hypertension 11/24/2016  . Gallstones 11/24/2016  . History of anxiety state 11/24/2016  . Hyperlipidemia, unspecified 11/24/2016  . Spinal stenosis of lumbar region with neurogenic claudication 11/08/2016  . Anxiety 06/08/2015   Pura Spice, PT, DPT # [phone] 11/24/2018, 8:17 AM  [facility] [address] [place], [place], [address] Phone: [phone]   Fax:  [phone]  Name: Jiovanna Frei MRN: [id number] Date of Birth: June 09, 1941

## 2018-11-24 ENCOUNTER — Ambulatory Visit: Payer: Medicare Other | Attending: Neurosurgery | Admitting: Physical Therapy

## 2018-11-24 DIAGNOSIS — Z981 Arthrodesis status: Secondary | ICD-10-CM | POA: Diagnosis present

## 2018-11-24 DIAGNOSIS — M545 Low back pain, unspecified: Secondary | ICD-10-CM

## 2018-11-24 DIAGNOSIS — G8929 Other chronic pain: Secondary | ICD-10-CM

## 2018-11-24 DIAGNOSIS — M6281 Muscle weakness (generalized): Secondary | ICD-10-CM

## 2018-11-24 DIAGNOSIS — R269 Unspecified abnormalities of gait and mobility: Secondary | ICD-10-CM | POA: Diagnosis present

## 2018-11-24 NOTE — Therapy (Signed)
[place] [address]. [place], [place], [address] Phone: [phone]   Fax:  [phone]  Physical Therapy Treatment  Patient Details  Name: Evelyn Cisneros MRN: [id number] Date of Birth: 1941-02-14 Referring Provider (PT): Dr. Newman Pies   Encounter Date: 11/24/2018  PT End of Session - 11/24/18 1110    Visit Number  10    Number of Visits  13    Date for PT Re-Evaluation  12/01/18    PT Start Time  1106    PT Stop Time  1216    PT Time Calculation (min)  70 min    Equipment Utilized During Treatment  Gait belt    Activity Tolerance  Patient tolerated treatment well    Behavior During Therapy  [facility] for tasks assessed/performed       Past Medical History:  Diagnosis Date  . Anemia   . Arthritis   . Heart murmur   . Hypertension   . Hypothyroidism   . Spondylolisthesis of lumbar region     Past Surgical History:  Procedure Laterality Date  . ABDOMINAL HYSTERECTOMY    . REPLACEMENT TOTAL KNEE BILATERAL    . SPINE SURGERY      There were no vitals filed for this visit.  Subjective Assessment - 11/24/18 1108    Subjective  Pt. ambulates into clinic with SPC. Pt. was active over the weekend with walking. Pt. had some pain after her slip on the stairs last Thursday. No complaints of pain today.    Patient is accompained by:  Family member    Pertinent History  Pt. reports fall 8 years ago causing increase low back pain/ issues.  Pt. s/p L TKA (10 years ago), R TKA (5 years ago).  Pt. has R grasping issues.      Limitations  Sitting;Lifting;Standing;Walking;House hold activities    Diagnostic tests  MRI/ X-rays    Patient Stated Goals  Increase core/ LE muscle strengthening and improve walking/ balance.      Currently in Pain?  No/denies    Pain Score  0-No pain    Pain Onset  More than a month ago        Treatment:  There.ex.:  Nustep L7 10 min. B UE/LE (no charge/ warm-up). Stair reassessment (2x with B  UE, 2x with L UE, 2x with R UE) B LE MMT reassessment (B LE grossly 5/5 except for hip flexion: R 4/5, L 4+/5) Standing hip ex./ posture correction with light UE assist Reviewed HEP  Neuro:  Berg balance assessment (completed all tasks with min CGA and min-no B UE assist) Obstacle course in hallway (included airex pad, cone weaving, stepping over objects with reciprocal pattern) 2x (1x with SPC, 1x without) Ball toss with rainbow ball 40x (wide BOS then narrow BOS), LOB noted with narrow BOS, min CGA from PT       PT Long Term Goals - 11/24/18 1148      PT LONG TERM GOAL #1   Title  Pt. will improve FOTO to 62 to improve functional mobility.      Baseline  11/24/2018: FOTO 82    Time  4    Period  Weeks    Status  Achieved    Target Date  11/24/18      PT LONG TERM GOAL #2   Title  Pt. will increase Berg balance test to >50/56 to improve safety/ decrease fall risk with daily tasks.  Baseline  11/24/2018 Berg 52/56    Time  4    Period  Weeks    Status  Achieved    Target Date  11/24/18      PT LONG TERM GOAL #3   Title  Pt. will increase B LE muscle strength 1/2 muscle grade to improve standing tolerance/ gait pattern.      Baseline  11/24/2018: B LE AROM WFL and L LE strength grossly 5/5 MMT except hip flexion 4+/5 MMT. R LE muscle strength grossly 4/5 MMT except hip flexion 4/5 MMT.    Time  4    Period  Weeks    Status  On-going    Target Date  12/01/18      PT LONG TERM GOAL #4   Title  Pt. able to ascend/ descend 15 stairs with recip. pattern and use of 1 handrail safely to be able to go to 2nd floor of house.      Baseline  Pt. able to ascend stair with recip. pattern but descends with step to pattern for safety.      Time  4    Period  Weeks    Status  Partially Met    Target Date  12/01/18            Plan - 11/24/18 1111    Clinical Impression Statement  Pt. ambulated into clinic with [facility] but states she only uses it out in the community because she  feels comfortable ambulating with no AD in the house. Pt. improved her Berg score to 52 and was able to complete all tasks on Berg with min-no B UE and no SPC. Pt. completed obstacle course in hallway 2x (1x with SPC, 1x without SPC) and was able to complete with no LOB and min CGA from PT. Pt. was able to complete 40 ball tosses with light weight balll with min CGA from PT. Pt. demonstrated some LOB with narrow BOS during ball toss/ throw but no increase in sx. Pt. showed improvement in stair reciprocal pattern but states still must crawl up her stairs at home due to how steep they are and the high hand rail. Pt. demonstrated improved strength for MMT in B LE (still limited in hip flexion on B LE).  Pt. states she is happy with her progress in PT but would still like for her LE (especially R LE) to be stronger. Pt. will continue to benefit from skilled PT services in order to improve B LE strength and and balance activities to normalize gait pattern and improve safe functional mobility.     Clinical Presentation  Stable    Clinical Decision Making  Low    Rehab Potential  Fair    PT Frequency  2x / week    PT Duration  4 weeks    PT Treatment/Interventions  ADLs/Self Care Home Management;Aquatic Therapy;Cryotherapy;Moist Heat;Gait training;Stair training;Functional mobility training;Neuromuscular re-education;Balance training;Therapeutic exercise;Therapeutic activities;Patient/family education;Manual techniques;Passive range of motion    PT Next Visit Plan  update HEP for progression of LE strengthening, continue LE strengthening in therapy and balance    PT Home Exercise Plan  see handouts    Consulted and Agree with Plan of Care  Patient       Patient will benefit from skilled therapeutic intervention in order to improve the following deficits and impairments:  Abnormal gait, Impaired sensation, Improper body mechanics, Pain, Postural dysfunction, Decreased mobility, Decreased activity tolerance,  Decreased endurance, Decreased range of motion, Decreased strength, Hypomobility, Impaired  flexibility, Difficulty walking, Decreased balance  Visit Diagnosis: S/P lumbar spinal fusion  Chronic low back pain, unspecified back pain laterality, unspecified whether sciatica present  Muscle weakness (generalized)  Gait difficulty  Chronic bilateral low back pain, unspecified whether sciatica present     Problem List Patient Active Problem List   Diagnosis Date Noted  . Spondylolisthesis of lumbar region 05/04/2018  . Abnormal glucose level 02/12/2018  . Abnormal weight gain 02/12/2018  . Allergic rhinitis 02/12/2018  . Asthma 02/12/2018  . Bradycardia 02/12/2018  . Edema 02/12/2018  . Heart murmur 02/12/2018  . Hypercalcemia 02/12/2018  . Hypokalemia 02/12/2018  . Pure hypercholesterolemia 02/12/2018  . Shoulder joint pain 02/12/2018  . Skin sensation disturbance 02/12/2018  . Syncope 02/12/2018  . Vitamin D deficiency 02/12/2018  . 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 Muscle spasticity 04/24/2017  . Weight loss 12/19/2016  . Hypothyroidism 11/24/2016  . Bilateral leg edema 11/24/2016  . Cervical radiculopathy 11/24/2016  . Disc degeneration, lumbar 11/24/2016  . Essential hypertension 11/24/2016  . Gallstones 11/24/2016  . History of anxiety state 11/24/2016  . Hyperlipidemia, unspecified 11/24/2016  . Spinal stenosis of lumbar region with neurogenic claudication 11/08/2016  . Anxiety 06/08/2015   Pura Spice, PT, DPT # [id number] CHYIFO [id number], SPT 11/24/2018, 12:14 PM  [facility] [address] [place], [place], [address] Phone: [phone]   Fax:  [phone]  Name: Evelyn Cisneros MRN: [id number] Date of Birth: [birth year]

## 2018-11-25 ENCOUNTER — Encounter: Payer: Self-pay | Admitting: Physical Therapy

## 2018-11-27 ENCOUNTER — Encounter: Payer: Self-pay | Admitting: Physical Therapy

## 2018-11-27 ENCOUNTER — Ambulatory Visit: Payer: Medicare Other | Admitting: Physical Therapy

## 2018-11-27 DIAGNOSIS — R269 Unspecified abnormalities of gait and mobility: Secondary | ICD-10-CM

## 2018-11-27 DIAGNOSIS — Z981 Arthrodesis status: Secondary | ICD-10-CM | POA: Diagnosis not present

## 2018-11-27 DIAGNOSIS — M545 Low back pain, unspecified: Secondary | ICD-10-CM

## 2018-11-27 DIAGNOSIS — G8929 Other chronic pain: Secondary | ICD-10-CM

## 2018-11-27 DIAGNOSIS — M6281 Muscle weakness (generalized): Secondary | ICD-10-CM

## 2018-11-27 NOTE — Therapy (Signed)
[facility] [address] [place], [place], [address] Phone: [phone]   Fax:  [phone]  Physical Therapy Treatment  Patient Details  Name: Evelyn Cisneros MRN: [id number] Date of Birth: 21-May-1941 Referring Provider (PT): Dr. Newman Pies   Encounter Date: 11/27/2018  Treatment: 11 of 13.  Recert date: 06/18/9370 1108 to 1201    There were no vitals filed for this visit.  Subjective Assessment - 11/27/18 1114    Subjective  Pt. states she has been busy over past several days.  Pt. entered PT with use of SPC.      Patient is accompained by:  Family member    Pertinent History  Pt. reports fall 8 years ago causing increase low back pain/ issues.  Pt. s/p L TKA (10 years ago), R TKA (5 years ago).  Pt. has R grasping issues.      Limitations  Sitting;Lifting;Standing;Walking;House hold activities    Diagnostic tests  MRI/ X-rays    Patient Stated Goals  Increase core/ LE muscle strengthening and improve walking/ balance.      Currently in Pain?  No/denies         Treatment:  There.ex.:  Standing hip ex.: 3# walking marches forward and backwards in //-bars.  Seated LAQ/ standing heel raises/ lateral walking/ 3" step recip. Step touches (min. To no UE assist)- 20x each.   Nustep L7 10 min. B UE/LE (no charge). See new HEP  Neuro:  Walking in hallway with no SPC working on posture/ step pattern/ heel strike.  Added small ball toss/ alt. UE and LE touches with CGA for safety/ verbal cuing.   Tandem gait (3# ankle wts) in //-bars Star activities: touches with L/R (CGA for safety).   Stair: recip. Gait with 1 UE assist     PT Long Term Goals - 11/24/18 1148      PT LONG TERM GOAL #1   Title  Pt. will improve FOTO to 62 to improve functional mobility.      Baseline  11/24/2018: FOTO 82    Time  4    Period  Weeks    Status  Achieved    Target Date  11/24/18      PT LONG TERM GOAL #2   Title  Pt. will increase  Berg balance test to >50/56 to improve safety/ decrease fall risk with daily tasks.      Baseline  11/24/2018 Berg 52/56    Time  4    Period  Weeks    Status  Achieved    Target Date  11/24/18      PT LONG TERM GOAL #3   Title  Pt. will increase B LE muscle strength 1/2 muscle grade to improve standing tolerance/ gait pattern.      Baseline  11/24/2018: B LE AROM WFL and L LE strength grossly 5/5 MMT except hip flexion 4+/5 MMT. R LE muscle strength grossly 4/5 MMT except hip flexion 4/5 MMT.    Time  4    Period  Weeks    Status  On-going    Target Date  12/01/18      PT LONG TERM GOAL #4   Title  Pt. able to ascend/ descend 15 stairs with recip. pattern and use of 1 handrail safely to be able to go to 2nd floor of house.      Baseline  Pt. able to ascend stair with recip. pattern but descends with step to pattern for safety.  Time  4    Period  Weeks    Status  Partially Met    Target Date  12/01/18       Pt. ambulated into clinic with [facility] but was able to complete all therex/ balance training without SPC. Pt. ambulates in hallway without SPC with decr. step length, decr. R arm swing and slight R Trendelenburg. Pt. needs min verbal cueing when ambulating to increase B heel strike, hip flexion and R arm swing. Pt. was able to tolerate dynamic balance exercises with 3 rest breaks requested. Pt. needed light UE touch during dynamic balance activities with 3# ankle weights. Pt. was instructed/ educated on new HEP and using ankle weights for standing strengthening. Pt. will continue to benefit from skilled PT services to increase R LE muscle strength/ balance to improve mobility.     Plan - 11/27/18 1115    Clinical Presentation  Stable    Clinical Decision Making  Low    Rehab Potential  Fair    PT Frequency  2x / week    PT Duration  4 weeks    PT Treatment/Interventions  ADLs/Self Care Home Management;Aquatic Therapy;Cryotherapy;Moist Heat;Gait training;Stair training;Functional  mobility training;Neuromuscular re-education;Balance training;Therapeutic exercise;Therapeutic activities;Patient/family education;Manual techniques;Passive range of motion    PT Next Visit Plan  Review HEP    PT Home Exercise Plan  see handouts    Consulted and Agree with Plan of Care  Patient       Patient will benefit from skilled therapeutic intervention in order to improve the following deficits and impairments:  Abnormal gait, Impaired sensation, Improper body mechanics, Pain, Postural dysfunction, Decreased mobility, Decreased activity tolerance, Decreased endurance, Decreased range of motion, Decreased strength, Hypomobility, Impaired flexibility, Difficulty walking, Decreased balance  Visit Diagnosis: S/P lumbar spinal fusion  Chronic low back pain, unspecified back pain laterality, unspecified whether sciatica present  Muscle weakness (generalized)  Gait difficulty    Pura Spice, PT, DPT # [phone] 11/29/2018, 5:48 PM  [place] [address] [place], [place], [address] Phone: [phone]   Fax:  [phone]  Name: Evelyn Cisneros MRN: [id number] Date of Birth: 1941/10/03

## 2018-11-27 NOTE — Patient Instructions (Signed)
Access Code: [email or web address]  URL: [email or web address]  Date: 11/27/2018  Prepared by: Dorene GrebeMichael Sherk   Exercises  Standing Hip Abduction with Unilateral Counter Support - 15 reps - 2 sets - 1x daily - 7x weekly  Standing March with Unilateral Counter Support - 15 reps - 2 sets - 1x daily - 7x weekly  Standing Hip Extension with Counter Support - 15 reps - 2 sets - 1x daily - 7x weekly  Heel rises with counter support - 15 reps - 2 sets - 1x daily - 7x weekly

## 2018-12-03 ENCOUNTER — Ambulatory Visit: Payer: Medicare Other | Admitting: Physical Therapy

## 2018-12-03 ENCOUNTER — Encounter: Payer: Self-pay | Admitting: Physical Therapy

## 2018-12-03 DIAGNOSIS — M545 Low back pain, unspecified: Secondary | ICD-10-CM

## 2018-12-03 DIAGNOSIS — Z981 Arthrodesis status: Secondary | ICD-10-CM | POA: Diagnosis not present

## 2018-12-03 DIAGNOSIS — M6281 Muscle weakness (generalized): Secondary | ICD-10-CM

## 2018-12-03 DIAGNOSIS — R269 Unspecified abnormalities of gait and mobility: Secondary | ICD-10-CM

## 2018-12-03 DIAGNOSIS — G8929 Other chronic pain: Secondary | ICD-10-CM

## 2018-12-03 NOTE — Therapy (Signed)
[facility] [address] [place], [place], [address] Phone: [phone]   Fax:  [phone]  Physical Therapy Treatment Progress Report 11/03/2018 to 12/03/2018   Patient Details  Name: Evelyn Cisneros MRN: [id number] Date of Birth: 07-06-1941 Referring Provider (PT): Dr. Newman Pies   Encounter Date: 12/03/2018  PT End of Session - 12/03/18 1205    Visit Number  12    Number of Visits  20    Date for PT Re-Evaluation  12/31/18    PT Start Time  1103    PT Stop Time  1154    PT Time Calculation (min)  51 min    Equipment Utilized During Treatment  Gait belt    Activity Tolerance  Patient tolerated treatment well;Patient limited by fatigue    Behavior During Therapy  [facility] for tasks assessed/performed       Past Medical History:  Diagnosis Date  . Anemia   . Arthritis   . Heart murmur   . Hypertension   . Hypothyroidism   . Spondylolisthesis of lumbar region     Past Surgical History:  Procedure Laterality Date  . ABDOMINAL HYSTERECTOMY    . REPLACEMENT TOTAL KNEE BILATERAL    . SPINE SURGERY      There were no vitals filed for this visit.  Subjective Assessment - 12/03/18 1159    Subjective  Pt. states she has been doing new HEP but has not had the chance to get ankle weights yet. Pt. has noticed that she has been able to lift her leg up higher when doing marching therex.    Patient is accompained by:  Family member    Pertinent History  Pt. reports fall 8 years ago causing increase low back pain/ issues.  Pt. s/p L TKA (10 years ago), R TKA (5 years ago).  Pt. has R grasping issues.      Limitations  Sitting;Lifting;Standing;Walking;House hold activities    Diagnostic tests  MRI/ X-rays    Patient Stated Goals  Increase core/ LE muscle strengthening and improve walking/ balance.      Currently in Pain?  No/denies    Pain Score  0-No pain    Pain Location  Back    Pain Orientation  Mid;Lower    Pain Type  Chronic  pain    Pain Onset  More than a month ago        Treatment:  There.ex.:  Nustep L610 min. B UE/LE (no charge). Fwd/bkwd walking in //-bars 3 laps Side step in //-bars 4# ankle weights 3 laps Seated LAQ and marching 4# 2x15 Sit to stand 2x5 (no UE assist but heavy forward trunk lean)   Neuro:  Tandem gait in //-bars 3 laps Stair: recip. Gait with 1 UE assist Stair taps to 6 inch step 2x15 Ball toss with min CGA from PT 20x Walking cone taps on agility ladder 3 laps with mod CGA assist from PT Airex balance: narrow BOS and SLB (pt. Unable to SLB on R LE)    PT Long Term Goals - 12/03/18 1202      PT LONG TERM GOAL #1   Title  Pt. will improve FOTO to 62 to improve functional mobility.      Baseline  11/24/2018: FOTO 82    Time  4    Period  Weeks    Status  Achieved    Target Date  11/24/18      PT LONG TERM GOAL #2  Title  Pt. will increase Berg balance test to >50/56 to improve safety/ decrease fall risk with daily tasks.      Baseline  11/24/2018 Berg 52/56    Time  4    Period  Weeks    Status  Achieved    Target Date  11/24/18      PT LONG TERM GOAL #3   Title  Pt. will increase B LE muscle strength 1/2 muscle grade to improve standing tolerance/ gait pattern.      Baseline  11/24/2018: B LE AROM WFL and L LE strength grossly 5/5 MMT except hip flexion 4+/5 MMT. R LE muscle strength grossly 4/5 MMT except hip flexion 4/5 MMT.    Time  4    Period  Weeks    Status  Partially Met    Target Date  12/31/18      PT LONG TERM GOAL #4   Title  Pt. able to ascend/ descend 15 stairs with recip. pattern and use of 1 handrail safely to be able to go to 2nd floor of house.      Baseline  Pt. able to ascend stair with recip. pattern but descends with step to pattern for safety.      Time  4    Period  Weeks    Status  Partially Met    Target Date  12/31/18        Plan - 12/03/18 1210    Clinical Impression Statement  Pt. needs min CGA assist throughout therex/  ambulating in clinic with no AD. Pt. was able to tolerate incr. ankle weight to 4# during side step and seated therex but c/o of R LE fatigue towards the end. Pt. demonstrates difficulty with tandem walking and needs min CGA throughout. Pt. also needed min-mod CGA with walking cone taps when shifting weight to stand on one LE. Pt. was unable to raise L LE and SLB on R leg but was able to complete airex SLB on L LE for ~10 seconds. Pt. was able to complete ball toss with min CGA and no LOB. Pt. will continue to benefit from skilled PT services in order to improve B LE strengthening and balance in order to promote safe ambulation with SPC in the community and pain-free ADLs.     Clinical Presentation  Stable    Clinical Decision Making  Low    Rehab Potential  Fair    PT Frequency  2x / week    PT Duration  4 weeks    PT Treatment/Interventions  ADLs/Self Care Home Management;Aquatic Therapy;Cryotherapy;Moist Heat;Gait training;Stair training;Functional mobility training;Neuromuscular re-education;Balance training;Therapeutic exercise;Therapeutic activities;Patient/family education;Manual techniques;Passive range of motion    PT Next Visit Plan  progress balance, LE strengthening discharge?    PT Home Exercise Plan  see handouts    Consulted and Agree with Plan of Care  Patient       Patient will benefit from skilled therapeutic intervention in order to improve the following deficits and impairments:  Abnormal gait, Impaired sensation, Improper body mechanics, Pain, Postural dysfunction, Decreased mobility, Decreased activity tolerance, Decreased endurance, Decreased range of motion, Decreased strength, Hypomobility, Impaired flexibility, Difficulty walking, Decreased balance  Visit Diagnosis: S/P lumbar spinal fusion  Chronic low back pain, unspecified back pain laterality, unspecified whether sciatica present  Muscle weakness (generalized)  Gait difficulty     Problem List Patient Active  Problem List   Diagnosis Date Noted  . Spondylolisthesis of lumbar region 05/04/2018  . Abnormal glucose level  02/12/2018  . Abnormal weight gain 02/12/2018  . Allergic rhinitis 02/12/2018  . Asthma 02/12/2018  . Bradycardia 02/12/2018  . Edema 02/12/2018  . Heart murmur 02/12/2018  . Hypercalcemia 02/12/2018  . Hypokalemia 02/12/2018  . Pure hypercholesterolemia 02/12/2018  . Shoulder joint pain 02/12/2018  . Skin sensation disturbance 02/12/2018  . Syncope 02/12/2018  . Vitamin D deficiency 02/12/2018  . 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 Muscle spasticity 04/24/2017  . Weight loss 12/19/2016  . Hypothyroidism 11/24/2016  . Bilateral leg edema 11/24/2016  . Cervical radiculopathy 11/24/2016  . Disc degeneration, lumbar 11/24/2016  . Essential hypertension 11/24/2016  . Gallstones 11/24/2016  . History of anxiety state 11/24/2016  . Hyperlipidemia, unspecified 11/24/2016  . Spinal stenosis of lumbar region with neurogenic claudication 11/08/2016  . Anxiety 06/08/2015   Pura Spice, PT, DPT # [id number] [place], [place] 12/03/2018, 6:01 PM  [facility] [address] [place], [place], [address] Phone: [phone]   Fax:  [phone]  Name: Evelyn Cisneros MRN: [id number] Date of Birth: October 12, 1941

## 2018-12-07 ENCOUNTER — Ambulatory Visit: Payer: Medicare Other | Admitting: Physical Therapy

## 2018-12-07 ENCOUNTER — Encounter: Payer: Self-pay | Admitting: Physical Therapy

## 2018-12-07 DIAGNOSIS — M545 Low back pain: Secondary | ICD-10-CM

## 2018-12-07 DIAGNOSIS — M6281 Muscle weakness (generalized): Secondary | ICD-10-CM

## 2018-12-07 DIAGNOSIS — G8929 Other chronic pain: Secondary | ICD-10-CM

## 2018-12-07 DIAGNOSIS — Z981 Arthrodesis status: Secondary | ICD-10-CM | POA: Diagnosis not present

## 2018-12-07 DIAGNOSIS — R269 Unspecified abnormalities of gait and mobility: Secondary | ICD-10-CM

## 2018-12-07 NOTE — Therapy (Signed)
[facility] [address]. [place], [place], [address] Phone: [phone]   Fax:  [phone]  Physical Therapy Treatment  Patient Details  Name: Evelyn Cisneros MRN: [id number] Date of Birth: 1941-03-19 Referring Provider (PT): Dr. Newman Pies   Encounter Date: 12/07/2018  PT End of Session - 12/07/18 1030    Visit Number  13    Number of Visits  20    Date for PT Re-Evaluation  12/31/18    Authorization Type  2 of 10    PT Start Time  1029    PT Stop Time  1115    PT Time Calculation (min)  46 min    Equipment Utilized During Treatment  Gait belt    Activity Tolerance  Patient tolerated treatment well;Patient limited by fatigue    Behavior During Therapy  [facility] for tasks assessed/performed       Past Medical History:  Diagnosis Date  . Anemia   . Arthritis   . Heart murmur   . Hypertension   . Hypothyroidism   . Spondylolisthesis of lumbar region     Past Surgical History:  Procedure Laterality Date  . ABDOMINAL HYSTERECTOMY    . REPLACEMENT TOTAL KNEE BILATERAL    . SPINE SURGERY      There were no vitals filed for this visit.  Subjective Assessment - 12/07/18 1029    Subjective  Patient states that things are going well. Patient denies any falls. Patient states that she still hasn't had a chance to purchase ankle weights, but is hopeful. Patient would like to work on steps.     Patient is accompained by:  Family member    Pertinent History  Pt. reports fall 8 years ago causing increase low back pain/ issues.  Pt. s/p L TKA (10 years ago), R TKA (5 years ago).  Pt. has R grasping issues.      Limitations  Sitting;Lifting;Standing;Walking;House hold activities    Diagnostic tests  MRI/ X-rays    Patient Stated Goals  Increase core/ LE muscle strengthening and improve walking/ balance.      Currently in Pain?  No/denies    Pain Onset  More than a month ago         Outpatient Rehab from 04/01/2018 in [place]  Lymphedema Life Impact Scale Total Score  23.53 %     TREATMENT  Neuromuscular Re-education: Stair training, CGA/SBA. Patient negotiating steps with VCs and TCs for foot positioning on step to allow for appropriate hip musculature activation as well as balance maintenance for ascending and descending. Patient required cueing for engaging postural musculature to prevent loss of balance/ fear of loss of balance. BUE rails x3 SUE rail R, cane L, x3 SUE rail L, cane R, x3   Therapeutic Exercise: Seated, RTB, 2x10 each:  B Hip flexion, band around forefoot  B clamshell, band above knees  L clamshell  R clamshell  B LAQ Sit to stand, x10 Sit to stand, R foot bias, x5   Patient Response to interventions: Patient reported greater confidence with maintaining balance and safety negotiating standard height stairs after today's neuromuscular re-education training.  Patient educated throughout session on appropriate technique and form using multi-modal cueing, HEP and activity modification. Patient articulated understanding and returned demonstration.  ASSESSMENT Patient presents to clinic with excellent motivation to participate in therapy and had questions regarding discharge 2/2 to vacation; patient agreed to a trial home maintenance program while  on vacation with a follow-up visit upon her return. Patient demonstrates deficits in BLE strength and mobility as evidenced by significant Trendelenburg as well as increased reliance on UE support. Patient able to achieve step through gait pattern on stairs during today's session and responded positively to educational interventions. Patient will benefit from continued skilled therapeutic intervention to address remaining deficits in strength and mobility  in order to decrease risk of falls, increase function, and improve overall QOL.     PT Long Term Goals - 12/03/18 1202      PT LONG TERM GOAL #1    Title  Pt. will improve FOTO to 62 to improve functional mobility.      Baseline  11/24/2018: FOTO 82    Time  4    Period  Weeks    Status  Achieved    Target Date  11/24/18      PT LONG TERM GOAL #2   Title  Pt. will increase Berg balance test to >50/56 to improve safety/ decrease fall risk with daily tasks.      Baseline  11/24/2018 Berg 52/56    Time  4    Period  Weeks    Status  Achieved    Target Date  11/24/18      PT LONG TERM GOAL #3   Title  Pt. will increase B LE muscle strength 1/2 muscle grade to improve standing tolerance/ gait pattern.      Baseline  11/24/2018: B LE AROM WFL and L LE strength grossly 5/5 MMT except hip flexion 4+/5 MMT. R LE muscle strength grossly 4/5 MMT except hip flexion 4/5 MMT.    Time  4    Period  Weeks    Status  Partially Met    Target Date  12/31/18      PT LONG TERM GOAL #4   Title  Pt. able to ascend/ descend 15 stairs with recip. pattern and use of 1 handrail safely to be able to go to 2nd floor of house.      Baseline  Pt. able to ascend stair with recip. pattern but descends with step to pattern for safety.      Time  4    Period  Weeks    Status  Partially Met    Target Date  12/31/18         Plan - 12/07/18 1248    Clinical Impression Statement  Patient presents to clinic with excellent motivation to participate in therapy and had questions regarding discharge 2/2 to vacation; patient agreed to a trial home maintenance program while on vacation with a follow-up visit upon her return. Patient demonstrates deficits in BLE strength and mobility as evidenced by significant Trendelenburg as well as increased reliance on UE support. Patient able to achieve step through gait pattern on stairs during today's session and responded positively to educational interventions. Patient will benefit from continued skilled therapeutic intervention to address remaining deficits in strength and mobility  in order to decrease risk of falls, increase  function, and improve overall QOL.    Rehab Potential  Fair    PT Frequency  2x / week    PT Duration  4 weeks    PT Treatment/Interventions  ADLs/Self Care Home Management;Aquatic Therapy;Cryotherapy;Moist Heat;Gait training;Stair training;Functional mobility training;Neuromuscular re-education;Balance training;Therapeutic exercise;Therapeutic activities;Patient/family education;Manual techniques;Passive range of motion    PT Next Visit Plan  progress balance, LE strengthening discharge?    PT Home Exercise Plan  Access Code: [email or web address]  Consulted and Agree with Plan of Care  Patient       Patient will benefit from skilled therapeutic intervention in order to improve the following deficits and impairments:  Abnormal gait, Impaired sensation, Improper body mechanics, Pain, Postural dysfunction, Decreased mobility, Decreased activity tolerance, Decreased endurance, Decreased range of motion, Decreased strength, Hypomobility, Impaired flexibility, Difficulty walking, Decreased balance  Visit Diagnosis: S/P lumbar spinal fusion  Chronic low back pain, unspecified back pain laterality, unspecified whether sciatica present  Muscle weakness (generalized)  Gait difficulty     Problem List Patient Active Problem List   Diagnosis Date Noted  . Spondylolisthesis of lumbar region 05/04/2018  . Abnormal glucose level 02/12/2018  . Abnormal weight gain 02/12/2018  . Allergic rhinitis 02/12/2018  . Asthma 02/12/2018  . Bradycardia 02/12/2018  . Edema 02/12/2018  . Heart murmur 02/12/2018  . Hypercalcemia 02/12/2018  . Hypokalemia 02/12/2018  . Pure hypercholesterolemia 02/12/2018  . Shoulder joint pain 02/12/2018  . Skin sensation disturbance 02/12/2018  . Syncope 02/12/2018  . Vitamin D deficiency 02/12/2018  . 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 Muscle spasticity 04/24/2017  . Weight loss 12/19/2016  . Hypothyroidism 11/24/2016  .  Bilateral leg edema 11/24/2016  . Cervical radiculopathy 11/24/2016  . Disc degeneration, lumbar 11/24/2016  . Essential hypertension 11/24/2016  . Gallstones 11/24/2016  . History of anxiety state 11/24/2016  . Hyperlipidemia, unspecified 11/24/2016  . Spinal stenosis of lumbar region with neurogenic claudication 11/08/2016  . Anxiety 06/08/2015   Myles Gip PT, DPT [phone] 12/07/2018, 12:58 PM  [place] [address] [place], [place], [address] Phone: [phone]   Fax:  [phone]  Name: Charna Neeb MRN: [id number] Date of Birth: 30-Jul-1941

## 2018-12-07 NOTE — Patient Instructions (Signed)
Access Code: [email or web address]  URL: [email or web address]  Date: 12/07/2018  Prepared by: Sheria Lang   Exercises  Sitting Knee Extension with Resistance - 15 reps - 1 sets - 1x daily - 7x weekly  Seated Hip Abduction with Resistance - 15 reps - 1 sets - 1x daily - 7x weekly  Seated Hip Flexion - 15 reps - 1 sets - 1x daily - 7x weekly  Sit to Stand - 10 reps - 1 sets - 1x daily - 7x weekly

## 2018-12-09 ENCOUNTER — Ambulatory Visit: Payer: Medicare Other | Admitting: Physical Therapy

## 2018-12-24 ENCOUNTER — Ambulatory Visit: Payer: Medicare Other | Attending: Neurosurgery | Admitting: Physical Therapy

## 2018-12-24 ENCOUNTER — Encounter: Payer: Self-pay | Admitting: Physical Therapy

## 2018-12-24 DIAGNOSIS — R269 Unspecified abnormalities of gait and mobility: Secondary | ICD-10-CM | POA: Diagnosis present

## 2018-12-24 DIAGNOSIS — M6281 Muscle weakness (generalized): Secondary | ICD-10-CM | POA: Diagnosis present

## 2018-12-24 DIAGNOSIS — M545 Low back pain, unspecified: Secondary | ICD-10-CM

## 2018-12-24 DIAGNOSIS — Z981 Arthrodesis status: Secondary | ICD-10-CM

## 2018-12-24 DIAGNOSIS — G8929 Other chronic pain: Secondary | ICD-10-CM

## 2018-12-24 NOTE — Therapy (Signed)
[facility] [address] [place], [place], [address] Phone: [phone]   Fax:  [phone]  Physical Therapy Treatment  Patient Details  Name: Evelyn Cisneros MRN: [id number] Date of Birth: 11/27/1940 Referring Provider (PT): Dr. Newman Pies   Encounter Date: 12/24/2018  PT End of Session - 12/24/18 1738    Visit Number  14    Number of Visits  20    Date for PT Re-Evaluation  12/31/18    Authorization Type  3 of 10    PT Start Time  1108    PT Stop Time  1201    PT Time Calculation (min)  53 min    Activity Tolerance  Patient tolerated treatment well;Patient limited by fatigue    Behavior During Therapy  [facility] for tasks assessed/performed       Past Medical History:  Diagnosis Date  . Anemia   . Arthritis   . Heart murmur   . Hypertension   . Hypothyroidism   . Spondylolisthesis of lumbar region     Past Surgical History:  Procedure Laterality Date  . ABDOMINAL HYSTERECTOMY    . REPLACEMENT TOTAL KNEE BILATERAL    . SPINE SURGERY      There were no vitals filed for this visit.  Subjective Assessment - 12/24/18 1732    Subjective  Pt. returned from vacation to [place], [place] and [place] with no new complaints.  Pt. states she was active during trip and exhausted upon return to [facility].  No increase c/o pain or falls reported.      Patient is accompained by:  Family member    Pertinent History  Pt. reports fall 8 years ago causing increase low back pain/ issues.  Pt. s/p L TKA (10 years ago), R TKA (5 years ago).  Pt. has R grasping issues.      Limitations  Sitting;Lifting;Standing;Walking;House hold activities    Diagnostic tests  MRI/ X-rays    Patient Stated Goals  Increase core/ LE muscle strengthening and improve walking/ balance.      Currently in Pain?  No/denies        Treatment:  There.ex.:  Nustep L610 min. B UE/LE (no charge). Fwd/bkwd walking in //-bars 3 laps Side step in //-bars with increase  hip flexion/ abduction while maintaining midline 3 laps Seated LAQ 20x and hamstring stretches 3x with static holds/ neural glides Sit to stand 2x5 (no UE assist)   Neuro:  Tandem gait in //-bars 3 laps Stair: recip. Gait with 1 UE assist Stair taps to 6 inch step 15x2 (toe strike and heel strike (more difficult requiring light UE assist). Walking cone taps on agility ladder 3 laps with mod CGA assist from PT Airex balance: narrow BOS and SLB (pt. Unable to SLB on R LE)    PT Long Term Goals - 12/03/18 1202      PT LONG TERM GOAL #1   Title  Pt. will improve FOTO to 62 to improve functional mobility.      Baseline  11/24/2018: FOTO 82    Time  4    Period  Weeks    Status  Achieved    Target Date  11/24/18      PT LONG TERM GOAL #2   Title  Pt. will increase Berg balance test to >50/56 to improve safety/ decrease fall risk with daily tasks.      Baseline  11/24/2018 Berg 52/56    Time  4  Period  Weeks    Status  Achieved    Target Date  11/24/18      PT LONG TERM GOAL #3   Title  Pt. will increase B LE muscle strength 1/2 muscle grade to improve standing tolerance/ gait pattern.      Baseline  11/24/2018: B LE AROM WFL and L LE strength grossly 5/5 MMT except hip flexion 4+/5 MMT. R LE muscle strength grossly 4/5 MMT except hip flexion 4/5 MMT.    Time  4    Period  Weeks    Status  Partially Met    Target Date  12/31/18      PT LONG TERM GOAL #4   Title  Pt. able to ascend/ descend 15 stairs with recip. pattern and use of 1 handrail safely to be able to go to 2nd floor of house.      Baseline  Pt. able to ascend stair with recip. pattern but descends with step to pattern for safety.      Time  4    Period  Weeks    Status  Partially Met    Target Date  12/31/18         Plan - 12/24/18 1739    Clinical Impression Statement  Pt. continues to work hard on R LE strengthening/ independence and safety with gait.  Pt. continues to have superficial soft tissue  tenderness in R medial knee/ distal adductors.  Pt. has consistent R hip flexion/ heel strike noted with hallway walking with SPC.  Pt. able to ascend/ descend stairs with recip. pattern with no issues but requires UE assist on handrails.  No c/o low back pain reported during all standing ther.ex./ walking.      Stability/Clinical Decision Making  Stable/Uncomplicated    Clinical Decision Making  Low    Rehab Potential  Fair    PT Frequency  2x / week    PT Duration  4 weeks    PT Treatment/Interventions  ADLs/Self Care Home Management;Aquatic Therapy;Cryotherapy;Moist Heat;Gait training;Stair training;Functional mobility training;Neuromuscular re-education;Balance training;Therapeutic exercise;Therapeutic activities;Patient/family education;Manual techniques;Passive range of motion    PT Next Visit Plan  progress balance, LE strengthening.  Pt. has 1 more tx. session/ check goals (possible discharge).    PT Home Exercise Plan  Access Code: [email or web address]        Patient will benefit from skilled therapeutic intervention in order to improve the following deficits and impairments:  Abnormal gait, Impaired sensation, Improper body mechanics, Pain, Postural dysfunction, Decreased mobility, Decreased activity tolerance, Decreased endurance, Decreased range of motion, Decreased strength, Hypomobility, Impaired flexibility, Difficulty walking, Decreased balance  Visit Diagnosis: S/P lumbar spinal fusion  Chronic low back pain, unspecified back pain laterality, unspecified whether sciatica present  Muscle weakness (generalized)  Gait difficulty     Problem List Patient Active Problem List   Diagnosis Date Noted  . Spondylolisthesis of lumbar region 05/04/2018  . Abnormal glucose level 02/12/2018  . Abnormal weight gain 02/12/2018  . Allergic rhinitis 02/12/2018  . Asthma 02/12/2018  . Bradycardia 02/12/2018  . Edema 02/12/2018  . Heart murmur 02/12/2018  . Hypercalcemia 02/12/2018  .  Hypokalemia 02/12/2018  . Pure hypercholesterolemia 02/12/2018  . Shoulder joint pain 02/12/2018  . Skin sensation disturbance 02/12/2018  . Syncope 02/12/2018  . Vitamin D deficiency 02/12/2018  . 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 Muscle spasticity 04/24/2017  . Weight loss 12/19/2016  . Hypothyroidism 11/24/2016  . Bilateral leg edema  11/24/2016  . Cervical radiculopathy 11/24/2016  . Disc degeneration, lumbar 11/24/2016  . Essential hypertension 11/24/2016  . Gallstones 11/24/2016  . History of anxiety state 11/24/2016  . Hyperlipidemia, unspecified 11/24/2016  . Spinal stenosis of lumbar region with neurogenic claudication 11/08/2016  . Anxiety 06/08/2015   Pura Spice, PT, DPT # [id number] 12/24/2018, 5:44 PM  [place] [facility] [address] [place], [place], [address] Phone: [phone]   Fax:  [phone]  Name: Evelyn Cisneros MRN: [id number] Date of Birth: 1941/05/09

## 2018-12-29 ENCOUNTER — Encounter: Payer: Self-pay | Admitting: Physical Therapy

## 2018-12-29 ENCOUNTER — Ambulatory Visit: Payer: Medicare Other | Admitting: Physical Therapy

## 2018-12-29 DIAGNOSIS — R269 Unspecified abnormalities of gait and mobility: Secondary | ICD-10-CM

## 2018-12-29 DIAGNOSIS — M545 Low back pain, unspecified: Secondary | ICD-10-CM

## 2018-12-29 DIAGNOSIS — Z981 Arthrodesis status: Secondary | ICD-10-CM

## 2018-12-29 DIAGNOSIS — G8929 Other chronic pain: Secondary | ICD-10-CM

## 2018-12-29 DIAGNOSIS — M6281 Muscle weakness (generalized): Secondary | ICD-10-CM

## 2018-12-30 NOTE — Therapy (Signed)
[facility] [address] [place], [place], [address] Phone: [phone]   Fax:  [phone]  Physical Therapy Treatment  Patient Details  Name: Evelyn Cisneros MRN: [id number] Date of Birth: 07-Jan-1941 Referring Provider (PT): Dr. Newman Pies   Encounter Date: 12/29/2018  PT End of Session - 12/29/18 1308    Visit Number  15    Number of Visits  20    Date for PT Re-Evaluation  12/31/18    PT Start Time  2878    PT Stop Time  1355    PT Time Calculation (min)  59 min    Equipment Utilized During Treatment  Gait belt    Activity Tolerance  Patient tolerated treatment well;Patient limited by fatigue    Behavior During Therapy  [facility] for tasks assessed/performed       Past Medical History:  Diagnosis Date  . Anemia   . Arthritis   . Heart murmur   . Hypertension   . Hypothyroidism   . Spondylolisthesis of lumbar region     Past Surgical History:  Procedure Laterality Date  . ABDOMINAL HYSTERECTOMY    . REPLACEMENT TOTAL KNEE BILATERAL    . SPINE SURGERY      There were no vitals filed for this visit.  Subjective Assessment - 12/29/18 1255    Subjective  Pt. reports no complaints.  Pt. ready for discharge and states she is comfortable with current HEP.      Patient is accompained by:  Family member    Pertinent History  Pt. reports fall 8 years ago causing increase low back pain/ issues.  Pt. s/p L TKA (10 years ago), R TKA (5 years ago).  Pt. has R grasping issues.      Limitations  Sitting;Lifting;Standing;Walking;House hold activities    Diagnostic tests  MRI/ X-rays    Patient Stated Goals  Increase core/ LE muscle strengthening and improve walking/ balance.      Currently in Pain?  No/denies      Treatment:  There.ex.:  Nustep L610 min. B UE/LE (no charge). Fwd/bkwd walking in //-bars 3 laps Side step in //-bars with increase hip flexion/ abduction while maintaining midline 3 laps Seated LAQ 20x and  hamstring stretches 3x with static holds/ neural glides Sit to stand 2x5 (no UE assist)   Neuro:  Tandem gait in //-bars3 laps Stair: recip. Gait with 1 UE assist Step touches at 3 inch step 15x2 (toe strike)- focus on R hip flexion and preventing circumduction. Walking cone taps on agility ladder 3 laps with mod CGA assist from PT Airex step ups/ downs/ walk overs.  Tandem Airex: sidestepping L/R Turning in hallway CW/CCW    PT Long Term Goals - 12/30/18 1905      PT LONG TERM GOAL #3   Title  Pt. will increase B LE muscle strength 1/2 muscle grade to improve standing tolerance/ gait pattern.      Baseline  R LE muscle strength grossly 4/5 MMT (max potential)    Time  4    Period  Weeks    Status  Partially Met    Target Date  12/29/18      PT LONG TERM GOAL #4   Title  Pt. able to ascend/ descend 15 stairs with recip. pattern and use of 1 handrail safely to be able to go to 2nd floor of house.      Baseline  Recip. pattern in clinic but pt. has increase difficulty  at home due to position of handrail    Time  4    Period  Weeks    Status  Partially Met    Target Date  12/29/18            Plan - 12/29/18 1308    Clinical Impression Statement  Pt. has progressed well towards all PT goals.  Pt. is at max. physical status with skilled PT services at this time.  Pt. will continue to benefit from consistent walking/ HEP on a daily basis.  Pt. will focus on R hip flexion/ step pattern to maintain strength and independence with gait.  Discharge from PT at this time.      Stability/Clinical Decision Making  Stable/Uncomplicated    Clinical Decision Making  Low    Rehab Potential  Fair    PT Frequency  2x / week    PT Duration  4 weeks    PT Treatment/Interventions  ADLs/Self Care Home Management;Aquatic Therapy;Cryotherapy;Moist Heat;Gait training;Stair training;Functional mobility training;Neuromuscular re-education;Balance training;Therapeutic exercise;Therapeutic  activities;Patient/family education;Manual techniques;Passive range of motion    PT Next Visit Plan  Discharge    PT Home Exercise Plan  Access Code: [email or web address]        Patient will benefit from skilled therapeutic intervention in order to improve the following deficits and impairments:  Abnormal gait, Impaired sensation, Improper body mechanics, Pain, Postural dysfunction, Decreased mobility, Decreased activity tolerance, Decreased endurance, Decreased range of motion, Decreased strength, Hypomobility, Impaired flexibility, Difficulty walking, Decreased balance  Visit Diagnosis: S/P lumbar spinal fusion  Chronic low back pain, unspecified back pain laterality, unspecified whether sciatica present  Muscle weakness (generalized)  Gait difficulty     Problem List Patient Active Problem List   Diagnosis Date Noted  . Spondylolisthesis of lumbar region 05/04/2018  . Abnormal glucose level 02/12/2018  . Abnormal weight gain 02/12/2018  . Allergic rhinitis 02/12/2018  . Asthma 02/12/2018  . Bradycardia 02/12/2018  . Edema 02/12/2018  . Heart murmur 02/12/2018  . Hypercalcemia 02/12/2018  . Hypokalemia 02/12/2018  . Pure hypercholesterolemia 02/12/2018  . Shoulder joint pain 02/12/2018  . Skin sensation disturbance 02/12/2018  . Syncope 02/12/2018  . Vitamin D deficiency 02/12/2018  . 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 Muscle spasticity 04/24/2017  . Weight loss 12/19/2016  . Hypothyroidism 11/24/2016  . Bilateral leg edema 11/24/2016  . Cervical radiculopathy 11/24/2016  . Disc degeneration, lumbar 11/24/2016  . Essential hypertension 11/24/2016  . Gallstones 11/24/2016  . History of anxiety state 11/24/2016  . Hyperlipidemia, unspecified 11/24/2016  . Spinal stenosis of lumbar region with neurogenic claudication 11/08/2016  . Anxiety 06/08/2015   [name], PT, DPT # [id number] 12/30/2018, 7:07 PM  [facility] [place]  [facility] [address] [place], [place], [address] Phone: [phone]   Fax:  [phone]  Name: Evelyn Cisneros MRN: [id number] Date of Birth: 12/21/1940   

## 2019-04-12 IMAGING — RF DG LUMBAR SPINE 2-3V
1 series · 2 of 2 positions shown · non-contrast
Comparison: Intraoperative images at 3921 hours today. Outside
lumbar MRI 01/18/2018. outside lumbar radiographs 01/18/2018.

CLINICAL DATA: 77-year-old female undergoing lumbar surgery.

EXAM:
DG C-ARM 61-120 MIN; LUMBAR SPINE - 2-3 VIEW

[Series 1: run · 2 of 2 slices shown]
[im 1/2]
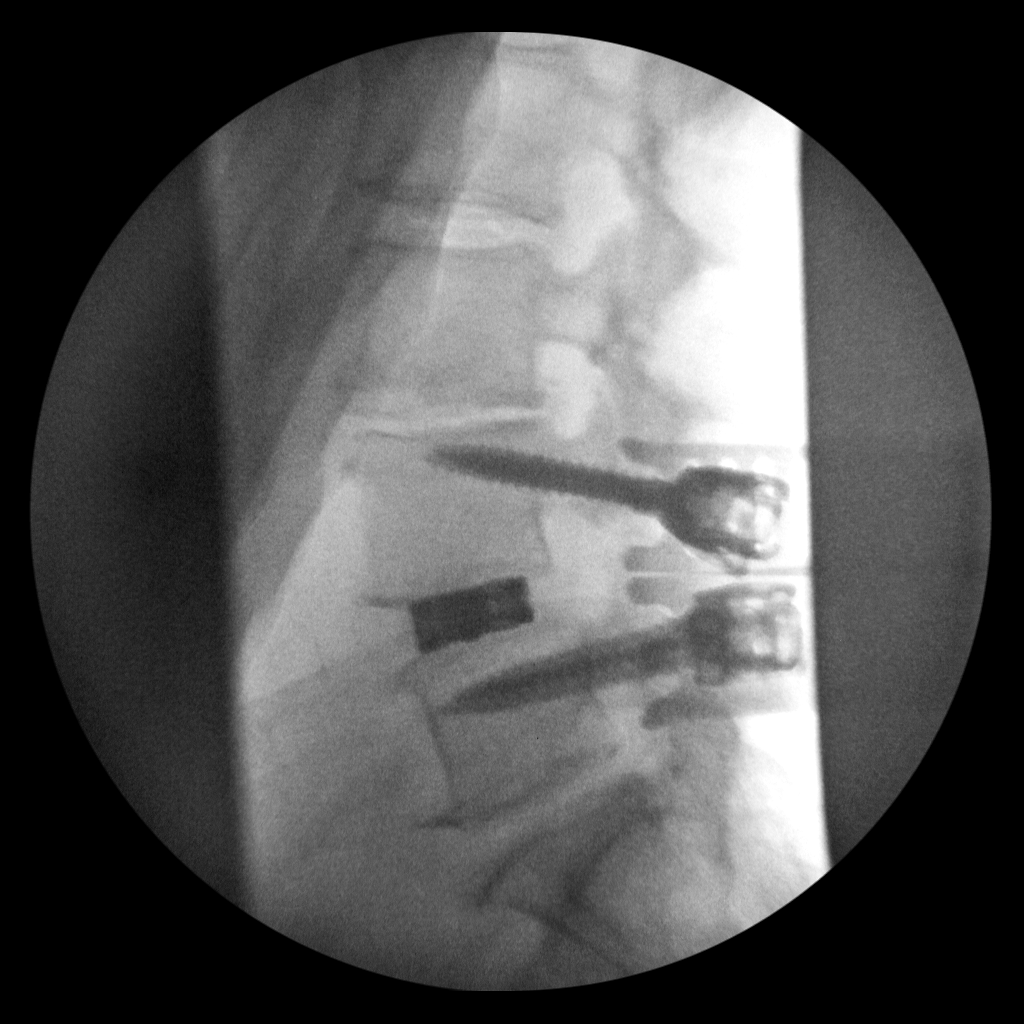
[im 2/2]
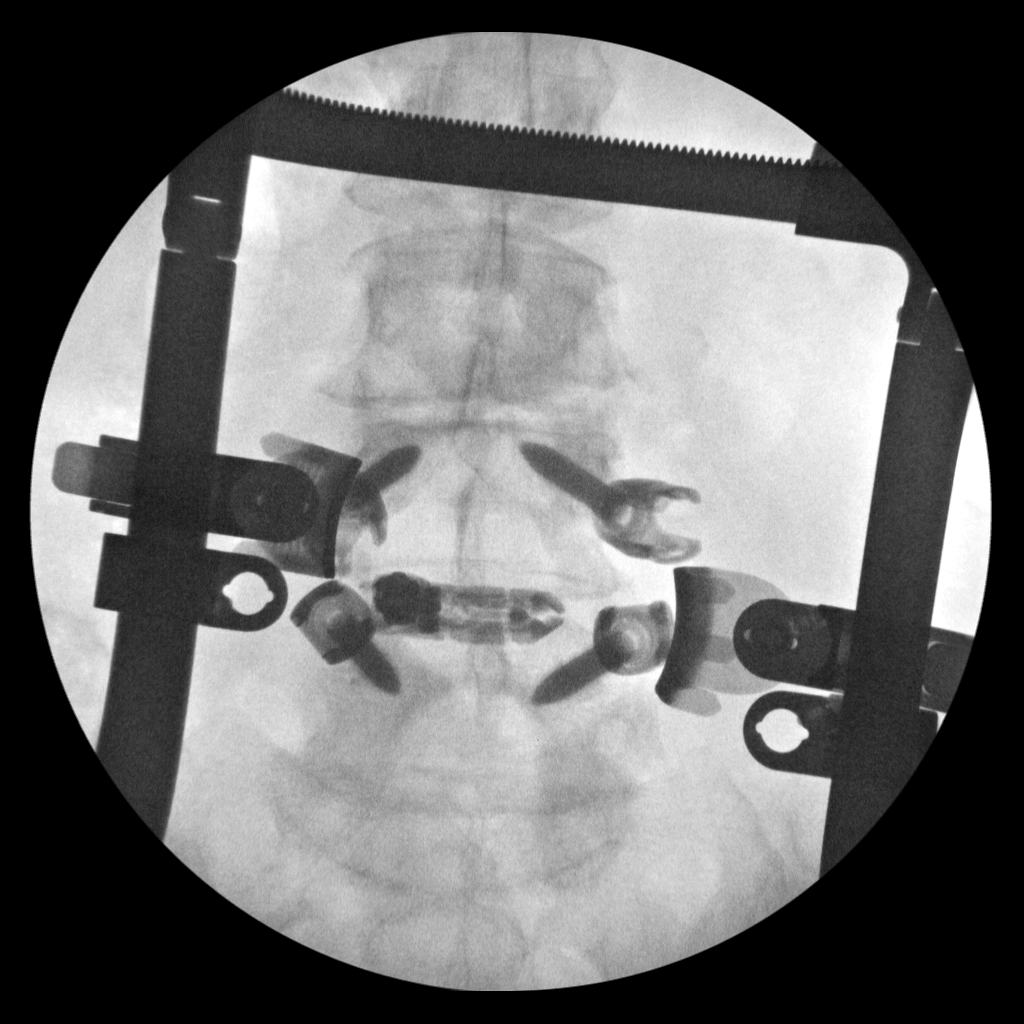

[2 of 2 positions shown; findings below may reference images not displayed]

FINDINGS: Two intraoperative fluoroscopic spot views of the lower lumbar spine
in the AP and lateral projections. Normal lumbar segmentation noted
on 01/18/2018, the same numbering system used on the intraoperative
image today. These images demonstrate posterior and interbody fusion
hardware placement at L4-L5. Posterior connecting rods are not yet
in place.

FLUOROSCOPY TIME:  0 minutes 22 seconds
IMPRESSION: Posterior and interbody fusion underway at L4-L5.

## 2019-04-12 IMAGING — CR DG LUMBAR SPINE 1V
1 series · 1 of 1 positions shown · non-contrast
Comparison: Lumbar spine radiographs 01/18/2018

CLINICAL DATA: L4-5 PLIF

EXAM:
LUMBAR SPINE - 1 VIEW

[lateral]
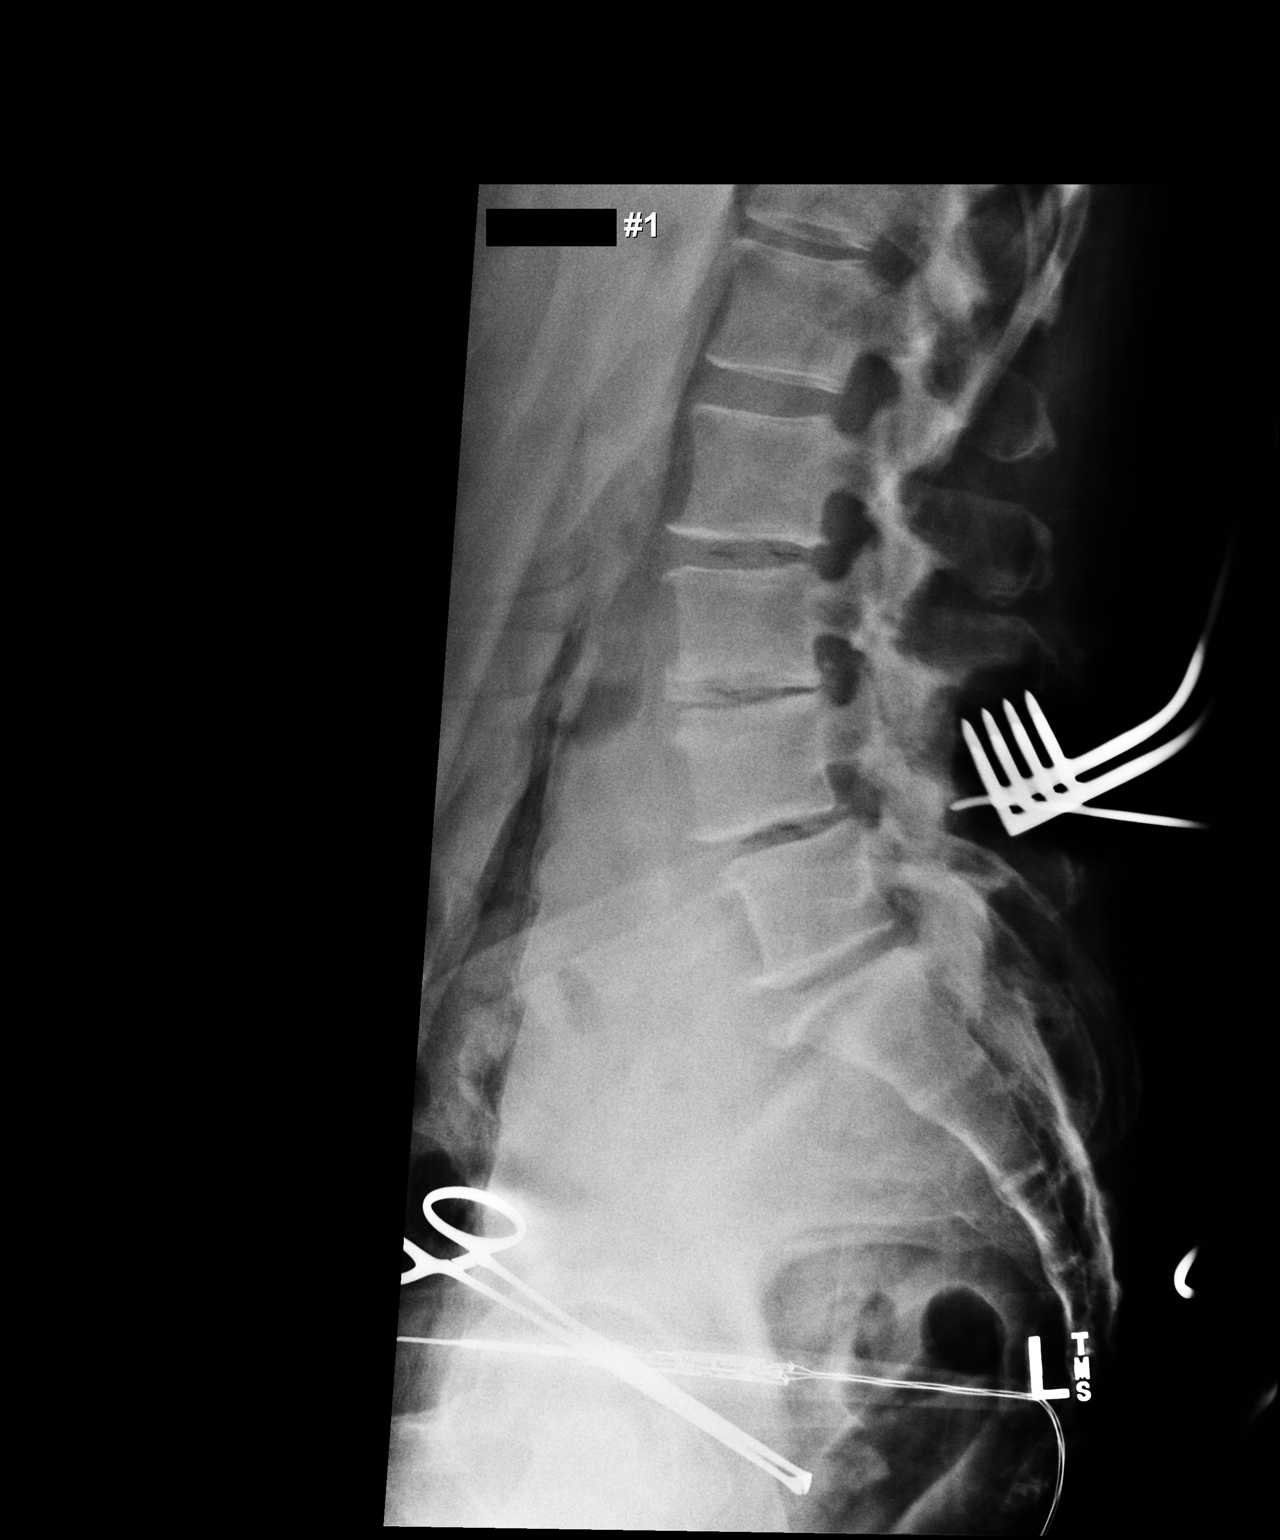

[1 of 1 positions shown; findings below may reference images not displayed]

FINDINGS: Five lumbarized vertebral bodies are counted. Lowest disc space
L5-S1

Grade 1 anterolisthesis L4-5. Disc degeneration and spurring L2
through S1

Surgical instruments are located in the posterior soft tissues at
the L4-5 level. Surgical instrument posterior to the lamina at L4-5
IMPRESSION: Surgical localization at the L4-5 level.

## 2019-06-03 ENCOUNTER — Other Ambulatory Visit: Payer: Self-pay | Admitting: Family Medicine

## 2019-06-03 ENCOUNTER — Encounter (INDEPENDENT_AMBULATORY_CARE_PROVIDER_SITE_OTHER): Payer: Self-pay

## 2019-06-03 ENCOUNTER — Ambulatory Visit
Admission: RE | Admit: 2019-06-03 | Discharge: 2019-06-03 | Disposition: A | Discharge status: Home / Self Care | Payer: Medicare Other | Source: Ambulatory Visit | Attending: Family Medicine | Admitting: Family Medicine

## 2019-06-03 ENCOUNTER — Other Ambulatory Visit: Payer: Self-pay

## 2019-06-03 DIAGNOSIS — S7011XA Contusion of right thigh, initial encounter: Secondary | ICD-10-CM | POA: Diagnosis not present

## 2019-06-03 DIAGNOSIS — W19XXXA Unspecified fall, initial encounter: Secondary | ICD-10-CM | POA: Diagnosis not present

## 2019-06-03 DIAGNOSIS — E039 Hypothyroidism, unspecified: Secondary | ICD-10-CM | POA: Insufficient documentation

## 2019-06-03 DIAGNOSIS — M7989 Other specified soft tissue disorders: Secondary | ICD-10-CM | POA: Diagnosis present

## 2020-05-11 IMAGING — US RIGHT LOWER EXTREMITY VENOUS ULTRASOUND
1 series · 13 of 24 positions shown · non-contrast
Comparison: None.

CLINICAL DATA: 78-year-old female with right lower extremity
hematoma after falling. Evaluate for DVT.



[Series 1: right lower extremity venous ultrasound · 0.07mm/px · 13 of 37 slices shown]
[im 1/37]
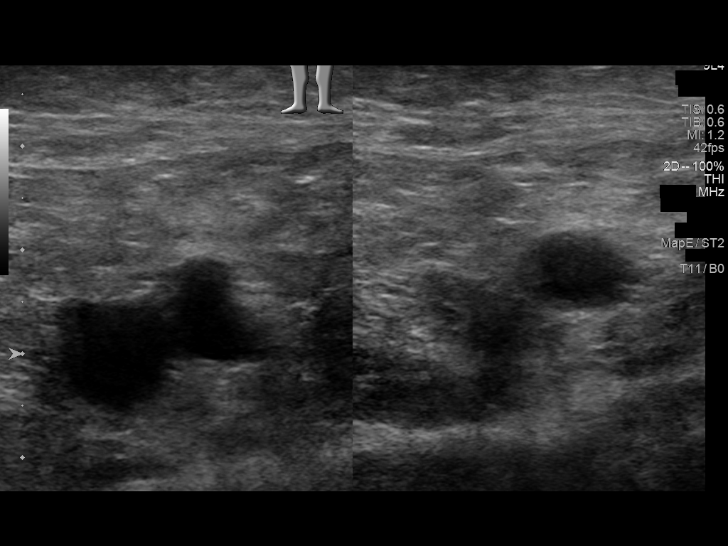
[im 4/37]
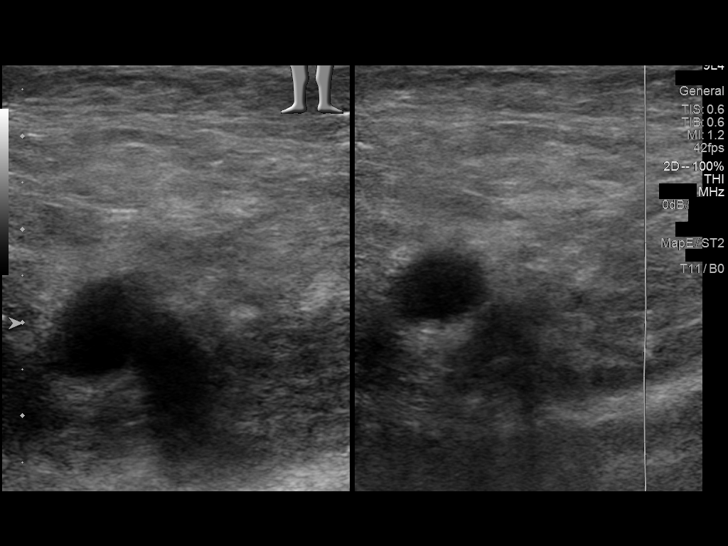
[im 7/37]
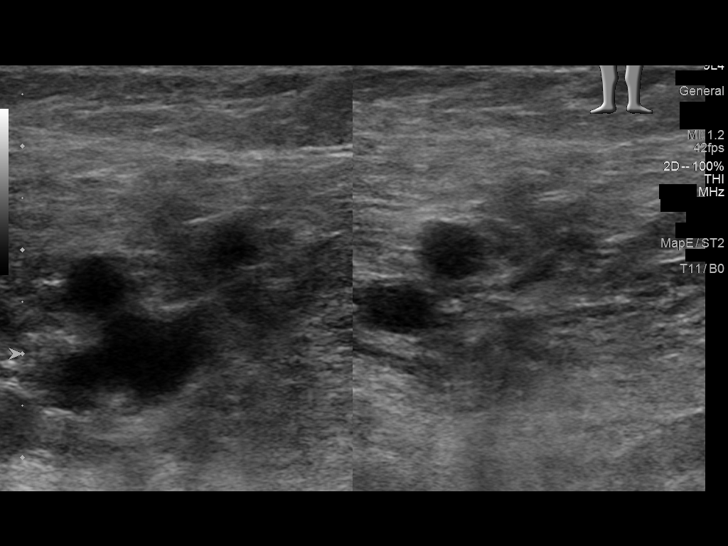
[im 10/37]
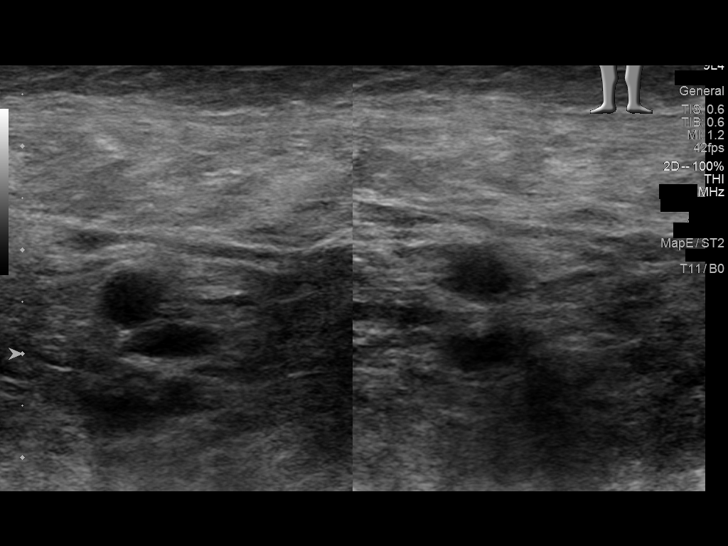
[im 13/37]
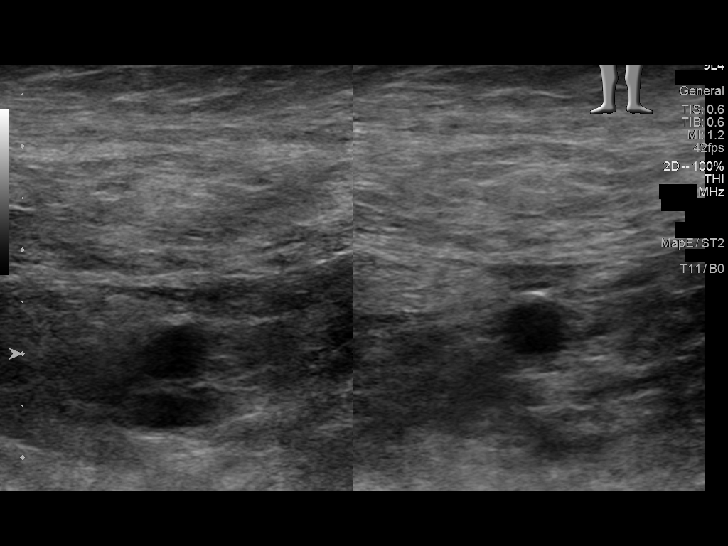
[im 16/37]
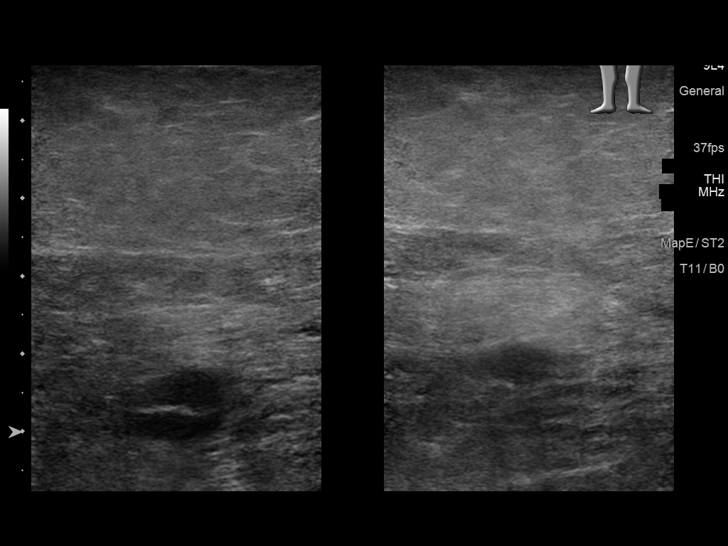
[im 19/37]
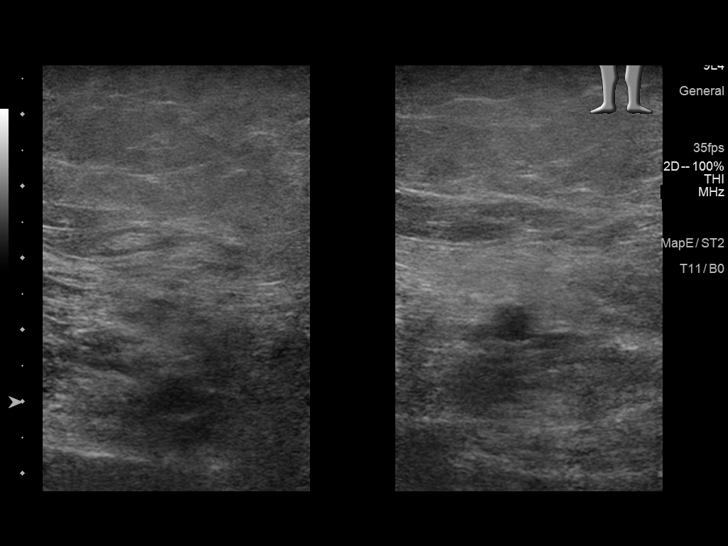
[im 21/37]
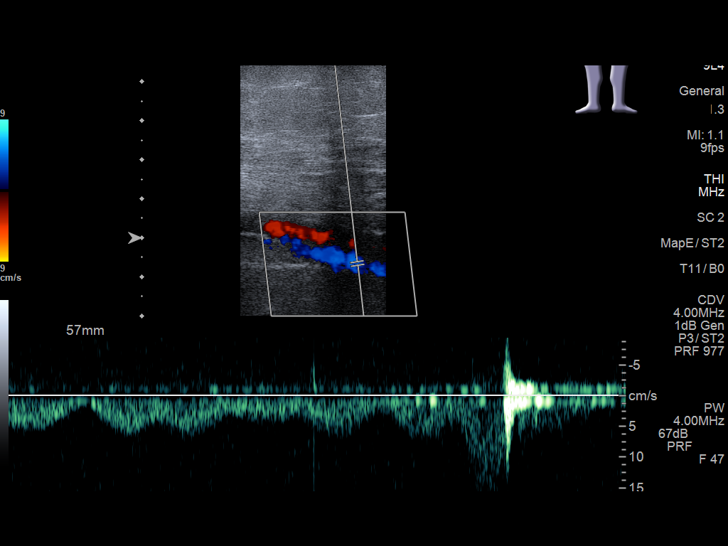
[im 24/37]
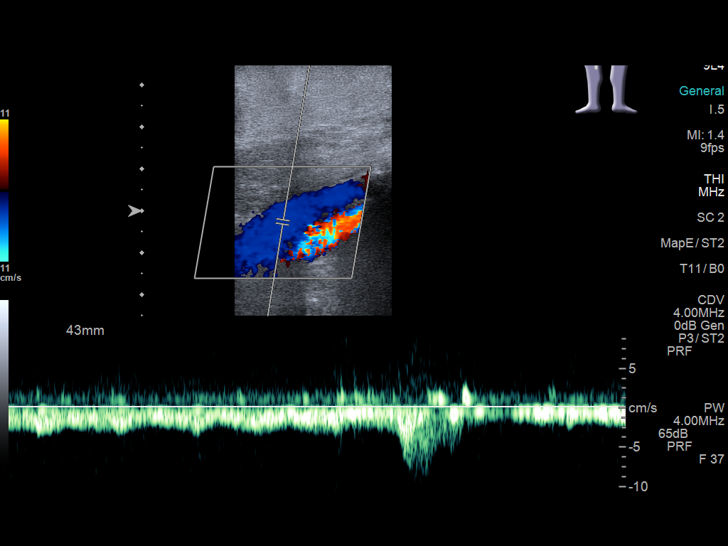
[im 27/37]
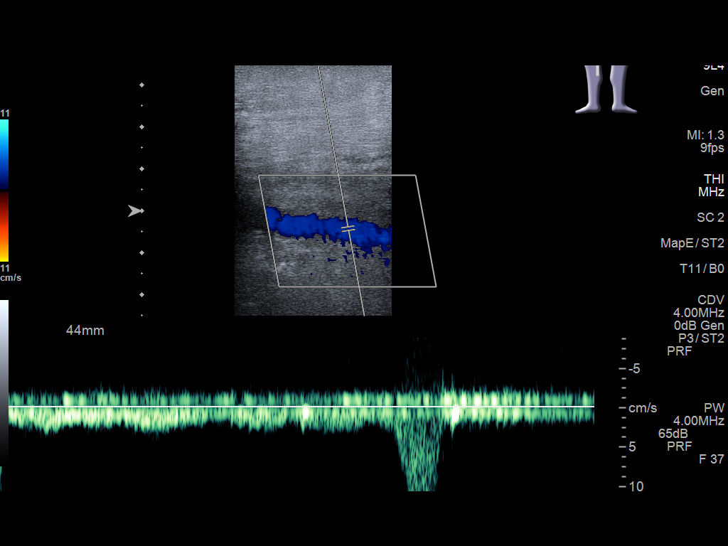
[im 30/37]
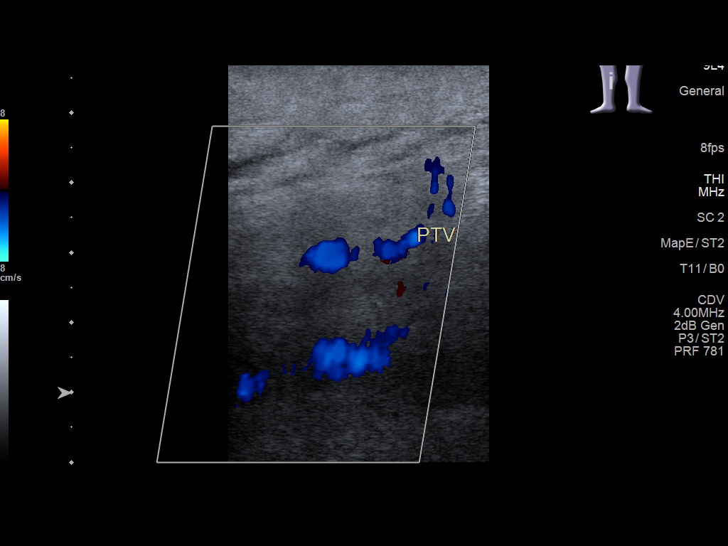
[im 33/37]
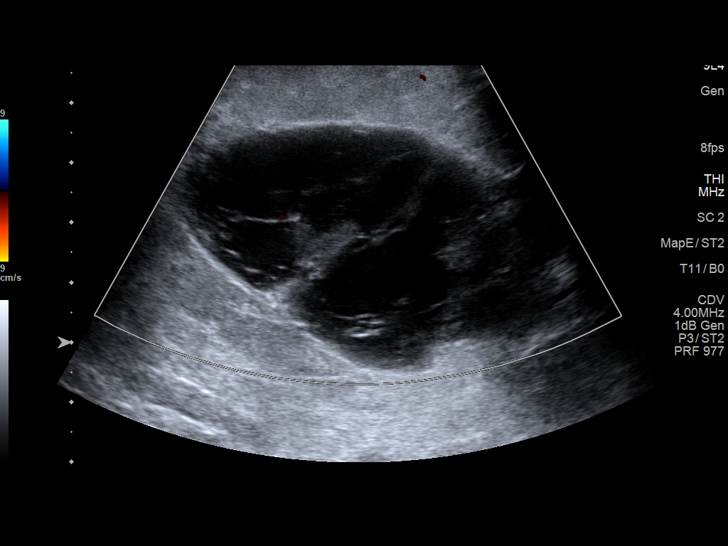
[im 37/37]
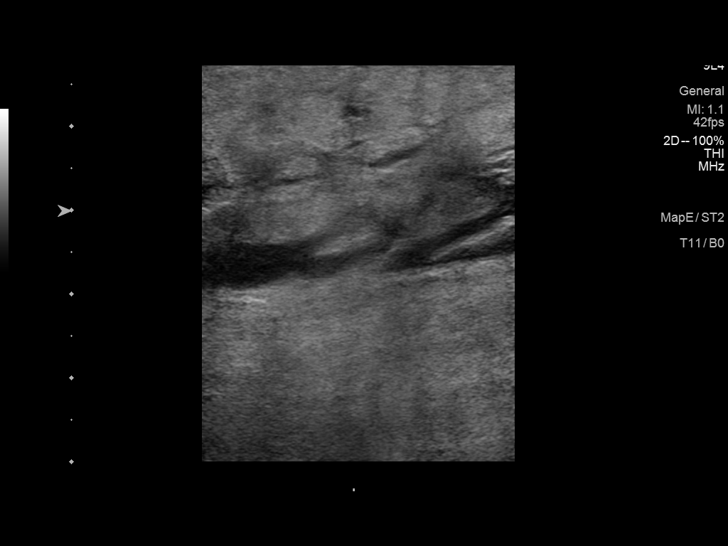

[13 of 24 positions shown; findings below may reference images not displayed]

FINDINGS: Contralateral Common Femoral Vein: Respiratory phasicity is normal
and symmetric with the symptomatic side. No evidence of thrombus.
Normal compressibility. Increased pulsatility of the venous
waveform.

Common Femoral Vein: No evidence of thrombus. Normal
compressibility, respiratory phasicity and response to augmentation.
Increased pulsatility of the venous waveform.

Saphenofemoral Junction: No evidence of thrombus. Normal
compressibility and flow on color Doppler imaging.

Profunda Femoral Vein: No evidence of thrombus. Normal
compressibility and flow on color Doppler imaging.

Femoral Vein: No evidence of thrombus. Normal compressibility,
respiratory phasicity and response to augmentation.

Popliteal Vein: No evidence of thrombus. Normal compressibility,
respiratory phasicity and response to augmentation.

Calf Veins: No evidence of thrombus. Normal compressibility and flow
on color Doppler imaging.

Superficial Great Saphenous Vein: No evidence of thrombus. Normal
compressibility.

Venous Reflux:  None.

Other Findings: Complex cystic mass in the distal thigh in the
region of bruising. The fluid collection measures approximately
x 3.8 cm. No evidence of internal flow on color Doppler imaging.
IMPRESSION: 1. No evidence of deep venous thrombosis.
2. Increased pulsatility of the venous waveforms suggests elevated
right heart pressures.
3. Approximately 8.1 x 3.8 cm hematoma in the superficial
subcutaneous fat of the distal medial thigh.

## 2020-06-27 ENCOUNTER — Encounter: Payer: Self-pay | Admitting: Physical Therapy

## 2020-06-27 ENCOUNTER — Other Ambulatory Visit: Payer: Self-pay

## 2020-06-27 ENCOUNTER — Ambulatory Visit: Payer: Medicare Other | Attending: Family Medicine | Admitting: Physical Therapy

## 2020-06-27 DIAGNOSIS — M6281 Muscle weakness (generalized): Secondary | ICD-10-CM | POA: Diagnosis not present

## 2020-06-27 DIAGNOSIS — Z9181 History of falling: Secondary | ICD-10-CM | POA: Insufficient documentation

## 2020-06-27 DIAGNOSIS — R269 Unspecified abnormalities of gait and mobility: Secondary | ICD-10-CM | POA: Insufficient documentation

## 2020-06-28 NOTE — Therapy (Signed)
[facility] [address] [place], [place], [address] Phone: [phone]   Fax:  [phone]  Physical Therapy Evaluation  Patient Details  Name: Evelyn Cisneros MRN: [id number] Date of Birth: 18-Aug-1941 Referring Provider (PT): Damaris Schooner, DO   Encounter Date: 06/27/2020   PT End of Session - 06/27/20 1224    Visit Number 1    Number of Visits 9    Date for PT Re-Evaluation 07/25/20    Authorization - Visit Number 1    Authorization - Number of Visits 10    PT Start Time 1115    PT Stop Time 1213    PT Time Calculation (min) 58 min    Equipment Utilized During Treatment Gait belt    Activity Tolerance Patient tolerated treatment well    Behavior During Therapy [facility] for tasks assessed/performed           Past Medical History:  Diagnosis Date  . Anemia   . Arthritis   . Heart murmur   . Hypertension   . Hypothyroidism   . Spondylolisthesis of lumbar region     Past Surgical History:  Procedure Laterality Date  . ABDOMINAL HYSTERECTOMY    . REPLACEMENT TOTAL KNEE BILATERAL    . SPINE SURGERY      There were no vitals filed for this visit.    Subjective Assessment - 06/27/20 1217    Subjective Pt is a pleasant 79 y.o. woman referred to PT for gait and balance instability. Pt familiar with PT in the past and has found it beneficial. Pt reports due to covid, she has not been as active and has caused her to have some BLE weakness and balance impairments. Pt reports a fall last week tripping over a chair leg stepping backwards. Prior to this fall, she has not fallen since July 5th. No serious injuries have occured from falls. Pt reports using a SPC for all ambulatory tasks. Pt wishes to improve her BLE strength, balance, and walking. Pt also wishes to be able to stand and walk for longer bouts of time. Pt has history of Cervical fusion due to cervical fracture causing R sided residual weakness with R hand contracture.     Limitations Lifting;Standing;Walking;House hold activities    How long can you stand comfortably? 10-15 min    How long can you walk comfortably? 1-2 blocks    Patient Stated Goals Reduce falls, improve BLE strength, improve balance and walking.    Currently in Pain? No/denies    Pain Score 0-No pain              [facility] PT Assessment - 06/27/20 0001      Assessment   Medical Diagnosis Gait Instability    Referring Provider (PT) Damaris Schooner, DO    Onset Date/Surgical Date 10/22/19    Prior Therapy yes, see notes at Main rehab      Precautions   Precautions Fall      Balance Screen   Has the patient fallen in the past 6 months Yes    How many times? 2    Has the patient had a decrease in activity level because of a fear of falling?  Yes    Is the patient reluctant to leave their home because of a fear of falling?  No      Home Tourist information centre manager residence      Prior Function   Level of Independence Independent  Vocation Retired      Scientist, physiological Index           OBJECTIVE   Mental Status Patient's fund of knowledge is within normal limits for educational level.   SENSATION: Grossly intact to light touch bilateral LE as determined by testing dermatomes L2-S2 Proprioception and hot/cold testing deferred on this date   MUSCULOSKELETAL: Tremor: None Bulk: Normal Tone: Normal   Posture Pt stands with rounded shoulders and forward head posture.   Gait Patient ambulates with decreased RLE step length and hip extension due to significant, chronic RLE muscle weakness from previous cervical injury. Pt ambulates with SPC.  Strength (out of 5) R/L 3+/4+ Hip flexion 4/5 Hip abduction (seated) 4/5 Hip adduction (seated) 4/5 Knee extension 4/5 Knee flexion 4/4 Ankle dorsiflexion *Indicates pain     SPECIAL TESTS Berg: 38/56 DGI: 14/24 5xSTS: 14.68 sec FOTO: 43/56      Outpatient Rehab from 04/01/2018 in [facility]  Lymphedema Life Impact Scale Total Score 23.53 %      Objective measurements completed on examination: See above findings.               PT Education - 06/28/20 0719    Education Details Educated on HEP. Form/technique with exercises    Person(s) Educated Patient;Spouse    Methods Explanation;Demonstration;Handout    Comprehension Verbalized understanding               PT Long Term Goals - 06/28/20 0720      PT LONG TERM GOAL #1   Title Pt will imporve FOTO score to 56 to demonstrate perceived improvements in functional mobility.    Baseline 9/7: 43    Time 4    Period Weeks    Status New    Target Date 07/25/20      PT LONG TERM GOAL #2   Title Pt will improve BERG balance test to >52/56 to reduce risk of falls with ADL's.    Baseline 9/7: 38/56    Time 4    Period Weeks    Status New    Target Date 07/25/20      PT LONG TERM GOAL #3   Title Pt will improve DGI score to > 22/24 to demonstrate reduced risk of falls with daily ambulation tasks.    Baseline 9/7: 14/24    Time 4    Period Weeks    Status New    Target Date 07/25/20      PT LONG TERM GOAL #4   Title Pt will improve 5xSTS score to < 12.0 sec to demonstrate clinically significant increase in BLE strength to improve functional mobility.    Baseline 9/7: 14.68 sec    Time 4    Period Weeks    Status New    Target Date 07/25/20      PT LONG TERM GOAL #5   Title Pt will asc/desc 4 stairs with reciprocal gait pattern and single hand rail use with 50% reduction of R hip hike with asc to improve ability to asc stairs safely.    Baseline 9/7: Reciprocal pattern with B handrail use and R hip hike with asc    Time 4    Period Weeks    Status New    Target Date 07/25/20                  Plan - 06/27/20 1246  Clinical Impression Statement Pt is a pleasant 79 y.o. female referred to PT for gait  instability. Pt has PMH of cervical practure causing R sided weakness in UE and LE. At baseline pt ambulates with a SPC. Today, her R LE strength is significantly weaker compared to LLE. Pt displayed 4/5 MMT with gross LE strength on R and 5/5 MMT in seated on L. Pt scored a 38/56 on BERG and 14/24 on DGI placing pt at increased risk of falls. Pt asc/desc stairs at a reciprocal pattern with B handrail use and leads with LLE asc and desc. With asc pt demonstrates R hip hike due to weakness. With amb pt has decreased step length on RLE and decreased stance time on R making it appear pt has an antalgic gait pattern. Pt has limited 5xSTS time of 14.68 sec. Pt can continue to benefit from skilled PT treatment to improve BLE strength, balance, and gait to reduce risk of falls.    Personal Factors and Comorbidities Age;Comorbidity 2;Fitness    Examination-Activity Limitations Transfers;Bend;Lift;Squat;Stairs;Locomotion Level;Carry;Stand    Examination-Participation Restrictions Cleaning;Community Activity;Other    Stability/Clinical Decision Making Stable/Uncomplicated    Clinical Decision Making Low    Rehab Potential Fair    PT Frequency 2x / week    PT Duration 4 weeks    PT Treatment/Interventions ADLs/Self Care Home Management;Aquatic Therapy;Cryotherapy;Moist Heat;Gait training;Stair training;Functional mobility training;Neuromuscular re-education;Balance training;Therapeutic exercise;Therapeutic activities;Patient/family education;Manual techniques;Passive range of motion    PT Next Visit Plan Reassess HEP    PT Home Exercise Plan STS, SLS and tandem stance at counter. Standing marches at [employer] and Agree with Plan of Care Patient           Patient will benefit from skilled therapeutic intervention in order to improve the following deficits and impairments:  Abnormal gait, Impaired sensation, Improper body mechanics, Pain, Postural dysfunction, Decreased mobility, Decreased activity  tolerance, Decreased endurance, Decreased range of motion, Decreased strength, Hypomobility, Impaired flexibility, Difficulty walking, Decreased balance  Visit Diagnosis: Muscle weakness (generalized)  Gait difficulty  Right-sided muscle weakness  At risk for falls     Problem List Patient Active Problem List   Diagnosis Date Noted  . Spondylolisthesis of lumbar region 05/04/2018  . Abnormal glucose level 02/12/2018  . Abnormal weight gain 02/12/2018  . Allergic rhinitis 02/12/2018  . Asthma 02/12/2018  . Bradycardia 02/12/2018  . Edema 02/12/2018  . Heart murmur 02/12/2018  . Hypercalcemia 02/12/2018  . Hypokalemia 02/12/2018  . Pure hypercholesterolemia 02/12/2018  . Shoulder joint pain 02/12/2018  . Skin sensation disturbance 02/12/2018  . Syncope 02/12/2018  . Vitamin D deficiency 02/12/2018  . 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 Muscle spasticity 04/24/2017  . Weight loss 12/19/2016  . Hypothyroidism 11/24/2016  . Bilateral leg edema 11/24/2016  . Cervical radiculopathy 11/24/2016  . Disc degeneration, lumbar 11/24/2016  . Essential hypertension 11/24/2016  . Gallstones 11/24/2016  . History of anxiety state 11/24/2016  . Hyperlipidemia, unspecified 11/24/2016  . Spinal stenosis of lumbar region with neurogenic claudication 11/08/2016  . Anxiety 06/08/2015   Cammie Mcgee, PT, DPT # [id number] SPT 06/28/2020, 7:27 AM  [place] [facility] [address] [place], [place], [address] Phone: [phone]   Fax:  [phone]  Name: Evelyn Cisneros MRN: [id number] Date of Birth: 21-Oct-1941

## 2020-06-29 ENCOUNTER — Ambulatory Visit: Payer: Medicare Other

## 2020-06-29 ENCOUNTER — Other Ambulatory Visit: Payer: Self-pay

## 2020-06-29 ENCOUNTER — Encounter: Payer: Self-pay | Admitting: Physical Therapy

## 2020-06-29 DIAGNOSIS — M6281 Muscle weakness (generalized): Secondary | ICD-10-CM | POA: Diagnosis not present

## 2020-06-29 DIAGNOSIS — R269 Unspecified abnormalities of gait and mobility: Secondary | ICD-10-CM

## 2020-06-29 DIAGNOSIS — Z9181 History of falling: Secondary | ICD-10-CM

## 2020-06-29 NOTE — Therapy (Addendum)
[facility] [address] [place], [place], [address] Phone: [phone]   Fax:  [phone]  Physical Therapy Treatment  Patient Details  Name: Evelyn Cisneros MRN: [id number] Date of Birth: [birth year] Referring Provider (PT): Damaris Schooner, [place]   Encounter Date: 06/29/2020   PT End of Session - 06/29/20 1015    Visit Number 2    Number of Visits 9    Date for PT Re-Evaluation 07/25/20    Authorization - Visit Number 2    Authorization - Number of Visits 10    PT Start Time 1014    Equipment Utilized During Treatment Gait belt    Activity Tolerance Patient tolerated treatment well    Behavior During Therapy [facility] for tasks assessed/performed           Past Medical History:  Diagnosis Date  . Anemia   . Arthritis   . Heart murmur   . Hypertension   . Hypothyroidism   . Spondylolisthesis of lumbar region     Past Surgical History:  Procedure Laterality Date  . ABDOMINAL HYSTERECTOMY    . REPLACEMENT TOTAL KNEE BILATERAL    . SPINE SURGERY      There were no vitals filed for this visit.   Subjective Assessment - 06/29/20 1019    Subjective Pt reports LBP with standing marches at home. Compliant with HEP. No falls reported since Tuesday.    Limitations Lifting;Standing;Walking;House hold activities    How long can you stand comfortably? 10-15 min    How long can you walk comfortably? 1-2 blocks    Patient Stated Goals Reduce falls, improve BLE strength, improve balance and walking.    Currently in Pain? No/denies    Pain Score 0-No pain           There.ex:   Nu-Step L2 for 8 min. LE's only. Intermittent use of LUE.   Standing marching at // bars: One leg at a time. 2x12 verbal and tactile cues. No LBP with modified form/technique   Side steps with marches: x4.   Sit to stand: 2x10, verbal cues for eccentric control. Required LUE support on second set due to fatigue.   Neuro Re-Ed:   Tandem walking in // bars: x4  BUE's, x4 LUE, X4 2 finger touch on LUE. Verbal and tactile cues for reducing looking at feet and to look straight ahead. No LOB. CGA+1  Alternating cone taps on 3" step: 1x10 with BUE's. Verbal and tactile cues for decreasing speed for improved quality of movement. 1x10 with SPC. Improved form/technique and speed of movement. CGA+1     Caregiver education on techniques to assist pt if she were to fall at home.      PT Long Term Goals - 06/28/20 0720      PT LONG TERM GOAL #1   Title Pt will imporve FOTO score to 56 to demonstrate perceived improvements in functional mobility.    Baseline 9/7: 43    Time 4    Period Weeks    Status New    Target Date 07/25/20      PT LONG TERM GOAL #2   Title Pt will improve BERG balance test to >52/56 to reduce risk of falls with ADL's.    Baseline 9/7: 38/56    Time 4    Period Weeks    Status New    Target Date 07/25/20      PT LONG TERM GOAL #3   Title Pt will  improve DGI score to > 22/24 to demonstrate reduced risk of falls with daily ambulation tasks.    Baseline 9/7: 14/24    Time 4    Period Weeks    Status New    Target Date 07/25/20      PT LONG TERM GOAL #4   Title Pt will improve 5xSTS score to < 12.0 sec to demonstrate clinically significant increase in BLE strength to improve functional mobility.    Baseline 9/7: 14.68 sec    Time 4    Period Weeks    Status New    Target Date 07/25/20      PT LONG TERM GOAL #5   Title Pt will asc/desc 4 stairs with reciprocal gait pattern and single hand rail use with 50% reduction of R hip hike with asc to improve ability to asc stairs safely.    Baseline 9/7: Reciprocal pattern with B handrail use and R hip hike with asc    Time 4    Period Weeks    Status New    Target Date 07/25/20                 06/29/20 1106  Plan  Clinical Impression Statement Due to reports of LBP with standing marches, they were reassessed. Pt educated on modifying form/technique with improved  carryover with no LBP. Pt demonstrates difficulty with SLS and performing R hip flexion activities due to hip flexor weakness. Pt demonstrates good carryover with tandem walking with LUE 2 finger support with no LOB or sway. Pt can continue to benefit from skilled PT treatment to reduce risk of falls and improve functional mobility.  Personal Factors and Comorbidities Age;Comorbidity 2;Fitness  Examination-Activity Limitations Transfers;Bend;Lift;Squat;Stairs;Locomotion Level;Carry;Stand  Examination-Participation Restrictions Cleaning;Community Activity;Other  Pt will benefit from skilled therapeutic intervention in order to improve on the following deficits Abnormal gait;Impaired sensation;Improper body mechanics;Pain;Postural dysfunction;Decreased mobility;Decreased activity tolerance;Decreased endurance;Decreased range of motion;Decreased strength;Hypomobility;Impaired flexibility;Difficulty walking;Decreased balance  Stability/Clinical Decision Making Stable/Uncomplicated  Rehab Potential Fair  PT Frequency 2x / week  PT Duration 4 weeks  PT Treatment/Interventions ADLs/Self Care Home Management;Aquatic Therapy;Cryotherapy;Moist Heat;Gait training;Stair training;Functional mobility training;Neuromuscular re-education;Balance training;Therapeutic exercise;Therapeutic activities;Patient/family education;Manual techniques;Passive range of motion  PT Next Visit Plan Reassess HEP  PT Home Exercise Plan STS, SLS and tandem stance at counter. Standing marches at [employer] and Agree with Plan of Care Patient     Visit Diagnosis: Muscle weakness (generalized)  Gait difficulty  Right-sided muscle weakness  At risk for falls     Problem List Patient Active Problem List   Diagnosis Date Noted  . Spondylolisthesis of lumbar region 05/04/2018  . Abnormal glucose level 02/12/2018  . Abnormal weight gain 02/12/2018  . Allergic rhinitis 02/12/2018  . Asthma 02/12/2018  . Bradycardia  02/12/2018  . Edema 02/12/2018  . Heart murmur 02/12/2018  . Hypercalcemia 02/12/2018  . Hypokalemia 02/12/2018  . Pure hypercholesterolemia 02/12/2018  . Shoulder joint pain 02/12/2018  . Skin sensation disturbance 02/12/2018  . Syncope 02/12/2018  . Vitamin D deficiency 02/12/2018  . 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 Muscle spasticity 04/24/2017  . Weight loss 12/19/2016  . Hypothyroidism 11/24/2016  . Bilateral leg edema 11/24/2016  . Cervical radiculopathy 11/24/2016  . Disc degeneration, lumbar 11/24/2016  . Essential hypertension 11/24/2016  . Gallstones 11/24/2016  . History of anxiety state 11/24/2016  . Hyperlipidemia, unspecified 11/24/2016  . Spinal stenosis of lumbar region with neurogenic claudication 11/08/2016  . Anxiety  06/08/2015    Ronnie Derby, SPT 06/29/2020, 10:21 AM  [place] [facility] [address] [place], [place], [address] Phone: [phone]   Fax:  [phone]  Name: Vinia Jemmott MRN: [id number] Date of Birth: 1941/05/03

## 2020-07-01 NOTE — Addendum Note (Signed)
Addended by: Cammie Mcgee on: 07/01/2020 10:48 AM   Modules accepted: Orders

## 2020-07-04 ENCOUNTER — Ambulatory Visit: Payer: Medicare Other | Admitting: Physical Therapy

## 2020-07-06 ENCOUNTER — Ambulatory Visit: Payer: Medicare Other | Admitting: Physical Therapy

## 2020-07-11 ENCOUNTER — Encounter: Payer: Self-pay | Admitting: Physical Therapy

## 2020-07-11 ENCOUNTER — Other Ambulatory Visit: Payer: Self-pay

## 2020-07-11 ENCOUNTER — Ambulatory Visit: Payer: Medicare Other | Admitting: Physical Therapy

## 2020-07-11 DIAGNOSIS — M6281 Muscle weakness (generalized): Secondary | ICD-10-CM

## 2020-07-11 DIAGNOSIS — R269 Unspecified abnormalities of gait and mobility: Secondary | ICD-10-CM

## 2020-07-11 DIAGNOSIS — Z9181 History of falling: Secondary | ICD-10-CM

## 2020-07-11 NOTE — Therapy (Signed)
[facility] [address] [place], [place], [address] Phone: [phone]   Fax:  [phone]  Physical Therapy Treatment  Patient Details  Name: Evelyn Cisneros MRN: [id number] Date of Birth: 1941-09-23 Referring Provider (PT): Damaris Schooner, DO   Encounter Date: 07/11/2020   PT End of Session - 07/11/20 1256    Visit Number 3    Number of Visits 9    Date for PT Re-Evaluation 07/25/20    Authorization - Visit Number 3    Authorization - Number of Visits 10    PT Start Time 1255    PT Stop Time 1344    PT Time Calculation (min) 49 min    Equipment Utilized During Treatment Gait belt    Activity Tolerance Patient tolerated treatment well    Behavior During Therapy WFL for tasks assessed/performed           Past Medical History:  Diagnosis Date   Anemia    Arthritis    Heart murmur    Hypertension    Hypothyroidism    Spondylolisthesis of lumbar region     Past Surgical History:  Procedure Laterality Date   ABDOMINAL HYSTERECTOMY     REPLACEMENT TOTAL KNEE BILATERAL     SPINE SURGERY      There were no vitals filed for this visit.   Subjective Assessment - 07/11/20 1254    Subjective Pt reports traveling to [place] over the previous week and just returned last night. Pt states she is "tired" but had fun visiting friends.    Limitations Lifting;Standing;Walking;House hold activities    How long can you stand comfortably? 10-15 min    How long can you walk comfortably? 1-2 blocks    Patient Stated Goals Reduce falls, improve BLE strength, improve balance and walking.    Currently in Pain? No/denies    Pain Score 0-No pain                 Outpatient Rehab from 04/01/2018 in [facility]  Lymphedema Life Impact Scale Total Score 23.53 %      Neuro re-ed:   Obstacle course in hallway: Pt with SPC and CGA+1. Pt practiced stepping over objects and weaving through  cones. Pt experienced difficulty with stepping over objects, needing to slow down and stop in front of each. Progressed to walking over hurdle and weaving over cones with blue mat underneath. Verbal cueing for improved hip flexion when stepping over objects to decrease hip circumduction compensation.   Single leg cone taps in front of // bars: CGA provided, initially 1 hand on bar. PT called color of cone for pt to tap, 3 cones placed in front of patient in small arc. Decreased UE support to 2 fingers on bar. Approx 10 taps per leg.   Resisted walking 1BTB in // bars: all planes. 5x length of bars each. Verbal cueing for staying in line during sidestepping. Sidestepping with L leg leading more difficult than R leg leading, experienced few LOB but able to self recover with use of bars. Forward walking with L leg on bars initially, cueing for slower larger steps during backwards walking.    Single leg taps in // bars: beginning with approx 5 inch height (plinth plus airex pad). Provided CGA throughout. Verbal cueing for decreasing posterior lean during hip flexion and increasing pure hip flex.    Weight shifts on ariex pad in // bars: R/L and forwards/backwards. 2 min  total. Increased difficulty fwd and bck requiring intermittent LUE on bar to correct balance.     PT Long Term Goals - 06/28/20 0720      PT LONG TERM GOAL #1   Title Pt will imporve FOTO score to 56 to demonstrate perceived improvements in functional mobility.    Baseline 9/7: 43    Time 4    Period Weeks    Status New    Target Date 07/25/20      PT LONG TERM GOAL #2   Title Pt will improve BERG balance test to >52/56 to reduce risk of falls with ADL's.    Baseline 9/7: 38/56    Time 4    Period Weeks    Status New    Target Date 07/25/20      PT LONG TERM GOAL #3   Title Pt will improve DGI score to > 22/24 to demonstrate reduced risk of falls with daily ambulation tasks.    Baseline 9/7: 14/24    Time 4    Period  Weeks    Status New    Target Date 07/25/20      PT LONG TERM GOAL #4   Title Pt will improve 5xSTS score to < 12.0 sec to demonstrate clinically significant increase in BLE strength to improve functional mobility.    Baseline 9/7: 14.68 sec    Time 4    Period Weeks    Status New    Target Date 07/25/20      PT LONG TERM GOAL #5   Title Pt will asc/desc 4 stairs with reciprocal gait pattern and single hand rail use with 50% reduction of R hip hike with asc to improve ability to asc stairs safely.    Baseline 9/7: Reciprocal pattern with B handrail use and R hip hike with asc    Time 4    Period Weeks    Status New    Target Date 07/25/20           Plan - 07/11/20 1507    Clinical Impression Statement Pt demonstrates difficulty with SLS tasks and stepping over obstacles like small bolsters and 3" hurdles without turning sideways as compensaiton due to hip flexion weakness. After verbal and tactile cues for form pt demonstrated good improvement in stepping over obstacles forward with redcue compensations. Pt demonstrated most diffculty with resisted walking with single BTB walking forwards and stepping to the L due to R weakness in LE compared to LLE. Pt can continue to benefit from skilled PT to improve functional mobility and reduce risk of falls.    Personal Factors and Comorbidities Age;Comorbidity 2;Fitness    Examination-Activity Limitations Transfers;Bend;Lift;Squat;Stairs;Locomotion Level;Carry;Stand    Examination-Participation Restrictions Cleaning;Community Activity;Other    Stability/Clinical Decision Making Stable/Uncomplicated    Clinical Decision Making Low    Rehab Potential Fair    PT Frequency 2x / week    PT Duration 4 weeks    PT Treatment/Interventions ADLs/Self Care Home Management;Aquatic Therapy;Cryotherapy;Moist Heat;Gait training;Stair training;Functional mobility training;Neuromuscular re-education;Balance training;Therapeutic exercise;Therapeutic  activities;Patient/family education;Manual techniques;Passive range of motion    PT Next Visit Plan SLS and dynamic balance    PT Home Exercise Plan STS, SLS and tandem stance at counter. Standing marches at [employer] and Agree with Plan of Care Patient           Patient will benefit from skilled therapeutic intervention in order to improve the following deficits and impairments:  Abnormal gait, Impaired sensation, Improper body  mechanics, Pain, Postural dysfunction, Decreased mobility, Decreased activity tolerance, Decreased endurance, Decreased range of motion, Decreased strength, Hypomobility, Impaired flexibility, Difficulty walking, Decreased balance  Visit Diagnosis: Gait difficulty  Muscle weakness (generalized)  Right-sided muscle weakness  At risk for falls     Problem List Patient Active Problem List   Diagnosis Date Noted   Spondylolisthesis of lumbar region 05/04/2018   Abnormal glucose level 02/12/2018   Abnormal weight gain 02/12/2018   Allergic rhinitis 02/12/2018   Asthma 02/12/2018   Bradycardia 02/12/2018   Edema 02/12/2018   Heart murmur 02/12/2018   Hypercalcemia 02/12/2018   Hypokalemia 02/12/2018   Pure hypercholesterolemia 02/12/2018   Shoulder joint pain 02/12/2018   Skin sensation disturbance 02/12/2018   Syncope 02/12/2018   Vitamin D deficiency 02/12/2018   Central cervical cord injury, 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, 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) 04/24/2017   Muscle spasticity 04/24/2017   Weight loss 12/19/2016   Hypothyroidism 11/24/2016   Bilateral leg edema 11/24/2016   Cervical radiculopathy 11/24/2016   Disc degeneration, lumbar 11/24/2016   Essential hypertension 11/24/2016   Gallstones 11/24/2016   History of anxiety state 11/24/2016   Hyperlipidemia, unspecified 11/24/2016   Spinal stenosis of lumbar region with neurogenic claudication 11/08/2016   Anxiety 06/08/2015   Cammie Mcgee, PT, DPT # [id number], SPT 07/11/2020, 3:57 PM  [facility] [address] [place], [place], [address] Phone: [phone]   Fax:  [phone]  Name: Evelyn Cisneros MRN: [id number] Date of Birth: 1941/04/08

## 2020-07-13 ENCOUNTER — Other Ambulatory Visit: Payer: Self-pay

## 2020-07-13 ENCOUNTER — Encounter: Payer: Self-pay | Admitting: Physical Therapy

## 2020-07-13 ENCOUNTER — Ambulatory Visit: Payer: Medicare Other | Admitting: Physical Therapy

## 2020-07-13 DIAGNOSIS — M6281 Muscle weakness (generalized): Secondary | ICD-10-CM | POA: Diagnosis not present

## 2020-07-13 DIAGNOSIS — R269 Unspecified abnormalities of gait and mobility: Secondary | ICD-10-CM

## 2020-07-13 DIAGNOSIS — Z9181 History of falling: Secondary | ICD-10-CM

## 2020-07-13 NOTE — Therapy (Signed)
[facility] [address] [place], [place], [address] Phone: [phone]   Fax:  [phone]  Physical Therapy Treatment  Patient Details  Name: Evelyn Cisneros MRN: [id number] Date of Birth: 12-01-1940 Referring Provider (PT): Damaris Schooner, [place]   Encounter Date: 07/13/2020   PT End of Session - 07/13/20 1254    Visit Number 4    Number of Visits 9    Date for PT Re-Evaluation 07/25/20    Authorization - Visit Number 4    Authorization - Number of Visits 10    PT Start Time 1254    PT Stop Time 1341    PT Time Calculation (min) 47 min    Equipment Utilized During Treatment Gait belt    Activity Tolerance Patient tolerated treatment well    Behavior During Therapy [facility] for tasks assessed/performed           Past Medical History:  Diagnosis Date  . Anemia   . Arthritis   . Heart murmur   . Hypertension   . Hypothyroidism   . Spondylolisthesis of lumbar region     Past Surgical History:  Procedure Laterality Date  . ABDOMINAL HYSTERECTOMY    . REPLACEMENT TOTAL KNEE BILATERAL    . SPINE SURGERY      There were no vitals filed for this visit.   Subjective Assessment - 07/13/20 1254    Subjective Pt. reports having more energy today, and that she has already ran errands earlier. No falls and reports compliance with HEP.    Limitations Lifting;Standing;Walking;House hold activities    How long can you stand comfortably? 10-15 min    How long can you walk comfortably? 1-2 blocks    Patient Stated Goals Reduce falls, improve BLE strength, improve balance and walking.    Currently in Pain? No/denies           There Ex:  Nustep L3 LE only. 5 mins  Neuro re-ed:  Outdoor walking with SPC: walking on uneven surfaces, ascending/descending curbs, ascending/descending stairs, ambulating up/down hills. Pt provided CGA throughout.   Obstacle course in hallway: Pt using SPC and provided CGA. Course includes stepping over  objects, stepping onto/off of airex pads, weaving cones, and single leg cone taps. Pt demonstrated improved carryover with stepping over objects. Pt able to progress cone weaving to tighter weaving around cones, and to being able to complete step over step pattern when going over objects.   6" hurdles in // bars: pt provided CGA and two hands on bars.   // bars airex balance beam: side stepping with CGA and two hands on bar. Pt progressed to one hand support. x8 total. x2 with RUE only support.    PT Long Term Goals - 06/28/20 0720      PT LONG TERM GOAL #1   Title Pt will imporve FOTO score to 56 to demonstrate perceived improvements in functional mobility.    Baseline 9/7: 43    Time 4    Period Weeks    Status New    Target Date 07/25/20      PT LONG TERM GOAL #2   Title Pt will improve BERG balance test to >52/56 to reduce risk of falls with ADL's.    Baseline 9/7: 38/56    Time 4    Period Weeks    Status New    Target Date 07/25/20      PT LONG TERM GOAL #3   Title Pt will improve  DGI score to > 22/24 to demonstrate reduced risk of falls with daily ambulation tasks.    Baseline 9/7: 14/24    Time 4    Period Weeks    Status New    Target Date 07/25/20      PT LONG TERM GOAL #4   Title Pt will improve 5xSTS score to < 12.0 sec to demonstrate clinically significant increase in BLE strength to improve functional mobility.    Baseline 9/7: 14.68 sec    Time 4    Period Weeks    Status New    Target Date 07/25/20      PT LONG TERM GOAL #5   Title Pt will asc/desc 4 stairs with reciprocal gait pattern and single hand rail use with 50% reduction of R hip hike with asc to improve ability to asc stairs safely.    Baseline 9/7: Reciprocal pattern with B handrail use and R hip hike with asc    Time 4    Period Weeks    Status New    Target Date 07/25/20                 Plan - 07/14/20 0905    Clinical Impression Statement Pt demonstrated difficulty with trailing  leg catching hurdle. Pt instructed on improving knee flexion with trailing leg, demonstrated variable ability to clear. Pt progressed to side stepping over hurdles. PT provided min assist to R distal posterior thigh during side stepping to clear hurdles. Cueing for improved upright posture during side stepping.    Personal Factors and Comorbidities Age;Comorbidity 2;Fitness    Examination-Activity Limitations Transfers;Bend;Lift;Squat;Stairs;Locomotion Level;Carry;Stand    Examination-Participation Restrictions Cleaning;Community Activity;Other    Stability/Clinical Decision Making Stable/Uncomplicated    Clinical Decision Making Low    Rehab Potential Fair    PT Frequency 2x / week    PT Duration 4 weeks    PT Treatment/Interventions ADLs/Self Care Home Management;Aquatic Therapy;Cryotherapy;Moist Heat;Gait training;Stair training;Functional mobility training;Neuromuscular re-education;Balance training;Therapeutic exercise;Therapeutic activities;Patient/family education;Manual techniques;Passive range of motion    PT Next Visit Plan SLS and dynamic balance    PT Home Exercise Plan STS, SLS and tandem stance at counter. Standing marches at [employer] and Agree with Plan of Care Patient           Patient will benefit from skilled therapeutic intervention in order to improve the following deficits and impairments:  Abnormal gait, Impaired sensation, Improper body mechanics, Pain, Postural dysfunction, Decreased mobility, Decreased activity tolerance, Decreased endurance, Decreased range of motion, Decreased strength, Hypomobility, Impaired flexibility, Difficulty walking, Decreased balance  Visit Diagnosis: Gait difficulty  Muscle weakness (generalized)  Right-sided muscle weakness  At risk for falls     Problem List Patient Active Problem List   Diagnosis Date Noted  . Spondylolisthesis of lumbar region 05/04/2018  . Abnormal glucose level 02/12/2018  . Abnormal weight  gain 02/12/2018  . Allergic rhinitis 02/12/2018  . Asthma 02/12/2018  . Bradycardia 02/12/2018  . Edema 02/12/2018  . Heart murmur 02/12/2018  . Hypercalcemia 02/12/2018  . Hypokalemia 02/12/2018  . Pure hypercholesterolemia 02/12/2018  . Shoulder joint pain 02/12/2018  . Skin sensation disturbance 02/12/2018  . Syncope 02/12/2018  . Vitamin D deficiency 02/12/2018  . 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 Muscle spasticity 04/24/2017  . Weight loss 12/19/2016  . Hypothyroidism 11/24/2016  . Bilateral leg edema 11/24/2016  . Cervical radiculopathy 11/24/2016  . Disc degeneration, lumbar 11/24/2016  . Essential  hypertension 11/24/2016  . Gallstones 11/24/2016  . History of anxiety state 11/24/2016  . Hyperlipidemia, unspecified 11/24/2016  . Spinal stenosis of lumbar region with neurogenic claudication 11/08/2016  . Anxiety 06/08/2015   Cammie Mcgee, PT, DPT # [id number] SPT 07/14/2020, 9:08 AM  [facility] [address] [place], [place], [address] Phone: [phone]   Fax:  [phone]  Name: Meiah Zamudio MRN: [id number] Date of Birth: 20-Jan-1941

## 2020-07-18 ENCOUNTER — Other Ambulatory Visit: Payer: Self-pay

## 2020-07-18 ENCOUNTER — Ambulatory Visit: Payer: Medicare Other | Admitting: Physical Therapy

## 2020-07-18 ENCOUNTER — Encounter: Payer: Self-pay | Admitting: Physical Therapy

## 2020-07-18 DIAGNOSIS — M6281 Muscle weakness (generalized): Secondary | ICD-10-CM | POA: Diagnosis not present

## 2020-07-18 DIAGNOSIS — Z9181 History of falling: Secondary | ICD-10-CM

## 2020-07-18 DIAGNOSIS — R269 Unspecified abnormalities of gait and mobility: Secondary | ICD-10-CM

## 2020-07-18 NOTE — Therapy (Signed)
[facility] [address] [place], [place], [address] Phone: [phone]   Fax:  [phone]  Physical Therapy Treatment  Patient Details  Name: Evelyn Cisneros MRN: [id number] Date of Birth: 07-26-1941 Referring Provider (PT): Damaris Schooner, [place]   Encounter Date: 07/18/2020   PT End of Session - 07/18/20 1301    Visit Number 5    Number of Visits 9    Date for PT Re-Evaluation 07/25/20    Authorization - Visit Number 5    Authorization - Number of Visits 10    PT Start Time 1259    PT Stop Time 1345    PT Time Calculation (min) 46 min    Equipment Utilized During Treatment Gait belt    Activity Tolerance Patient tolerated treatment well    Behavior During Therapy [facility] for tasks assessed/performed           Past Medical History:  Diagnosis Date  . Anemia   . Arthritis   . Heart murmur   . Hypertension   . Hypothyroidism   . Spondylolisthesis of lumbar region     Past Surgical History:  Procedure Laterality Date  . ABDOMINAL HYSTERECTOMY    . REPLACEMENT TOTAL KNEE BILATERAL    . SPINE SURGERY      There were no vitals filed for this visit.   Subjective Assessment - 07/18/20 1259    Subjective Pt reports walking further distances over the weekend than she is used to. No falls since previous session. Compliant with HEP.    Limitations Lifting;Standing;Walking;House hold activities    How long can you stand comfortably? 10-15 min    How long can you walk comfortably? 1-2 blocks    Patient Stated Goals Reduce falls, improve BLE strength, improve balance and walking.    Currently in Pain? No/denies    Pain Score 0-No pain           There.ex:   Nu-Step L3 for 6 min. No UE use. Not billed.    Neuro Re-Ed:   STS from standard height chair: 1x10. Verbal cues for equal WB'ing. Use of mirror for visual feedback for posture.  STS from standard height chair with airex pad under feet to challenge balance: 1x10.  CGA+1  STS from standard height chair with weighted med ball (small): 1x5  Obstacle course: 3" step ups (x5/LE) --> stepping over 1/2 bolster --> side steps on long airex pad --> airex pad weight shifts R/L x5/LE --> Reaching cone stacking outside BOS. x4 total, CGA+1   Progressed to adding stepping over 3x3" hurdles. Difficulty clearing RLE over hurdle requiring minA+1 to correct LOB.   Asc/Desc stairs: focus on reciprocal pattern and single UE support. 4 stairs x3. Verbal cues for up with the good/down with the bad. With descending, verbal cues for getting RLE toes over step to ease difficulty of progressing RLE to following step with good carryover after cues. SBA+1      Outpatient Rehab from 04/01/2018 in [facility]  Lymphedema Life Impact Scale Total Score 23.53 %      PT Education - 07/18/20 1300    Education Details form/technique with exercise.    Person(s) Educated Patient    Methods Explanation;Demonstration    Comprehension Verbalized understanding;Returned demonstration               PT Long Term Goals - 06/28/20 0720      PT LONG TERM GOAL #1  Title Pt will imporve FOTO score to 56 to demonstrate perceived improvements in functional mobility.    Baseline 9/7: 43    Time 4    Period Weeks    Status New    Target Date 07/25/20      PT LONG TERM GOAL #2   Title Pt will improve BERG balance test to >52/56 to reduce risk of falls with ADL's.    Baseline 9/7: 38/56    Time 4    Period Weeks    Status New    Target Date 07/25/20      PT LONG TERM GOAL #3   Title Pt will improve DGI score to > 22/24 to demonstrate reduced risk of falls with daily ambulation tasks.    Baseline 9/7: 14/24    Time 4    Period Weeks    Status New    Target Date 07/25/20      PT LONG TERM GOAL #4   Title Pt will improve 5xSTS score to < 12.0 sec to demonstrate clinically significant increase in BLE strength to improve functional mobility.     Baseline 9/7: 14.68 sec    Time 4    Period Weeks    Status New    Target Date 07/25/20      PT LONG TERM GOAL #5   Title Pt will asc/desc 4 stairs with reciprocal gait pattern and single hand rail use with 50% reduction of R hip hike with asc to improve ability to asc stairs safely.    Baseline 9/7: Reciprocal pattern with B handrail use and R hip hike with asc    Time 4    Period Weeks    Status New    Target Date 07/25/20            Plan - 07/18/20 1348    Clinical Impression Statement Pt progressing with STS with improved, equal WB'ing through LE's. Increased difficulty with STS's with feet on airex pad requiring LUE support on // bars. Pt displayed most difficulty with stepping over small hurdles with R toe catching. MinA+1 to correct stepping over hurdles with multiple attempts. Pt displayed good hip and ankle strategy with reaching outside of BOS with no LOB. Pt can continue to benefit from skilled PT to reduce risks of falls and improve functional mobility.    Personal Factors and Comorbidities Age;Comorbidity 2;Fitness    Examination-Activity Limitations Transfers;Bend;Lift;Squat;Stairs;Locomotion Level;Carry;Stand    Examination-Participation Restrictions Cleaning;Community Activity;Other    Stability/Clinical Decision Making Stable/Uncomplicated    Rehab Potential Fair    PT Frequency 2x / week    PT Duration 4 weeks    PT Treatment/Interventions ADLs/Self Care Home Management;Aquatic Therapy;Cryotherapy;Moist Heat;Gait training;Stair training;Functional mobility training;Neuromuscular re-education;Balance training;Therapeutic exercise;Therapeutic activities;Patient/family education;Manual techniques;Passive range of motion    PT Next Visit Plan Stepping over hurdles, R hip flexion.    PT Home Exercise Plan STS, SLS and tandem stance at counter. Standing marches at [employer] and Agree with Plan of Care Patient           Patient will benefit from skilled  therapeutic intervention in order to improve the following deficits and impairments:  Abnormal gait, Impaired sensation, Improper body mechanics, Pain, Postural dysfunction, Decreased mobility, Decreased activity tolerance, Decreased endurance, Decreased range of motion, Decreased strength, Hypomobility, Impaired flexibility, Difficulty walking, Decreased balance  Visit Diagnosis: Gait difficulty  Muscle weakness (generalized)  Right-sided muscle weakness  At risk for falls     Problem List Patient Active  Problem List   Diagnosis Date Noted  . Spondylolisthesis of lumbar region 05/04/2018  . Abnormal glucose level 02/12/2018  . Abnormal weight gain 02/12/2018  . Allergic rhinitis 02/12/2018  . Asthma 02/12/2018  . Bradycardia 02/12/2018  . Edema 02/12/2018  . Heart murmur 02/12/2018  . Hypercalcemia 02/12/2018  . Hypokalemia 02/12/2018  . Pure hypercholesterolemia 02/12/2018  . Shoulder joint pain 02/12/2018  . Skin sensation disturbance 02/12/2018  . Syncope 02/12/2018  . Vitamin D deficiency 02/12/2018  . 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 Muscle spasticity 04/24/2017  . Weight loss 12/19/2016  . Hypothyroidism 11/24/2016  . Bilateral leg edema 11/24/2016  . Cervical radiculopathy 11/24/2016  . Disc degeneration, lumbar 11/24/2016  . Essential hypertension 11/24/2016  . Gallstones 11/24/2016  . History of anxiety state 11/24/2016  . Hyperlipidemia, unspecified 11/24/2016  . Spinal stenosis of lumbar region with neurogenic claudication 11/08/2016  . Anxiety 06/08/2015   Cammie Mcgee, PT, DPT # [id number] SPT 07/19/2020, 12:34 PM  [facility] [address] [place], [place], [address] Phone: [phone]   Fax:  [phone]  Name: Katesha Eichel MRN: [id number] Date of Birth: 1941/10/20

## 2020-07-20 ENCOUNTER — Other Ambulatory Visit: Payer: Self-pay

## 2020-07-20 ENCOUNTER — Encounter: Payer: Self-pay | Admitting: Physical Therapy

## 2020-07-20 ENCOUNTER — Ambulatory Visit: Payer: Medicare Other

## 2020-07-20 DIAGNOSIS — M6281 Muscle weakness (generalized): Secondary | ICD-10-CM

## 2020-07-20 DIAGNOSIS — Z9181 History of falling: Secondary | ICD-10-CM

## 2020-07-20 DIAGNOSIS — R269 Unspecified abnormalities of gait and mobility: Secondary | ICD-10-CM

## 2020-07-20 NOTE — Therapy (Signed)
[facility] [address] [place], [place], [address] Phone: [phone]   Fax:  [phone]  Physical Therapy Treatment  Patient Details  Name: Evelyn Cisneros MRN: [id number] Date of Birth: 11/25/1940 Referring Provider (PT): Damaris Schooner, DO   Encounter Date: 07/20/2020   PT End of Session - 07/20/20 1411    Visit Number 6    Number of Visits 9    Date for PT Re-Evaluation 07/25/20    Authorization - Visit Number 6    Authorization - Number of Visits 10    PT Start Time 1114    PT Stop Time 1200    PT Time Calculation (min) 46 min    Equipment Utilized During Treatment Gait belt    Activity Tolerance Patient tolerated treatment well    Behavior During Therapy WFL for tasks assessed/performed           Past Medical History:  Diagnosis Date   Anemia    Arthritis    Heart murmur    Hypertension    Hypothyroidism    Spondylolisthesis of lumbar region     Past Surgical History:  Procedure Laterality Date   ABDOMINAL HYSTERECTOMY     REPLACEMENT TOTAL KNEE BILATERAL     SPINE SURGERY      There were no vitals filed for this visit.   Subjective Assessment - 07/20/20 1406    Subjective Pt reports being busy this week. No falls or issues since previous session.    Limitations Lifting;Standing;Walking;House hold activities    How long can you stand comfortably? 10-15 min    How long can you walk comfortably? 1-2 blocks    Patient Stated Goals Reduce falls, improve BLE strength, improve balance and walking.    Currently in Pain? No/denies    Pain Score 0-No pain          There.ex:   Nu-Step L4 for 5 min. UE/LE use. Not billed.  B hip extension and abduction with green TB: 2x12. Mod verbal and tactile cues for form/tehcnique. Updated HEP to include these exercises. Use of counter top for UE support at home.   Resisted Side stepping: Green TB, x8 in // bars. UE support for safety. Use of mirror for  form/technique   Walking over small hurdles to improve B hip flexor strength: 3 hurdles, x8. Progressed from BUE support in // bars to LUE support. Verbal cues to lead with RLE with improved carryover to clear R foot with reduced R hip circumduction and ant lean. CGA+1. Still displaying difficulty clearing RLE over hurdle with minimal scraping of R foot over hurdle.   Neuro Re-Ed:   Reaching with RUE outside BOS to stack cones to improve functional reach: 2x12 cones. Verbal cues to reach across body and outside BOS to challenge balance. Use of RUE to challenge RUE weakness. CGA+1.    Outpatient Rehab from 04/01/2018 in [facility]  Lymphedema Life Impact Scale Total Score 23.53 %      PT Education - 07/20/20 1410    Education Details Form/technique with exercise.    Person(s) Educated Patient    Methods Explanation;Demonstration    Comprehension Verbalized understanding;Returned demonstration               PT Long Term Goals - 06/28/20 0720      PT LONG TERM GOAL #1   Title Pt will imporve FOTO score to 56 to demonstrate perceived improvements in functional mobility.  Baseline 9/7: 43    Time 4    Period Weeks    Status New    Target Date 07/25/20      PT LONG TERM GOAL #2   Title Pt will improve BERG balance test to >52/56 to reduce risk of falls with ADL's.    Baseline 9/7: 38/56    Time 4    Period Weeks    Status New    Target Date 07/25/20      PT LONG TERM GOAL #3   Title Pt will improve DGI score to > 22/24 to demonstrate reduced risk of falls with daily ambulation tasks.    Baseline 9/7: 14/24    Time 4    Period Weeks    Status New    Target Date 07/25/20      PT LONG TERM GOAL #4   Title Pt will improve 5xSTS score to < 12.0 sec to demonstrate clinically significant increase in BLE strength to improve functional mobility.    Baseline 9/7: 14.68 sec    Time 4    Period Weeks    Status New    Target Date 07/25/20       PT LONG TERM GOAL #5   Title Pt will asc/desc 4 stairs with reciprocal gait pattern and single hand rail use with 50% reduction of R hip hike with asc to improve ability to asc stairs safely.    Baseline 9/7: Reciprocal pattern with B handrail use and R hip hike with asc    Time 4    Period Weeks    Status New    Target Date 07/25/20                 Plan - 07/20/20 1412    Clinical Impression Statement Pt improved with stepping over small hurdles in // bars. With verbal cues to lead with RLE, pt able to safely elevate foot over hurdles with improvement and single UE use. Worked on RUE reaching outside BOS to incorporate balance with improving R sided residual weakness. Updated HEP to include resisted hip abduciton and extension with green TB. Pt can continue to benefit from skilled PT treatment to improve functional mobility and reduce risk of falls.    Personal Factors and Comorbidities Age;Comorbidity 2;Fitness    Examination-Activity Limitations Transfers;Bend;Lift;Squat;Stairs;Locomotion Level;Carry;Stand    Examination-Participation Restrictions Cleaning;Community Activity;Other    Stability/Clinical Decision Making Stable/Uncomplicated    Clinical Decision Making Low    Rehab Potential Fair    PT Frequency 2x / week    PT Duration 4 weeks    PT Treatment/Interventions ADLs/Self Care Home Management;Aquatic Therapy;Cryotherapy;Moist Heat;Gait training;Stair training;Functional mobility training;Neuromuscular re-education;Balance training;Therapeutic exercise;Therapeutic activities;Patient/family education;Manual techniques;Passive range of motion    PT Next Visit Plan Dual tasking    PT Home Exercise Plan STS, SLS and tandem stance at counter. Standing marches at [employer]. Resisted hip abduction and extension with green TB    Consulted and Agree with Plan of Care Patient           Patient will benefit from skilled therapeutic intervention in order to improve the  following deficits and impairments:  Abnormal gait, Impaired sensation, Improper body mechanics, Pain, Postural dysfunction, Decreased mobility, Decreased activity tolerance, Decreased endurance, Decreased range of motion, Decreased strength, Hypomobility, Impaired flexibility, Difficulty walking, Decreased balance  Visit Diagnosis: Gait difficulty  Muscle weakness (generalized)  Right-sided muscle weakness  At risk for falls     Problem List Patient Active Problem List   Diagnosis  Date Noted   Spondylolisthesis of lumbar region 05/04/2018   Abnormal glucose level 02/12/2018   Abnormal weight gain 02/12/2018   Allergic rhinitis 02/12/2018   Asthma 02/12/2018   Bradycardia 02/12/2018   Edema 02/12/2018   Heart murmur 02/12/2018   Hypercalcemia 02/12/2018   Hypokalemia 02/12/2018   Pure hypercholesterolemia 02/12/2018   Shoulder joint pain 02/12/2018   Skin sensation disturbance 02/12/2018   Syncope 02/12/2018   Vitamin D deficiency 02/12/2018   Central cervical cord injury, 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. [place], [place], [address] Phone: [phone]   Fax:  [phone]  Name: Evelyn Cisneros MRN: [id number] Date of Birth: [birth year]

## 2020-07-27 ENCOUNTER — Encounter: Payer: Self-pay | Admitting: Physical Therapy

## 2020-07-27 ENCOUNTER — Other Ambulatory Visit: Payer: Self-pay

## 2020-07-27 ENCOUNTER — Ambulatory Visit: Payer: Medicare Other | Attending: Family Medicine | Admitting: Physical Therapy

## 2020-07-27 DIAGNOSIS — R269 Unspecified abnormalities of gait and mobility: Secondary | ICD-10-CM | POA: Diagnosis present

## 2020-07-27 DIAGNOSIS — Z9181 History of falling: Secondary | ICD-10-CM | POA: Diagnosis present

## 2020-07-27 DIAGNOSIS — M6281 Muscle weakness (generalized): Secondary | ICD-10-CM | POA: Insufficient documentation

## 2020-07-27 NOTE — Therapy (Signed)
[facility] [address] [place], [place], [address] Phone: [phone]   Fax:  [phone]  Physical Therapy Treatment  Patient Details  Name: Evelyn Cisneros MRN: [id number] Date of Birth: 1940/11/28 Referring Provider (PT): Dereck Ligas, [place]   Encounter Date: 07/27/2020   PT End of Session - 07/27/20 1202    Visit Number 7    Number of Visits 17    Date for PT Re-Evaluation 08/24/20    Authorization - Visit Number 1    Authorization - Number of Visits 10    PT Start Time 5631    PT Stop Time 1156    PT Time Calculation (min) 40 min    Equipment Utilized During Treatment Gait belt    Activity Tolerance Patient tolerated treatment well    Behavior During Therapy [facility] for tasks assessed/performed           Past Medical History:  Diagnosis Date  . Anemia   . Arthritis   . Heart murmur   . Hypertension   . Hypothyroidism   . Spondylolisthesis of lumbar region     Past Surgical History:  Procedure Laterality Date  . ABDOMINAL HYSTERECTOMY    . REPLACEMENT TOTAL KNEE BILATERAL    . SPINE SURGERY      There were no vitals filed for this visit.   Subjective Assessment - 07/27/20 1153    Subjective Pt reports LBP after previous session due to rotational balance tasks. No falls and compliant wit HEP.    Limitations Lifting;Standing;Walking;House hold activities    How long can you stand comfortably? 10-15 min    How long can you walk comfortably? 1-2 blocks    Patient Stated Goals Reduce falls, improve BLE strength, improve balance and walking.    Currently in Pain? No/denies    Pain Score 0-No pain              Neuro Re-Ed:   Session spent reassessing pt's goals. Ernesta Amble, DGI, ability to reciprocally asc/desc stairs with single hand rail use, and FOTO. Pt making progress towards goals although none have been accomplished. Please refer to goals and clinical impression sections of note for specifics. Pt  educated on improvements towards goals and that she is making progress. Agreement on future PT POC.          Outpatient Rehab from 04/01/2018 in [facility]  Lymphedema Life Impact Scale Total Score 23.53 %      PT Education - 07/27/20 1202    Education Details form/technique with exercise.    Person(s) Educated Patient    Methods Explanation;Demonstration    Comprehension Verbalized understanding;Returned demonstration               PT Long Term Goals - 07/27/20 1207      PT LONG TERM GOAL #1   Title Pt will imporve FOTO score to 56 to demonstrate perceived improvements in functional mobility.    Baseline 9/7: 43; 10/7: 53/56    Time 4    Period Weeks    Status Not Met    Target Date 08/24/20      PT LONG TERM GOAL #2   Title Pt will improve BERG balance test to >52/56 to reduce risk of falls with ADL's.    Baseline 9/7: 38/56; 10/7: 50/56    Time 4    Period Weeks    Status Not Met    Target Date 08/24/20  PT LONG TERM GOAL #3   Title Pt will improve DGI score to > 22/24 to demonstrate reduced risk of falls with daily ambulation tasks.    Baseline 9/7: 14/24; 10/7: 16/24    Time 4    Period Weeks    Status Not Met    Target Date 08/24/20      PT LONG TERM GOAL #4   Title Pt will improve 5xSTS score to < 12.0 sec to demonstrate clinically significant increase in BLE strength to improve functional mobility.    Baseline 9/7: 14.68 sec; 10/7: on-going and deferred due to fatigue.    Time 4    Period Weeks    Status On-going    Target Date 08/24/20      PT LONG TERM GOAL #5   Title Pt will asc/desc 4 stairs with reciprocal gait pattern and single hand rail use with 50% reduction of R hip hike with asc to improve ability to asc stairs safely.    Baseline 9/7: Reciprocal pattern with B handrail use and R hip hike with asc; 10/7: Reciprocal pattern with single handrailuse and no R hip hike/circumduction. Needed verbal  cues. Desc with B hand rail use,    Time 4    Period Weeks    Status Partially Met    Target Date 08/24/20                 Plan - 07/27/20 1203    Clinical Impression Statement Pt making progress toward goals. Scored 53/56 on foto with initial score of 38. Pt also progressing towards goals with Berg balance and DGI scoring a 50/56 decreasing risk of falls and a 16/24. Although pt is at a decreased risk of falls she is still a low falls risk. Pt also improving ability to asc stairs with single handrail with LUE and reciprocal pattern with no R hip circumduction with verbal cueing. With desc pt requires step to pattern and B handrail support. 5xSTS goal deferred today due to fatigue and also requesting to end session a few min early so she could make it on itme to her husband's medical appointment on time. Pt can continue to benefit from skilled PT treatment to further reduce risk of falls and to improve functional mobility.    Personal Factors and Comorbidities Age;Comorbidity 2;Fitness    Examination-Activity Limitations Transfers;Bend;Lift;Squat;Stairs;Locomotion Level;Carry;Stand    Examination-Participation Restrictions Cleaning;Community Activity;Other    Stability/Clinical Decision Making Stable/Uncomplicated    Clinical Decision Making Low    Rehab Potential Fair    PT Frequency 2x / week    PT Duration 4 weeks    PT Treatment/Interventions ADLs/Self Care Home Management;Aquatic Therapy;Cryotherapy;Moist Heat;Gait training;Stair training;Functional mobility training;Neuromuscular re-education;Balance training;Therapeutic exercise;Therapeutic activities;Patient/family education;Manual techniques;Passive range of motion    PT Next Visit Plan Reassess5xSTS goal.    PT Home Exercise Plan STS, SLS and tandem stance at counter. Standing marches at [employer]. Resisted hip abduction and extension with green TB    Consulted and Agree with Plan of Care Patient           Patient will  benefit from skilled therapeutic intervention in order to improve the following deficits and impairments:  Abnormal gait, Impaired sensation, Improper body mechanics, Pain, Postural dysfunction, Decreased mobility, Decreased activity tolerance, Decreased endurance, Decreased range of motion, Decreased strength, Hypomobility, Impaired flexibility, Difficulty walking, Decreased balance  Visit Diagnosis: Gait difficulty  Muscle weakness (generalized)  Right-sided muscle weakness  At risk for falls     Problem List Patient  Active Problem List   Diagnosis Date Noted  . Spondylolisthesis of lumbar region 05/04/2018  . Abnormal glucose level 02/12/2018  . Abnormal weight gain 02/12/2018  . Allergic rhinitis 02/12/2018  . Asthma 02/12/2018  . Bradycardia 02/12/2018  . Edema 02/12/2018  . Heart murmur 02/12/2018  . Hypercalcemia 02/12/2018  . Hypokalemia 02/12/2018  . Pure hypercholesterolemia 02/12/2018  . Shoulder joint pain 02/12/2018  . Skin sensation disturbance 02/12/2018  . Syncope 02/12/2018  . Vitamin D deficiency 02/12/2018  . 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 Muscle spasticity 04/24/2017  . Weight loss 12/19/2016  . Hypothyroidism 11/24/2016  . Bilateral leg edema 11/24/2016  . Cervical radiculopathy 11/24/2016  . Disc degeneration, lumbar 11/24/2016  . Essential hypertension 11/24/2016  . Gallstones 11/24/2016  . History of anxiety state 11/24/2016  . Hyperlipidemia, unspecified 11/24/2016  . Spinal stenosis of lumbar region with neurogenic claudication 11/08/2016  . Anxiety 06/08/2015   Pura Spice, PT, DPT # [id number] Larna Daughters, SPT 07/27/2020, 12:57 PM  [facility] [address] [place], [place], [address] Phone: [phone]   Fax:  [phone]  Name: Evelyn Cisneros MRN: [id number] Date of Birth: 1941-04-15

## 2020-08-01 ENCOUNTER — Encounter: Payer: Medicare Other | Admitting: Physical Therapy

## 2020-08-03 ENCOUNTER — Encounter: Payer: Medicare Other | Admitting: Physical Therapy

## 2020-08-08 ENCOUNTER — Encounter: Payer: Self-pay | Admitting: Physical Therapy

## 2020-08-08 ENCOUNTER — Other Ambulatory Visit: Payer: Self-pay

## 2020-08-08 ENCOUNTER — Ambulatory Visit: Payer: Medicare Other

## 2020-08-08 DIAGNOSIS — R269 Unspecified abnormalities of gait and mobility: Secondary | ICD-10-CM | POA: Diagnosis not present

## 2020-08-08 DIAGNOSIS — Z9181 History of falling: Secondary | ICD-10-CM

## 2020-08-08 DIAGNOSIS — M6281 Muscle weakness (generalized): Secondary | ICD-10-CM

## 2020-08-08 NOTE — Therapy (Signed)
[facility] [address] [place], [place], [address] Phone: [phone]   Fax:  [phone]  Physical Therapy Treatment  Patient Details  Name: Evelyn Cisneros MRN: [id number] Date of Birth: 21-Oct-1941 Referring Provider (PT): Dereck Ligas, DO   Encounter Date: 08/08/2020   PT End of Session - 08/08/20 1214    Visit Number 8    Number of Visits 17    Date for PT Re-Evaluation 08/24/20    Authorization - Visit Number 2    Authorization - Number of Visits 10    PT Start Time 1115    PT Stop Time 1202    PT Time Calculation (min) 47 min    Equipment Utilized During Treatment Gait belt    Activity Tolerance Patient tolerated treatment well    Behavior During Therapy WFL for tasks assessed/performed           Past Medical History:  Diagnosis Date   Anemia    Arthritis    Heart murmur    Hypertension    Hypothyroidism    Spondylolisthesis of lumbar region     Past Surgical History:  Procedure Laterality Date   ABDOMINAL HYSTERECTOMY     REPLACEMENT TOTAL KNEE BILATERAL     SPINE SURGERY      There were no vitals filed for this visit.   Subjective Assessment - 08/08/20 1213    Subjective Pt reports thursday will be her last day with therapy. No falls or issues since previous session. Reports being very busy with medical appointments for her and her husband.    Limitations Lifting;Standing;Walking;House hold activities    How long can you stand comfortably? 10-15 min    How long can you walk comfortably? 1-2 blocks    Patient Stated Goals Reduce falls, improve BLE strength, improve balance and walking.    Currently in Pain? No/denies    Pain Score 0-No pain          There.ex:   Nu-Step L4 UE/LE use for 3 min  Standing B hip abduction/hip extension: 3 lbs AW's 2x10. Use of mirror for form/technique.   Standing heel raises with 3 lbs AW's: 1x20    Neuro Re-Ed:   Reassessed 5xSTS: 11.78 sec completed  with a goal of < 12 sec.   4 way resisted walking in // bars: x1BTB, x5/direction. Intermittent BUE support with verbal cues to use single UE support with poor carryover due to fear. CGA+1   Resisted BTB forward cone taps: 1x10/LE. Unable to flex R hip to tap cone. Utilization of x2 pilates blocks stacked together with ability to flex R hip to tap.      PT Education - 08/08/20 1213    Education Details form/technique with exercise.    Person(s) Educated Patient    Methods Explanation;Demonstration    Comprehension Verbalized understanding;Returned demonstration               PT Long Term Goals - 08/08/20 1243      PT LONG TERM GOAL #1   Title Pt will imporve FOTO score to 56 to demonstrate perceived improvements in functional mobility.    Baseline 9/7: 43; 10/7: 53/56    Time 4    Period Weeks    Status Not Met      PT LONG TERM GOAL #2   Title Pt will improve BERG balance test to >52/56 to reduce risk of falls with ADL's.    Baseline 9/7: 38/56; 10/7: 50/56  Time 4    Period Weeks    Status Not Met      PT LONG TERM GOAL #3   Title Pt will improve DGI score to > 22/24 to demonstrate reduced risk of falls with daily ambulation tasks.    Baseline 9/7: 14/24; 10/7: 16/24    Time 4    Period Weeks    Status Not Met      PT LONG TERM GOAL #4   Title Pt will improve 5xSTS score to < 12.0 sec to demonstrate clinically significant increase in BLE strength to improve functional mobility.    Baseline 9/7: 14.68 sec; 10/7: on-going and deferred due to fatigue.; 10/19: 11.58 sec    Time 4    Period Weeks    Status Achieved    Target Date 08/08/20      PT LONG TERM GOAL #5   Title Pt will asc/desc 4 stairs with reciprocal gait pattern and single hand rail use with 50% reduction of R hip hike with asc to improve ability to asc stairs safely.    Baseline 9/7: Reciprocal pattern with B handrail use and R hip hike with asc; 10/7: Reciprocal pattern with single handrailuse and no  R hip hike/circumduction. Needed verbal cues. Desc with B hand rail use,    Time 4    Period Weeks    Status Partially Met                 Plan - 08/08/20 1214    Clinical Impression Statement Pt accomplised 5xSTS goal scoring 11.58 sec with a goal of completing in < 12 sec placing pt at decreasedrisk of falls for her age. Pt displayed difficulty with resisted walking with alternating cone taps due to balance impairments and difficulty with R hip flexion due ot residual weakness. Required intermittent BUE support in bars with CGA+1 to perform. Pt can continue to benefit from skilled PT treatment to improve functional mobility.    Personal Factors and Comorbidities Age;Comorbidity 2;Fitness    Examination-Activity Limitations Transfers;Bend;Lift;Squat;Stairs;Locomotion Level;Carry;Stand    Examination-Participation Restrictions Cleaning;Community Activity;Other    Stability/Clinical Decision Making Stable/Uncomplicated    Clinical Decision Making Low    Rehab Potential Fair    PT Frequency 2x / week    PT Duration 4 weeks    PT Treatment/Interventions ADLs/Self Care Home Management;Aquatic Therapy;Cryotherapy;Moist Heat;Gait training;Stair training;Functional mobility training;Neuromuscular re-education;Balance training;Therapeutic exercise;Therapeutic activities;Patient/family education;Manual techniques;Passive range of motion    PT Next Visit Plan Dynamic balance    PT Home Exercise Plan STS, SLS and tandem stance at counter. Standing marches at [employer]. Resisted hip abduction and extension with green TB    Consulted and Agree with Plan of Care Patient           Patient will benefit from skilled therapeutic intervention in order to improve the following deficits and impairments:  Abnormal gait, Impaired sensation, Improper body mechanics, Pain, Postural dysfunction, Decreased mobility, Decreased activity tolerance, Decreased endurance, Decreased range of motion, Decreased strength,  Hypomobility, Impaired flexibility, Difficulty walking, Decreased balance  Visit Diagnosis: Gait difficulty  Muscle weakness (generalized)  At risk for falls  Right-sided muscle weakness     Problem List Patient Active Problem List   Diagnosis Date Noted   Spondylolisthesis of lumbar region 05/04/2018   Abnormal glucose level 02/12/2018   Abnormal weight gain 02/12/2018   Allergic rhinitis 02/12/2018   Asthma 02/12/2018   Bradycardia 02/12/2018   Edema 02/12/2018   Heart murmur 02/12/2018   Hypercalcemia 02/12/2018  Hypokalemia 02/12/2018   Pure hypercholesterolemia 02/12/2018   Shoulder joint pain 02/12/2018   Skin sensation disturbance 02/12/2018   Syncope 02/12/2018   Vitamin D deficiency 02/12/2018   Central cervical cord injury, 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 [address] [place], [place], [address] Phone: [phone]   Fax:  [phone]  Name: Evana Runnels MRN: [id number] Date of Birth: Jan 22, 1941

## 2020-08-10 ENCOUNTER — Other Ambulatory Visit: Payer: Self-pay

## 2020-08-10 ENCOUNTER — Ambulatory Visit: Payer: Medicare Other | Admitting: Physical Therapy

## 2020-08-10 ENCOUNTER — Encounter: Payer: Self-pay | Admitting: Physical Therapy

## 2020-08-10 DIAGNOSIS — R269 Unspecified abnormalities of gait and mobility: Secondary | ICD-10-CM | POA: Diagnosis not present

## 2020-08-10 DIAGNOSIS — Z9181 History of falling: Secondary | ICD-10-CM

## 2020-08-10 DIAGNOSIS — M6281 Muscle weakness (generalized): Secondary | ICD-10-CM

## 2020-08-10 NOTE — Therapy (Signed)
[facility] [address]. [place], [place], [address] Phone: [phone]   Fax:  [phone]  Physical Therapy Treatment/Discharge  Patient Details  Name: Evelyn Cisneros MRN: [id number] Date of Birth: 1941/09/17 Referring Provider (PT): Dereck Ligas, DO   Encounter Date: 08/10/2020   PT End of Session - 08/10/20 1212    Visit Number 9    Number of Visits 17    Date for PT Re-Evaluation 08/24/20    Authorization - Visit Number 3    Authorization - Number of Visits 10    PT Start Time 1110    PT Stop Time 6754    PT Time Calculation (min) 48 min    Equipment Utilized During Treatment Gait belt    Activity Tolerance Patient tolerated treatment well    Behavior During Therapy WFL for tasks assessed/performed           Past Medical History:  Diagnosis Date   Anemia    Arthritis    Heart murmur    Hypertension    Hypothyroidism    Spondylolisthesis of lumbar region     Past Surgical History:  Procedure Laterality Date   ABDOMINAL HYSTERECTOMY     REPLACEMENT TOTAL KNEE BILATERAL     SPINE SURGERY      There were no vitals filed for this visit.   Subjective Assessment - 08/10/20 1211    Subjective No reports of falls since previous session. Pt been busy with medical appointments for her husband.    Limitations Lifting;Standing;Walking;House hold activities    How long can you stand comfortably? 10-15 min    How long can you walk comfortably? 1-2 blocks    Patient Stated Goals Reduce falls, improve BLE strength, improve balance and walking.    Currently in Pain? No/denies    Pain Score 0-No pain          There.ex:   Nu-Step L4 for 5 min. LE's only. Not billed.   Neuro Re-Ed:   Standing airex pad ball toss to challenge hip/ankle strategy: 2x10 tosses with CGA+1.   Side steps on long airex pad in // bars: x8 CGA+1 Use of single UE support due to fear of falls.   Amb over 3 small hurdles in //bars with  single UE support: x8 with improved RLE clearance. Still displays min hip circumduction for compensation but able to clear consistently without getting tripped up on hurdle. CGA+1   Obstacle course: Tight cone weaves --> reciprocal steps on aoirex pads--> amb over unstable surface (blue mat) with 3 AW's underneath for added challenge --> stepping over 1/2 bolster leading with RLE: x4 with CGA+1   Asc/Desc steps with BUE support and 3 lbs AW's: x4 with SBA+1. Improved ability to clear RLE. Minimal post lean noted with asc to help RLE clearance as pt fatigued. Safe with BUE support on handrails however and no LOB.       Outpatient Rehab from 04/01/2018 in [facility]  Lymphedema Life Impact Scale Total Score 23.53 %      PT Education - 08/10/20 1212    Education Details form/technique with exercise.    Person(s) Educated Patient    Methods Explanation;Tactile cues;Verbal cues    Comprehension Verbalized understanding;Returned demonstration               PT Long Term Goals - 08/08/20 1243      PT LONG TERM GOAL #1   Title Pt will imporve  FOTO score to 56 to demonstrate perceived improvements in functional mobility.    Baseline 9/7: 43; 10/7: 53/56    Time 4    Period Weeks    Status Not Met      PT LONG TERM GOAL #2   Title Pt will improve BERG balance test to >52/56 to reduce risk of falls with ADL's.    Baseline 9/7: 38/56; 10/7: 50/56    Time 4    Period Weeks    Status Not Met      PT LONG TERM GOAL #3   Title Pt will improve DGI score to > 22/24 to demonstrate reduced risk of falls with daily ambulation tasks.    Baseline 9/7: 14/24; 10/7: 16/24    Time 4    Period Weeks    Status Not Met      PT LONG TERM GOAL #4   Title Pt will improve 5xSTS score to < 12.0 sec to demonstrate clinically significant increase in BLE strength to improve functional mobility.    Baseline 9/7: 14.68 sec; 10/7: on-going and deferred due to  fatigue.; 10/19: 11.58 sec    Time 4    Period Weeks    Status Achieved    Target Date 08/08/20      PT LONG TERM GOAL #5   Title Pt will asc/desc 4 stairs with reciprocal gait pattern and single hand rail use with 50% reduction of R hip hike with asc to improve ability to asc stairs safely.    Baseline 9/7: Reciprocal pattern with B handrail use and R hip hike with asc; 10/7: Reciprocal pattern with single handrailuse and no R hip hike/circumduction. Needed verbal cues. Desc with B hand rail use,    Time 4    Period Weeks    Status Partially Met                 Plan - 08/10/20 1213    Clinical Impression Statement Pt. limited in balance stepping over obstacles with no UE support due to fear of falls. However, noted improvement stepping over hurdles with RLE with no scuffing of foot on top of hurdle.  Mild circumduction to get R foot over hurdle intermittently. Displayed ability to safely ascend 6" stairs with 3 lbs AW's with BUE support with improved clearance of stair with RLE with SBA+1.  Pt. can continue to benefit from skilled PT treatment to improve functional mobility.    Personal Factors and Comorbidities Age;Comorbidity 2;Fitness    Examination-Activity Limitations Transfers;Bend;Lift;Squat;Stairs;Locomotion Level;Carry;Stand    Examination-Participation Restrictions Cleaning;Community Activity;Other    Stability/Clinical Decision Making Stable/Uncomplicated    Clinical Decision Making Low    Rehab Potential Fair    PT Frequency 2x / week    PT Duration 4 weeks    PT Treatment/Interventions ADLs/Self Care Home Management;Aquatic Therapy;Cryotherapy;Moist Heat;Gait training;Stair training;Functional mobility training;Neuromuscular re-education;Balance training;Therapeutic exercise;Therapeutic activities;Patient/family education;Manual techniques;Passive range of motion    PT Next Visit Plan Discharge.  Pt. will contact PT if any regression of symptoms or questions.    PT  Home Exercise Plan STS, SLS and tandem stance at counter. Standing marches at [employer]. Resisted hip abduction and extension with green TB    Consulted and Agree with Plan of Care Patient           Patient will benefit from skilled therapeutic intervention in order to improve the following deficits and impairments:  Abnormal gait, Impaired sensation, Improper body mechanics, Pain, Postural dysfunction, Decreased mobility, Decreased activity tolerance, Decreased  endurance, Decreased range of motion, Decreased strength, Hypomobility, Impaired flexibility, Difficulty walking, Decreased balance  Visit Diagnosis: Gait difficulty  Muscle weakness (generalized)  At risk for falls  Right-sided muscle weakness     Problem List Patient Active Problem List   Diagnosis Date Noted   Spondylolisthesis of lumbar region 05/04/2018   Abnormal glucose level 02/12/2018   Abnormal weight gain 02/12/2018   Allergic rhinitis 02/12/2018   Asthma 02/12/2018   Bradycardia 02/12/2018   Edema 02/12/2018   Heart murmur 02/12/2018   Hypercalcemia 02/12/2018   Hypokalemia 02/12/2018   Pure hypercholesterolemia 02/12/2018   Shoulder joint pain 02/12/2018   Skin sensation disturbance 02/12/2018   Syncope 02/12/2018   Vitamin D deficiency 02/12/2018   Central cervical cord injury, 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, 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 ([place]) 04/24/2017   Muscle spasticity 04/24/2017   Weight loss 12/19/2016   Hypothyroidism 11/24/2016   Bilateral leg edema 11/24/2016   Cervical radiculopathy 11/24/2016   Disc degeneration, lumbar 11/24/2016   Essential hypertension 11/24/2016   Gallstones 11/24/2016   History of anxiety state 11/24/2016   Hyperlipidemia, unspecified 11/24/2016   Spinal stenosis of lumbar region with neurogenic claudication 11/08/2016   Anxiety 06/08/2015   Pura Spice, PT, DPT # [id number] Larna Daughters, SPT 08/11/2020, 8:06 AM  [facility]  [facility] [address] [place], [place], [address] Phone: [phone]   Fax:  [phone]  Name: Elianie Hubers MRN: [id number] Date of Birth: May 24, 1941

## 2020-08-17 ENCOUNTER — Ambulatory Visit: Payer: Medicare Other | Admitting: Physical Therapy

## 2021-07-02 ENCOUNTER — Other Ambulatory Visit: Payer: Self-pay

## 2021-07-02 ENCOUNTER — Encounter: Payer: Self-pay | Admitting: Physical Therapy

## 2021-07-02 ENCOUNTER — Ambulatory Visit: Payer: Medicare Other | Attending: Neurology | Admitting: Physical Therapy

## 2021-07-02 DIAGNOSIS — R269 Unspecified abnormalities of gait and mobility: Secondary | ICD-10-CM | POA: Insufficient documentation

## 2021-07-02 DIAGNOSIS — Z9181 History of falling: Secondary | ICD-10-CM | POA: Diagnosis present

## 2021-07-02 DIAGNOSIS — M6281 Muscle weakness (generalized): Secondary | ICD-10-CM | POA: Insufficient documentation

## 2021-07-02 NOTE — Therapy (Signed)
[place] [facility] [address] [place], [place], [address] Phone: [phone]   Fax:  [phone]  Physical Therapy Treatment and Initial Evaluation 07/02/21  Patient Details  Name: Dafna Romo MRN: [id number] Date of Birth: November 19, 1940 Referring Provider (PT): Pearletha Alfred MD   Encounter Date: 07/02/2021   PT End of Session - 07/03/21 0926     Visit Number 1    Number of Visits 12    Date for PT Re-Evaluation 08/13/21    Authorization - Visit Number 1    Authorization - Number of Visits 10    Progress Note Due on Visit 10    PT Start Time 1346    PT Stop Time 1432    PT Time Calculation (min) 46 min    Equipment Utilized During Treatment Gait belt    Activity Tolerance Patient tolerated treatment well    Behavior During Therapy WFL for tasks assessed/performed             Past Medical History:  Diagnosis Date   Anemia    Arthritis    Heart murmur    Hypertension    Hypothyroidism    Spondylolisthesis of lumbar region     Past Surgical History:  Procedure Laterality Date   ABDOMINAL HYSTERECTOMY     REPLACEMENT TOTAL KNEE BILATERAL     SPINE SURGERY      There were no vitals filed for this visit.   Subjective Assessment - 07/03/21 0725     Subjective Pt present to tx with c/c of balance instability with recent fall 04/28/21. Pt received CT scan to rule out head/ cervical spine trauma. Pt current amb in the home without AD and UE assist on home-objects. Pt amb in the community with SPC in LUE. Pt has a surgical history of bilat TKA with greater weakness and reduced use of RLE compared to LLE. Pt states that she expereinces occasional dizziness during rapid rotations head movements.    Limitations Lifting;Standing;Walking;House hold activities    How long can you stand comfortably? deferred to lateral tx.    How long can you walk comfortably? deferred to lateral tx.    Diagnostic tests CT 04/30/21 rule out head/spine  trauma post fall.    Patient Stated Goals Decrease risk of falls and improve mobility.    Currently in Pain? No/denies    Pain Score 0-No pain                [facility] PT Assessment - 07/03/21 0001       Assessment   Medical Diagnosis Gait Instability    Referring Provider (PT) Pearletha Alfred MD    Onset Date/Surgical Date 10/22/19    Prior Therapy Well known to therapy      Precautions   Precautions Fall      Restrictions   Weight Bearing Restrictions No      Balance Screen   Has the patient fallen in the past 6 months Yes    How many times? 1    Has the patient had a decrease in activity level because of a fear of falling?  Yes    Is the patient reluctant to leave their home because of a fear of falling?  Yes      Home [employer]ist information centre manager residence      Prior Function   Level of Independence Independent    Vocation Retired  SUBJECTIVE  Chief complaint:  Pt present to tx with c/c of balance instability with recent fall 04/28/21. Pt received CT scan to rule out head/ cervical spine trauma. Pt current amb in the home without AD and UE assist on home-objects. Pt amb in the community with SPC in LUE. Pt has a surgical history of bilat TKA with greater weakness and reduced use of RLE compared to LLE. Pt states that she expereinces occ dizziness during rapid rotations head movements. Referring Dx: Gait instability.  Referring Provider: Pearletha AlfredScott Strine MD Pain location: No pain stated at initial evaluation  Pain: Present /10, Best /10, Worst /10: History of injury, pain, surgery, or therapy:  Follow-up appointment with MD: spine fx and surgery, bilat knee replacements.  Imaging: CT 04/30/21 rule out head/spine trauma post fall. Falls in the last 6 months: Yes; fall 04/28/21 Occupational demands: Retired  Goals: Decrease risk of falls and improve mobility.  Red flags (bowel/bladder changes, saddle paresthesia, personal history of cancer,  chills/fever, night sweats, unrelenting pain, first onset of insidious LBP <20 y/o) Negative    OBJECTIVE  Mental Status Patient is oriented to person, place and time.  Recent memory is intact.  Remote memory is intact.  Attention span and concentration are intact.  Expressive speech is intact.  Patient's fund of knowledge is within normal limits for educational level.  SENSATION: Grossly intact to light touch bilateral LEs as determined by testing dermatomes L2-S2    MUSCULOSKELETAL: Tremor: None Bulk: Normal Tone: Normal No visible step-off along spinal column  Posture Lumbar lordosis: WNL Iliac crest height: equal bilaterally Lumbar lateral shift: negative Lower crossed syndrome (tight hip flexors and erector spinae; weak gluts and abs): negative  Gait Narrow base of support. SPC in LUE, Reduced step length and height. Pt freq catches her feet on the ground (LLE>RLE). Pt displays fear of movement especially during rotation movements or change in direction. Stairs: pt requires heavy UE assist during reciprocal gait patterns and transfer stairs laterally during single rail assist.     Palpation -deferred to lateal tx.    Strength (out of 5) R/L 4-/4 Hip flexion 4-/4 Hip abduction 4-/4 Hip adduction 4-/4 Knee extension 4-/4 Knee flexion 4-/4 Ankle dorsiflexion 4-/4 Ankle plantarflexion    *Indicates pain   AROM (degrees) R/L Examination of ROM deferred to lateral tx if needed 2/2 to time.    Functional Tasks Lifting: able to lift a small object off the floor during BERG.     Squatting: deferred   Sit to stand: Pt is able to complete STS during BERG without UE assist when cued. Pt preferred to use bilat UE during STS due to poor balance.   Outcome measure: BERG 36/56 FOTO: 52/56    HEP:  Access Code: [email or web address] URL: [email or web address] Date: 07/02/2021 Prepared by: Dorene GrebeMichael Sherk  Exercises  Mini Squat with Counter Support - 1 x  daily - 5 x weekly - 3 sets - 10 reps Standing Hip Abduction with Counter Support - 1 x daily - 5 x weekly - 3 sets - 10 reps Standing March with Counter Support - 1 x daily - 5 x weekly - 3 sets - 10 reps Standing Hip Extension with Counter Support - 1 x daily - 5 x weekly - 3 sets - 10 reps Heel rises with counter support - 1 x daily - 5 x weekly - 3 sets - 10 reps     PT Education - 07/03/21 0926     Education Details Pt was  educated on proper form and technique with new HEP    Person(s) Educated Patient    Methods Explanation;Demonstration;Tactile cues;Verbal cues;Handout    Comprehension Verbalized understanding                 PT Long Term Goals - 07/03/21 0945       PT LONG TERM GOAL #1   Title PT will improve FOTO score to predicted improvement value of 56 for improvement self-reported functional ability.    Baseline 52    Time 6    Period Weeks    Status New    Target Date 08/13/21      PT LONG TERM GOAL #2   Title Pt will imrpove BERG balance to by MCID of 5 points to reduce objective fall risk and safer comunnity mobilty.    Baseline 36    Time 6    Period Weeks    Status New    Target Date 08/13/21      PT LONG TERM GOAL #3   Title Pt will increase strength of by at least 1/2 MMT grade in order to demonstrate improvement in strength and function.    Baseline 4-/4 Hip flexion  4-/4 Hip abduction  4-/4 Hip adduction  4-/4 Knee extension  4-/4 Knee flexion 4-/4 Ankle dorsiflexion  4-/4 Ankle plantarflexion    Time 6    Period Weeks    Status New    Target Date 08/13/21      PT LONG TERM GOAL #4   Title Pt will improve stair mobility with min bilat UE assist during reciprocal gait pattern 4 steps x 3 with occ verbal cueing.    Baseline pt requires heavy UE assist during reciprocal gait patterns and transfer stairs laterally during single rail assist.    Time 6    Period Weeks    Status New    Target Date 08/13/21                   Plan -  07/03/21 7867     Clinical Impression Statement Pt is a pleasant 80 year-old female referred for: Gait instability. PT examination reveals the following deficits: Outcome measure: BERG 36/56. FOTO: 52/56.  Pt is able to complete STS during BERG without UE assist when cued. Pt preferred to use bilat UE during STS due to poor balance. MMT:4-/4 Hip flexion. 4-/4 Hip abduction. 4-/4 Hip adduction. 4-/4 Knee extension. 4-/4 Knee flexion. 4-/4 Ankle dorsiflexion. 4-/4 Ankle plantarflexion. Gait: Narrow base of support. SPC in LUE, Reduced step length and height. Pt frequently catches her feet on the ground (LLE>RLE). Pt displays fear of movement especially during rotation movements or change in direction.  Pt case displays as moderate complexity with fair prognosis for optimal return to PLOF due to time from onset, current level of daily physically activity, PMH, and PLOF.  Pt will benefit from skilled PT 2x/week for 6 weeks to address deficits and promote safe independence with community and home related mobility and ADLs. Pt is a possible candiate for vestibular screening 2/2 reports of dizziness during rapid head movements.    Personal Factors and Comorbidities Age;Comorbidity 2;Fitness;Time since onset of injury/illness/exacerbation    Examination-Activity Limitations Transfers;Bend;Lift;Squat;Stairs;Locomotion Level;Carry;Stand;Dressing    Examination-Participation Restrictions Cleaning;Community Activity;Other    Stability/Clinical Decision Making Evolving/Moderate complexity    Clinical Decision Making Moderate    Rehab Potential Fair    PT Frequency 2x / week    PT Duration 6 weeks    PT  Treatment/Interventions ADLs/Self Care Home Management;Aquatic Therapy;Cryotherapy;Moist Heat;Gait training;Stair training;Functional mobility training;Neuromuscular re-education;Balance training;Therapeutic exercise;Therapeutic activities;Patient/family education;Manual techniques;Passive range of motion;Electrical  Stimulation;Canalith Repostioning;Ultrasound;Traction;DME Instruction;Dry needling;Energy conservation;Joint Manipulations;Vestibular;Spinal Manipulations    PT Next Visit Plan Reassess HEP adherence. ROM assessment,    PT Home Exercise Plan [email or web address]    Recommended Other Services Possible vestibular assessment from PT specialist    Consulted and Agree with Plan of Care Patient             Patient will benefit from skilled therapeutic intervention in order to improve the following deficits and impairments:  Abnormal gait, Impaired sensation, Improper body mechanics, Pain, Postural dysfunction, Decreased mobility, Decreased activity tolerance, Decreased endurance, Decreased range of motion, Decreased strength, Hypomobility, Impaired flexibility, Difficulty walking, Decreased balance, Dizziness  Visit Diagnosis: Gait difficulty  Muscle weakness (generalized)  At risk for falls  Right-sided muscle weakness     Problem List Patient Active Problem List   Diagnosis Date Noted   Spondylolisthesis of lumbar region 05/04/2018   Abnormal glucose level 02/12/2018   Abnormal weight gain 02/12/2018   Allergic rhinitis 02/12/2018   Asthma 02/12/2018   Bradycardia 02/12/2018   Edema 02/12/2018   Heart murmur 02/12/2018   Hypercalcemia 02/12/2018   Hypokalemia 02/12/2018   Pure hypercholesterolemia 02/12/2018   Shoulder joint pain 02/12/2018   Skin sensation disturbance 02/12/2018   Syncope 02/12/2018   Vitamin D deficiency 02/12/2018   Central cervical cord injury, 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, 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 ([facility]) 04/24/2017   Muscle spasticity 04/24/2017   Weight loss 12/19/2016   Hypothyroidism 11/24/2016   Bilateral leg edema 11/24/2016   Cervical radiculopathy 11/24/2016   Disc degeneration, lumbar 11/24/2016   Essential hypertension 11/24/2016   Gallstones 11/24/2016   History of anxiety state 11/24/2016   Hyperlipidemia, unspecified 11/24/2016   Spinal stenosis of lumbar region with  neurogenic claudication 11/08/2016   Anxiety 06/08/2015   Cammie Mcgee, PT, DPT # [id number] Lawernce Ion, SPT 07/03/2021, 12:03 PM  [place] [address] [place], [place], [address] Phone: [phone]   Fax:  [phone]  Name: Raya Mckinstry MRN: [id number] Date of Birth: [birth year]

## 2021-07-03 NOTE — Patient Instructions (Signed)
Access Code: [email or web address] URL: [email or web address] Date: 07/02/2021 Prepared by: Dorene Grebe  Exercises  Mini Squat with Counter Support - 1 x daily - 5 x weekly - 3 sets - 10 reps Standing Hip Abduction with Counter Support - 1 x daily - 5 x weekly - 3 sets - 10 reps Standing March with Counter Support - 1 x daily - 5 x weekly - 3 sets - 10 reps Standing Hip Extension with Counter Support - 1 x daily - 5 x weekly - 3 sets - 10 reps Heel rises with counter support - 1 x daily - 5 x weekly - 3 sets - 10 reps

## 2021-07-05 ENCOUNTER — Ambulatory Visit: Payer: Medicare Other | Admitting: Physical Therapy

## 2021-07-05 ENCOUNTER — Encounter: Payer: Self-pay | Admitting: Physical Therapy

## 2021-07-05 ENCOUNTER — Other Ambulatory Visit: Payer: Self-pay

## 2021-07-05 DIAGNOSIS — M6281 Muscle weakness (generalized): Secondary | ICD-10-CM

## 2021-07-05 DIAGNOSIS — R269 Unspecified abnormalities of gait and mobility: Secondary | ICD-10-CM | POA: Diagnosis not present

## 2021-07-05 DIAGNOSIS — Z9181 History of falling: Secondary | ICD-10-CM

## 2021-07-05 NOTE — Therapy (Signed)
[place] [facility] [address] [place], [place], [address] Phone: [phone]   Fax:  [phone]  Physical Therapy Treatment  Patient Details  Name: Evelyn Cisneros MRN: [id number] Date of Birth: July 15, 1941 Referring Provider (PT): Pearletha Alfred MD   Encounter Date: 07/05/2021   PT End of Session - 07/05/21 1441     Visit Number 2    Number of Visits 12    Date for PT Re-Evaluation 08/13/21    Authorization - Visit Number 2    Authorization - Number of Visits 10    Progress Note Due on Visit 10    PT Start Time 1431    PT Stop Time 1516    PT Time Calculation (min) 45 min    Equipment Utilized During Treatment Gait belt    Activity Tolerance Patient tolerated treatment well    Behavior During Therapy WFL for tasks assessed/performed             Past Medical History:  Diagnosis Date   Anemia    Arthritis    Heart murmur    Hypertension    Hypothyroidism    Spondylolisthesis of lumbar region     Past Surgical History:  Procedure Laterality Date   ABDOMINAL HYSTERECTOMY     REPLACEMENT TOTAL KNEE BILATERAL     SPINE SURGERY      There were no vitals filed for this visit.   Subjective Assessment - 07/05/21 1436     Subjective Pt presents to tx with with SPC. Pt state no falls from last tx. Pt states pain with the exercises that lasted an hour after completion. Pt presents with no pain at the start of tx today.    Limitations Lifting;Standing;Walking;House hold activities    How long can you sit comfortably? WNL    How long can you stand comfortably? 45 mins    How long can you walk comfortably? 15-20 mins    Diagnostic tests CT 04/30/21 rule out head/spine trauma post fall.    Patient Stated Goals Decrease risk of falls and improve mobility.    Currently in Pain? No/denies    Pain Score 0-No pain              Treatment  NuStep L2 without UE assist and pt education on the importance of LE alignment and proper  warm up to decrease risk of injury or fall. *not billed*   Neuromuscular Re-Education:  All treatment was completed with gait belt donned with CGA-min A to ensure limited fall risk with activity. Pt required great assist min A- Mod A during static balance with eye closed.    Standing static balance x1 mins in each position  1) standard stance  2) feet together  3) tandem x2  4) feet together with eyes closed    // bars with occ light UE assist: 1) lateral walking 3 laps  2) backwards walking 3 laps 3) backwards walking 3 laps  4) 6 '' step up 3x 1 mins      PT Education - 07/05/21 1440     Education Details Pt was educated on proper form and technique with balance intervention.    Person(s) Educated Patient    Methods Explanation;Demonstration;Verbal cues;Tactile cues    Comprehension Verbalized understanding;Need further instruction                 PT Long Term Goals - 07/03/21 0945       PT LONG TERM GOAL #1  Title PT will improve FOTO score to predicted improvement value of 56 for improvement self-reported functional ability.    Baseline 52    Time 6    Period Weeks    Status New    Target Date 08/13/21      PT LONG TERM GOAL #2   Title Pt will imrpove BERG balance to by MCID of 5 points to reduce objective fall risk and safer comunnity mobilty.    Baseline 36    Time 6    Period Weeks    Status New    Target Date 08/13/21      PT LONG TERM GOAL #3   Title Pt will increase strength of by at least 1/2 MMT grade in order to demonstrate improvement in strength and function.    Baseline 4-/4 Hip flexion  4-/4 Hip abduction  4-/4 Hip adduction  4-/4 Knee extension  4-/4 Knee flexion 4-/4 Ankle dorsiflexion  4-/4 Ankle plantarflexion    Time 6    Period Weeks    Status New    Target Date 08/13/21      PT LONG TERM GOAL #4   Title Pt will improve stair mobility with min bilat UE assist during reciprocal gait pattern 4 steps x 3 with occ verbal cueing.     Baseline pt requires heavy UE assist during reciprocal gait patterns and transfer stairs laterally during single rail assist.    Time 6    Period Weeks    Status New    Target Date 08/13/21                   Plan - 07/05/21 1441     Clinical Impression Statement Today's tx was focused on addition of standing balance activity. All treatment was completed with gait belt donned with CGA-min A to ensure limited fall risk with activity. Pt required great assist min A- Mod A during static balance with eye closed. Pt was very clear in the disapproval of home/community amb with FWW, and would like try hard to remain with SPC. Pt was educated on proper HEP adherence without to provocation of pain. Pt continues to display mark LE endurance, strength, and ROM deficits resulting in decreased safe home/community mobiity, increased pain, and limited access to QOL. Pt. will continue to benefit from skilled physical therapy to progress POC to address remaining deficits to facilitate maximum functional capacity for optimal personal health and wellness for ADLs.    Personal Factors and Comorbidities Age;Comorbidity 2;Fitness;Time since onset of injury/illness/exacerbation    Examination-Activity Limitations Transfers;Bend;Lift;Squat;Stairs;Locomotion Level;Carry;Stand;Dressing    Examination-Participation Restrictions Cleaning;Community Activity;Other    Stability/Clinical Decision Making Evolving/Moderate complexity    Clinical Decision Making Moderate    Rehab Potential Fair    PT Frequency 2x / week    PT Duration 6 weeks    PT Treatment/Interventions ADLs/Self Care Home Management;Aquatic Therapy;Cryotherapy;Moist Heat;Gait training;Stair training;Functional mobility training;Neuromuscular re-education;Balance training;Therapeutic exercise;Therapeutic activities;Patient/family education;Manual techniques;Passive range of motion;Electrical Stimulation;Canalith Repostioning;Ultrasound;Traction;DME  Instruction;Dry needling;Energy conservation;Joint Manipulations;Vestibular;Spinal Manipulations    PT Next Visit Plan Reassess HEP adherence. ROM assessment,    PT Home Exercise Plan [id number]    Consulted and Agree with Plan of Care Patient             Patient will benefit from skilled therapeutic intervention in order to improve the following deficits and impairments:  Abnormal gait, Impaired sensation, Improper body mechanics, Pain, Postural dysfunction, Decreased mobility, Decreased activity tolerance, Decreased endurance, Decreased range of motion, Decreased strength, Hypomobility,  Impaired flexibility, Difficulty walking, Decreased balance, Dizziness  Visit Diagnosis: Gait difficulty  Muscle weakness (generalized)  At risk for falls  Right-sided muscle weakness     Problem List Patient Active Problem List   Diagnosis Date Noted   Spondylolisthesis of lumbar region 05/04/2018   Abnormal glucose level 02/12/2018   Abnormal weight gain 02/12/2018   Allergic rhinitis 02/12/2018   Asthma 02/12/2018   Bradycardia 02/12/2018   Edema 02/12/2018   Heart murmur 02/12/2018   Hypercalcemia 02/12/2018   Hypokalemia 02/12/2018   Pure hypercholesterolemia 02/12/2018   Shoulder joint pain 02/12/2018   Skin sensation disturbance 02/12/2018   Syncope 02/12/2018   Vitamin D deficiency 02/12/2018   Central cervical cord injury, 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, 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 (HCC) 04/24/2017   Muscle spasticity 04/24/2017   Weight loss 12/19/2016   Hypothyroidism 11/24/2016   Bilateral leg edema 11/24/2016   Cervical radiculopathy 11/24/2016   Disc degeneration, lumbar 11/24/2016   Essential hypertension 11/24/2016   Gallstones 11/24/2016   History of anxiety state 11/24/2016   Hyperlipidemia, unspecified 11/24/2016   Spinal stenosis of lumbar region with neurogenic claudication 11/08/2016   Anxiety 06/08/2015   Cammie Mcgee, PT, DPT # [id number] Lawernce Ion, SPT 07/06/2021, 5:04  PM  [place] [facility] [address] [place], [place], [address] Phone: [phone]   Fax:  [phone]  Name: Shastina Rua MRN: [id number] Date of Birth: [birth year]

## 2021-07-09 ENCOUNTER — Other Ambulatory Visit: Payer: Self-pay

## 2021-07-09 ENCOUNTER — Ambulatory Visit: Payer: Medicare Other | Admitting: Physical Therapy

## 2021-07-09 ENCOUNTER — Encounter: Payer: Self-pay | Admitting: Physical Therapy

## 2021-07-09 DIAGNOSIS — R269 Unspecified abnormalities of gait and mobility: Secondary | ICD-10-CM

## 2021-07-09 DIAGNOSIS — M6281 Muscle weakness (generalized): Secondary | ICD-10-CM

## 2021-07-09 DIAGNOSIS — Z9181 History of falling: Secondary | ICD-10-CM

## 2021-07-09 NOTE — Therapy (Signed)
[place] [facility] [address] [place], [place], [address] Phone: [phone]   Fax:  [phone]  Physical Therapy Treatment  Patient Details  Name: Evelyn Cisneros MRN: [id number] Date of Birth: 1941/10/02 Referring Provider (PT): Pearletha Alfred MD   Encounter Date: 07/09/2021   PT End of Session - 07/09/21 1546     Visit Number 3    Number of Visits 12    Date for PT Re-Evaluation 08/13/21    Authorization - Visit Number 3    Authorization - Number of Visits 10    Progress Note Due on Visit 10    PT Start Time 1343    PT Stop Time 1431    PT Time Calculation (min) 48 min    Equipment Utilized During Treatment Gait belt    Activity Tolerance Patient tolerated treatment well    Behavior During Therapy WFL for tasks assessed/performed             Past Medical History:  Diagnosis Date   Anemia    Arthritis    Heart murmur    Hypertension    Hypothyroidism    Spondylolisthesis of lumbar region     Past Surgical History:  Procedure Laterality Date   ABDOMINAL HYSTERECTOMY     REPLACEMENT TOTAL KNEE BILATERAL     SPINE SURGERY      There were no vitals filed for this visit.   Subjective Assessment - 07/09/21 1351     Subjective Pt present to tx with SPC and no pain at the start of tx. Pt brought her shoes to assess a lift for possible leg length discrepancy. Pt states that she had a single episode of R knee pain on her bike, that resolved shortly after.    Limitations Lifting;Standing;Walking;House hold activities    How long can you sit comfortably? WNL    How long can you stand comfortably? 45 mins    How long can you walk comfortably? 15-20 mins    Diagnostic tests CT 04/30/21 rule out head/spine trauma post fall.    Patient Stated Goals Decrease risk of falls and improve mobility.    Currently in Pain? No/denies    Pain Score 0-No pain             Treatment/LE length assessment   NuStep L2 without UE assist  and pt education on the importance of LE alignment and proper warm up to decrease risk of injury or fall. Dicussed HEP.    Leg length  Standing  R/L ASIS: 85/88 cm Greater trochanter: 69/69    All treatment was completed with gait belt donned and in the //bars with CGA-min A to ensure limited fall risk with activity. Pt required great assist min A- Mod A during static balance with eye closed. Contact cueing was used at bilat iliac crest for pt feedback to ensure what a level pelvis feels like.  1) hip marching 3x10 2) L hip hike    Assessment of LLD in supine:  good symmetry at ASIS/ knee joint line/ medial malleoli No change in length at umbilicus to L and R medial malleoli.  PT does not recommend heel lift at this time and pt. Instructed in hip/glut med strengthening ex. Program in standing position.          PT Education - 07/09/21 1545     Education Details Pt was educated on functional and structure leg length discrepancies.    Person(s) Educated Patient  Methods Explanation;Demonstration;Tactile cues;Verbal cues    Comprehension Returned demonstration;Verbalized understanding;Need further instruction                 PT Long Term Goals - 07/03/21 0945       PT LONG TERM GOAL #1   Title PT will improve FOTO score to predicted improvement value of 56 for improvement self-reported functional ability.    Baseline 52    Time 6    Period Weeks    Status New    Target Date 08/13/21      PT LONG TERM GOAL #2   Title Pt will imrpove BERG balance to by MCID of 5 points to reduce objective fall risk and safer comunnity mobilty.    Baseline 36    Time 6    Period Weeks    Status New    Target Date 08/13/21      PT LONG TERM GOAL #3   Title Pt will increase strength of by at least 1/2 MMT grade in order to demonstrate improvement in strength and function.    Baseline 4-/4 Hip flexion  4-/4 Hip abduction  4-/4 Hip adduction  4-/4 Knee extension  4-/4 Knee flexion  4-/4 Ankle dorsiflexion  4-/4 Ankle plantarflexion    Time 6    Period Weeks    Status New    Target Date 08/13/21      PT LONG TERM GOAL #4   Title Pt will improve stair mobility with min bilat UE assist during reciprocal gait pattern 4 steps x 3 with occ verbal cueing.    Baseline pt requires heavy UE assist during reciprocal gait patterns and transfer stairs laterally during single rail assist.    Time 6    Period Weeks    Status New    Target Date 08/13/21                   Plan - 07/09/21 1546     Clinical Impression Statement Today's tx was focused on ruling out a structure leg length discrepancy with a functional leg length discrepancy. Standing measurement displayed:  R/L  ASIS: 85/88 and Greater trochanter: 69/69 with positive L [place] sign. Pt continues to display mark LE/UE endurance, strength, and ROM deficits resulting in decreased safe home/community mobiity, increased pain, and limited access to QOL. Pt. will continue to benefit from skilled physical therapy to progress POC to address remaining deficits to facilitate maximum functional capacity for optimal personal health and wellness for ADLs.    Personal Factors and Comorbidities Age;Comorbidity 2;Fitness;Time since onset of injury/illness/exacerbation    Examination-Activity Limitations Transfers;Bend;Lift;Squat;Stairs;Locomotion Level;Carry;Stand;Dressing    Examination-Participation Restrictions Cleaning;Community Activity;Other    Stability/Clinical Decision Making Evolving/Moderate complexity    Clinical Decision Making Moderate    Rehab Potential Fair    PT Frequency 2x / week    PT Duration 6 weeks    PT Treatment/Interventions ADLs/Self Care Home Management;Aquatic Therapy;Cryotherapy;Moist Heat;Gait training;Stair training;Functional mobility training;Neuromuscular re-education;Balance training;Therapeutic exercise;Therapeutic activities;Patient/family education;Manual techniques;Passive range of  motion;Electrical Stimulation;Canalith Repostioning;Ultrasound;Traction;DME Instruction;Dry needling;Energy conservation;Joint Manipulations;Vestibular;Spinal Manipulations    PT Next Visit Plan Reassess pelvic stabilization during SLS movement.    PT Home Exercise Plan [id number]    Consulted and Agree with Plan of Care Patient             Patient will benefit from skilled therapeutic intervention in order to improve the following deficits and impairments:  Abnormal gait, Impaired sensation, Improper body mechanics, Pain, Postural dysfunction, Decreased mobility, Decreased activity  tolerance, Decreased endurance, Decreased range of motion, Decreased strength, Hypomobility, Impaired flexibility, Difficulty walking, Decreased balance, Dizziness  Visit Diagnosis: Gait difficulty  Muscle weakness (generalized)  At risk for falls  Right-sided muscle weakness     Problem List Patient Active Problem List   Diagnosis Date Noted   Spondylolisthesis of lumbar region 05/04/2018   Abnormal glucose level 02/12/2018   Abnormal weight gain 02/12/2018   Allergic rhinitis 02/12/2018   Asthma 02/12/2018   Bradycardia 02/12/2018   Edema 02/12/2018   Heart murmur 02/12/2018   Hypercalcemia 02/12/2018   Hypokalemia 02/12/2018   Pure hypercholesterolemia 02/12/2018   Shoulder joint pain 02/12/2018   Skin sensation disturbance 02/12/2018   Syncope 02/12/2018   Vitamin D deficiency 02/12/2018   Central cervical cord injury, 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, 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) 04/24/2017   Muscle spasticity 04/24/2017   Weight loss 12/19/2016   Hypothyroidism 11/24/2016   Bilateral leg edema 11/24/2016   Cervical radiculopathy 11/24/2016   Disc degeneration, lumbar 11/24/2016   Essential hypertension 11/24/2016   Gallstones 11/24/2016   History of anxiety state 11/24/2016   Hyperlipidemia, unspecified 11/24/2016   Spinal stenosis of lumbar region with neurogenic claudication 11/08/2016   Anxiety  06/08/2015   Cammie Mcgee, PT, DPT # [id number] Lawernce Ion, SPT 07/09/2021, 8:04 PM  [place] [facility] [address] [place], [place], [address] Phone: [phone]   Fax:  [phone]  Name: Evelyn Cisneros MRN: [id number] Date of Birth: 19-Jun-1941

## 2021-07-12 ENCOUNTER — Other Ambulatory Visit: Payer: Self-pay

## 2021-07-12 ENCOUNTER — Encounter: Payer: Self-pay | Admitting: Physical Therapy

## 2021-07-12 ENCOUNTER — Ambulatory Visit: Payer: Medicare Other | Admitting: Physical Therapy

## 2021-07-12 DIAGNOSIS — R269 Unspecified abnormalities of gait and mobility: Secondary | ICD-10-CM | POA: Diagnosis not present

## 2021-07-12 DIAGNOSIS — Z9181 History of falling: Secondary | ICD-10-CM

## 2021-07-12 DIAGNOSIS — M6281 Muscle weakness (generalized): Secondary | ICD-10-CM

## 2021-07-12 NOTE — Therapy (Signed)
[facility] [address] [place], [place], [address] Phone: [phone]   Fax:  [phone]  Physical Therapy Treatment  Patient Details  Name: Evelyn Cisneros MRN: [id number] Date of Birth: 08/13/1941 Referring Provider (PT): Pearletha Alfred MD   Encounter Date: 07/12/2021   PT End of Session - 07/12/21 1612     Visit Number 4    Number of Visits 12    Date for PT Re-Evaluation 08/13/21    Authorization - Visit Number 4    Authorization - Number of Visits 10    Progress Note Due on Visit 10    PT Start Time 1509    PT Stop Time 1602    PT Time Calculation (min) 53 min    Equipment Utilized During Treatment Gait belt    Activity Tolerance Patient tolerated treatment well    Behavior During Therapy WFL for tasks assessed/performed             Past Medical History:  Diagnosis Date   Anemia    Arthritis    Heart murmur    Hypertension    Hypothyroidism    Spondylolisthesis of lumbar region     Past Surgical History:  Procedure Laterality Date   ABDOMINAL HYSTERECTOMY     REPLACEMENT TOTAL KNEE BILATERAL     SPINE SURGERY      There were no vitals filed for this visit.   Subjective Assessment - 07/12/21 1525     Subjective Pt presents to tx with increased fatigue 2/2 to prolonged company at the house. Pt states no pain or soreness after last tx. Pt amb with SPC. Pt states slight confusion during hip hikes with which leg is correct.    Limitations Lifting;Standing;Walking;House hold activities    How long can you sit comfortably? WNL    How long can you stand comfortably? 45 mins    How long can you walk comfortably? 15-20 mins    Diagnostic tests CT 04/30/21 rule out head/spine trauma post fall.    Patient Stated Goals Decrease risk of falls and improve mobility.    Currently in Pain? No/denies    Pain Score 0-No pain               Treatment  Therapeutic Exercise:    NuStep L2 without UE assist and pt  education on the importance of LE alignment and proper warm up to decrease risk of injury or fall.      All treatment was completed with gait belt donned with CGA-min A to ensure limited fall risk with activity. Pt required great assist min A- Mod A during static balance with eye closed.      // bars with occ light UE assist: 1) lateral walking 3 laps  2) backwards walking 3 laps 3) forward partial lunge  4) 1 mins x 3 Airex marching   Walking in clinic and stair training 4x ascends/descends  without AD and CGA with gait belt donned. Verbal and contact cueing to facilitate optimal tissue loading and correct biomechanical alignment to ensure efficient and safe movement.    Hip hike LLE with handout provided to ensure optimal/proper technique and form.      PT Education - 07/12/21 1611     Education Details Pt was educated on proper L hip hikes to address any confusion on correct movement.    Person(s) Educated Patient    Methods Explanation;Demonstration;Tactile cues;Verbal cues    Comprehension Verbalized understanding;Returned demonstration  PT Long Term Goals - 07/03/21 0945       PT LONG TERM GOAL #1   Title PT will improve FOTO score to predicted improvement value of 56 for improvement self-reported functional ability.    Baseline 52    Time 6    Period Weeks    Status New    Target Date 08/13/21      PT LONG TERM GOAL #2   Title Pt will imrpove BERG balance to by MCID of 5 points to reduce objective fall risk and safer comunnity mobilty.    Baseline 36    Time 6    Period Weeks    Status New    Target Date 08/13/21      PT LONG TERM GOAL #3   Title Pt will increase strength of by at least 1/2 MMT grade in order to demonstrate improvement in strength and function.    Baseline 4-/4 Hip flexion  4-/4 Hip abduction  4-/4 Hip adduction  4-/4 Knee extension  4-/4 Knee flexion 4-/4 Ankle dorsiflexion  4-/4 Ankle plantarflexion    Time 6    Period  Weeks    Status New    Target Date 08/13/21      PT LONG TERM GOAL #4   Title Pt will improve stair mobility with min bilat UE assist during reciprocal gait pattern 4 steps x 3 with occ verbal cueing.    Baseline pt requires heavy UE assist during reciprocal gait patterns and transfer stairs laterally during single rail assist.    Time 6    Period Weeks    Status New    Target Date 08/13/21                   Plan - 07/12/21 1613     Clinical Impression Statement Today's tx was focused on progression of standing dynamic balance. Pt tolerated all tx without increase in pain. Pt was educated on correct L hip hike with hand out. Pt continues to display mark LE endurance, strength, and ROM deficits resulting in decreased safe home/community mobiity, increased pain, and limited access to QOL. Pt. will continue to benefit from skilled physical therapy to progress POC to address remaining deficits to facilitate maximum functional capacity for optimal personal health and wellness for ADLs.    Personal Factors and Comorbidities Age;Comorbidity 2;Fitness;Time since onset of injury/illness/exacerbation    Examination-Activity Limitations Transfers;Bend;Lift;Squat;Stairs;Locomotion Level;Carry;Stand;Dressing    Examination-Participation Restrictions Cleaning;Community Activity;Other    Stability/Clinical Decision Making Evolving/Moderate complexity    Clinical Decision Making Moderate    Rehab Potential Fair    PT Frequency 2x / week    PT Duration 6 weeks    PT Treatment/Interventions ADLs/Self Care Home Management;Aquatic Therapy;Cryotherapy;Moist Heat;Gait training;Stair training;Functional mobility training;Neuromuscular re-education;Balance training;Therapeutic exercise;Therapeutic activities;Patient/family education;Manual techniques;Passive range of motion;Electrical Stimulation;Canalith Repostioning;Ultrasound;Traction;DME Instruction;Dry needling;Energy conservation;Joint  Manipulations;Vestibular;Spinal Manipulations    PT Next Visit Plan Reassess pelvic stabilization during SLS movement.    PT Home Exercise Plan [id number] and [id number]    Consulted and Agree with Plan of Care Patient             Patient will benefit from skilled therapeutic intervention in order to improve the following deficits and impairments:  Abnormal gait, Impaired sensation, Improper body mechanics, Pain, Postural dysfunction, Decreased mobility, Decreased activity tolerance, Decreased endurance, Decreased range of motion, Decreased strength, Hypomobility, Impaired flexibility, Difficulty walking, Decreased balance, Dizziness  Visit Diagnosis: Gait difficulty  Muscle weakness (generalized)  At risk for falls  Right-sided  muscle weakness     Problem List Patient Active Problem List   Diagnosis Date Noted   Spondylolisthesis of lumbar region 05/04/2018   Abnormal glucose level 02/12/2018   Abnormal weight gain 02/12/2018   Allergic rhinitis 02/12/2018   Asthma 02/12/2018   Bradycardia 02/12/2018   Edema 02/12/2018   Heart murmur 02/12/2018   Hypercalcemia 02/12/2018   Hypokalemia 02/12/2018   Pure hypercholesterolemia 02/12/2018   Shoulder joint pain 02/12/2018   Skin sensation disturbance 02/12/2018   Syncope 02/12/2018   Vitamin D deficiency 02/12/2018   Central cervical cord injury, 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, 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) 04/24/2017   Muscle spasticity 04/24/2017   Weight loss 12/19/2016   Hypothyroidism 11/24/2016   Bilateral leg edema 11/24/2016   Cervical radiculopathy 11/24/2016   Disc degeneration, lumbar 11/24/2016   Essential hypertension 11/24/2016   Gallstones 11/24/2016   History of anxiety state 11/24/2016   Hyperlipidemia, unspecified 11/24/2016   Spinal stenosis of lumbar region with neurogenic claudication 11/08/2016   Anxiety 06/08/2015   Cammie Mcgee, PT, DPT # [id number] Lawernce Ion, SPT 07/13/2021, 7:46 PM  [place] [facility] [address] [place], [place], [address] Phone: [phone]   Fax:  [phone]  Name: Evelyn Cisneros MRN: [id number] Date of Birth: January 08, 1941

## 2021-07-16 ENCOUNTER — Encounter: Payer: Self-pay | Admitting: Physical Therapy

## 2021-07-16 ENCOUNTER — Other Ambulatory Visit: Payer: Self-pay

## 2021-07-16 ENCOUNTER — Ambulatory Visit: Payer: Medicare Other | Admitting: Physical Therapy

## 2021-07-16 DIAGNOSIS — R269 Unspecified abnormalities of gait and mobility: Secondary | ICD-10-CM | POA: Diagnosis not present

## 2021-07-16 DIAGNOSIS — Z9181 History of falling: Secondary | ICD-10-CM

## 2021-07-16 DIAGNOSIS — M6281 Muscle weakness (generalized): Secondary | ICD-10-CM

## 2021-07-16 NOTE — Therapy (Signed)
[facility] [address]. [place], [place], [address] Phone: [phone]   Fax:  [phone]  Physical Therapy Treatment  Patient Details  Name: Evelyn Cisneros MRN: [id number] Date of Birth: [birth year] Referring Provider (PT): Pearletha Alfred MD   Encounter Date: 07/16/2021   PT End of Session - 07/16/21 1353     Visit Number 5    Number of Visits 12    Date for PT Re-Evaluation 08/13/21    Authorization - Visit Number 5    Authorization - Number of Visits 10    Progress Note Due on Visit 10    PT Start Time 1346    PT Stop Time 1431    PT Time Calculation (min) 45 min    Equipment Utilized During Treatment Gait belt    Activity Tolerance Patient tolerated treatment well    Behavior During Therapy WFL for tasks assessed/performed             Past Medical History:  Diagnosis Date   Anemia    Arthritis    Heart murmur    Hypertension    Hypothyroidism    Spondylolisthesis of lumbar region     Past Surgical History:  Procedure Laterality Date   ABDOMINAL HYSTERECTOMY     REPLACEMENT TOTAL KNEE BILATERAL     SPINE SURGERY      There were no vitals filed for this visit.   Subjective Assessment - 07/16/21 1349     Subjective Pt presents to tx without any reports of pain. Pt reports no falls or near falls over the weekend. Pt stated that she wore her tennis shoes for better support over the weekend. Pt presents with tennis shoes at tx today.    Limitations Lifting;Standing;Walking;House hold activities    How long can you sit comfortably? WNL    How long can you stand comfortably? 45 mins    How long can you walk comfortably? 15-20 mins    Diagnostic tests CT 04/30/21 rule out head/spine trauma post fall.    Patient Stated Goals Decrease risk of falls and improve mobility.    Currently in Pain? No/denies    Pain Score 0-No pain               Treatment  Therapeutic Exercise:    NuStep L3 without UE assist  and pt education on the importance of LE alignment and proper warm up to decrease risk of injury or fall.    All treatment was completed with gait belt donned with CGA-min A to ensure limited fall risk with activity. Pt required great assist min A- Mod A during static balance with eye closed.     12'' toe tapping at stairs 1 mins x 3. Progression from bilat UE assist to R UE assist to no UE assist. Verbal and contact cueing to facilitate optimal tissue loading and correct biomechanical alignment to ensure efficient and safe movement.    Outdoor amb 655ft over varying terrain with stairs, roots, curbs, and uneven grassed surfaces. CGA with gait belt donned, however, no LOB was noted.        PT Education - 07/16/21 1351     Education Details Pt was educated on proper form and technique during standing mobility to decrease fall risk during tx.    Person(s) Educated Patient    Methods Explanation;Demonstration;Tactile cues;Verbal cues    Comprehension Verbalized understanding;Returned demonstration;Need further instruction  PT Long Term Goals - 07/03/21 0945       PT LONG TERM GOAL #1   Title PT will improve FOTO score to predicted improvement value of 56 for improvement self-reported functional ability.    Baseline 52    Time 6    Period Weeks    Status New    Target Date 08/13/21      PT LONG TERM GOAL #2   Title Pt will imrpove BERG balance to by MCID of 5 points to reduce objective fall risk and safer comunnity mobilty.    Baseline 36    Time 6    Period Weeks    Status New    Target Date 08/13/21      PT LONG TERM GOAL #3   Title Pt will increase strength of by at least 1/2 MMT grade in order to demonstrate improvement in strength and function.    Baseline 4-/4 Hip flexion  4-/4 Hip abduction  4-/4 Hip adduction  4-/4 Knee extension  4-/4 Knee flexion 4-/4 Ankle dorsiflexion  4-/4 Ankle plantarflexion    Time 6    Period Weeks    Status New     Target Date 08/13/21      PT LONG TERM GOAL #4   Title Pt will improve stair mobility with min bilat UE assist during reciprocal gait pattern 4 steps x 3 with occ verbal cueing.    Baseline pt requires heavy UE assist during reciprocal gait patterns and transfer stairs laterally during single rail assist.    Time 6    Period Weeks    Status New    Target Date 08/13/21                   Plan - 07/16/21 1354     Clinical Impression Statement Today's tx was focused on assessment of functional abilities during uneven ground amb. Pt displayed marked stress-with-movement during curb stepping and requested arm assist from PT to perform task. Pt was able to complete tx without increase in pain. Pt continues to display mark LE endurance, strength, and ROM deficits resulting in decreased safe home/community mobiity, increased pain, and limited access to QOL. Pt. will continue to benefit from skilled physical therapy to progress POC to address remaining deficits to facilitate maximum functional capacity for optimal personal health and wellness for ADLs.    Personal Factors and Comorbidities Age;Comorbidity 2;Fitness;Time since onset of injury/illness/exacerbation    Examination-Activity Limitations Transfers;Bend;Lift;Squat;Stairs;Locomotion Level;Carry;Stand;Dressing    Examination-Participation Restrictions Cleaning;Community Activity;Other    Stability/Clinical Decision Making Evolving/Moderate complexity    Clinical Decision Making Moderate    Rehab Potential Fair    PT Frequency 2x / week    PT Duration 6 weeks    PT Treatment/Interventions ADLs/Self Care Home Management;Aquatic Therapy;Cryotherapy;Moist Heat;Gait training;Stair training;Functional mobility training;Neuromuscular re-education;Balance training;Therapeutic exercise;Therapeutic activities;Patient/family education;Manual techniques;Passive range of motion;Electrical Stimulation;Canalith Repostioning;Ultrasound;Traction;DME  Instruction;Dry needling;Energy conservation;Joint Manipulations;Vestibular;Spinal Manipulations    PT Next Visit Plan Dynamic balance tasks/ uneven terrain/ hip strengthening.    PT Home Exercise Plan [id number] and [id number]    Consulted and Agree with Plan of Care Patient             Patient will benefit from skilled therapeutic intervention in order to improve the following deficits and impairments:  Abnormal gait, Impaired sensation, Improper body mechanics, Pain, Postural dysfunction, Decreased mobility, Decreased activity tolerance, Decreased endurance, Decreased range of motion, Decreased strength, Hypomobility, Impaired flexibility, Difficulty walking, Decreased balance, Dizziness  Visit Diagnosis: Gait difficulty  Muscle weakness (generalized)  At risk for falls  Right-sided muscle weakness     Problem List Patient Active Problem List   Diagnosis Date Noted   Spondylolisthesis of lumbar region 05/04/2018   Abnormal glucose level 02/12/2018   Abnormal weight gain 02/12/2018   Allergic rhinitis 02/12/2018   Asthma 02/12/2018   Bradycardia 02/12/2018   Edema 02/12/2018   Heart murmur 02/12/2018   Hypercalcemia 02/12/2018   Hypokalemia 02/12/2018   Pure hypercholesterolemia 02/12/2018   Shoulder joint pain 02/12/2018   Skin sensation disturbance 02/12/2018   Syncope 02/12/2018   Vitamin D deficiency 02/12/2018   Central cervical cord injury, 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. [place], [place], [address] Phone: [phone]   Fax:  [phone]  Name: Evelyn Cisneros MRN: [id number] Date of Birth: 07/13/1941

## 2021-07-23 ENCOUNTER — Other Ambulatory Visit: Payer: Self-pay

## 2021-07-23 ENCOUNTER — Ambulatory Visit: Payer: Medicare Other | Attending: Neurology | Admitting: Physical Therapy

## 2021-07-23 ENCOUNTER — Encounter: Payer: Self-pay | Admitting: Physical Therapy

## 2021-07-23 DIAGNOSIS — M6281 Muscle weakness (generalized): Secondary | ICD-10-CM | POA: Diagnosis present

## 2021-07-23 DIAGNOSIS — Z9181 History of falling: Secondary | ICD-10-CM | POA: Diagnosis present

## 2021-07-23 DIAGNOSIS — R269 Unspecified abnormalities of gait and mobility: Secondary | ICD-10-CM | POA: Insufficient documentation

## 2021-07-23 NOTE — Therapy (Signed)
[facility] [address]. [place], [place], [address] Phone: [phone]   Fax:  [phone]  Physical Therapy Treatment  Patient Details  Name: Evelyn Cisneros MRN: [id number] Date of Birth: Aug 29, 1941 Referring Provider (PT): Pearletha Alfred MD   Encounter Date: 07/23/2021   PT End of Session - 07/23/21 1254     Visit Number 6    Number of Visits 12    Date for PT Re-Evaluation 08/13/21    Authorization - Visit Number 6    Authorization - Number of Visits 10    Progress Note Due on Visit 10    PT Start Time 1254    Equipment Utilized During Treatment Gait belt    Activity Tolerance Patient tolerated treatment well    Behavior During Therapy [facility] for tasks assessed/performed             Past Medical History:  Diagnosis Date   Anemia    Arthritis    Heart murmur    Hypertension    Hypothyroidism    Spondylolisthesis of lumbar region     Past Surgical History:  Procedure Laterality Date   ABDOMINAL HYSTERECTOMY     REPLACEMENT TOTAL KNEE BILATERAL     SPINE SURGERY      There were no vitals filed for this visit.           Treatment   Therapeutic Exercise:   NuStep L4 without UE assist and pt education on the importance of LE alignment and proper warm up to decrease risk of injury or fall.   Standing marching/ hip abduction/ extension in //-bars 20x.    Neuro mm.:     Airex step ups/ overs:  B UE with progression to 1 UE assist and CGA.  Pt. Unable to complete without UE assist for safety.   Standing wt. Shifting/ marching on Airex.    Lateral walking in //-bars with light to no UE assist.  Cuing to increase hip flexion/ step length.        All treatment was completed with gait belt donned with CGA-min A to ensure limited fall risk with activity.     Tandem gait/ turning/ cone taps (CGA/min. A for safety when not using UE assist on //-bars).    6'' toe tapping at stairs 1 mins x 3. Progression  from bilat UE assist to R UE assist to no UE assist. Verbal and contact cueing to facilitate optimal tissue loading and correct biomechanical alignment to ensure efficient and safe movement.   Step ups/downs at stairs with UE required for safety 10x.    Amb. In clinic/ outside with use of SPC and consistent 2-point gait pattern with CGA for safety/ cuing.    PT Long Term Goals - 07/03/21 0945       PT LONG TERM GOAL #1   Title PT will improve FOTO score to predicted improvement value of 56 for improvement self-reported functional ability.    Baseline 52    Time 6    Period Weeks    Status New    Target Date 08/13/21      PT LONG TERM GOAL #2   Title Pt will imrpove BERG balance to by MCID of 5 points to reduce objective fall risk and safer comunnity mobilty.    Baseline 36    Time 6    Period Weeks    Status New    Target Date 08/13/21      PT LONG TERM  GOAL #3   Title Pt will increase strength of by at least 1/2 MMT grade in order to demonstrate improvement in strength and function.    Baseline 4-/4 Hip flexion  4-/4 Hip abduction  4-/4 Hip adduction  4-/4 Knee extension  4-/4 Knee flexion 4-/4 Ankle dorsiflexion  4-/4 Ankle plantarflexion    Time 6    Period Weeks    Status New    Target Date 08/13/21      PT LONG TERM GOAL #4   Title Pt will improve stair mobility with min bilat UE assist during reciprocal gait pattern 4 steps x 3 with occ verbal cueing.    Baseline pt requires heavy UE assist during reciprocal gait patterns and transfer stairs laterally during single rail assist.    Time 6    Period Weeks    Status New    Target Date 08/13/21             Pt. challenged with dynamic balance tasks in //-bars with light to no UE assist.  Pt. benefits from use of UE for safety, esp. with step overs/ turning/ NBOS and eyes closed tasks.  No increase c/o pain but fatigue/ LE muscle weakness noted during prolonged standing and there.x.  Pt. will continue to benefit from  progression in strengthening ex./ dynamic balance tasks to improve safety/ decrease fall risk.       Patient will benefit from skilled therapeutic intervention in order to improve the following deficits and impairments:     Visit Diagnosis: Gait difficulty  Muscle weakness (generalized)  At risk for falls  Right-sided muscle weakness     Problem List Patient Active Problem List   Diagnosis Date Noted   Spondylolisthesis of lumbar region 05/04/2018   Abnormal glucose level 02/12/2018   Abnormal weight gain 02/12/2018   Allergic rhinitis 02/12/2018   Asthma 02/12/2018   Bradycardia 02/12/2018   Edema 02/12/2018   Heart murmur 02/12/2018   Hypercalcemia 02/12/2018   Hypokalemia 02/12/2018   Pure hypercholesterolemia 02/12/2018   Shoulder joint pain 02/12/2018   Skin sensation disturbance 02/12/2018   Syncope 02/12/2018   Vitamin D deficiency 02/12/2018   Central cervical cord injury, 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, 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 [place], [place], [address] Phone: [phone]   Fax:  [phone]  Name: Evelyn Cisneros MRN: [id number] Date of Birth: 06/28/1941

## 2021-07-26 ENCOUNTER — Other Ambulatory Visit: Payer: Self-pay

## 2021-07-26 ENCOUNTER — Ambulatory Visit: Payer: Medicare Other | Admitting: Physical Therapy

## 2021-07-26 DIAGNOSIS — M6281 Muscle weakness (generalized): Secondary | ICD-10-CM

## 2021-07-26 DIAGNOSIS — R269 Unspecified abnormalities of gait and mobility: Secondary | ICD-10-CM

## 2021-07-26 DIAGNOSIS — Z9181 History of falling: Secondary | ICD-10-CM

## 2021-07-28 NOTE — Therapy (Addendum)
[facility] [address]. [place], [place], [address] Phone: [phone]   Fax:  [phone]  Physical Therapy Treatment  Patient Details  Name: Evelyn Cisneros MRN: [id number] Date of Birth: 1941/06/11 Referring Provider (PT): Pearletha Alfred MD   Encounter Date: 07/26/2021     PT End of Session - 07/28/21 1856     Visit Number 7    Number of Visits 12    Date for PT Re-Evaluation 08/13/21    Authorization - Visit Number 7    Authorization - Number of Visits 10    Progress Note Due on Visit 10    PT Start Time 0939    PT Stop Time 1027    PT Time Calculation (min) 48 min    Equipment Utilized During Treatment Gait belt    Activity Tolerance Patient tolerated treatment well    Behavior During Therapy WFL for tasks assessed/performed               Past Medical History:  Diagnosis Date   Anemia    Arthritis    Heart murmur    Hypertension    Hypothyroidism    Spondylolisthesis of lumbar region     Past Surgical History:  Procedure Laterality Date   ABDOMINAL HYSTERECTOMY     REPLACEMENT TOTAL KNEE BILATERAL     SPINE SURGERY      There were no vitals filed for this visit.   Subjective Assessment - 07/30/21 0834     Subjective Pt. reports no pain or recent falls.  Pt. entered PT with use of SPC.    Limitations Lifting;Standing;Walking;House hold activities    How long can you sit comfortably? WNL    How long can you stand comfortably? 45 mins    How long can you walk comfortably? 15-20 mins    Diagnostic tests CT 04/30/21 rule out head/spine trauma post fall.    Patient Stated Goals Decrease risk of falls and improve mobility.    Currently in Pain? No/denies    Pain Score 0-No pain             Treatment:    Therapeutic Exercise:  Nustep L4 to L3 at 5 min. Mark (8 min. Total) with L UE assist as needed and pt. Education on the importance of LE alignment and proper warm up to decrease risk of injury  or fall  Standing marching/ hip abduction/ extension/ toe and heel wt. Shifting in //-bars 20x.     Neuro. Mm.:  Hallway walking with/without use of SPC.  Working on recip. Pattern/ heel strike/ upright posture.  6"/12" step touches and lateral wt. Shifting with 1 UE assist required.  R hip/LE muscle fatigue noted over last 3 reps.  Walking in //-bars with recip. Cone touches (progressing to light single UE assist).    Airex step ups/ overs: B UE with progression to 1 UE assist and CGA.  Pt. Unable to complete without UE assist for safety.    Standing marching on Airex/ wt. Shifting/ step ups/ downs.   All treatment was completed with gait belt donned with CGA-min. A to ensure limited fall risk with activity.        PT Long Term Goals - 07/03/21 0945       PT LONG TERM GOAL #1   Title PT will improve FOTO score to predicted improvement value of 56 for improvement self-reported functional ability.    Baseline 52    Time 6  Period Weeks    Status New    Target Date 08/13/21      PT LONG TERM GOAL #2   Title Pt will imrpove BERG balance to by MCID of 5 points to reduce objective fall risk and safer comunnity mobilty.    Baseline 36    Time 6    Period Weeks    Status New    Target Date 08/13/21      PT LONG TERM GOAL #3   Title Pt will increase strength of by at least 1/2 MMT grade in order to demonstrate improvement in strength and function.    Baseline 4-/4 Hip flexion  4-/4 Hip abduction  4-/4 Hip adduction  4-/4 Knee extension  4-/4 Knee flexion 4-/4 Ankle dorsiflexion  4-/4 Ankle plantarflexion    Time 6    Period Weeks    Status New    Target Date 08/13/21      PT LONG TERM GOAL #4   Title Pt will improve stair mobility with min bilat UE assist during reciprocal gait pattern 4 steps x 3 with occ verbal cueing.    Baseline pt requires heavy UE assist during reciprocal gait patterns and transfer stairs laterally during single rail assist.    Time 6    Period  Weeks    Status New    Target Date 08/13/21                   Plan - 07/30/21 0850     Clinical Impression Statement PT tx. session focused on LE strengthening and progressive balance tasks to improve safety/ independence with walking.  Pt. requires light UE assist with all walking/balance tasks and extra time/focus with turning.  Pt. unable to complete Airex step ups and cone taps without at least 1 UE assist.  Several seated rest breaks recommended t/o tx. session, esp. after resisted gait.  Pt. will continue to benefit from skilled PT services to increase LE strengthening and safety with walking.    Personal Factors and Comorbidities Age;Comorbidity 2;Fitness;Time since onset of injury/illness/exacerbation    Examination-Activity Limitations Transfers;Bend;Lift;Squat;Stairs;Locomotion Level;Carry;Stand;Dressing    Examination-Participation Restrictions Cleaning;Community Activity;Other    Stability/Clinical Decision Making Evolving/Moderate complexity    Clinical Decision Making Moderate    Rehab Potential Fair    PT Frequency 2x / week    PT Duration 6 weeks    PT Treatment/Interventions ADLs/Self Care Home Management;Aquatic Therapy;Cryotherapy;Moist Heat;Gait training;Stair training;Functional mobility training;Neuromuscular re-education;Balance training;Therapeutic exercise;Therapeutic activities;Patient/family education;Manual techniques;Passive range of motion;Electrical Stimulation;Canalith Repostioning;Ultrasound;Traction;DME Instruction;Dry needling;Energy conservation;Joint Manipulations;Vestibular;Spinal Manipulations    PT Next Visit Plan Dynamic balance tasks/ uneven terrain/ hip strengthening.    PT Home Exercise Plan [id number] and [id number]    Consulted and Agree with Plan of Care Patient             Patient will benefit from skilled therapeutic intervention in order to improve the following deficits and impairments:  Abnormal gait, Impaired sensation, Improper  body mechanics, Pain, Postural dysfunction, Decreased mobility, Decreased activity tolerance, Decreased endurance, Decreased range of motion, Decreased strength, Hypomobility, Impaired flexibility, Difficulty walking, Decreased balance, Dizziness  Visit Diagnosis: Gait difficulty  Muscle weakness (generalized)  At risk for falls  Right-sided muscle weakness     Problem List Patient Active Problem List   Diagnosis Date Noted   Spondylolisthesis of lumbar region 05/04/2018   Abnormal glucose level 02/12/2018   Abnormal weight gain 02/12/2018   Allergic rhinitis 02/12/2018   Asthma 02/12/2018   Bradycardia 02/12/2018  Edema 02/12/2018   Heart murmur 02/12/2018   Hypercalcemia 02/12/2018   Hypokalemia 02/12/2018   Pure hypercholesterolemia 02/12/2018   Shoulder joint pain 02/12/2018   Skin sensation disturbance 02/12/2018   Syncope 02/12/2018   Vitamin D deficiency 02/12/2018   Central cervical cord injury, 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, 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) 04/24/2017   Muscle spasticity 04/24/2017   Weight loss 12/19/2016   Hypothyroidism 11/24/2016   Bilateral leg edema 11/24/2016   Cervical radiculopathy 11/24/2016   Disc degeneration, lumbar 11/24/2016   Essential hypertension 11/24/2016   Gallstones 11/24/2016   History of anxiety state 11/24/2016   Hyperlipidemia, unspecified 11/24/2016   Spinal stenosis of lumbar region with neurogenic claudication 11/08/2016   Anxiety 06/08/2015   Cammie Mcgee, PT, DPT # [phone] 07/30/2021, 9:13 AM  [facility] [address] [place], [place], [address] Phone: [phone]   Fax:  [phone]  Name: Evelyn Cisneros MRN: [id number] Date of Birth: 07/08/1941

## 2021-07-30 ENCOUNTER — Ambulatory Visit: Payer: Medicare Other | Admitting: Physical Therapy

## 2021-07-30 ENCOUNTER — Other Ambulatory Visit: Payer: Self-pay

## 2021-07-30 ENCOUNTER — Encounter: Payer: Self-pay | Admitting: Physical Therapy

## 2021-07-30 DIAGNOSIS — R269 Unspecified abnormalities of gait and mobility: Secondary | ICD-10-CM | POA: Diagnosis not present

## 2021-07-30 DIAGNOSIS — Z9181 History of falling: Secondary | ICD-10-CM

## 2021-07-30 DIAGNOSIS — M6281 Muscle weakness (generalized): Secondary | ICD-10-CM

## 2021-07-30 NOTE — Therapy (Signed)
[facility] [address] [place], [place], [address] Phone: [phone]   Fax:  [phone]  Physical Therapy Treatment  Patient Details  Name: Evelyn Cisneros MRN: [id number] Date of Birth: 1940/12/23 Referring Provider (PT): Pearletha Alfred MD   Encounter Date: 07/30/2021   PT End of Session - 07/30/21 1025     Visit Number 8    Number of Visits 12    Date for PT Re-Evaluation 08/13/21    Authorization - Visit Number 8    Authorization - Number of Visits 10    Progress Note Due on Visit 10    PT Start Time 1025    PT Stop Time 1115    PT Time Calculation (min) 50 min    Equipment Utilized During Treatment Gait belt    Activity Tolerance Patient tolerated treatment well    Behavior During Therapy WFL for tasks assessed/performed             Past Medical History:  Diagnosis Date   Anemia    Arthritis    Heart murmur    Hypertension    Hypothyroidism    Spondylolisthesis of lumbar region     Past Surgical History:  Procedure Laterality Date   ABDOMINAL HYSTERECTOMY     REPLACEMENT TOTAL KNEE BILATERAL     SPINE SURGERY      There were no vitals filed for this visit.   Subjective Assessment - 07/30/21 1025     Subjective Pt. states she did a lot of laundry over the weekend.  No falls or loss of balance.    Limitations Lifting;Standing;Walking;House hold activities    How long can you sit comfortably? WNL    How long can you stand comfortably? 45 mins    How long can you walk comfortably? 15-20 mins    Diagnostic tests CT 04/30/21 rule out head/spine trauma post fall.    Patient Stated Goals Decrease risk of falls and improve mobility.    Currently in Pain? No/denies               Treatment:      Therapeutic Exercise:   Nustep L3 for 10 min. with B UE/LE assist as needed and pt. Education on the importance of LE alignment and proper warm up to decrease risk of injury or fall   Seated LE ankle wts.  3#: Standing marching/ hip abduction/ extension/ toe and heel wt. Shifting in //-bars 20x.       Neuro. Mm.:  Alt. UE/LE touches.    3" step overs/ 6" step ups with cuing to use less UE assist.     12" step touches with 3# ankle wts.  R hip/LE muscle fatigue noted over few reps.   Tandem stance/ NBOS with EO and EC   Ambulate in PT gym without SPC but requires SBA/CGA for safety and cuing.       All treatment was completed with gait belt donned with CGA-min. A to ensure limited fall risk with activity.            PT Long Term Goals - 07/03/21 0945       PT LONG TERM GOAL #1   Title PT will improve FOTO score to predicted improvement value of 56 for improvement self-reported functional ability.    Baseline 52    Time 6    Period Weeks    Status New    Target Date 08/13/21      PT LONG  TERM GOAL #2   Title Pt will imrpove BERG balance to by MCID of 5 points to reduce objective fall risk and safer comunnity mobilty.    Baseline 36    Time 6    Period Weeks    Status New    Target Date 08/13/21      PT LONG TERM GOAL #3   Title Pt will increase strength of by at least 1/2 MMT grade in order to demonstrate improvement in strength and function.    Baseline 4-/4 Hip flexion  4-/4 Hip abduction  4-/4 Hip adduction  4-/4 Knee extension  4-/4 Knee flexion 4-/4 Ankle dorsiflexion  4-/4 Ankle plantarflexion    Time 6    Period Weeks    Status New    Target Date 08/13/21      PT LONG TERM GOAL #4   Title Pt will improve stair mobility with min bilat UE assist during reciprocal gait pattern 4 steps x 3 with occ verbal cueing.    Baseline pt requires heavy UE assist during reciprocal gait patterns and transfer stairs laterally during single rail assist.    Time 6    Period Weeks    Status New    Target Date 08/13/21                   Plan - 07/30/21 1026     Clinical Impression Statement Marked R hip flexor muscle fatigue with repeated resisted ther.ex. using  3# ankle wts.  Pt. completes consistent 3" step overs with good hip/knee control and light UE assist.  Pt. challenged with tandem stance and EC stance in //-bars.  No loss of balance but occasional light UE assist required to reassess balance.  No change to HEP.    Personal Factors and Comorbidities Age;Comorbidity 2;Fitness;Time since onset of injury/illness/exacerbation    Examination-Activity Limitations Transfers;Bend;Lift;Squat;Stairs;Locomotion Level;Carry;Stand;Dressing    Examination-Participation Restrictions Cleaning;Community Activity;Other    Stability/Clinical Decision Making Evolving/Moderate complexity    Clinical Decision Making Moderate    Rehab Potential Fair    PT Frequency 2x / week    PT Duration 6 weeks    PT Treatment/Interventions ADLs/Self Care Home Management;Aquatic Therapy;Cryotherapy;Moist Heat;Gait training;Stair training;Functional mobility training;Neuromuscular re-education;Balance training;Therapeutic exercise;Therapeutic activities;Patient/family education;Manual techniques;Passive range of motion;Electrical Stimulation;Canalith Repostioning;Ultrasound;Traction;DME Instruction;Dry needling;Energy conservation;Joint Manipulations;Vestibular;Spinal Manipulations    PT Next Visit Plan Dynamic balance tasks/ uneven terrain/ hip strengthening.  DISCUSS BOTOX injection to R elbow/forearm.    PT Home Exercise Plan [id number] and [id number]    Consulted and Agree with Plan of Care Patient             Patient will benefit from skilled therapeutic intervention in order to improve the following deficits and impairments:  Abnormal gait, Impaired sensation, Improper body mechanics, Pain, Postural dysfunction, Decreased mobility, Decreased activity tolerance, Decreased endurance, Decreased range of motion, Decreased strength, Hypomobility, Impaired flexibility, Difficulty walking, Decreased balance, Dizziness  Visit Diagnosis: Gait difficulty  Muscle weakness  (generalized)  At risk for falls  Right-sided muscle weakness     Problem List Patient Active Problem List   Diagnosis Date Noted   Spondylolisthesis of lumbar region 05/04/2018   Abnormal glucose level 02/12/2018   Abnormal weight gain 02/12/2018   Allergic rhinitis 02/12/2018   Asthma 02/12/2018   Bradycardia 02/12/2018   Edema 02/12/2018   Heart murmur 02/12/2018   Hypercalcemia 02/12/2018   Hypokalemia 02/12/2018   Pure hypercholesterolemia 02/12/2018   Shoulder joint pain 02/12/2018   Skin sensation disturbance 02/12/2018  Syncope 02/12/2018   Vitamin D deficiency 02/12/2018   Central cervical cord injury, 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 [place], [place], [address] Phone: [phone]   Fax:  [phone]  Name: Evelyn Cisneros MRN: [id number] Date of Birth: [birth year]

## 2021-08-02 ENCOUNTER — Ambulatory Visit: Payer: Medicare Other | Admitting: Physical Therapy

## 2021-08-02 ENCOUNTER — Other Ambulatory Visit: Payer: Self-pay

## 2021-08-02 DIAGNOSIS — R269 Unspecified abnormalities of gait and mobility: Secondary | ICD-10-CM

## 2021-08-02 DIAGNOSIS — M6281 Muscle weakness (generalized): Secondary | ICD-10-CM

## 2021-08-02 DIAGNOSIS — Z9181 History of falling: Secondary | ICD-10-CM

## 2021-08-06 ENCOUNTER — Ambulatory Visit: Payer: Medicare Other | Admitting: Physical Therapy

## 2021-08-07 ENCOUNTER — Encounter: Payer: Self-pay | Admitting: Physical Therapy

## 2021-08-07 NOTE — Therapy (Signed)
[facility] [address] [place], [place], [address] Phone: [phone]   Fax:  [phone]  Physical Therapy Treatment  Patient Details  Name: Evelyn Cisneros MRN: [id number] Date of Birth: [birth year] Referring Provider (PT): Pearletha Alfred MD   Encounter Date: 08/02/2021   PT End of Session - 08/07/21 0751     Visit Number 9    Number of Visits 12    Date for PT Re-Evaluation 08/13/21    Authorization - Visit Number 9    Authorization - Number of Visits 10    Progress Note Due on Visit 10    PT Start Time 1021    PT Stop Time 1111    PT Time Calculation (min) 50 min    Equipment Utilized During Treatment Gait belt    Activity Tolerance Patient tolerated treatment well    Behavior During Therapy WFL for tasks assessed/performed             Past Medical History:  Diagnosis Date   Anemia    Arthritis    Heart murmur    Hypertension    Hypothyroidism    Spondylolisthesis of lumbar region     Past Surgical History:  Procedure Laterality Date   ABDOMINAL HYSTERECTOMY     REPLACEMENT TOTAL KNEE BILATERAL     SPINE SURGERY      There were no vitals filed for this visit.   Subjective Assessment - 08/07/21 0750     Subjective Pt. received Botox to R forearm/elbow to decrease contractures.  No new complaints today.    Limitations Lifting;Standing;Walking;House hold activities    How long can you sit comfortably? WNL    How long can you stand comfortably? 45 mins    How long can you walk comfortably? 15-20 mins    Diagnostic tests CT 04/30/21 rule out head/spine trauma post fall.    Patient Stated Goals Decrease risk of falls and improve mobility.    Currently in Pain? No/denies             Treatment:     Therapeutic Exercise:   Nustep L3 for 10 min. with B UE/LE assist as needed and pt. Education on the importance of LE alignment and proper warm up to decrease risk of injury or fall   Supine LE ex.: SAQ  with bolster 20x/ hip flexion 20x/ SLR 20x/ hamstring stretching 5x with static holds.  Supine manual isometric hip ex. 10x (moderate resistance).     Neuro. Mm.:   Alt. UE/LE touches in //-bars with mirror feedback.   6" green hurdle step overs with cuing to use less UE assist.  Pt. Requires HHA for safety and cuing to prevent R hip circumduction.     Ambulate in PT gym without SPC but requires SBA/CGA for safety and cuing.  Increase hip flexion with mirror feedback to correct posture.     All treatment was completed with gait belt donned with CGA-min. A to ensure limited fall risk with activity.              PT Long Term Goals - 07/03/21 0945       PT LONG TERM GOAL #1   Title PT will improve FOTO score to predicted improvement value of 56 for improvement self-reported functional ability.    Baseline 52    Time 6    Period Weeks    Status New    Target Date 08/13/21      PT LONG  TERM GOAL #2   Title Pt will imrpove BERG balance to by MCID of 5 points to reduce objective fall risk and safer comunnity mobilty.    Baseline 36    Time 6    Period Weeks    Status New    Target Date 08/13/21      PT LONG TERM GOAL #3   Title Pt will increase strength of by at least 1/2 MMT grade in order to demonstrate improvement in strength and function.    Baseline 4-/4 Hip flexion  4-/4 Hip abduction  4-/4 Hip adduction  4-/4 Knee extension  4-/4 Knee flexion 4-/4 Ankle dorsiflexion  4-/4 Ankle plantarflexion    Time 6    Period Weeks    Status New    Target Date 08/13/21      PT LONG TERM GOAL #4   Title Pt will improve stair mobility with min bilat UE assist during reciprocal gait pattern 4 steps x 3 with occ verbal cueing.    Baseline pt requires heavy UE assist during reciprocal gait patterns and transfer stairs laterally during single rail assist.    Time 6    Period Weeks    Status New    Target Date 08/13/21                   Plan - 08/07/21 0752      Clinical Impression Statement Limited R hip flexion/ recip. step pattern noted esp. with increase muscle fatigue during ther.ex./ balance tasks.  No LOB during tx. session and several seated rest breaks taken between tasks.  CGA/min. A with stepping over hurdles in hallway to ensure safety/ proper technique.  R hip circumduction with step overs secondary to R hip flexor weakness/ fatigue.  No change to HEP and pt. instructed to continue with daily walking/ HEP.    Personal Factors and Comorbidities Age;Comorbidity 2;Fitness;Time since onset of injury/illness/exacerbation    Examination-Activity Limitations Transfers;Bend;Lift;Squat;Stairs;Locomotion Level;Carry;Stand;Dressing    Examination-Participation Restrictions Cleaning;Community Activity;Other    Stability/Clinical Decision Making Evolving/Moderate complexity    Clinical Decision Making Moderate    Rehab Potential Fair    PT Frequency 2x / week    PT Duration 6 weeks    PT Treatment/Interventions ADLs/Self Care Home Management;Aquatic Therapy;Cryotherapy;Moist Heat;Gait training;Stair training;Functional mobility training;Neuromuscular re-education;Balance training;Therapeutic exercise;Therapeutic activities;Patient/family education;Manual techniques;Passive range of motion;Electrical Stimulation;Canalith Repostioning;Ultrasound;Traction;DME Instruction;Dry needling;Energy conservation;Joint Manipulations;Vestibular;Spinal Manipulations    PT Next Visit Plan Dynamic balance tasks/ uneven terrain/ hip strengthening.    PT Home Exercise Plan [id number] and [id number]    Consulted and Agree with Plan of Care Patient             Patient will benefit from skilled therapeutic intervention in order to improve the following deficits and impairments:  Abnormal gait, Impaired sensation, Improper body mechanics, Pain, Postural dysfunction, Decreased mobility, Decreased activity tolerance, Decreased endurance, Decreased range of motion, Decreased  strength, Hypomobility, Impaired flexibility, Difficulty walking, Decreased balance, Dizziness  Visit Diagnosis: Gait difficulty  Muscle weakness (generalized)  At risk for falls  Right-sided muscle weakness     Problem List Patient Active Problem List   Diagnosis Date Noted   Spondylolisthesis of lumbar region 05/04/2018   Abnormal glucose level 02/12/2018   Abnormal weight gain 02/12/2018   Allergic rhinitis 02/12/2018   Asthma 02/12/2018   Bradycardia 02/12/2018   Edema 02/12/2018   Heart murmur 02/12/2018   Hypercalcemia 02/12/2018   Hypokalemia 02/12/2018   Pure hypercholesterolemia 02/12/2018   Shoulder joint pain 02/12/2018  Skin sensation disturbance 02/12/2018   Syncope 02/12/2018   Vitamin D deficiency 02/12/2018   Central cervical cord injury, 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, 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 [place], [place], [address] Phone: [phone]   Fax:  [phone]  Name: Emmalie Haigh MRN: [id number] Date of Birth: [birth year]

## 2021-08-09 ENCOUNTER — Other Ambulatory Visit: Payer: Self-pay

## 2021-08-09 ENCOUNTER — Ambulatory Visit: Payer: Medicare Other | Admitting: Physical Therapy

## 2021-08-09 DIAGNOSIS — R269 Unspecified abnormalities of gait and mobility: Secondary | ICD-10-CM

## 2021-08-09 DIAGNOSIS — M6281 Muscle weakness (generalized): Secondary | ICD-10-CM

## 2021-08-09 DIAGNOSIS — Z9181 History of falling: Secondary | ICD-10-CM

## 2021-08-11 NOTE — Therapy (Signed)
[facility] [address] [place], [place], [address] Phone: [phone]   Fax:  [phone]  Physical Therapy Treatment Physical Therapy Progress Note   Dates of reporting period  07/02/2021   to   08/09/2021  Patient Details  Name: Evelyn Cisneros MRN: [id number] Date of Birth: 1941/02/20 Referring Provider (PT): Gypsy Decant MD   Encounter Date: 08/09/2021   PT End of Session - 08/11/21 1254     Visit Number 10    Number of Visits 12    Date for PT Re-Evaluation 08/13/21    Authorization - Visit Number 10    Authorization - Number of Visits 10    Progress Note Due on Visit 10    PT Start Time 1025    PT Stop Time 1102    PT Time Calculation (min) 37 min    Equipment Utilized During Treatment Gait belt    Activity Tolerance Patient tolerated treatment well    Behavior During Therapy WFL for tasks assessed/performed             Past Medical History:  Diagnosis Date   Anemia    Arthritis    Heart murmur    Hypertension    Hypothyroidism    Spondylolisthesis of lumbar region     Past Surgical History:  Procedure Laterality Date   ABDOMINAL HYSTERECTOMY     REPLACEMENT TOTAL KNEE BILATERAL     SPINE SURGERY      There were no vitals filed for this visit.   Subjective Assessment - 08/11/21 1240     Subjective Pt. states she has limited R hand grasp today due to Botox injection.  No falls reported.    Limitations Lifting;Standing;Walking;House hold activities    How long can you sit comfortably? WNL    How long can you stand comfortably? 45 mins    How long can you walk comfortably? 15-20 mins    Diagnostic tests CT 04/30/21 rule out head/spine trauma post fall.    Patient Stated Goals Decrease risk of falls and improve mobility.    Currently in Pain? No/denies             Nustep L3 for 10 min. with L UE/ B LE assist as needed and pt. Education on the importance of LE alignment and proper warm up to  decrease risk of injury or fall    Neuro. Mm.:   Alt. UE/LE touches in //-bars with mirror feedback.   Ambulate in PT gym without SPC but requires SBA/CGA for safety and cuing.  Increase hip flexion with mirror feedback to correct posture.  Gait on blue mat/ uneven terrain in //-bars with focus on ankle/ knee control.  Recip. Stair ascending/ descending with L UE assist on railing. 4 steps x 3.  Hallway obstacle course: maneuvering cones/ cone taps/ [employer].      All treatment was completed with gait belt donned with CGA-min. A to ensure limited fall risk with activity.          PT Long Term Goals - 08/11/21 1309       PT LONG TERM GOAL #1   Title PT will improve FOTO score to predicted improvement value of 56 for improvement self-reported functional ability.    Baseline 52    Time 6    Period Weeks    Status On-going    Target Date 08/13/21      PT LONG TERM GOAL #2   Title Pt will imrpove  BERG balance to by MCID of 5 points to reduce objective fall risk and safer comunnity mobilty.    Baseline 36    Time 6    Period Weeks    Status On-going    Target Date 08/13/21      PT LONG TERM GOAL #3   Title Pt will increase strength of by at least 1/2 MMT grade in order to demonstrate improvement in strength and function.    Baseline 4-/4 Hip flexion  4-/4 Hip abduction  4-/4 Hip adduction  4-/4 Knee extension  4-/4 Knee flexion 4-/4 Ankle dorsiflexion  4-/4 Ankle plantarflexion    Time 6    Period Weeks    Status Partially Met    Target Date 08/13/21      PT LONG TERM GOAL #4   Title Pt will improve stair mobility with min bilat UE assist during reciprocal gait pattern 4 steps x 3 with occ verbal cueing.    Baseline pt requires heavy UE assist during reciprocal gait patterns and transfer stairs laterally during single rail assist.    Time 6    Period Weeks    Status Partially Met    Target Date 08/13/21                   Plan - 08/11/21 1256     Clinical  Impression Statement Marked muscle weakness/ fatigue in R hip with dynamic balance and gait in hallway.  Pt. requires use of SPC/ CGA during obstacle course/ cone taps/ maneuvering.  No LOB but extra time and UE assist for safety.  Decrease R LE step length/ pattern noted at end of tx. session secondary to muscle fatigue.  Pt. completes recip. stair climbing during tx. session.  Limited use of R hand/UE due to limited grasp noted during Nustep.  See updated PT goals.    Personal Factors and Comorbidities Age;Comorbidity 2;Fitness;Time since onset of injury/illness/exacerbation    Examination-Activity Limitations Transfers;Bend;Lift;Squat;Stairs;Locomotion Level;Carry;Stand;Dressing    Examination-Participation Restrictions Cleaning;Community Activity;Other    Stability/Clinical Decision Making Evolving/Moderate complexity    Clinical Decision Making Moderate    Rehab Potential Fair    PT Frequency 2x / week    PT Duration 6 weeks    PT Treatment/Interventions ADLs/Self Care Home Management;Aquatic Therapy;Cryotherapy;Moist Heat;Gait training;Stair training;Functional mobility training;Neuromuscular re-education;Balance training;Therapeutic exercise;Therapeutic activities;Patient/family education;Manual techniques;Passive range of motion;Electrical Stimulation;Canalith Repostioning;Ultrasound;Traction;DME Instruction;Dry needling;Energy conservation;Joint Manipulations;Vestibular;Spinal Manipulations    PT Next Visit Plan Dynamic balance tasks/ uneven terrain/ hip strengthening.   [place] and [id number]    Consulted and Agree with Plan of Care Patient             Patient will benefit from skilled therapeutic intervention in order to improve the following deficits and impairments:  Abnormal gait, Impaired sensation, Improper body mechanics, Pain, Postural dysfunction, Decreased mobility, Decreased activity tolerance, Decreased endurance, Decreased range of  motion, Decreased strength, Hypomobility, Impaired flexibility, Difficulty walking, Decreased balance, Dizziness  Visit Diagnosis: Gait difficulty  Muscle weakness (generalized)  At risk for falls  Right-sided muscle weakness     Problem List Patient Active Problem List   Diagnosis Date Noted   Spondylolisthesis of lumbar region 05/04/2018   Abnormal glucose level 02/12/2018   Abnormal weight gain 02/12/2018   Allergic rhinitis 02/12/2018   Asthma 02/12/2018   Bradycardia 02/12/2018   Edema 02/12/2018   Heart murmur 02/12/2018   Hypercalcemia 02/12/2018   Hypokalemia 02/12/2018   Pure hypercholesterolemia 02/12/2018  Shoulder joint pain 02/12/2018   Skin sensation disturbance 02/12/2018   Syncope 02/12/2018   Vitamin D deficiency 02/12/2018   Central cervical cord injury, 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 [place], [place], [address] Phone: [phone]   Fax:  [phone]  Name: Evelyn Cisneros MRN: [id number] Date of Birth: [birth year]

## 2021-08-13 ENCOUNTER — Ambulatory Visit: Payer: Medicare Other | Admitting: Physical Therapy

## 2021-08-13 ENCOUNTER — Other Ambulatory Visit: Payer: Self-pay

## 2021-08-13 DIAGNOSIS — Z9181 History of falling: Secondary | ICD-10-CM

## 2021-08-13 DIAGNOSIS — M6281 Muscle weakness (generalized): Secondary | ICD-10-CM

## 2021-08-13 DIAGNOSIS — R269 Unspecified abnormalities of gait and mobility: Secondary | ICD-10-CM

## 2021-08-16 ENCOUNTER — Ambulatory Visit: Payer: Medicare Other | Admitting: Physical Therapy

## 2021-08-16 ENCOUNTER — Other Ambulatory Visit: Payer: Self-pay

## 2021-08-16 DIAGNOSIS — M6281 Muscle weakness (generalized): Secondary | ICD-10-CM

## 2021-08-16 DIAGNOSIS — Z9181 History of falling: Secondary | ICD-10-CM

## 2021-08-16 DIAGNOSIS — R269 Unspecified abnormalities of gait and mobility: Secondary | ICD-10-CM | POA: Diagnosis not present

## 2021-08-20 ENCOUNTER — Other Ambulatory Visit: Payer: Self-pay

## 2021-08-20 ENCOUNTER — Encounter: Payer: Self-pay | Admitting: Physical Therapy

## 2021-08-20 ENCOUNTER — Ambulatory Visit: Payer: Medicare Other | Admitting: Physical Therapy

## 2021-08-20 DIAGNOSIS — R269 Unspecified abnormalities of gait and mobility: Secondary | ICD-10-CM

## 2021-08-20 DIAGNOSIS — Z9181 History of falling: Secondary | ICD-10-CM

## 2021-08-20 DIAGNOSIS — M6281 Muscle weakness (generalized): Secondary | ICD-10-CM

## 2021-08-20 NOTE — Therapy (Signed)
[facility] [address] [place], [place], [address] Phone: [phone]   Fax:  [phone]  Physical Therapy Treatment  Patient Details  Name: Evelyn Cisneros MRN: [id number] Date of Birth: 03-Jun-1941 Referring Provider (PT): Gypsy Decant MD   Encounter Date: 08/13/2021   PT End of Session - 08/20/21 1247     Visit Number 11    Number of Visits 19    Date for PT Re-Evaluation 09/24/21    Authorization - Visit Number 1    Authorization - Number of Visits 10    Progress Note Due on Visit 10    PT Start Time 1026    PT Stop Time 1115    PT Time Calculation (min) 49 min    Equipment Utilized During Treatment Gait belt    Activity Tolerance Patient tolerated treatment well    Behavior During Therapy WFL for tasks assessed/performed             Past Medical History:  Diagnosis Date   Anemia    Arthritis    Heart murmur    Hypertension    Hypothyroidism    Spondylolisthesis of lumbar region     Past Surgical History:  Procedure Laterality Date   ABDOMINAL HYSTERECTOMY     REPLACEMENT TOTAL KNEE BILATERAL     SPINE SURGERY      There were no vitals filed for this visit.   Subjective Assessment - 08/20/21 1245     Subjective Pt. states she did a lot of walking over the weekend at [employer]/ home.  No LOB or falls.  Pt. remains limited with R hand grasp/ use of finger flexors since Botox injection.  Pt. states the grasp will return with time.  Pt. entered PT with use of SPC today and husband assist to front door of clinic.    Limitations Lifting;Standing;Walking;House hold activities    How long can you sit comfortably? WNL    How long can you stand comfortably? 45 mins    How long can you walk comfortably? 15-20 mins    Diagnostic tests CT 04/30/21 rule out head/spine trauma post fall.    Patient Stated Goals Decrease risk of falls and improve mobility.    Currently in Pain? No/denies                [facility] PT  Assessment - 08/20/21 0001       Assessment   Medical Diagnosis Gait Instability    Referring Provider (PT) Gypsy Decant MD    Onset Date/Surgical Date 10/22/19      Prior Function   Level of Independence Independent             Treatment:      Therapeutic Exercise:   Nustep L3 for 10 min. with B UE/LE.  Difficulty maintaining R hand grasp.    Standing marching/ step touches (toe/heel touches) and hip abduction 20x each.  L UE on handrail.  (both days)  Seated LAQ/ heel and toe raises 20x.    Added 2# LE ex.        Neuro. Mm.:   Walking in hallway (8 laps)- with SPC.    Alt. UE/LE touches in //-bars with mirror feedback.  Tandem Airex:  lateral walking  6" green hurdle step overs with cuing to use less UE assist.  Pt. Requires HHA for safety and cuing to prevent R hip circumduction.     Ambulate in PT gym without SPC but requires SBA/CGA  for safety and cuing.  Increase hip flexion with mirror feedback to correct posture.   Berg: 41/56  FOTO: 50  Walking on grass at side of building (cuing to increase heel strike)   All treatment was completed with gait belt donned with CGA-min. A to ensure limited fall risk with activity.        PT Long Term Goals - 08/20/21 1323       PT LONG TERM GOAL #1   Title PT will improve FOTO score to predicted improvement value of 56 for improvement self-reported functional ability.    Baseline Initial eval: 52.  10/24: 50.    Time 6    Period Weeks    Status Not Met    Target Date 09/24/21      PT LONG TERM GOAL #2   Title Pt will imrpove BERG balance to by MCID of 5 points to reduce objective fall risk and safer comunnity mobilty.    Baseline Berg: 36/56.   10/24:  41/56 (high fall risk)- recommend assistive device    Time 6    Period Weeks    Status Partially Met    Target Date 09/24/21      PT LONG TERM GOAL #3   Title Pt will increase strength of by at least 1/2 MMT grade in order to demonstrate improvement in  strength and function.    Baseline 4-/4 Hip flexion  4-/4 Hip abduction  4-/4 Hip adduction  4-/4 Knee extension  4-/4 Knee flexion 4-/4 Ankle dorsiflexion  4-/4 Ankle plantarflexion    Time 6    Period Weeks    Status Partially Met    Target Date 09/24/21      PT LONG TERM GOAL #4   Title Pt will improve stair mobility with min bilat UE assist during reciprocal gait pattern 4 steps x 3 with occ verbal cueing.    Baseline pt requires heavy UE assist during reciprocal gait patterns and transfer stairs laterally during single rail assist.    Time 6    Period Weeks    Status Partially Met    Target Date 09/24/21                   Plan - 08/20/21 1248     Clinical Impression Statement R hip/LE muscle weakness remains as compared to L LE with MMT/ standing ther.ex.  CGA/min. A to complete all dynamic balance and gait activities in hallway/ outside.  Pt. continues to benefit from use of [facility] for safety with walking and use of handrail/ husband assist with ascending/descending stairs.  No improvement noted in FOTO score as compared to initial evaluation.  No LOB while walking on uneven/ grassy terrain but extra time and CGA for safety and fear of falling.  Pt. will continue to benefit from skilled PT services 1-2x/week to focus on LE strengthening and dynamic balance/ gait activites to decrease fall risk.    Personal Factors and Comorbidities Age;Comorbidity 2;Fitness;Time since onset of injury/illness/exacerbation    Examination-Activity Limitations Transfers;Bend;Lift;Squat;Stairs;Locomotion Level;Carry;Stand;Dressing    Examination-Participation Restrictions Cleaning;Community Activity;Other    Stability/Clinical Decision Making Evolving/Moderate complexity    Clinical Decision Making Moderate    Rehab Potential Fair    PT Frequency 2x / week    PT Duration 6 weeks    PT Treatment/Interventions ADLs/Self Care Home Management;Aquatic Therapy;Cryotherapy;Moist Heat;Gait training;Stair  training;Functional mobility training;Neuromuscular re-education;Balance training;Therapeutic exercise;Therapeutic activities;Patient/family education;Manual techniques;Passive range of motion;Electrical Stimulation;Canalith Repostioning;Ultrasound;Traction;DME Instruction;Dry needling;Energy conservation;Joint Manipulations;Vestibular;Spinal Manipulations  PT Next Visit Plan Continue progressing dynamic balance tasks/ uneven terrain/ hip strengthening.    PT Home Exercise Plan [id number] and [id number]    Consulted and Agree with Plan of Care Patient             Patient will benefit from skilled therapeutic intervention in order to improve the following deficits and impairments:  Abnormal gait, Impaired sensation, Improper body mechanics, Pain, Postural dysfunction, Decreased mobility, Decreased activity tolerance, Decreased endurance, Decreased range of motion, Decreased strength, Hypomobility, Impaired flexibility, Difficulty walking, Decreased balance, Dizziness  Visit Diagnosis: Gait difficulty  Muscle weakness (generalized)  At risk for falls  Right-sided muscle weakness     Problem List Patient Active Problem List   Diagnosis Date Noted   Spondylolisthesis of lumbar region 05/04/2018   Abnormal glucose level 02/12/2018   Abnormal weight gain 02/12/2018   Allergic rhinitis 02/12/2018   Asthma 02/12/2018   Bradycardia 02/12/2018   Edema 02/12/2018   Heart murmur 02/12/2018   Hypercalcemia 02/12/2018   Hypokalemia 02/12/2018   Pure hypercholesterolemia 02/12/2018   Shoulder joint pain 02/12/2018   Skin sensation disturbance 02/12/2018   Syncope 02/12/2018   Vitamin D deficiency 02/12/2018   Central cervical cord injury, 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, 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 ([place]) 04/24/2017   Muscle spasticity 04/24/2017   Weight loss 12/19/2016   Hypothyroidism 11/24/2016   Bilateral leg edema 11/24/2016   Cervical radiculopathy 11/24/2016   Disc degeneration, lumbar 11/24/2016    Essential hypertension 11/24/2016   Gallstones 11/24/2016   History of anxiety state 11/24/2016   Hyperlipidemia, unspecified 11/24/2016   Spinal stenosis of lumbar region with neurogenic claudication 11/08/2016   Anxiety 06/08/2015   Pura Spice, PT, DPT # [phone] 08/20/2021, 1:37 PM  [facility] [address] [place], [place], [address] Phone: [phone]   Fax:  [phone]  Name: Evelyn Cisneros MRN: [id number] Date of Birth: 1940/10/28

## 2021-08-20 NOTE — Therapy (Addendum)
[place] [facility] [address] [place], [place], [address] Phone: [phone]   Fax:  [phone]  Physical Therapy Treatment  Patient Details  Name: Evelyn Cisneros MRN: [id number] Date of Birth: 22-Sep-1941 Referring Provider (PT): Gypsy Decant MD   Encounter Date: 08/20/2021     PT End of Session - 08/20/21 1449     Visit Number 13    Number of Visits 19    Date for PT Re-Evaluation 09/24/21    Authorization - Visit Number 3    Authorization - Number of Visits 10    Progress Note Due on Visit 10    PT Start Time 0950    PT Stop Time 1036    PT Time Calculation (min) 46 min    Equipment Utilized During Treatment Gait belt    Activity Tolerance Patient tolerated treatment well    Behavior During Therapy WFL for tasks assessed/performed               Past Medical History:  Diagnosis Date   Anemia    Arthritis    Heart murmur    Hypertension    Hypothyroidism    Spondylolisthesis of lumbar region     Past Surgical History:  Procedure Laterality Date   ABDOMINAL HYSTERECTOMY     REPLACEMENT TOTAL KNEE BILATERAL     SPINE SURGERY      There were no vitals filed for this visit.   Subjective Assessment - 08/22/21 0847     Subjective No new complaints.  PT discussed decreasing tx. frequency to 1x/week with a greater focus on HEP/ daily walking ex.    Limitations Lifting;Standing;Walking;House hold activities    How long can you sit comfortably? WNL    How long can you stand comfortably? 45 mins    How long can you walk comfortably? 15-20 mins    Diagnostic tests CT 04/30/21 rule out head/spine trauma post fall.    Patient Stated Goals Decrease risk of falls and improve mobility.    Currently in Pain? No/denies             Treatment:    Therapeutic Exercise:   Nustep L3 for 10 min. with B UE/LE.  Difficulty maintaining R hand grasp/ occasional assist with finger positioning/ flexion.     Seated marching/  LAQ/ heel raises 20x each.  Standing 6" step touches at stairs with L UE assist (heel and toe touches).    Lateral walking in //-bars with increase hip flexion/ step length with light UE assist.      Discussed HEP/ daily walking with husband.       Neuro. Mm.:    Ambulate in PT gym without SPC but requires SBA/CGA for safety and cuing.  Increase hip flexion with mirror feedback to correct posture.   Stair climbing with step to/ recip./ lateral pattern assessing safety and R quad/LE mobility.  Good safety awareness   Tandem stance/ turning/ aspects of Berg   Walking on grass at side of building (cuing to increase heel strike and step length).       All treatment was completed with gait belt donned with CGA-min. A to ensure limited fall risk with activity.         PT Long Term Goals - 08/20/21 1323       PT LONG TERM GOAL #1   Title PT will improve FOTO score to predicted improvement value of 56 for improvement self-reported functional ability.    Baseline  Initial eval: 52.  10/24: 50.    Time 6    Period Weeks    Status Not Met    Target Date 09/24/21      PT LONG TERM GOAL #2   Title Pt will imrpove BERG balance to by MCID of 5 points to reduce objective fall risk and safer comunnity mobilty.    Baseline Berg: 36/56.   10/24:  41/56 (high fall risk)- recommend assistive device    Time 6    Period Weeks    Status Partially Met    Target Date 09/24/21      PT LONG TERM GOAL #3   Title Pt will increase strength of by at least 1/2 MMT grade in order to demonstrate improvement in strength and function.    Baseline 4-/4 Hip flexion  4-/4 Hip abduction  4-/4 Hip adduction  4-/4 Knee extension  4-/4 Knee flexion 4-/4 Ankle dorsiflexion  4-/4 Ankle plantarflexion    Time 6    Period Weeks    Status Partially Met    Target Date 09/24/21      PT LONG TERM GOAL #4   Title Pt will improve stair mobility with min bilat UE assist during reciprocal gait pattern 4 steps x 3 with occ  verbal cueing.    Baseline pt requires heavy UE assist during reciprocal gait patterns and transfer stairs laterally during single rail assist.    Time 6    Period Weeks    Status Partially Met    Target Date 09/24/21                Plan - 08/22/21 0849     Clinical Impression Statement Pt. works hard during tx. session and continues to present with limited R hip flexion/ recip. step pattern.  Pt. requires L UE assist with proper stair climbing and is able to demonstrate a recip. pattern with ascending/descending stairs.  Pt. demonstrates lateral stair climbing technique with good safety awareness.  No LOB during tx. session and several seated rest breaks taken between tasks. CGA/min. A with step touches without UE assit.  R hip circumduction with step overs secondary to R hip flexor weakness/ fatigue. No change to HEP and pt. instructed to continue with daily walking/ HEP.    Personal Factors and Comorbidities Age;Comorbidity 2;Fitness;Time since onset of injury/illness/exacerbation    Examination-Activity Limitations Transfers;Bend;Lift;Squat;Stairs;Locomotion Level;Carry;Stand;Dressing    Examination-Participation Restrictions Cleaning;Community Activity;Other    Stability/Clinical Decision Making Evolving/Moderate complexity    Clinical Decision Making Moderate    Rehab Potential Fair    PT Frequency 2x / week    PT Duration 6 weeks    PT Treatment/Interventions ADLs/Self Care Home Management;Aquatic Therapy;Cryotherapy;Moist Heat;Gait training;Stair training;Functional mobility training;Neuromuscular re-education;Balance training;Therapeutic exercise;Therapeutic activities;Patient/family education;Manual techniques;Passive range of motion;Electrical Stimulation;Canalith Repostioning;Ultrasound;Traction;DME Instruction;Dry needling;Energy conservation;Joint Manipulations;Vestibular;Spinal Manipulations    PT Next Visit Plan Continue progressing dynamic balance tasks/ uneven terrain/  hip strengthening.  Tx. scheduled changed to 1x/week for November.    PT Home Exercise Plan [id number] and [id number]    Consulted and Agree with Plan of Care Patient             Patient will benefit from skilled therapeutic intervention in order to improve the following deficits and impairments:  Abnormal gait, Impaired sensation, Improper body mechanics, Pain, Postural dysfunction, Decreased mobility, Decreased activity tolerance, Decreased endurance, Decreased range of motion, Decreased strength, Hypomobility, Impaired flexibility, Difficulty walking, Decreased balance, Dizziness  Visit Diagnosis: Gait difficulty  Muscle weakness (generalized)  At  risk for falls  Right-sided muscle weakness     Problem List Patient Active Problem List   Diagnosis Date Noted   Spondylolisthesis of lumbar region 05/04/2018   Abnormal glucose level 02/12/2018   Abnormal weight gain 02/12/2018   Allergic rhinitis 02/12/2018   Asthma 02/12/2018   Bradycardia 02/12/2018   Edema 02/12/2018   Heart murmur 02/12/2018   Hypercalcemia 02/12/2018   Hypokalemia 02/12/2018   Pure hypercholesterolemia 02/12/2018   Shoulder joint pain 02/12/2018   Skin sensation disturbance 02/12/2018   Syncope 02/12/2018   Vitamin D deficiency 02/12/2018   Central cervical cord injury, 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, 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 ([place]) 04/24/2017   Muscle spasticity 04/24/2017   Weight loss 12/19/2016   Hypothyroidism 11/24/2016   Bilateral leg edema 11/24/2016   Cervical radiculopathy 11/24/2016   Disc degeneration, lumbar 11/24/2016   Essential hypertension 11/24/2016   Gallstones 11/24/2016   History of anxiety state 11/24/2016   Hyperlipidemia, unspecified 11/24/2016   Spinal stenosis of lumbar region with neurogenic claudication 11/08/2016   Anxiety 06/08/2015   Pura Spice, PT, DPT # [id number] 08/22/2021, 9:55 AM  [place] [facility] [address] [place], [place],  [address] Phone: [phone]   Fax:  [phone]  Name: Davetta Olliff MRN: [id number] Date of Birth: [birth year]

## 2021-08-20 NOTE — Therapy (Addendum)
[place] [facility] [address] [place], [place], [address] Phone: [phone]   Fax:  [phone]  Physical Therapy Treatment  Patient Details  Name: Evelyn Cisneros MRN: [id number] Date of Birth: 11-06-1940 Referring Provider (PT): Gypsy Decant MD   Encounter Date: 08/16/2021   PT End of Session - 08/20/21 1445     Visit Number 12    Number of Visits 19    Date for PT Re-Evaluation 09/24/21    Authorization - Visit Number 2    Authorization - Number of Visits 10    Progress Note Due on Visit 10    PT Start Time 1024    PT Stop Time 1115    PT Time Calculation (min) 51 min    Equipment Utilized During Treatment Gait belt    Activity Tolerance Patient tolerated treatment well    Behavior During Therapy WFL for tasks assessed/performed             Past Medical History:  Diagnosis Date   Anemia    Arthritis    Heart murmur    Hypertension    Hypothyroidism    Spondylolisthesis of lumbar region     Past Surgical History:  Procedure Laterality Date   ABDOMINAL HYSTERECTOMY     REPLACEMENT TOTAL KNEE BILATERAL     SPINE SURGERY      There were no vitals filed for this visit.   Subjective Assessment - 08/20/21 1407     Subjective Pt. reports no new complaints.  Pt. entered PT with use of SPC in L UE with recip. gait pattern.    Limitations Lifting;Standing;Walking;House hold activities    How long can you sit comfortably? WNL    How long can you stand comfortably? 45 mins    How long can you walk comfortably? 15-20 mins    Diagnostic tests CT 04/30/21 rule out head/spine trauma post fall.    Patient Stated Goals Decrease risk of falls and improve mobility.    Currently in Pain? No/denies            Treatment:     Therapeutic Exercise:   Nustep L3 for 10 min. with B UE/LE.  Difficulty maintaining R hand grasp.     Seated/standing 2# LE ex.: marching/ step touches (toe/heel touches) and hip abduction 20x each  with use of L handrail.  LAQ/ heel raises/ marching (limited on R) 10x2.     Discussed HEP/ daily walking with husband.       Neuro. Mm.:   Alt. UE/LE touches in //-bars with mirror feedback.  Forward/lateral walking in //-bars with cuing for upright posture/ increase hip flexion and step length with light L UE assist 3x each direction.     6" green hurdle step overs with cuing to use less UE assist.  Pt. Requires HHA for safety and cuing to prevent R hip circumduction.     Ambulate in PT gym without SPC but requires SBA/CGA for safety and cuing.  Increase hip flexion with mirror feedback to correct posture.  Obstacle course in hallway/ walking on blue mat with uneven/ wts to challenge balance.     Walking on grass at side of building (cuing to increase heel strike)     All treatment was completed with gait belt donned with CGA-min. A to ensure limited fall risk with activity.         PT Long Term Goals - 08/20/21 1323       PT LONG  TERM GOAL #1   Title PT will improve FOTO score to predicted improvement value of 56 for improvement self-reported functional ability.    Baseline Initial eval: 52.  10/24: 50.    Time 6    Period Weeks    Status Not Met    Target Date 09/24/21      PT LONG TERM GOAL #2   Title Pt will imrpove BERG balance to by MCID of 5 points to reduce objective fall risk and safer comunnity mobilty.    Baseline Berg: 36/56.   10/24:  41/56 (high fall risk)- recommend assistive device    Time 6    Period Weeks    Status Partially Met    Target Date 09/24/21      PT LONG TERM GOAL #3   Title Pt will increase strength of by at least 1/2 MMT grade in order to demonstrate improvement in strength and function.    Baseline 4-/4 Hip flexion  4-/4 Hip abduction  4-/4 Hip adduction  4-/4 Knee extension  4-/4 Knee flexion 4-/4 Ankle dorsiflexion  4-/4 Ankle plantarflexion    Time 6    Period Weeks    Status Partially Met    Target Date 09/24/21      PT LONG  TERM GOAL #4   Title Pt will improve stair mobility with min bilat UE assist during reciprocal gait pattern 4 steps x 3 with occ verbal cueing.    Baseline pt requires heavy UE assist during reciprocal gait patterns and transfer stairs laterally during single rail assist.    Time 6    Period Weeks    Status Partially Met    Target Date 09/24/21              Tx. focused on LE strengthening and increasing R hip flexion/ step length with gait while using SPC. Moderate R LE muscle fatigue/weakness as compared to L LE with step touches/ recip. stair climbing.  No LOB but extra time and caution with higher level dynamic tasks and walking on uneven and grassy terrain.  Pt. will continue with current HEP and daily walking to improve endurance and functional mobility independence.      Patient will benefit from skilled therapeutic intervention in order to improve the following deficits and impairments:     Visit Diagnosis: Gait difficulty  Muscle weakness (generalized)  At risk for falls  Right-sided muscle weakness     Problem List Patient Active Problem List   Diagnosis Date Noted   Spondylolisthesis of lumbar region 05/04/2018   Abnormal glucose level 02/12/2018   Abnormal weight gain 02/12/2018   Allergic rhinitis 02/12/2018   Asthma 02/12/2018   Bradycardia 02/12/2018   Edema 02/12/2018   Heart murmur 02/12/2018   Hypercalcemia 02/12/2018   Hypokalemia 02/12/2018   Pure hypercholesterolemia 02/12/2018   Shoulder joint pain 02/12/2018   Skin sensation disturbance 02/12/2018   Syncope 02/12/2018   Vitamin D deficiency 02/12/2018   Central cervical cord injury, 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, 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 ([place]) 04/24/2017   Muscle spasticity 04/24/2017   Weight loss 12/19/2016   Hypothyroidism 11/24/2016   Bilateral leg edema 11/24/2016   Cervical radiculopathy 11/24/2016   Disc degeneration, lumbar 11/24/2016   Essential hypertension 11/24/2016   Gallstones 11/24/2016    History of anxiety state 11/24/2016   Hyperlipidemia, unspecified 11/24/2016   Spinal stenosis of lumbar region with neurogenic claudication 11/08/2016   Anxiety 06/08/2015   Pura Spice, PT, DPT # [phone] 08/20/2021, 2:47 PM  [facility] [address] [place], [place], [address] Phone: [phone]   Fax:  [phone]  Name: Deondria Puryear MRN: [id number] Date of Birth: 1941/04/26

## 2021-08-27 ENCOUNTER — Encounter: Payer: Self-pay | Admitting: Physical Therapy

## 2021-08-27 ENCOUNTER — Other Ambulatory Visit: Payer: Self-pay

## 2021-08-27 ENCOUNTER — Ambulatory Visit: Payer: Medicare Other | Attending: Neurology | Admitting: Physical Therapy

## 2021-08-27 DIAGNOSIS — M6281 Muscle weakness (generalized): Secondary | ICD-10-CM | POA: Diagnosis present

## 2021-08-27 DIAGNOSIS — R269 Unspecified abnormalities of gait and mobility: Secondary | ICD-10-CM | POA: Diagnosis not present

## 2021-08-27 DIAGNOSIS — Z9181 History of falling: Secondary | ICD-10-CM | POA: Diagnosis present

## 2021-08-27 NOTE — Therapy (Signed)
[place] [facility] [address] [place], [place], [address] Phone: [phone]   Fax:  [phone]  Physical Therapy Treatment  Patient Details  Name: Evelyn Cisneros MRN: [id number] Date of Birth: February 11, 1941 Referring Provider (PT): Gypsy Decant MD   Encounter Date: 08/27/2021   PT End of Session - 08/27/21 1459     Visit Number 14    Number of Visits 19    Date for PT Re-Evaluation 09/24/21    Authorization - Visit Number 4    Authorization - Number of Visits 10    Progress Note Due on Visit 10    PT Start Time 1024    PT Stop Time 1117    PT Time Calculation (min) 53 min    Equipment Utilized During Treatment Gait belt    Activity Tolerance Patient tolerated treatment well    Behavior During Therapy WFL for tasks assessed/performed             Past Medical History:  Diagnosis Date   Anemia    Arthritis    Heart murmur    Hypertension    Hypothyroidism    Spondylolisthesis of lumbar region     Past Surgical History:  Procedure Laterality Date   ABDOMINAL HYSTERECTOMY     REPLACEMENT TOTAL KNEE BILATERAL     SPINE SURGERY      There were no vitals filed for this visit.   Subjective Assessment - 08/27/21 1457     Subjective Pt. entered PT with no new issues/ complaints.  Pt. states she is using SPC and reports a LOB at [employer] but able to self-correct independently.    Limitations Lifting;Standing;Walking;House hold activities    How long can you sit comfortably? WNL    How long can you stand comfortably? 45 mins    How long can you walk comfortably? 15-20 mins    Diagnostic tests CT 04/30/21 rule out head/spine trauma post fall.    Patient Stated Goals Decrease risk of falls and improve mobility.    Currently in Pain? No/denies                Treatment:    Therapeutic Exercise:   Nustep L3 for 10 min. with B UE/LE.  Difficulty maintaining R hand grasp/ occasional assist with finger positioning/  flexion.     Seated and standing ex. (3#):  marching/ LAQ/ heel raises 20x each.  Standing  hip abduction/ extension 20x each.  Walking alt. UE/LE touches with 3# ankle wts. In //-bars.  6" step touches at stairs with L UE assist (heel and toe touches).     Lateral walking in //-bars with 3# ankle wt./ increase hip flexion/ step length with light UE assist 3x.          Neuro. Mm.:    Ambulate in PT gym without SPC but requires SBA/CGA for safety and cuing.  Increase hip flexion with mirror feedback to correct posture.   Stair climbing (4x) with step to/ recip.pattern and R quad/LE mobility.  Good safety awareness   Walking on grass at side of building (cuing to increase heel strike and step length).       All treatment was completed with gait belt donned with CGA-min. A to ensure limited fall risk with activity.        PT Long Term Goals - 08/20/21 1323       PT LONG TERM GOAL #1   Title PT will improve FOTO score to  predicted improvement value of 56 for improvement self-reported functional ability.    Baseline Initial eval: 52.  10/24: 50.    Time 6    Period Weeks    Status Not Met    Target Date 09/24/21      PT LONG TERM GOAL #2   Title Pt will imrpove BERG balance to by MCID of 5 points to reduce objective fall risk and safer comunnity mobilty.    Baseline Berg: 36/56.   10/24:  41/56 (high fall risk)- recommend assistive device    Time 6    Period Weeks    Status Partially Met    Target Date 09/24/21      PT LONG TERM GOAL #3   Title Pt will increase strength of by at least 1/2 MMT grade in order to demonstrate improvement in strength and function.    Baseline 4-/4 Hip flexion  4-/4 Hip abduction  4-/4 Hip adduction  4-/4 Knee extension  4-/4 Knee flexion 4-/4 Ankle dorsiflexion  4-/4 Ankle plantarflexion    Time 6    Period Weeks    Status Partially Met    Target Date 09/24/21      PT LONG TERM GOAL #4   Title Pt will improve stair mobility with min bilat UE  assist during reciprocal gait pattern 4 steps x 3 with occ verbal cueing.    Baseline pt requires heavy UE assist during reciprocal gait patterns and transfer stairs laterally during single rail assist.    Time 6    Period Weeks    Status Partially Met    Target Date 09/24/21                Plan - 08/27/21 1500     Clinical Impression Statement Pt. fearful of walking on uneven/ grassy outside terrain but demonstrates a consistent recip. step pattern/ heel strike.  No LOB on outside terrain but extra time/ focus required to ambulate with use of SPC.  Modertate R hip muscle weakness/ fatigue with step ups/ repeated hip flexion/ abd./ extension 20x each.  Pt. benefits from light UE assist with all standing/ dynamic balance tasks.  Pt. will continue to benefit from skilled PT services to focus on LE strengthening to improve balance/ independence and safety with gait.    Personal Factors and Comorbidities Age;Comorbidity 2;Fitness;Time since onset of injury/illness/exacerbation    Examination-Activity Limitations Transfers;Bend;Lift;Squat;Stairs;Locomotion Level;Carry;Stand;Dressing    Examination-Participation Restrictions Cleaning;Community Activity;Other    Stability/Clinical Decision Making Evolving/Moderate complexity    Clinical Decision Making Moderate    Rehab Potential Fair    PT Frequency 2x / week    PT Duration 6 weeks    PT Treatment/Interventions ADLs/Self Care Home Management;Aquatic Therapy;Cryotherapy;Moist Heat;Gait training;Stair training;Functional mobility training;Neuromuscular re-education;Balance training;Therapeutic exercise;Therapeutic activities;Patient/family education;Manual techniques;Passive range of motion;Electrical Stimulation;Canalith Repostioning;Ultrasound;Traction;DME Instruction;Dry needling;Energy conservation;Joint Manipulations;Vestibular;Spinal Manipulations    PT Next Visit Plan Continue progressing dynamic balance tasks/ uneven terrain/ hip  strengthening.    PT Home Exercise Plan [id number] and [id number]    Consulted and Agree with Plan of Care Patient             Patient will benefit from skilled therapeutic intervention in order to improve the following deficits and impairments:  Abnormal gait, Impaired sensation, Improper body mechanics, Pain, Postural dysfunction, Decreased mobility, Decreased activity tolerance, Decreased endurance, Decreased range of motion, Decreased strength, Hypomobility, Impaired flexibility, Difficulty walking, Decreased balance, Dizziness  Visit Diagnosis: Gait difficulty  Muscle weakness (generalized)  At risk for falls  Right-sided  muscle weakness     Problem List Patient Active Problem List   Diagnosis Date Noted   Spondylolisthesis of lumbar region 05/04/2018   Abnormal glucose level 02/12/2018   Abnormal weight gain 02/12/2018   Allergic rhinitis 02/12/2018   Asthma 02/12/2018   Bradycardia 02/12/2018   Edema 02/12/2018   Heart murmur 02/12/2018   Hypercalcemia 02/12/2018   Hypokalemia 02/12/2018   Pure hypercholesterolemia 02/12/2018   Shoulder joint pain 02/12/2018   Skin sensation disturbance 02/12/2018   Syncope 02/12/2018   Vitamin D deficiency 02/12/2018   Central cervical cord injury, 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 [place], [place], [address] Phone: [phone]   Fax:  [phone]  Name:  Evelyn Cisneros MRN: [id number] Date of Birth: 1941/03/06

## 2021-09-03 ENCOUNTER — Ambulatory Visit: Payer: Medicare Other | Admitting: Physical Therapy

## 2021-09-03 ENCOUNTER — Other Ambulatory Visit: Payer: Self-pay

## 2021-09-03 DIAGNOSIS — R269 Unspecified abnormalities of gait and mobility: Secondary | ICD-10-CM

## 2021-09-03 DIAGNOSIS — M6281 Muscle weakness (generalized): Secondary | ICD-10-CM

## 2021-09-03 DIAGNOSIS — Z9181 History of falling: Secondary | ICD-10-CM

## 2021-09-04 NOTE — Therapy (Signed)
[place] [facility] [address] [place], [place], [address] Phone: [phone]   Fax:  [phone]  Physical Therapy Treatment  Patient Details  Name: Evelyn Cisneros MRN: [id number] Date of Birth: 05/03/1941 Referring Provider (PT): Gypsy Decant MD   Encounter Date: 09/03/2021   PT End of Session - 09/04/21 1610     Visit Number 15    Number of Visits 19    Date for PT Re-Evaluation 09/24/21    Authorization - Visit Number 5    Authorization - Number of Visits 10    Progress Note Due on Visit 10    PT Start Time 1029    PT Stop Time 1116    PT Time Calculation (min) 47 min    Equipment Utilized During Treatment Gait belt    Activity Tolerance Patient tolerated treatment well    Behavior During Therapy WFL for tasks assessed/performed             Past Medical History:  Diagnosis Date   Anemia    Arthritis    Heart murmur    Hypertension    Hypothyroidism    Spondylolisthesis of lumbar region     Past Surgical History:  Procedure Laterality Date   ABDOMINAL HYSTERECTOMY     REPLACEMENT TOTAL KNEE BILATERAL     SPINE SURGERY      There were no vitals filed for this visit.   Subjective Assessment - 09/04/21 1603     Subjective Pt. reports a marked increase in back pain/ symptoms last week and over weekend.  Pt. states back pain is better today but still present.    Limitations Lifting;Standing;Walking;House hold activities    How long can you sit comfortably? WNL    How long can you stand comfortably? 45 mins    How long can you walk comfortably? 15-20 mins    Diagnostic tests CT 04/30/21 rule out head/spine trauma post fall.    Patient Stated Goals Decrease risk of falls and improve mobility.    Currently in Pain? Yes    Pain Score 3     Pain Location Back    Pain Orientation Lower;Mid    Pain Descriptors / Indicators Aching              Treatment:    Therapeutic Exercise:   Nustep L3 for 10 min. with  B UE/LE.  Improved R hand grasp and R elbow extension noted today/ occasional assist with finger positioning/ flexion.  Discussed c/o LBP over past weekend.  MH vs. Ice to low back.    Seated/ standing hip ex. (Focus on R LE motor/ eccentric control).        Neuro. Mm.:    Ambulate in PT gym with and without SPC but requires SBA/CGA for safety and cuing.  Increase hip flexion with mirror feedback to correct posture.  Marked improvement in cadence/ hip symmetry while using SPC in gym.     Stair climbing (4x) with step to/ recip.pattern and R quad/LE mobility.  Use of SPC on L and R handrail for control.  Pt. Prefers descending with lateral step to pattern.     Walking in parking lot/ sideway/ grassy terrain (cuing to increase heel strike and step length).  Stepping up on curb with R HHA/ L SPC and cuing for safety.  Pt. Hesitant/ fearful of falling.         All treatment was completed with gait belt donned with CGA-min. A to ensure  limited fall risk with activity.           PT Long Term Goals - 08/20/21 1323       PT LONG TERM GOAL #1   Title PT will improve FOTO score to predicted improvement value of 56 for improvement self-reported functional ability.    Baseline Initial eval: 52.  10/24: 50.    Time 6    Period Weeks    Status Not Met    Target Date 09/24/21      PT LONG TERM GOAL #2   Title Pt will imrpove BERG balance to by MCID of 5 points to reduce objective fall risk and safer comunnity mobilty.    Baseline Berg: 36/56.   10/24:  41/56 (high fall risk)- recommend assistive device    Time 6    Period Weeks    Status Partially Met    Target Date 09/24/21      PT LONG TERM GOAL #3   Title Pt will increase strength of by at least 1/2 MMT grade in order to demonstrate improvement in strength and function.    Baseline 4-/4 Hip flexion  4-/4 Hip abduction  4-/4 Hip adduction  4-/4 Knee extension  4-/4 Knee flexion 4-/4 Ankle dorsiflexion  4-/4 Ankle plantarflexion    Time  6    Period Weeks    Status Partially Met    Target Date 09/24/21      PT LONG TERM GOAL #4   Title Pt will improve stair mobility with min bilat UE assist during reciprocal gait pattern 4 steps x 3 with occ verbal cueing.    Baseline pt requires heavy UE assist during reciprocal gait patterns and transfer stairs laterally during single rail assist.    Time 6    Period Weeks    Status Partially Met    Target Date 09/24/21                Plan - 09/04/21 1611     Clinical Impression Statement Pt. able to safely ascend/descend 4 stairs with use of SPC and 1 handrail.  Limited R hand grasp on handrail/ Nustep UE.  Pt. able to ambulate with consistent recip. pattern in clinic/ agility ladder with use of SPC and SBA/CGA for safety/ cuing.  Forward head/ flexed posture during standing/ dynamic balance tasks.  Limited R hip flexion/ stepping over hurdles due to R hip flexor weakness.  Slight lateral lean/ compensatory movement patterns due to R hip/LE weakness.  Extra time/ cautious with ambulation on outside/ uneven terrain.  Pt. will continue with current HEP and consistent walking.  Recommend use of SPC with all ambulation to decrease back pain/ issues.    Personal Factors and Comorbidities Age;Comorbidity 2;Fitness;Time since onset of injury/illness/exacerbation    Examination-Activity Limitations Transfers;Bend;Lift;Squat;Stairs;Locomotion Level;Carry;Stand;Dressing    Examination-Participation Restrictions Cleaning;Community Activity;Other    Stability/Clinical Decision Making Evolving/Moderate complexity    Clinical Decision Making Moderate    Rehab Potential Fair    PT Frequency 2x / week    PT Duration 6 weeks    PT Treatment/Interventions ADLs/Self Care Home Management;Aquatic Therapy;Cryotherapy;Moist Heat;Gait training;Stair training;Functional mobility training;Neuromuscular re-education;Balance training;Therapeutic exercise;Therapeutic activities;Patient/family education;Manual  techniques;Passive range of motion;Electrical Stimulation;Canalith Repostioning;Ultrasound;Traction;DME Instruction;Dry needling;Energy conservation;Joint Manipulations;Vestibular;Spinal Manipulations    PT Next Visit Plan Continue progressing dynamic balance tasks/ uneven terrain/ hip strengthening.    PT Home Exercise Plan [id number] and [id number]    Consulted and Agree with Plan of Care Patient  Patient will benefit from skilled therapeutic intervention in order to improve the following deficits and impairments:  Abnormal gait, Impaired sensation, Improper body mechanics, Pain, Postural dysfunction, Decreased mobility, Decreased activity tolerance, Decreased endurance, Decreased range of motion, Decreased strength, Hypomobility, Impaired flexibility, Difficulty walking, Decreased balance, Dizziness  Visit Diagnosis: Gait difficulty  Muscle weakness (generalized)  At risk for falls  Right-sided muscle weakness     Problem List Patient Active Problem List   Diagnosis Date Noted   Spondylolisthesis of lumbar region 05/04/2018   Abnormal glucose level 02/12/2018   Abnormal weight gain 02/12/2018   Allergic rhinitis 02/12/2018   Asthma 02/12/2018   Bradycardia 02/12/2018   Edema 02/12/2018   Heart murmur 02/12/2018   Hypercalcemia 02/12/2018   Hypokalemia 02/12/2018   Pure hypercholesterolemia 02/12/2018   Shoulder joint pain 02/12/2018   Skin sensation disturbance 02/12/2018   Syncope 02/12/2018   Vitamin D deficiency 02/12/2018   Central cervical cord injury, 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 [place], [place], [address] Phone: [phone]   Fax:  [phone]  Name: Evelyn Cisneros MRN: [id number] Date of Birth: 09-24-1941

## 2021-09-17 ENCOUNTER — Ambulatory Visit: Payer: Medicare Other | Admitting: Physical Therapy

## 2021-09-17 ENCOUNTER — Other Ambulatory Visit: Payer: Self-pay

## 2021-09-17 ENCOUNTER — Encounter: Payer: Self-pay | Admitting: Physical Therapy

## 2021-09-17 DIAGNOSIS — R269 Unspecified abnormalities of gait and mobility: Secondary | ICD-10-CM | POA: Diagnosis not present

## 2021-09-17 DIAGNOSIS — Z9181 History of falling: Secondary | ICD-10-CM

## 2021-09-17 DIAGNOSIS — M6281 Muscle weakness (generalized): Secondary | ICD-10-CM

## 2021-09-17 NOTE — Therapy (Signed)
[place] [facility] [address] [place], [place], [address] Phone: [phone]   Fax:  [phone]  Physical Therapy Treatment/DIscharge  Patient Details  Name: Evelyn Cisneros MRN: [id number] Date of Birth: 07-22-1941 Referring Provider (PT): Gypsy Decant MD   Encounter Date: 09/17/2021   PT End of Session - 09/17/21 1248     Visit Number 16    Number of Visits 19    Date for PT Re-Evaluation 09/24/21    Authorization - Visit Number 6    Authorization - Number of Visits 10    Progress Note Due on Visit 10    PT Start Time 1027    PT Stop Time 1112    PT Time Calculation (min) 45 min    Equipment Utilized During Treatment Gait belt    Activity Tolerance Patient tolerated treatment well    Behavior During Therapy WFL for tasks assessed/performed             Past Medical History:  Diagnosis Date   Anemia    Arthritis    Heart murmur    Hypertension    Hypothyroidism    Spondylolisthesis of lumbar region     Past Surgical History:  Procedure Laterality Date   ABDOMINAL HYSTERECTOMY     REPLACEMENT TOTAL KNEE BILATERAL     SPINE SURGERY      There were no vitals filed for this visit.   Subjective Assessment - 09/17/21 1247     Subjective Pt. states she was busy over Thanksgiving weekend.  Pt. had a slight increase in back pain during preparing Thanksgiving meal but pain resolved over weekend.  Pt. is preparing for husbands upcoming ankle surgery.    Limitations Lifting;Standing;Walking;House hold activities    How long can you sit comfortably? WNL    How long can you stand comfortably? 45 mins    How long can you walk comfortably? 15-20 mins    Diagnostic tests CT 04/30/21 rule out head/spine trauma post fall.    Patient Stated Goals Decrease risk of falls and improve mobility.    Currently in Pain? Yes    Pain Score 3     Pain Location Back    Pain Orientation Lower;Mid                Treatment:     Therapeutic Exercise:   Nustep L3 for 10 min. with B UE/LE.  Improving R hand grasp and R elbow extension noted today/ occasional assist with finger positioning/ flexion.     Seated/ standing 4# hip ex. (Focus on R LE motor/ eccentric control).  Marching/ hip abduction/ toe and heel wt. Shifting 20x.  Seated LAQ/ marching 20x.  R LE muscle fatigue.   Walking lunges in //-bars x2.    R shoulder/ tricep/ bicep tenderness with light palpation.  Discussed STM with pt./ husband     Neuro. Mm.:    Ambulate in PT gym with SPC with increase hip flexion with mirror feedback to correct posture.  Marked improvement in cadence/ hip symmetry while using SPC in gym.     Stair climbing (4x) with step to/ recip.pattern and R quad/LE mobility.  Use of SPC on L and R handrail for control.  Pt. Prefers descending with lateral step to pattern.     Reaching forward/ cross body in //-bars at blue sticky notes.  SBA/CGA for safety.  Limited R shoulder flexion.    Walking cone taps in hallway/ at. UE and LE touches.  All treatment was completed with gait belt donned with SBA/CGA to ensure limited fall risk with activity.             PT Long Term Goals - 09/17/21 1254       PT LONG TERM GOAL #1   Title PT will improve FOTO score to predicted improvement value of 56 for improvement self-reported functional ability.    Baseline Initial eval: 52.  10/24: 50.  11/28: 56    Time 6    Period Weeks    Status Achieved    Target Date 09/17/21      PT LONG TERM GOAL #2   Title Pt will imrpove BERG balance to by MCID of 5 points to reduce objective fall risk and safer comunnity mobilty.    Baseline Berg: 36/56.   10/24:  41/56 (high fall risk)- recommend assistive device    Time 6    Period Weeks    Status Partially Met    Target Date 09/24/21      PT LONG TERM GOAL #3   Title Pt will increase strength of by at least 1/2 MMT grade in order to demonstrate improvement in strength and function.     Baseline 4-/4 Hip flexion  4-/4 Hip abduction  4-/4 Hip adduction  4-/4 Knee extension  4-/4 Knee flexion 4-/4 Ankle dorsiflexion  4-/4 Ankle plantarflexion    Time 6    Period Weeks    Status Partially Met    Target Date 09/24/21      PT LONG TERM GOAL #4   Title Pt will improve stair mobility with min bilat UE assist during reciprocal gait pattern 4 steps x 3 with occ verbal cueing.    Baseline pt requires heavy UE assist during reciprocal gait patterns and transfer stairs laterally during single rail assist.    Time 6    Period Weeks    Status Achieved    Target Date 09/17/21                   Plan - 09/17/21 1251     Clinical Impression Statement Pt. has shown marked progress towards goals and will be discharged from PT today with focus on HEP.  Pt. is preparing for husband's upcoming surgery and will continue with daily walking/ HEP.  Pt. has reported no recent falls and understands HEP.  Pt. instructed to contact PT if any regression in symptoms or questions.  See updated PT goals.  Discharge from PT at this time.    Personal Factors and Comorbidities Age;Comorbidity 2;Fitness;Time since onset of injury/illness/exacerbation    Examination-Activity Limitations Transfers;Bend;Lift;Squat;Stairs;Locomotion Level;Carry;Stand;Dressing    Examination-Participation Restrictions Cleaning;Community Activity;Other    Stability/Clinical Decision Making Evolving/Moderate complexity    Clinical Decision Making Moderate    Rehab Potential Fair    PT Frequency 2x / week    PT Duration 6 weeks    PT Treatment/Interventions ADLs/Self Care Home Management;Aquatic Therapy;Cryotherapy;Moist Heat;Gait training;Stair training;Functional mobility training;Neuromuscular re-education;Balance training;Therapeutic exercise;Therapeutic activities;Patient/family education;Manual techniques;Passive range of motion;Electrical Stimulation;Canalith Repostioning;Ultrasound;Traction;DME Instruction;Dry  needling;Energy conservation;Joint Manipulations;Vestibular;Spinal Manipulations    PT Next Visit Plan Discharge from PT at this time.    PT Home Exercise Plan [id number] and [id number]    Consulted and Agree with Plan of Care Patient             Patient will benefit from skilled therapeutic intervention in order to improve the following deficits and impairments:  Abnormal gait, Impaired sensation, Improper body mechanics, Pain, Postural dysfunction,  Decreased mobility, Decreased activity tolerance, Decreased endurance, Decreased range of motion, Decreased strength, Hypomobility, Impaired flexibility, Difficulty walking, Decreased balance, Dizziness  Visit Diagnosis: Gait difficulty  Muscle weakness (generalized)  At risk for falls  Right-sided muscle weakness     Problem List Patient Active Problem List   Diagnosis Date Noted   Spondylolisthesis of lumbar region 05/04/2018   Abnormal glucose level 02/12/2018   Abnormal weight gain 02/12/2018   Allergic rhinitis 02/12/2018   Asthma 02/12/2018   Bradycardia 02/12/2018   Edema 02/12/2018   Heart murmur 02/12/2018   Hypercalcemia 02/12/2018   Hypokalemia 02/12/2018   Pure hypercholesterolemia 02/12/2018   Shoulder joint pain 02/12/2018   Skin sensation disturbance 02/12/2018   Syncope 02/12/2018   Vitamin D deficiency 02/12/2018   Central cervical cord injury, 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, 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 ([place]) 04/24/2017   Muscle spasticity 04/24/2017   Weight loss 12/19/2016   Hypothyroidism 11/24/2016   Bilateral leg edema 11/24/2016   Cervical radiculopathy 11/24/2016   Disc degeneration, lumbar 11/24/2016   Essential hypertension 11/24/2016   Gallstones 11/24/2016   History of anxiety state 11/24/2016   Hyperlipidemia, unspecified 11/24/2016   Spinal stenosis of lumbar region with neurogenic claudication 11/08/2016   Anxiety 06/08/2015   Pura Spice, PT, DPT # [id number] 09/17/2021, 12:57 PM  [place] [facility] [address] [place], [place], [address] Phone: [phone]   Fax:  [phone]  Name: Laelani Vasko MRN: [id number] Date of Birth: [birth year]

## 2023-10-20 ENCOUNTER — Ambulatory Visit
Admission: EM | Admit: 2023-10-20 | Discharge: 2023-10-20 | Payer: Medicare Other | Attending: Emergency Medicine | Admitting: Emergency Medicine

## 2023-10-20 ENCOUNTER — Encounter: Payer: Self-pay | Admitting: Emergency Medicine

## 2023-10-20 DIAGNOSIS — S61411A Laceration without foreign body of right hand, initial encounter: Secondary | ICD-10-CM | POA: Diagnosis not present

## 2023-10-20 DIAGNOSIS — S4992XA Unspecified injury of left shoulder and upper arm, initial encounter: Secondary | ICD-10-CM | POA: Diagnosis not present

## 2023-10-20 DIAGNOSIS — W19XXXA Unspecified fall, initial encounter: Secondary | ICD-10-CM

## 2023-10-20 DIAGNOSIS — S0181XA Laceration without foreign body of other part of head, initial encounter: Secondary | ICD-10-CM | POA: Diagnosis not present

## 2023-10-20 NOTE — ED Notes (Signed)
Patient is being discharged from the Urgent Care and sent to the Emergency Department via private vehicle . Per Dr. Chaney Malling, patient is in need of higher level of care due to head lacerations/injury, left shoulder injury. Patient is aware and verbalizes understanding of plan of care.  Vitals:   10/20/23 1612 10/20/23 1617  BP: (!) 85/58 (!) 99/57  Pulse: (!) 48 (!) 57  Resp:    SpO2: 100%

## 2023-10-20 NOTE — Discharge Instructions (Addendum)
I am concerned that you have a dislocation versus fracture/dislocation of your left shoulder.  Unfortunately, we do not have the means to control your pain or ability to do conscious sedation here.  We have placed you in a sling and dressed your wounds.  You also have several deep facial lacerations and a skin tear on your right hand.  Please do not have anything to eat or drink until your ER evaluation is complete.  Let them know if your hand turns cold, blue, white, severe pain, headache, visual changes, nausea.

## 2023-10-20 NOTE — ED Provider Notes (Signed)
HPI  SUBJECTIVE:  Evelyn Cisneros is a 82 y.o. female who presents with left shoulder pain, multiple facial lacerations and a skin tear in her right hand after having a trip and fall at [facility] at 1545 today.  She states that she was pushing a cart, tripped on her feet, and hit her head on the cart.  She states that she also hit her face on the pavement.  She denies preceding chest pain, lightheadedness, dizziness, syncope causing her fall.  Denies loss of consciousness.  She is not sure if she hit her left shoulder, but thinks that she may have been picked up by her left shoulder.  Ports constant left shoulder pain and states that she has been unable to move her shoulder at all since the fall, with numbness and tingling in her left hand.  No color or temperature changes of her hands.  Patient denies headache, nausea, vomiting, neck pain, visual changes chest pain, shortness of breath.  No aggravating or alleviating factors.  She has not tried anything for this. Patient has a past medical history of hypertension, bradycardia, hypothyroidism, asthma, cholesterolemia, syncope, right hemiplegia status post neck fracture.  PCP: [facility].  Past Medical History:  Diagnosis Date   Anemia    Arthritis    Heart murmur    Hypertension    Hypothyroidism    Spondylolisthesis of lumbar region     Past Surgical History:  Procedure Laterality Date   ABDOMINAL HYSTERECTOMY     REPLACEMENT TOTAL KNEE BILATERAL     SPINE SURGERY      History reviewed. No pertinent family history.  Social History   Tobacco Use   Smoking status: Never   Smokeless tobacco: Never  Vaping Use   Vaping status: Never Used  Substance Use Topics   Alcohol use: No   Drug use: No    No current facility-administered medications for this encounter.  Current Outpatient Medications:    acetaminophen (TYLENOL) 650 MG CR tablet, Take 650-1,300 mg by mouth every 8 (eight) hours as needed for pain., Disp: , Rfl:     aspirin EC 81 MG tablet, Take 81 mg by mouth daily., Disp: , Rfl:    atorvastatin (LIPITOR) 20 MG tablet, Take 20 mg by mouth every evening. , Disp: , Rfl:    Cholecalciferol (VITAMIN D-3) 5000 units TABS, Take 5,000 Units by mouth daily. , Disp: , Rfl:    Coenzyme Q10 (COQ10) 200 MG CAPS, Take 200 mg by mouth daily. , Disp: , Rfl:    docusate sodium (COLACE) 100 MG capsule, Take 1 capsule (100 mg total) by mouth 2 (two) times daily., Disp: 60 capsule, Rfl: 0   ferrous sulfate 325 (65 FE) MG tablet, Take 325 mg by mouth every 3 (three) days., Disp: , Rfl:    furosemide (LASIX) 20 MG tablet, Take 20 mg by mouth daily. , Disp: , Rfl:    gabapentin (NEURONTIN) 300 MG capsule, Take 300 mg by mouth 3 (three) times daily., Disp: , Rfl:    levothyroxine (SYNTHROID, LEVOTHROID) 75 MCG tablet, Take 75 mcg by mouth daily before breakfast., Disp: , Rfl:    losartan (COZAAR) 100 MG tablet, Take 100 mg by mouth every evening. , Disp: , Rfl:    Multiple Vitamins-Minerals (PRESERVISION/LUTEIN) CAPS, Take 1 capsule by mouth daily. , Disp: , Rfl:    oxyCODONE (OXY IR/ROXICODONE) 5 MG immediate release tablet, Take 1 tablet (5 mg total) by mouth every 4 (four) hours as needed for moderate  pain ((score 4 to 6))., Disp: 30 tablet, Rfl: 0   PARoxetine (PAXIL) 10 MG tablet, Take 10 mg by mouth every evening. , Disp: , Rfl:    Tetrahydrozoline HCl (VISINE OP), Place 2 drops into both eyes every evening., Disp: , Rfl:    traMADol (ULTRAM) 50 MG tablet, Take 50 mg by mouth every evening. , Disp: , Rfl:   Allergies  Allergen Reactions   Pineapple Hives and Nausea And Vomiting   Pregabalin Other (See Comments)    Pt fell and broke neck, does not want medication   Motrin [Ibuprofen] Swelling   Naproxen Swelling   Tizanidine Other (See Comments)    Dizziness      ROS  As noted in HPI.   Physical Exam  BP (!) 85/58 (BP Location: Left Arm)   Pulse (!) 48   Resp 20   SpO2 100%  BP Readings from Last 3  Encounters:  10/20/23 (!) 85/58  05/05/18 (!) 110/57  04/27/18 140/69    Constitutional: Well developed, well nourished, no acute distress Eyes:  EOMI, conjunctiva normal bilaterally PERRLA HENT: Normocephalic, 5 cm deep laceration forehead, 2 cm deep laceration superior to the right eye.  No periorbital ecchymosis.  No other facial tenderness.     Neck: No C-spine tenderness.  Positive upper T-spine tenderness. Respiratory: Normal inspiratory effort Cardiovascular: Regular bradycardia, no murmurs rubs or gallops GI: nondistended skin: No rash, 2 skin tears over right hand  Musculoskeletal: Deformity left shoulder.  Patient unable to move left shoulder at all.  No tenderness over the clavicle.  Positive proximal humerus tenderness.  Sensation intact over the deltoid.  No other arm, elbow tenderness.  RP 2+.  Sensation motor intact in the median/radial/ulnar distribution Neurologic: Alert & oriented x 3, no focal neuro deficits.  GCS 15. Psychiatric: Speech and behavior appropriate   ED Course   Medications - No data to display  Orders Placed This Encounter  Procedures   Apply shoulder immobilizer/sling    Standing Status:   Standing    Number of Occurrences:   1    Reason for Shoulder immobilizer/Sling & Swathe:   Pain in arm    Pain in arm location:   Pain in L arm (M79.602)    No results found for this or any previous visit (from the past 24 hours). No results found.  ED Clinical Impression  1. Injury of left shoulder, initial encounter   2. Laceration of forehead, initial encounter   3. Facial laceration, initial encounter   4. Skin tear of right hand without complication, initial encounter   5. Fall, initial encounter      ED Assessment/Plan    Patient was initially hypotensive at 85/58, but blood pressure improved to 99/57.  She is awake, alert, and has no chest pain or shortness of breath.  Patient presents status post mechanical fall with multiple head  lacerations, skin tears on her right hand and a deformed left shoulder that she is unable to move at all.  I am concerned that she has a shoulder dislocation versus dislocation/fracture.  She is neurovascularly intact here.  Placing in a sling.  Unfortunately, we do not conscious sedation here available for reduction.  Also concerned that she may need a head CT given her age.  Patient would like to go to the [facility] emergency department.  She is stable to go via private vehicle.  Her son will drive her.  Wounds were dressed and patient was placed in the  sling.  Discussed rationale for transfer to the emergency department with patient and family member.  They agree to go  No orders of the defined types were placed in this encounter.     *This clinic note was created using Dragon dictation software. Therefore, there may be occasional mistakes despite careful proofreading.  ?    Domenick Gong, MD 10/20/23 1651

## 2023-11-05 ENCOUNTER — Ambulatory Visit: Payer: Medicare Other | Attending: Physician Assistant | Admitting: Physical Therapy

## 2023-11-05 DIAGNOSIS — M25612 Stiffness of left shoulder, not elsewhere classified: Secondary | ICD-10-CM | POA: Diagnosis present

## 2023-11-05 DIAGNOSIS — M25512 Pain in left shoulder: Secondary | ICD-10-CM | POA: Diagnosis present

## 2023-11-05 DIAGNOSIS — M6281 Muscle weakness (generalized): Secondary | ICD-10-CM | POA: Insufficient documentation

## 2023-11-06 ENCOUNTER — Ambulatory Visit: Payer: Medicare Other | Admitting: Physical Therapy

## 2023-11-06 DIAGNOSIS — M25512 Pain in left shoulder: Secondary | ICD-10-CM

## 2023-11-06 DIAGNOSIS — M25612 Stiffness of left shoulder, not elsewhere classified: Secondary | ICD-10-CM

## 2023-11-06 DIAGNOSIS — M6281 Muscle weakness (generalized): Secondary | ICD-10-CM

## 2023-11-11 ENCOUNTER — Encounter: Payer: Self-pay | Admitting: Physical Therapy

## 2023-11-11 ENCOUNTER — Ambulatory Visit: Payer: Medicare Other | Admitting: Physical Therapy

## 2023-11-11 DIAGNOSIS — M25512 Pain in left shoulder: Secondary | ICD-10-CM

## 2023-11-11 DIAGNOSIS — M6281 Muscle weakness (generalized): Secondary | ICD-10-CM

## 2023-11-11 DIAGNOSIS — M25612 Stiffness of left shoulder, not elsewhere classified: Secondary | ICD-10-CM

## 2023-11-11 NOTE — Therapy (Signed)
OUTPATIENT PHYSICAL THERAPY SHOULDER EVALUATION  Patient Name: Evelyn Cisneros MRN: [id number] DOB:1941/09/15, 83 y.o., female Today's Date: 11/05/2023  END OF SESSION:  PT End of Session - 11/11/23 0738     Visit Number 1    Number of Visits 12    Date for PT Re-Evaluation 12/17/23    PT Start Time 0934    PT Stop Time 1028    PT Time Calculation (min) 54 min    Activity Tolerance Patient limited by pain             Past Medical History:  Diagnosis Date   Anemia    Arthritis    Heart murmur    Hypertension    Hypothyroidism    Spondylolisthesis of lumbar region    Past Surgical History:  Procedure Laterality Date   ABDOMINAL HYSTERECTOMY     REPLACEMENT TOTAL KNEE BILATERAL     SPINE SURGERY     Patient Active Problem List   Diagnosis Date Noted   Spondylolisthesis of lumbar region 05/04/2018   Abnormal glucose level 02/12/2018   Abnormal weight gain 02/12/2018   Allergic rhinitis 02/12/2018   Asthma 02/12/2018   Bradycardia 02/12/2018   Edema 02/12/2018   Heart murmur 02/12/2018   Hypercalcemia 02/12/2018   Hypokalemia 02/12/2018   Pure hypercholesterolemia 02/12/2018   Shoulder joint pain 02/12/2018   Skin sensation disturbance 02/12/2018   Syncope 02/12/2018   Vitamin D deficiency 02/12/2018   Central cervical cord injury, 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, 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) 04/24/2017   Muscle spasticity 04/24/2017   Weight loss 12/19/2016   Hypothyroidism 11/24/2016   Bilateral leg edema 11/24/2016   Cervical radiculopathy 11/24/2016   Disc degeneration, lumbar 11/24/2016   Essential hypertension 11/24/2016   Gallstones 11/24/2016   History of anxiety state 11/24/2016   Hyperlipidemia, unspecified 11/24/2016   Spinal stenosis of lumbar region with neurogenic claudication 11/08/2016   Anxiety 06/08/2015   PCP: Verner Mould, MD  REFERRING PROVIDER: Michell Heinrich, PA-C  REFERRING DIAG: [phone] (ICD-10-CM) - Anterior dislocation of left humerus    THERAPY DIAG:  Acute pain of left shoulder  Post-traumatic stiffness of left shoulder joint  Muscle weakness (generalized)  Rationale for Evaluation and Treatment: Rehabilitation  ONSET DATE: 10/20/23  SUBJECTIVE:                                                                                                                                                                                      SUBJECTIVE STATEMENT: Pt. Reports falling while pushing shopping cart in parking lot at [employer].  Pt. Hit head resulting in lacerations/ stitches and L anterior shoulder dislocation.  Pt. Reports 0/10 L  shoulder pain at rest and 2/10 pain with eating/brushing teeth.  Pt. Was using SPC on L prior to fall but limited due to shoulder sling.  Pt. Entered PT in w/c today due to inability to use L UE at this time.  Hand dominance: Left  PERTINENT HISTORY: Subjective   Evelyn Cisneros is a 83 y.o. female in today for: HPI: History of Present Illness  Patient here today for suture removal. She underwent lac repair of her forehead after a fall on concrete 12/30. Repaired 2 lacs with 2 interrupted sutures and 6 running locked sutures.   Today, patient reports healing well. She has had a nodule come up on her left wrist. It is mildly tender to palpation, does not bother her otherwise.   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 90 tablet 3  atorvastatin (LIPITOR) 40 MG tablet TAKE 1 TABLET(40 MG) BY MOUTH EVERY DAY 90 tablet 3  biotin 5 mg Tab Take by mouth  busPIRone (BUSPAR) 5 MG tablet Take 1 tablet (5 mg total) by mouth 2 (two) times daily as needed 30 tablet 5  calcium lactate 100 mg calcium Tab Take by mouth  cholecalciferol, vitamin D3, (VITAMIN D3) 125 mcg (5,000 unit) Tab Take 1 tablet by mouth every morning 90 tablet 3  co-enzyme Q-10, ubiquinone, 200 mg capsule Take 200 mg by mouth every morning.   cyanocobalamin (VITAMIN B12) 1,000 mcg SL tablet Take by mouth  ferrous sulfate 325 (65 FE) MG tablet Take 1 tablet (325 mg total) by mouth twice a week 90 tablet 3  FUROsemide (LASIX) 20 MG tablet Take 1-2 tablets (20-40 mg total) by mouth once daily as needed for Edema 60 tablet 4  gabapentin (NEURONTIN) 300 MG capsule TAKE ONE CAPSULE BY MOUTH THREE TIMES DAILY TO FOUR TIMES DAILY AS NEEDED FOR PAIN 360 capsule 3  Lactobacillus acidophilus (PROBIOTIC ORAL) Take by mouth once daily  levothyroxine (SYNTHROID) 50 MCG tablet Take 1.5 tablets (75 mcg total) by mouth once daily 135 tablet 3  lidocaine (LIDODERM) 5 % patch as needed  losartan (COZAAR) 100 MG tablet TAKE 1 TABLET(100 MG) BY MOUTH EVERY DAY 90 tablet 3  MAGNESIUM ORAL Take by mouth  VIT C/VIT E AC/LUT/COPPER/ZINC (PRESERVISION LUTEIN ORAL) Take 1 tablet by mouth every morning. Last dose one week prior to procedure  ZINC ORAL Take 50 mg by mouth once daily   No facility-administered medications prior to visit.    Objective   Vitals:  10/30/23 1350  PainSc: 0-No pain   There is no height or weight on file to calculate BMI. Home Vitals:  Physical Exam Constitutional:  General: She is not in acute distress. Appearance: Normal appearance.  HENT:  Head: Normocephalic and atraumatic.  Eyes:  General: No scleral icterus. Extraocular Movements: Extraocular movements  intact.  Conjunctiva/sclera: Conjunctivae normal.  Cardiovascular:  Rate and Rhythm: Normal rate and regular  rhythm.  Pulmonary:  Effort: Pulmonary effort is normal. No respiratory distress.  Breath sounds: Normal breath sounds.  Musculoskeletal:  General: Normal range of motion.  Cervical back: Normal range of motion and neck supple.  Comments: Head lacerations with good interval healing. All sutures visualized for removal.   Left hand with significant bruising from PIPs to upper wrist, interval resolution present. There is a cystic lesion, nonmobile, mildly tender to manipulation present in her posterior central wrist.  Skin: General: Skin is warm and dry.  Coloration: Skin is not jaundiced.  Findings: No erythema or rash.  Neurological:  General: No focal deficit present.  Mental Status: She is alert.  Cranial Nerves: No cranial nerve deficit.  Coordination: Coordination normal.   Assessment & Plan  Diagnoses and all orders for this visit:  Visit for suture removal - Running central scalp suture and 2x interrupted suture of right scalp removed without difficulty. Area clean with good interval healing to wound.  - Recommend gentle washing with soap and water. Avoid aggressive scrubbing. Keep area clean and dry. Reviewed warning s/s for wound healing or new onset infection requiring further evaluation.  Wrist lesion - Possible ganglion cyst vs healing blister pocket, difficult to evaluate fully with overall bruising and injury from her recent fall. Reviewed possible diagnoses. If pain, worsening swelling, numbness/tingling or movement limitations recommend earlier evaluation. Otherwise recommend evaluation by orthopedics at her follow up if symptoms do not resolve with the rest of her injuries.   PAIN:  Are you having pain? Yes: NPRS scale: 2/10 Pain location: L shoulder Pain description: sharp/aching Aggravating factors: movement/ brushing teeth Relieving factors: rest/ use  of sling  PRECAUTIONS: Shoulder  RED FLAGS: None   WEIGHT BEARING RESTRICTIONS: No  FALLS:  Has patient fallen in last 6 months? Yes. Number of falls 2  LIVING ENVIRONMENT: Lives with: lives with their spouse Lives in: House/apartment Has following equipment at home: Single point cane and Wheelchair (manual)  OCCUPATION: Retired  PLOF: Independent with household mobility with device  PATIENT GOALS:  Increase L shoulder AROM/ strength/ pain-free mobility.    NEXT MD VISIT: 11/14/23 with Dr. Franco Collet (ortho MD)  OBJECTIVE:  Note: Objective measures were completed at Evaluation unless otherwise noted.  DIAGNOSTIC FINDINGS:  See imaging  PATIENT SURVEYS:  FOTO initial 32/ goal 48   COGNITION: Overall cognitive status: Within functional limits for tasks assessed     SENSATION: WFL.  Moderate L hand bruising.    POSTURE: Rounded shoulders/ forward posture.  Use of L shoulder sling.    UPPER EXTREMITY ROM:  Full tear of R supraspinatus reported.    Passive ROM Right Eval AROM Left Eval PROM  Shoulder flexion 74 deg. 53 deg.  Shoulder extension    Shoulder abduction 70 deg. 42 deg.  Shoulder adduction    Shoulder internal rotation  [facility]  Shoulder external rotation  0 deg.  Elbow flexion [facility] WFL  Elbow extension [facility] WFL  Wrist flexion    Wrist extension    Wrist ulnar deviation    Wrist radial deviation    Wrist pronation    Wrist supination    (Blank rows = not tested)  UPPER EXTREMITY MMT:  No MMT testing today.    SHOULDER SPECIAL TESTS: NT  JOINT MOBILITY TESTING:  No L shoulder joint mobs. Secondary to anterior dislocation.    PALPATION:  Generalized L hand tenderness/ bruising.  TREATMENT DATE: 11/05/23  See evaluation/ Supine elevated head on mat table L shoulder PROM (all planes)- as tolerated.  Limited ROM with focus  on flexion/ abduction.  Reviewed L elbow/ wrist AROM.     PATIENT EDUCATION: Education details: Reviewed HEP Person educated: Patient and Spouse Education method: Medical illustrator Education comprehension: verbalized understanding and returned demonstration  HOME EXERCISE PROGRAM: Will issue new HEP next tx.   ASSESSMENT:  CLINICAL IMPRESSION: Patient is a pleasant 83 y.o. female who was seen today for physical therapy evaluation and treatment for L shoulder pain/limitations after recent anterior sh. Dislocation from fall/ relocation at ER.  Pt. Reports minimal L shoulder pain and presents with moderate L shoulder joint stiffness/limitations.  Pt. Using a w/c currently with household/community mobility secondary to inability to use L shoulder/SPC.  Pt. Will benefit from skilled PT services to increase L shoulder ROM/ strength to promote return to PLOF/ mobility with SPC.       OBJECTIVE IMPAIRMENTS: decreased activity tolerance, decreased endurance, decreased ROM, decreased strength, hypomobility, impaired flexibility, impaired sensation, improper body mechanics, postural dysfunction, and pain.   ACTIVITY LIMITATIONS: carrying, lifting, bathing, toileting, dressing, self feeding, reach over head, and hygiene/grooming  PARTICIPATION LIMITATIONS: meal prep, cleaning, laundry, and shopping  PERSONAL FACTORS: Fitness and Past/current experiences are also affecting patient's functional outcome.   REHAB POTENTIAL: Good  CLINICAL DECISION MAKING: Stable/uncomplicated  EVALUATION COMPLEXITY: Moderate   GOALS: Goals reviewed with patient? Yes  SHORT TERM GOALS: Target date: 11/26/23  Pt. Independent with HEP to increase L shoulder PROM to 90 deg. Flexion/ abduction in seated posture to improve L sh. Joint capsule.   Baseline:  See above Goal status: INITIAL  2.  Pt. Able to complete HEP with no increase c/o L shoulder pain to improve L sh. Joint mobility/ stability.    Baseline:  Goal status: INITIAL  LONG TERM GOALS: Target date: 12/17/23  Pt. Will increase FOTO to 61 to improve pain-free mobility.   Baseline: initial 32 Goal status: INITIAL  2.  Pt. Will increase L shoulder AROM to >90 deg. Flexion to improve management of hair/ feeding.   Baseline: no L shoulder AROM at this time.   Goal status: INITIAL  3.  Pt. Able to ambulate with use of SPC on L with consistent recip. Gait pattern and on c/o L shoulder pain to improve household mobility.   Baseline: Pt. Unable to use L shoulder/ SPC at this time.  Goal status: INITIAL   PLAN:  PT FREQUENCY: 2x/week  PT DURATION: 6 weeks  PLANNED INTERVENTIONS: 97110-Therapeutic exercises, 97530- Therapeutic activity, [id number]- Neuromuscular re-education, 97535- Self Care, [id number]- Manual therapy, Patient/Family education, Joint mobilization, DME instructions, Cryotherapy, and Moist heat  PLAN FOR NEXT SESSION: Issue HEP/ isometrics   Cammie Mcgee, PT, DPT # [phone] 11/11/2023, 7:44 AM

## 2023-11-11 NOTE — Therapy (Signed)
OUTPATIENT PHYSICAL THERAPY SHOULDER TREATMENT  Patient Name: Evelyn Cisneros MRN: [id number] DOB:25-Feb-1941, 83 y.o., female Today's Date: 11/06/2023  END OF SESSION:  PT End of Session - 11/11/23 1428     Visit Number 2    Number of Visits 12    Date for PT Re-Evaluation 12/17/23    PT Start Time 1555    PT Stop Time 1644    PT Time Calculation (min) 49 min    Activity Tolerance Patient limited by pain             Past Medical History:  Diagnosis Date   Anemia    Arthritis    Heart murmur    Hypertension    Hypothyroidism    Spondylolisthesis of lumbar region    Past Surgical History:  Procedure Laterality Date   ABDOMINAL HYSTERECTOMY     REPLACEMENT TOTAL KNEE BILATERAL     SPINE SURGERY     Patient Active Problem List   Diagnosis Date Noted   Spondylolisthesis of lumbar region 05/04/2018   Abnormal glucose level 02/12/2018   Abnormal weight gain 02/12/2018   Allergic rhinitis 02/12/2018   Asthma 02/12/2018   Bradycardia 02/12/2018   Edema 02/12/2018   Heart murmur 02/12/2018   Hypercalcemia 02/12/2018   Hypokalemia 02/12/2018   Pure hypercholesterolemia 02/12/2018   Shoulder joint pain 02/12/2018   Skin sensation disturbance 02/12/2018   Syncope 02/12/2018   Vitamin D deficiency 02/12/2018   Central cervical cord injury, 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, 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) 04/24/2017   Muscle spasticity 04/24/2017   Weight loss 12/19/2016   Hypothyroidism 11/24/2016   Bilateral leg edema 11/24/2016   Cervical radiculopathy 11/24/2016   Disc degeneration, lumbar 11/24/2016   Essential hypertension 11/24/2016   Gallstones 11/24/2016   History of anxiety state 11/24/2016   Hyperlipidemia, unspecified 11/24/2016   Spinal stenosis of lumbar region with neurogenic claudication 11/08/2016   Anxiety 06/08/2015   PCP: Verner Mould, MD  REFERRING PROVIDER: Michell Heinrich, PA-C  REFERRING DIAG: [phone] (ICD-10-CM) - Anterior dislocation of left humerus    THERAPY DIAG:  Acute pain of left shoulder  Post-traumatic stiffness of left shoulder joint  Muscle weakness (generalized)  Rationale for Evaluation and Treatment: Rehabilitation  ONSET DATE: 10/20/23  SUBJECTIVE:                                                                                                                                                                                      SUBJECTIVE STATEMENT: Pt. Reports falling while pushing shopping cart in parking lot at [place].  Pt. Hit head resulting in lacerations/ stitches and L anterior shoulder dislocation.  Pt. Reports 0/10 L  shoulder pain at rest and 2/10 pain with eating/brushing teeth.  Pt. Was using SPC on L prior to fall but limited due to shoulder sling.  Pt. Entered PT in w/c today due to inability to use L UE at this time.  Hand dominance: Left  PERTINENT HISTORY: Subjective   Tovia Dora is a 83 y.o. female in today for: HPI: History of Present Illness  Patient here today for suture removal. She underwent lac repair of her forehead after a fall on concrete 12/30. Repaired 2 lacs with 2 interrupted sutures and 6 running locked sutures.   Today, patient reports healing well. She has had a nodule come up on her left wrist. It is mildly tender to palpation, does not bother her otherwise.   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 90 tablet 3  atorvastatin (LIPITOR) 40 MG tablet TAKE 1 TABLET(40 MG) BY MOUTH EVERY DAY 90 tablet 3  biotin 5 mg Tab Take by mouth  busPIRone (BUSPAR) 5 MG tablet Take 1 tablet (5 mg total) by mouth 2 (two) times daily as needed 30 tablet 5  calcium lactate 100 mg calcium Tab Take by mouth  cholecalciferol, vitamin D3, (VITAMIN D3) 125 mcg (5,000 unit) Tab Take 1 tablet by mouth every morning 90 tablet 3  co-enzyme Q-10, ubiquinone, 200 mg capsule Take 200 mg by mouth every morning.   cyanocobalamin (VITAMIN B12) 1,000 mcg SL tablet Take by mouth  ferrous sulfate 325 (65 FE) MG tablet Take 1 tablet (325 mg total) by mouth twice a week 90 tablet 3  FUROsemide (LASIX) 20 MG tablet Take 1-2 tablets (20-40 mg total) by mouth once daily as needed for Edema 60 tablet 4  gabapentin (NEURONTIN) 300 MG capsule TAKE ONE CAPSULE BY MOUTH THREE TIMES DAILY TO FOUR TIMES DAILY AS NEEDED FOR PAIN 360 capsule 3  Lactobacillus acidophilus (PROBIOTIC ORAL) Take by mouth once daily  levothyroxine (SYNTHROID) 50 MCG tablet Take 1.5 tablets (75 mcg total) by mouth once daily 135 tablet 3  lidocaine (LIDODERM) 5 % patch as needed  losartan (COZAAR) 100 MG tablet TAKE 1 TABLET(100 MG) BY MOUTH EVERY DAY 90 tablet 3  MAGNESIUM ORAL Take by mouth  VIT C/VIT E AC/LUT/COPPER/ZINC (PRESERVISION LUTEIN ORAL) Take 1 tablet by mouth every morning. Last dose one week prior to procedure  ZINC ORAL Take 50 mg by mouth once daily   No facility-administered medications prior to visit.    Objective   Vitals:  10/30/23 1350  PainSc: 0-No pain   There is no height or weight on file to calculate BMI. Home Vitals:  Physical Exam Constitutional:  General: She is not in acute distress. Appearance: Normal appearance.  HENT:  Head: Normocephalic and atraumatic.  Eyes:  General: No scleral icterus. Extraocular Movements: Extraocular movements  intact.  Conjunctiva/sclera: Conjunctivae normal.  Cardiovascular:  Rate and Rhythm: Normal rate and regular  rhythm.  Pulmonary:  Effort: Pulmonary effort is normal. No respiratory distress.  Breath sounds: Normal breath sounds.  Musculoskeletal:  General: Normal range of motion.  Cervical back: Normal range of motion and neck supple.  Comments: Head lacerations with good interval healing. All sutures visualized for removal.   Left hand with significant bruising from PIPs to upper wrist, interval resolution present. There is a cystic lesion, nonmobile, mildly tender to manipulation present in her posterior central wrist.  Skin: General: Skin is warm and dry.  Coloration: Skin is not jaundiced.  Findings: No erythema or rash.  Neurological:  General: No focal deficit present.  Mental Status: She is alert.  Cranial Nerves: No cranial nerve deficit.  Coordination: Coordination normal.   Assessment & Plan  Diagnoses and all orders for this visit:  Visit for suture removal - Running central scalp suture and 2x interrupted suture of right scalp removed without difficulty. Area clean with good interval healing to wound.  - Recommend gentle washing with soap and water. Avoid aggressive scrubbing. Keep area clean and dry. Reviewed warning s/s for wound healing or new onset infection requiring further evaluation.  Wrist lesion - Possible ganglion cyst vs healing blister pocket, difficult to evaluate fully with overall bruising and injury from her recent fall. Reviewed possible diagnoses. If pain, worsening swelling, numbness/tingling or movement limitations recommend earlier evaluation. Otherwise recommend evaluation by orthopedics at her follow up if symptoms do not resolve with the rest of her injuries.   PAIN:  Are you having pain? Yes: NPRS scale: 2/10 Pain location: L shoulder Pain description: sharp/aching Aggravating factors: movement/ brushing teeth Relieving factors: rest/ use  of sling  PRECAUTIONS: Shoulder  RED FLAGS: None   WEIGHT BEARING RESTRICTIONS: No  FALLS:  Has patient fallen in last 6 months? Yes. Number of falls 2  LIVING ENVIRONMENT: Lives with: lives with their spouse Lives in: House/apartment Has following equipment at home: Single point cane and Wheelchair (manual)  OCCUPATION: Retired  PLOF: Independent with household mobility with device  PATIENT GOALS:  Increase L shoulder AROM/ strength/ pain-free mobility.    NEXT MD VISIT: 11/14/23 with Dr. Franco Collet (ortho MD)  OBJECTIVE:  Note: Objective measures were completed at Evaluation unless otherwise noted.  DIAGNOSTIC FINDINGS:  See imaging  PATIENT SURVEYS:  FOTO initial 32/ goal 4   COGNITION: Overall cognitive status: Within functional limits for tasks assessed     SENSATION: WFL.  Moderate L hand bruising.    POSTURE: Rounded shoulders/ forward posture.  Use of L shoulder sling.    UPPER EXTREMITY ROM:  Full tear of R supraspinatus reported.    Passive ROM Right Eval AROM Left Eval PROM  Shoulder flexion 74 deg. 53 deg.  Shoulder extension    Shoulder abduction 70 deg. 42 deg.  Shoulder adduction    Shoulder internal rotation  [facility]  Shoulder external rotation  0 deg.  Elbow flexion [facility] WFL  Elbow extension [facility] WFL  Wrist flexion    Wrist extension    Wrist ulnar deviation    Wrist radial deviation    Wrist pronation    Wrist supination    (Blank rows = not tested)  UPPER EXTREMITY MMT:  No MMT testing today.    SHOULDER SPECIAL TESTS: NT  JOINT MOBILITY TESTING:  No L shoulder joint mobs. Secondary to anterior dislocation.    PALPATION:  Generalized L hand tenderness/ bruising.  TREATMENT DATE: 11/06/23  Subjective:  Manual tx.:  Supine elevated head on mat table L shoulder PROM (all planes)- as tolerated.  Limited  ROM with focus on flexion/ abduction.   There.ex.:    PATIENT EDUCATION: Education details: Reviewed HEP Person educated: Patient and Spouse Education method: Medical illustrator Education comprehension: verbalized understanding and returned demonstration  HOME EXERCISE PROGRAM: Access Code: [email or web address] URL: [email or web address] Date: 11/06/2023 Prepared by: Dorene Grebe  Exercises - Seated Scapular Retraction  - 1 x daily - 7 x weekly - 2 sets - 10 reps - Standing Isometric Shoulder External Rotation with Doorway  - 1 x daily - 7 x weekly - 2 sets - 10 reps - 5-10sec hold - Isometric Shoulder Abduction at Wall  - 1 x daily - 7 x weekly - 2 sets - 10 reps - 5-10 sec hold - Isometric Shoulder Flexion at Wall  - 1 x daily - 7 x weekly - 2 sets - 10 reps - 5-10 sec hold   ASSESSMENT:  CLINICAL IMPRESSION: Patient is a pleasant 83 y.o. female who was seen today for physical therapy evaluation and treatment for L shoulder pain/limitations after recent anterior sh. Dislocation from fall/ relocation at ER.  Pt. Reports minimal L shoulder pain and presents with moderate L shoulder joint stiffness/limitations.  Pt. Using a w/c currently with household/community mobility secondary to inability to use L shoulder/SPC.  Pt. Will benefit from skilled PT services to increase L shoulder ROM/ strength to promote return to PLOF/ mobility with SPC.       OBJECTIVE IMPAIRMENTS: decreased activity tolerance, decreased endurance, decreased ROM, decreased strength, hypomobility, impaired flexibility, impaired sensation, improper body mechanics, postural dysfunction, and pain.   ACTIVITY LIMITATIONS: carrying, lifting, bathing, toileting, dressing, self feeding, reach over head, and hygiene/grooming  PARTICIPATION LIMITATIONS: meal prep, cleaning, laundry, and shopping  PERSONAL FACTORS: Fitness and Past/current experiences are also affecting patient's functional outcome.   REHAB  POTENTIAL: Good  CLINICAL DECISION MAKING: Stable/uncomplicated  EVALUATION COMPLEXITY: Moderate   GOALS: Goals reviewed with patient? Yes  SHORT TERM GOALS: Target date: 11/26/23  Pt. Independent with HEP to increase L shoulder PROM to 90 deg. Flexion/ abduction in seated posture to improve L sh. Joint capsule.   Baseline:  See above Goal status: INITIAL  2.  Pt. Able to complete HEP with no increase c/o L shoulder pain to improve L sh. Joint mobility/ stability.   Baseline:  Goal status: INITIAL  LONG TERM GOALS: Target date: 12/17/23  Pt. Will increase FOTO to 61 to improve pain-free mobility.   Baseline: initial 32 Goal status: INITIAL  2.  Pt. Will increase L shoulder AROM to >90 deg. Flexion to improve management of hair/ feeding.   Baseline: no L shoulder AROM at this time.   Goal status: INITIAL  3.  Pt. Able to ambulate with use of SPC on L with consistent recip. Gait pattern and on c/o L shoulder pain to improve household mobility.   Baseline: Pt. Unable to use L shoulder/ SPC at this time.  Goal status: INITIAL   PLAN:  PT FREQUENCY: 2x/week  PT DURATION: 6 weeks  PLANNED INTERVENTIONS: 97110-Therapeutic exercises, 97530- Therapeutic activity, [id number]- Neuromuscular re-education, 97535- Self Care, 54098- Manual therapy, Patient/Family education, Joint mobilization, DME instructions, Cryotherapy, and Moist heat  PLAN FOR NEXT SESSION: L shoulder AA/PROM flexion/ abduction <90 deg.  MD f/u on 1/24.     Cammie Mcgee, PT, DPT # [phone] 11/11/2023, 2:29 PM

## 2023-11-12 ENCOUNTER — Encounter: Payer: Self-pay | Admitting: Physical Therapy

## 2023-11-12 NOTE — Therapy (Signed)
OUTPATIENT PHYSICAL THERAPY SHOULDER TREATMENT  Patient Name: Evelyn Cisneros MRN: [id number] DOB:Feb 21, 1941, 83 y.o., female Today's Date: 11/12/2023  END OF SESSION:  PT End of Session - 11/12/23 1026     Visit Number 3    Number of Visits 12    Date for PT Re-Evaluation 12/17/23    PT Start Time 1049    PT Stop Time 1144    PT Time Calculation (min) 55 min    Activity Tolerance Patient limited by pain            Past Medical History:  Diagnosis Date   Anemia    Arthritis    Heart murmur    Hypertension    Hypothyroidism    Spondylolisthesis of lumbar region    Past Surgical History:  Procedure Laterality Date   ABDOMINAL HYSTERECTOMY     REPLACEMENT TOTAL KNEE BILATERAL     SPINE SURGERY     Patient Active Problem List   Diagnosis Date Noted   Spondylolisthesis of lumbar region 05/04/2018   Abnormal glucose level 02/12/2018   Abnormal weight gain 02/12/2018   Allergic rhinitis 02/12/2018   Asthma 02/12/2018   Bradycardia 02/12/2018   Edema 02/12/2018   Heart murmur 02/12/2018   Hypercalcemia 02/12/2018   Hypokalemia 02/12/2018   Pure hypercholesterolemia 02/12/2018   Shoulder joint pain 02/12/2018   Skin sensation disturbance 02/12/2018   Syncope 02/12/2018   Vitamin D deficiency 02/12/2018   Central cervical cord injury, 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, 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) 04/24/2017   Muscle spasticity 04/24/2017   Weight loss 12/19/2016   Hypothyroidism 11/24/2016   Bilateral leg edema 11/24/2016   Cervical radiculopathy 11/24/2016   Disc degeneration, lumbar 11/24/2016   Essential hypertension 11/24/2016   Gallstones 11/24/2016   History of anxiety state 11/24/2016   Hyperlipidemia, unspecified 11/24/2016   Spinal stenosis of lumbar region with neurogenic claudication 11/08/2016   Anxiety 06/08/2015   PCP: Verner Mould, MD  REFERRING PROVIDER: Michell Heinrich, PA-C  REFERRING DIAG: [phone] (ICD-10-CM) - Anterior dislocation of left humerus    THERAPY DIAG:  Acute pain of left shoulder  Post-traumatic stiffness of left shoulder joint  Muscle weakness (generalized)  Rationale for Evaluation and Treatment: Rehabilitation  ONSET DATE: 10/20/23  SUBJECTIVE:                                                                                                                                                                                      SUBJECTIVE STATEMENT: Pt. Reports falling while pushing shopping cart in parking lot at [place].  Pt. Hit head resulting in lacerations/ stitches and L anterior shoulder dislocation.  Pt. Reports 0/10 L shoulder  pain at rest and 2/10 pain with eating/brushing teeth.  Pt. Was using SPC on L prior to fall but limited due to shoulder sling.  Pt. Entered PT in w/c today due to inability to use L UE at this time.  Hand dominance: Left  PERTINENT HISTORY: Subjective   Evelyn Cisneros is a 83 y.o. female in today for: HPI: History of Present Illness  Patient here today for suture removal. She underwent lac repair of her forehead after a fall on concrete 12/30. Repaired 2 lacs with 2 interrupted sutures and 6 running locked sutures.   Today, patient reports healing well. She has had a nodule come up on her left wrist. It is mildly tender to palpation, does not bother her otherwise.   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 90 tablet 3  atorvastatin (LIPITOR) 40 MG tablet TAKE 1 TABLET(40 MG) BY MOUTH EVERY DAY 90 tablet 3  biotin 5 mg Tab Take by mouth  busPIRone (BUSPAR) 5 MG tablet Take 1 tablet (5 mg total) by mouth 2 (two) times daily as needed 30 tablet 5  calcium lactate 100 mg calcium Tab Take by mouth  cholecalciferol, vitamin D3, (VITAMIN D3) 125 mcg (5,000 unit) Tab Take 1 tablet by mouth every morning 90 tablet 3  co-enzyme Q-10, ubiquinone, 200 mg capsule Take 200 mg by mouth every morning.   cyanocobalamin (VITAMIN B12) 1,000 mcg SL tablet Take by mouth  ferrous sulfate 325 (65 FE) MG tablet Take 1 tablet (325 mg total) by mouth twice a week 90 tablet 3  FUROsemide (LASIX) 20 MG tablet Take 1-2 tablets (20-40 mg total) by mouth once daily as needed for Edema 60 tablet 4  gabapentin (NEURONTIN) 300 MG capsule TAKE ONE CAPSULE BY MOUTH THREE TIMES DAILY TO FOUR TIMES DAILY AS NEEDED FOR PAIN 360 capsule 3  Lactobacillus acidophilus (PROBIOTIC ORAL) Take by mouth once daily  levothyroxine (SYNTHROID) 50 MCG tablet Take 1.5 tablets (75 mcg total) by mouth once daily 135 tablet 3  lidocaine (LIDODERM) 5 % patch as needed  losartan (COZAAR) 100 MG tablet TAKE 1 TABLET(100 MG) BY MOUTH EVERY DAY 90 tablet 3  MAGNESIUM ORAL Take by mouth  VIT C/VIT E AC/LUT/COPPER/ZINC (PRESERVISION LUTEIN ORAL) Take 1 tablet by mouth every morning. Last dose one week prior to procedure  ZINC ORAL Take 50 mg by mouth once daily   No facility-administered medications prior to visit.    Objective   Vitals:  10/30/23 1350  PainSc: 0-No pain   There is no height or weight on file to calculate BMI. Home Vitals:  Physical Exam Constitutional:  General: She is not in acute distress. Appearance: Normal appearance.  HENT:  Head: Normocephalic and atraumatic.  Eyes:  General: No scleral icterus. Extraocular Movements: Extraocular movements  intact.  Conjunctiva/sclera: Conjunctivae normal.  Cardiovascular:  Rate and Rhythm: Normal rate and regular rhythm.  Pulmonary:  Effort: Pulmonary effort is normal. No respiratory distress.  Breath sounds: Normal breath sounds.  Musculoskeletal:  General: Normal range of motion.  Cervical back: Normal range of motion and neck supple.  Comments: Head lacerations with good interval healing. All sutures visualized for removal.   Left hand with significant bruising from PIPs to upper wrist, interval resolution present. There is a cystic lesion, nonmobile, mildly tender to manipulation present in her posterior central wrist.  Skin: General: Skin is warm and dry.  Coloration: Skin is not jaundiced.  Findings: No erythema or rash.  Neurological:  General: No focal deficit present.  Mental Status: She is alert.  Cranial Nerves: No cranial nerve deficit.  Coordination: Coordination normal.   Assessment & Plan  Diagnoses and all orders for this visit:  Visit for suture removal - Running central scalp suture and 2x interrupted suture of right scalp removed without difficulty. Area clean with good interval healing to wound.  - Recommend gentle washing with soap and water. Avoid aggressive scrubbing. Keep area clean and dry. Reviewed warning s/s for wound healing or new onset infection requiring further evaluation.  Wrist lesion - Possible ganglion cyst vs healing blister pocket, difficult to evaluate fully with overall bruising and injury from her recent fall. Reviewed possible diagnoses. If pain, worsening swelling, numbness/tingling or movement limitations recommend earlier evaluation. Otherwise recommend evaluation by orthopedics at her follow up if symptoms do not resolve with the rest of her injuries.   PAIN:  Are you having pain? Yes: NPRS scale: 2/10 Pain location: L shoulder Pain description: sharp/aching Aggravating factors: movement/ brushing teeth Relieving factors: rest/ use  of sling  PRECAUTIONS: Shoulder  RED FLAGS: None   WEIGHT BEARING RESTRICTIONS: No  FALLS:  Has patient fallen in last 6 months? Yes. Number of falls 2  LIVING ENVIRONMENT: Lives with: lives with their spouse Lives in: House/apartment Has following equipment at home: Single point cane and Wheelchair (manual)  OCCUPATION: Retired  PLOF: Independent with household mobility with device  PATIENT GOALS:  Increase L shoulder AROM/ strength/ pain-free mobility.    NEXT MD VISIT: 11/14/23 with Dr. Franco Collet (ortho MD)  OBJECTIVE:  Note: Objective measures were completed at Evaluation unless otherwise noted.  DIAGNOSTIC FINDINGS:  See imaging  PATIENT SURVEYS:  FOTO initial 32/ goal 15   COGNITION: Overall cognitive status: Within functional limits for tasks assessed     SENSATION: WFL.  Moderate L hand bruising.    POSTURE: Rounded shoulders/ forward posture.  Use of L shoulder sling.    UPPER EXTREMITY ROM:  Full tear of R supraspinatus reported.    Passive ROM Right Eval AROM Left Eval PROM  Shoulder flexion 74 deg. 53 deg.  Shoulder extension    Shoulder abduction 70 deg. 42 deg.  Shoulder adduction    Shoulder internal rotation  [facility]  Shoulder external rotation  0 deg.  Elbow flexion [facility] WFL  Elbow extension [facility] WFL  Wrist flexion    Wrist extension    Wrist ulnar deviation    Wrist radial deviation    Wrist pronation    Wrist supination    (Blank rows = not tested)  UPPER EXTREMITY MMT:  No MMT testing today.    SHOULDER SPECIAL TESTS: NT  JOINT MOBILITY TESTING:  No L shoulder joint mobs. Secondary to anterior dislocation.    PALPATION:  Generalized L hand tenderness/ bruising.  TREATMENT DATE: 11/12/2023  Subjective: Pt. Reports no pain at rest and compliant with use of shoulder sling.  Pt. Has appt. With Ortho MD this  week (Dr. Franco Collet).  Improvement in L hand bruising noted today.      Manual tx.:  Supine elevated head on mat table L shoulder PROM (all planes)- as tolerated (90 deg. Flexion/ abduction)- 5 deg. L shoulder ER PROM (pain).  Limited ROM with focus on flexion/ abduction (22 min.)  STM to L UT/deltoid/ bicep/ tricep musculature in supine/ seated position.    Seated L elbow flexion/ extension in supinated position with slight OP from PT.  Pt. Limited by lack of ER.   There.ex.:  Reviewed HEP (Access Code: [email or web address])  Pt. Will ice L shoulder consistently at home.     PATIENT EDUCATION: Education details: Reviewed HEP Person educated: Patient and Spouse Education method: Medical illustrator Education comprehension: verbalized understanding and returned demonstration  HOME EXERCISE PROGRAM: Access Code: [email or web address] URL: [email or web address] Date: 11/06/2023 Prepared by: Dorene Grebe  Exercises - Seated Scapular Retraction  - 1 x daily - 7 x weekly - 2 sets - 10 reps - Standing Isometric Shoulder External Rotation with Doorway  - 1 x daily - 7 x weekly - 2 sets - 10 reps - 5-10sec hold - Isometric Shoulder Abduction at Wall  - 1 x daily - 7 x weekly - 2 sets - 10 reps - 5-10 sec hold - Isometric Shoulder Flexion at Wall  - 1 x daily - 7 x weekly - 2 sets - 10 reps - 5-10 sec hold   ASSESSMENT:  CLINICAL IMPRESSION: Pt. Reports minimal L shoulder pain and presents with moderate L shoulder joint stiffness/limitations.  Tx. Focused on L shoulder PROM in pain tolerable range, avoiding extension.  Pt. Able to tolerate 90 deg. Flexion/ abduction PROM (prefers scaption).  Pt. Remains limited by joint stiffness/ pain.  Pt. Will benefit from skilled PT services to increase L shoulder ROM/ strength to promote return to PLOF/ mobility with SPC.       OBJECTIVE IMPAIRMENTS: decreased activity tolerance, decreased endurance, decreased ROM, decreased strength, hypomobility,  impaired flexibility, impaired sensation, improper body mechanics, postural dysfunction, and pain.   ACTIVITY LIMITATIONS: carrying, lifting, bathing, toileting, dressing, self feeding, reach over head, and hygiene/grooming  PARTICIPATION LIMITATIONS: meal prep, cleaning, laundry, and shopping  PERSONAL FACTORS: Fitness and Past/current experiences are also affecting patient's functional outcome.   REHAB POTENTIAL: Good  CLINICAL DECISION MAKING: Stable/uncomplicated  EVALUATION COMPLEXITY: Moderate   GOALS: Goals reviewed with patient? Yes  SHORT TERM GOALS: Target date: 11/26/23  Pt. Independent with HEP to increase L shoulder PROM to 90 deg. Flexion/ abduction in seated posture to improve L sh. Joint capsule.   Baseline:  See above Goal status: INITIAL  2.  Pt. Able to complete HEP with no increase c/o L shoulder pain to improve L sh. Joint mobility/ stability.   Baseline:  Goal status: INITIAL  LONG TERM GOALS: Target date: 12/17/23  Pt. Will increase FOTO to 61 to improve pain-free mobility.   Baseline: initial 32 Goal status: INITIAL  2.  Pt. Will increase L shoulder AROM to >90 deg. Flexion to improve management of hair/ feeding.   Baseline: no L shoulder AROM at this time.   Goal status: INITIAL  3.  Pt. Able to ambulate with use of SPC on L with consistent recip. Gait pattern and on c/o L shoulder pain to improve household mobility.   Baseline: Pt. Unable to  use L shoulder/ SPC at this time.  Goal status: INITIAL   PLAN:  PT FREQUENCY: 2x/week  PT DURATION: 6 weeks  PLANNED INTERVENTIONS: 97110-Therapeutic exercises, 97530- Therapeutic activity, [id number]- Neuromuscular re-education, 97535- Self Care, [id number]- Manual therapy, Patient/Family education, Joint mobilization, DME instructions, Cryotherapy, and Moist heat  PLAN FOR NEXT SESSION: Discuss MD f/u   Cammie Mcgee, PT, DPT # [phone] 11/12/2023, 10:35 AM

## 2023-11-18 ENCOUNTER — Ambulatory Visit: Payer: Medicare Other | Admitting: Physical Therapy

## 2023-11-18 ENCOUNTER — Encounter: Payer: Self-pay | Admitting: Physical Therapy

## 2023-11-18 DIAGNOSIS — M25512 Pain in left shoulder: Secondary | ICD-10-CM

## 2023-11-18 DIAGNOSIS — M25612 Stiffness of left shoulder, not elsewhere classified: Secondary | ICD-10-CM

## 2023-11-18 DIAGNOSIS — M6281 Muscle weakness (generalized): Secondary | ICD-10-CM

## 2023-11-18 NOTE — Therapy (Signed)
OUTPATIENT PHYSICAL THERAPY SHOULDER TREATMENT  Patient Name: Evelyn Cisneros MRN: [id number] DOB:1940/12/29, 83 y.o., female Today's Date: 11/18/2023  END OF SESSION:  PT End of Session - 11/18/23 1047     Visit Number 4    Number of Visits 12    Date for PT Re-Evaluation 12/17/23            Past Medical History:  Diagnosis Date   Anemia    Arthritis    Heart murmur    Hypertension    Hypothyroidism    Spondylolisthesis of lumbar region    Past Surgical History:  Procedure Laterality Date   ABDOMINAL HYSTERECTOMY     REPLACEMENT TOTAL KNEE BILATERAL     SPINE SURGERY     Patient Active Problem List   Diagnosis Date Noted   Spondylolisthesis of lumbar region 05/04/2018   Abnormal glucose level 02/12/2018   Abnormal weight gain 02/12/2018   Allergic rhinitis 02/12/2018   Asthma 02/12/2018   Bradycardia 02/12/2018   Edema 02/12/2018   Heart murmur 02/12/2018   Hypercalcemia 02/12/2018   Hypokalemia 02/12/2018   Pure hypercholesterolemia 02/12/2018   Shoulder joint pain 02/12/2018   Skin sensation disturbance 02/12/2018   Syncope 02/12/2018   Vitamin D deficiency 02/12/2018   Central cervical cord injury, 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, 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) 04/24/2017   Muscle spasticity 04/24/2017   Weight loss 12/19/2016   Hypothyroidism 11/24/2016   Bilateral leg edema 11/24/2016   Cervical radiculopathy 11/24/2016   Disc degeneration, lumbar 11/24/2016   Essential hypertension 11/24/2016   Gallstones 11/24/2016   History of anxiety state 11/24/2016   Hyperlipidemia, unspecified 11/24/2016   Spinal stenosis of lumbar region with neurogenic claudication 11/08/2016   Anxiety 06/08/2015   PCP: Verner Mould, MD  REFERRING PROVIDER: Michell Heinrich, PA-C  REFERRING DIAG: [id number] (ICD-10-CM) - Anterior dislocation of left humerus   THERAPY DIAG:  Acute pain of left shoulder  Post-traumatic stiffness of left shoulder joint  Muscle weakness  (generalized)  Rationale for Evaluation and Treatment: Rehabilitation  ONSET DATE: 10/20/23  SUBJECTIVE:                                                                                                                                                                                      SUBJECTIVE STATEMENT: Pt. Reports falling while pushing shopping cart in parking lot at [employer].  Pt. Hit head resulting in lacerations/ stitches and L anterior shoulder dislocation.  Pt. Reports 0/10 L shoulder pain at rest and 2/10 pain with eating/brushing teeth.  Pt. Was using SPC on L prior to fall but limited due to shoulder sling.  Pt. Entered PT in w/c today  due to inability to use L UE at this time.  Hand dominance: Left  PERTINENT HISTORY: Subjective   Evelyn Cisneros is a 83 y.o. female in today for: HPI: History of Present Illness  Patient here today for suture removal. She underwent lac repair of her forehead after a fall on concrete 12/30. Repaired 2 lacs with 2 interrupted sutures and 6 running locked sutures.   Today, patient reports healing well. She has had a nodule come up on her left wrist. It is mildly tender to palpation, does not bother her otherwise.   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 90 tablet 3  atorvastatin (LIPITOR) 40 MG  tablet TAKE 1 TABLET(40 MG) BY MOUTH EVERY DAY 90 tablet 3  biotin 5 mg Tab Take by mouth  busPIRone (BUSPAR) 5 MG tablet Take 1 tablet (5 mg total) by mouth 2 (two) times daily as needed 30 tablet 5  calcium lactate 100 mg calcium Tab Take by mouth  cholecalciferol, vitamin D3, (VITAMIN D3) 125 mcg (5,000 unit) Tab Take 1 tablet by mouth every morning 90 tablet 3  co-enzyme Q-10, ubiquinone, 200 mg capsule Take 200 mg by mouth every morning.   cyanocobalamin (VITAMIN B12) 1,000 mcg SL tablet Take by mouth  ferrous sulfate 325 (65 FE) MG tablet Take 1 tablet (325 mg total) by mouth twice a week 90 tablet 3  FUROsemide (LASIX) 20 MG tablet Take 1-2 tablets (20-40 mg total) by mouth once daily as needed for Edema 60 tablet 4  gabapentin (NEURONTIN) 300 MG capsule TAKE ONE CAPSULE BY MOUTH THREE TIMES DAILY TO FOUR TIMES DAILY AS NEEDED FOR PAIN 360 capsule 3  Lactobacillus acidophilus (PROBIOTIC ORAL) Take by mouth once daily  levothyroxine (SYNTHROID) 50 MCG tablet Take 1.5 tablets (75 mcg total) by mouth once daily 135 tablet 3  lidocaine (LIDODERM) 5 % patch as needed  losartan (COZAAR) 100 MG tablet TAKE 1 TABLET(100 MG) BY MOUTH EVERY DAY 90 tablet 3  MAGNESIUM ORAL Take by mouth  VIT C/VIT E AC/LUT/COPPER/ZINC (PRESERVISION LUTEIN ORAL) Take 1 tablet by mouth every morning. Last dose one week prior to procedure  ZINC ORAL Take 50 mg by mouth once daily   No facility-administered medications prior to visit.    Objective   Vitals:  10/30/23 1350  PainSc: 0-No pain   There is no height or weight on file to calculate BMI. Home Vitals:  Physical Exam Constitutional:  General: She is not in acute distress. Appearance: Normal appearance.  HENT:  Head: Normocephalic and atraumatic.  Eyes:  General: No scleral icterus. Extraocular Movements: Extraocular movements intact.  Conjunctiva/sclera: Conjunctivae normal.  Cardiovascular:  Rate and Rhythm: Normal rate and regular rhythm.   Pulmonary:  Effort: Pulmonary effort is normal. No respiratory distress.  Breath sounds: Normal breath sounds.  Musculoskeletal:  General: Normal range of motion.  Cervical back: Normal range of motion  and neck supple.  Comments: Head lacerations with good interval healing. All sutures visualized for removal.   Left hand with significant bruising from PIPs to upper wrist, interval resolution present. There is a cystic lesion, nonmobile, mildly tender to manipulation present in her posterior central wrist.  Skin: General: Skin is warm and dry.  Coloration: Skin is not jaundiced.  Findings: No erythema or rash.  Neurological:  General: No focal deficit present.  Mental Status: She is alert.  Cranial Nerves: No cranial nerve deficit.  Coordination: Coordination normal.   Assessment & Plan  Diagnoses and all orders for this visit:  Visit for suture removal - Running central scalp suture and 2x interrupted suture of right scalp removed without difficulty. Area clean with good interval healing to wound.  - Recommend gentle washing with soap and water. Avoid aggressive scrubbing. Keep area clean and dry. Reviewed warning s/s for wound healing or new onset infection requiring further evaluation.  Wrist lesion - Possible ganglion cyst vs healing blister pocket, difficult to evaluate fully with overall bruising and injury from her recent fall. Reviewed possible diagnoses. If pain, worsening swelling, numbness/tingling or movement limitations recommend earlier evaluation. Otherwise recommend evaluation by orthopedics at her follow up if symptoms do not resolve with the rest of her injuries.   PAIN:  Are you having pain? Yes: NPRS scale: 2/10 Pain location: L shoulder Pain description: sharp/aching Aggravating factors: movement/ brushing teeth Relieving factors: rest/ use of sling  PRECAUTIONS: Shoulder  RED FLAGS: None   WEIGHT BEARING RESTRICTIONS: No  FALLS:  Has patient fallen  in last 6 months? Yes. Number of falls 2  LIVING ENVIRONMENT: Lives with: lives with their spouse Lives in: House/apartment Has following equipment at home: Single point cane and Wheelchair (manual)  OCCUPATION: Retired  PLOF: Independent with household mobility with device  PATIENT GOALS:  Increase L shoulder AROM/ strength/ pain-free mobility.    NEXT MD VISIT: 11/14/23 with Dr. Franco Collet (ortho MD)  OBJECTIVE:  Note: Objective measures were completed at Evaluation unless otherwise noted.  DIAGNOSTIC FINDINGS:  See imaging  PATIENT SURVEYS:  FOTO initial 32/ goal 74   COGNITION: Overall cognitive status: Within functional limits for tasks assessed     SENSATION: WFL.  Moderate L hand bruising.    POSTURE: Rounded shoulders/ forward posture.  Use of L shoulder sling.    UPPER EXTREMITY ROM:  Full tear of R supraspinatus reported.    Passive ROM Right Eval AROM Left Eval PROM  Shoulder flexion 74 deg. 53 deg.  Shoulder extension    Shoulder abduction 70 deg. 42 deg.  Shoulder adduction    Shoulder internal rotation  [facility]  Shoulder external rotation  0 deg.  Elbow flexion [facility] WFL  Elbow extension [facility] WFL  Wrist flexion    Wrist extension    Wrist ulnar deviation    Wrist radial deviation    Wrist pronation    Wrist supination    (Blank rows = not tested)  UPPER EXTREMITY MMT:  No MMT testing today.    SHOULDER SPECIAL TESTS: NT  JOINT MOBILITY TESTING:  No L shoulder joint mobs. Secondary to anterior dislocation.    PALPATION:  Generalized L hand tenderness/ bruising.  TREATMENT DATE: 11/18/2023  Subjective: Pt. Reports no pain at rest and compliant with use of shoulder sling.  Pt. Has appt. With Ortho MD this week (Dr. Franco Collet).  Improvement in L hand bruising noted today.      Manual tx.:  Supine elevated head on mat table L  shoulder PROM (all planes)- as tolerated (90 deg. Flexion/ abduction)- 5 deg. L shoulder ER PROM (pain).  Limited ROM with focus on flexion/ abduction (22 min.)  STM to L UT/deltoid/ bicep/ tricep musculature in supine/ seated position.    Seated L elbow flexion/ extension in supinated position with slight OP from PT.  Pt. Limited by lack of ER.   There.ex.:  Reviewed HEP (Access Code: [email or web address])  Pt. Will ice L shoulder consistently at home.     PATIENT EDUCATION: Education details: Reviewed HEP Person educated: Patient and Spouse Education method: Medical illustrator Education comprehension: verbalized understanding and returned demonstration  HOME EXERCISE PROGRAM: Access Code: [email or web address] URL: [email or web address] Date: 11/06/2023 Prepared by: Dorene Grebe  Exercises - Seated Scapular Retraction  - 1 x daily - 7 x weekly - 2 sets - 10 reps - Standing Isometric Shoulder External Rotation with Doorway  - 1 x daily - 7 x weekly - 2 sets - 10 reps - 5-10sec hold - Isometric Shoulder Abduction at Wall  - 1 x daily - 7 x weekly - 2 sets - 10 reps - 5-10 sec hold - Isometric Shoulder Flexion at Wall  - 1 x daily - 7 x weekly - 2 sets - 10 reps - 5-10 sec hold   Access Code: [email or web address] URL: [email or web address] Date: 11/18/2023 Prepared by: Dorene Grebe  Exercises - Seated Scapular Retraction  - 1 x daily - 7 x weekly - 2 sets - 10 reps - Supine Shoulder External Rotation AAROM with Dowel  - 1 x daily - 7 x weekly - 3 sets - 10 reps - Supine Shoulder Flexion Overhead with Dowel  - 1 x daily - 7 x weekly - 3 sets - 10 reps - Supine Shoulder Press with Dowel  - 1 x daily - 7 x weekly - 3 sets - 10 reps - Supine Shoulder Abduction AAROM with Dowel  - 1 x daily - 7 x weekly - 3 sets - 10 reps - Supine Shoulder Flexion AAROM with Dowel  - 1 x daily - 7 x weekly - 3 sets - 10 reps  ASSESSMENT:  CLINICAL IMPRESSION: Pt. Reports minimal L  shoulder pain and presents with moderate L shoulder joint stiffness/limitations.  Tx. Focused on L shoulder PROM in pain tolerable range, avoiding extension.  Pt. Able to tolerate 90 deg. Flexion/ abduction PROM (prefers scaption).  Pt. Remains limited by joint stiffness/ pain.  Pt. Will benefit from skilled PT services to increase L shoulder ROM/ strength to promote return to PLOF/ mobility with SPC.       OBJECTIVE IMPAIRMENTS: decreased activity tolerance, decreased endurance, decreased ROM, decreased strength, hypomobility, impaired flexibility, impaired sensation, improper body mechanics, postural dysfunction, and pain.   ACTIVITY LIMITATIONS: carrying, lifting, bathing, toileting, dressing, self feeding, reach over head, and hygiene/grooming  PARTICIPATION LIMITATIONS: meal prep, cleaning, laundry, and shopping  PERSONAL FACTORS: Fitness and Past/current experiences are also affecting patient's functional outcome.   REHAB POTENTIAL: Good  CLINICAL DECISION MAKING: Stable/uncomplicated  EVALUATION COMPLEXITY: Moderate   GOALS: Goals reviewed with patient? Yes  SHORT TERM GOALS: Target date: 11/26/23  Pt. Independent with HEP to increase L shoulder PROM to 90 deg.  Flexion/ abduction in seated posture to improve L sh. Joint capsule.   Baseline:  See above Goal status: INITIAL  2.  Pt. Able to complete HEP with no increase c/o L shoulder pain to improve L sh. Joint mobility/ stability.   Baseline:  Goal status: INITIAL  LONG TERM GOALS: Target date: 12/17/23  Pt. Will increase FOTO to 61 to improve pain-free mobility.   Baseline: initial 32 Goal status: INITIAL  2.  Pt. Will increase L shoulder AROM to >90 deg. Flexion to improve management of hair/ feeding.   Baseline: no L shoulder AROM at this time.   Goal status: INITIAL  3.  Pt. Able to ambulate with use of SPC on L with consistent recip. Gait pattern and on c/o L shoulder pain to improve household mobility.   Baseline:  Pt. Unable to use L shoulder/ SPC at this time.  Goal status: INITIAL   PLAN:  PT FREQUENCY: 2x/week  PT DURATION: 6 weeks  PLANNED INTERVENTIONS: 97110-Therapeutic exercises, 97530- Therapeutic activity, [id number]- Neuromuscular re-education, 97535- Self Care, 82956- Manual therapy, Patient/Family education, Joint mobilization, DME instructions, Cryotherapy, and Moist heat  PLAN FOR NEXT SESSION: Discuss MD f/u   Cammie Mcgee, PT, DPT # [phone] 11/18/2023, 10:47 AM

## 2023-11-20 ENCOUNTER — Ambulatory Visit: Payer: Medicare Other | Admitting: Physical Therapy

## 2023-11-20 DIAGNOSIS — M25512 Pain in left shoulder: Secondary | ICD-10-CM | POA: Diagnosis not present

## 2023-11-20 DIAGNOSIS — M6281 Muscle weakness (generalized): Secondary | ICD-10-CM

## 2023-11-20 DIAGNOSIS — M25612 Stiffness of left shoulder, not elsewhere classified: Secondary | ICD-10-CM

## 2023-11-20 NOTE — Therapy (Signed)
OUTPATIENT PHYSICAL THERAPY SHOULDER TREATMENT  Patient Name: Evelyn Cisneros MRN: [id number] DOB:06/13/1941, 83 y.o., female Today's Date: 11/21/2023  END OF SESSION:  PT End of Session - 11/20/23 1503     Visit Number 5    Number of Visits 12    Date for PT Re-Evaluation 12/17/23    PT Start Time 1503    PT Stop Time 1547    PT Time Calculation (min) 44 min             Past Medical History:  Diagnosis Date   Anemia    Arthritis    Heart murmur    Hypertension    Hypothyroidism    Spondylolisthesis of lumbar region    Past Surgical History:  Procedure Laterality Date   ABDOMINAL HYSTERECTOMY     REPLACEMENT TOTAL KNEE BILATERAL     SPINE SURGERY     Patient Active Problem List   Diagnosis Date Noted   Spondylolisthesis of lumbar region 05/04/2018   Abnormal glucose level 02/12/2018   Abnormal weight gain 02/12/2018   Allergic rhinitis 02/12/2018   Asthma 02/12/2018   Bradycardia 02/12/2018   Edema 02/12/2018   Heart murmur 02/12/2018   Hypercalcemia 02/12/2018   Hypokalemia 02/12/2018   Pure hypercholesterolemia 02/12/2018   Shoulder joint pain 02/12/2018   Skin sensation disturbance 02/12/2018   Syncope 02/12/2018   Vitamin D deficiency 02/12/2018   Central cervical cord injury, 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, 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) 04/24/2017   Muscle spasticity 04/24/2017   Weight loss 12/19/2016   Hypothyroidism 11/24/2016   Bilateral leg edema 11/24/2016   Cervical radiculopathy 11/24/2016   Disc degeneration, lumbar 11/24/2016   Essential hypertension 11/24/2016   Gallstones 11/24/2016   History of anxiety state 11/24/2016   Hyperlipidemia, unspecified 11/24/2016   Spinal stenosis of lumbar region with neurogenic claudication 11/08/2016   Anxiety 06/08/2015   PCP: Verner Mould, MD  REFERRING PROVIDER: Michell Heinrich, PA-C  REFERRING DIAG: [phone] (ICD-10-CM) - Anterior dislocation of left humerus   THERAPY DIAG:  Acute pain of left  shoulder  Post-traumatic stiffness of left shoulder joint  Muscle weakness (generalized)  Rationale for Evaluation and Treatment: Rehabilitation  ONSET DATE: 10/20/23  SUBJECTIVE:                                                                                                                                                                                      SUBJECTIVE STATEMENT: Pt. Reports falling while pushing shopping cart in parking lot at [employer].  Pt. Hit head resulting in lacerations/ stitches and L anterior shoulder dislocation.  Pt. Reports 0/10 L shoulder pain at rest and 2/10 pain with eating/brushing  teeth.  Pt. Was using SPC on L prior to fall but limited due to shoulder sling.  Pt. Entered PT in w/c today due to inability to use L UE at this time.  Hand dominance: Left  PERTINENT HISTORY: Subjective   Evelyn Cisneros is a 83 y.o. female in today for: HPI: History of Present Illness  Patient here today for suture removal. She underwent lac repair of her forehead after a fall on concrete 12/30. Repaired 2 lacs with 2 interrupted sutures and 6 running locked sutures.   Today, patient reports healing well. She has had a nodule come up on her left wrist. It is mildly tender to palpation, does not bother her otherwise.   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 90 tablet 3  atorvastatin (LIPITOR) 40 MG tablet TAKE 1 TABLET(40 MG) BY MOUTH EVERY DAY 90 tablet 3  biotin 5 mg Tab Take by mouth  busPIRone (BUSPAR) 5 MG tablet Take 1 tablet (5 mg total) by mouth 2 (two) times daily as needed 30 tablet 5  calcium lactate 100 mg calcium Tab Take by mouth  cholecalciferol, vitamin D3, (VITAMIN D3) 125 mcg (5,000 unit) Tab Take 1 tablet by mouth every morning 90 tablet 3  co-enzyme Q-10, ubiquinone, 200 mg capsule Take 200 mg by mouth every morning.   cyanocobalamin (VITAMIN B12) 1,000 mcg SL tablet Take by mouth  ferrous sulfate 325 (65 FE) MG tablet Take 1 tablet (325 mg total) by mouth twice a week 90 tablet 3  FUROsemide (LASIX) 20 MG tablet Take 1-2 tablets (20-40 mg total) by mouth once daily as needed for Edema 60 tablet 4  gabapentin (NEURONTIN) 300 MG capsule TAKE ONE CAPSULE BY MOUTH THREE TIMES DAILY TO FOUR TIMES DAILY AS NEEDED FOR PAIN 360 capsule 3  Lactobacillus acidophilus (PROBIOTIC ORAL) Take by mouth once daily  levothyroxine (SYNTHROID) 50 MCG tablet Take 1.5 tablets (75 mcg total) by mouth once daily 135 tablet 3  lidocaine (LIDODERM) 5 % patch as needed  losartan (COZAAR) 100 MG tablet TAKE 1 TABLET(100 MG) BY MOUTH EVERY DAY 90 tablet 3  MAGNESIUM ORAL Take by mouth  VIT C/VIT E AC/LUT/COPPER/ZINC (PRESERVISION LUTEIN ORAL) Take 1 tablet by mouth every morning. Last dose one week prior to procedure  ZINC ORAL Take 50 mg by mouth once daily   No facility-administered medications prior to visit.    Objective   Vitals:  10/30/23 1350  PainSc: 0-No pain   There is no height or weight on file to calculate BMI. Home Vitals:  Physical Exam Constitutional:  General: She is not in acute distress. Appearance: Normal appearance.  HENT:  Head: Normocephalic and atraumatic.  Eyes:  General: No scleral icterus. Extraocular Movements: Extraocular movements intact.  Conjunctiva/sclera: Conjunctivae  normal.  Cardiovascular:  Rate and Rhythm: Normal rate and regular rhythm.  Pulmonary:  Effort: Pulmonary effort is normal.  No respiratory distress.  Breath sounds: Normal breath sounds.  Musculoskeletal:  General: Normal range of motion.  Cervical back: Normal range of motion and neck supple.  Comments: Head lacerations with good interval healing. All sutures visualized for removal.   Left hand with significant bruising from PIPs to upper wrist, interval resolution present. There is a cystic lesion, nonmobile, mildly tender to manipulation present in her posterior central wrist.  Skin: General: Skin is warm and dry.  Coloration: Skin is not jaundiced.  Findings: No erythema or rash.  Neurological:  General: No focal deficit present.  Mental Status: She is alert.  Cranial Nerves: No cranial nerve deficit.  Coordination: Coordination normal.   Assessment & Plan  Diagnoses and all orders for this visit:  Visit for suture removal - Running central scalp suture and 2x interrupted suture of right scalp removed without difficulty. Area clean with good interval healing to wound.  - Recommend gentle washing with soap and water. Avoid aggressive scrubbing. Keep area clean and dry. Reviewed warning s/s for wound healing or new onset infection requiring further evaluation.  Wrist lesion - Possible ganglion cyst vs healing blister pocket, difficult to evaluate fully with overall bruising and injury from her recent fall. Reviewed possible diagnoses. If pain, worsening swelling, numbness/tingling or movement limitations recommend earlier evaluation. Otherwise recommend evaluation by orthopedics at her follow up if symptoms do not resolve with the rest of her injuries.   PAIN:  Are you having pain? Yes: NPRS scale: 2/10 Pain location: L shoulder Pain description: sharp/aching Aggravating factors: movement/ brushing teeth Relieving factors: rest/ use of sling  PRECAUTIONS: Shoulder  RED  FLAGS: None   WEIGHT BEARING RESTRICTIONS: No  FALLS:  Has patient fallen in last 6 months? Yes. Number of falls 2  LIVING ENVIRONMENT: Lives with: lives with their spouse Lives in: House/apartment Has following equipment at home: Single point cane and Wheelchair (manual)  OCCUPATION: Retired  PLOF: Independent with household mobility with device  PATIENT GOALS:  Increase L shoulder AROM/ strength/ pain-free mobility.    NEXT MD VISIT: 11/14/23 with Dr. Franco Collet (ortho MD)  OBJECTIVE:  Note: Objective measures were completed at Evaluation unless otherwise noted.  DIAGNOSTIC FINDINGS:  See imaging  PATIENT SURVEYS:  FOTO initial 32/ goal 75   COGNITION: Overall cognitive status: Within functional limits for tasks assessed     SENSATION: WFL.  Moderate L hand bruising.    POSTURE: Rounded shoulders/ forward posture.  Use of L shoulder sling.    UPPER EXTREMITY ROM:  Full tear of R supraspinatus reported.    Passive ROM Right Eval AROM Left Eval PROM  Shoulder flexion 74 deg. 53 deg.  Shoulder extension    Shoulder abduction 70 deg. 42 deg.  Shoulder adduction    Shoulder internal rotation  [facility]  Shoulder external rotation  0 deg.  Elbow flexion [facility] WFL  Elbow extension [facility] WFL  Wrist flexion    Wrist extension    Wrist ulnar deviation    Wrist radial deviation    Wrist pronation    Wrist supination    (Blank rows = not tested)  UPPER EXTREMITY MMT:  No MMT testing today.    SHOULDER SPECIAL TESTS: NT  JOINT MOBILITY TESTING:  No L shoulder joint mobs. Secondary to anterior dislocation.    PALPATION:  Generalized L hand tenderness/ bruising.  TREATMENT DATE: 11/21/2023  Subjective: Pt. Reports an increase in L shoulder pain/ symptoms after last PT tx. Session and addition of more ROM ex.  Pt. Ambulates into PT clinic today  with husband assist and SPC.    There.ex.:  Seated shoulder pulley:  flexion/ scaption/ abduction (issued for HEP).    Seated ball under L shoulder to increase sh. Abduction height/ added gentle L sh. Adduction isometric 10x each.    Seated L shoulder with wand (unilateral)- circles/ flexion (slowly increasing L hand height on wand)- issued for HEP.    Updated HEP (Access Code: [id number])- incorporated wand ex. (AA/PROM)- educated pt./ husband on proper technique.    There.act.:  Stepping up/down 6" step with use of SPC and no handrails to simulate entered house at garage.  Pt. Has a door frame to brace but no handrails.  Pt. Challenged with descending step but able to complete with husband assist and SPC.    Pt. Will ice L shoulder consistently at home.     PATIENT EDUCATION: Education details: Reviewed HEP Person educated: Patient and Spouse Education method: Medical illustrator Education comprehension: verbalized understanding and returned demonstration  HOME EXERCISE PROGRAM: Access Code: [email or web address] URL: [email or web address] Date: 11/06/2023 Prepared by: Dorene Grebe  Exercises - Seated Scapular Retraction  - 1 x daily - 7 x weekly - 2 sets - 10 reps - Standing Isometric Shoulder External Rotation with Doorway  - 1 x daily - 7 x weekly - 2 sets - 10 reps - 5-10sec hold - Isometric Shoulder Abduction at Wall  - 1 x daily - 7 x weekly - 2 sets - 10 reps - 5-10 sec hold - Isometric Shoulder Flexion at Wall  - 1 x daily - 7 x weekly - 2 sets - 10 reps - 5-10 sec hold   Access Code: [email or web address] URL: [email or web address] Date: 11/18/2023 Prepared by: Dorene Grebe  Exercises - Seated Scapular Retraction  - 1 x daily - 7 x weekly - 2 sets - 10 reps - Supine Shoulder External Rotation AAROM with Dowel  - 1 x daily - 7 x weekly - 3 sets - 10 reps - Supine Shoulder Flexion Overhead with Dowel  - 1 x daily - 7 x weekly - 3 sets - 10 reps -  Supine Shoulder Press with Dowel  - 1 x daily - 7 x weekly - 3 sets - 10 reps - Supine Shoulder Abduction AAROM with Dowel  - 1 x daily - 7 x weekly - 3 sets - 10 reps - Supine Shoulder Flexion AAROM with Dowel  - 1 x daily - 7 x weekly - 3 sets - 10 reps  ASSESSMENT:  CLINICAL IMPRESSION: Pt. Reports an increase in L shoulder pain and presents with moderate L shoulder joint stiffness/limitations.  Tx. Focused on L shoulder AA/PROM in pain tolerable range, avoiding extension.  Pt. Able to complete pulley ex. With B elbow extension in a tolerable ROM for flexion/ scaption. Pt. Remains limited by joint stiffness/ pain.  No active L sh. Flexion and unable to hold L sh. At >45 deg. Flexion in seated posture.   Pt. Will benefit from skilled PT services to increase L shoulder ROM/ strength to promote return to PLOF/ mobility with SPC.       OBJECTIVE IMPAIRMENTS: decreased activity tolerance, decreased endurance, decreased ROM, decreased strength, hypomobility, impaired flexibility, impaired sensation, improper body mechanics, postural dysfunction, and pain.   ACTIVITY LIMITATIONS: carrying, lifting, bathing, toileting, dressing, self feeding, reach over head,  and hygiene/grooming  PARTICIPATION LIMITATIONS: meal prep, cleaning, laundry, and shopping  PERSONAL FACTORS: Fitness and Past/current experiences are also affecting patient's functional outcome.   REHAB POTENTIAL: Good  CLINICAL DECISION MAKING: Stable/uncomplicated  EVALUATION COMPLEXITY: Moderate   GOALS: Goals reviewed with patient? Yes  SHORT TERM GOALS: Target date: 11/26/23  Pt. Independent with HEP to increase L shoulder PROM to 90 deg. Flexion/ abduction in seated posture to improve L sh. Joint capsule.   Baseline:  See above Goal status: INITIAL  2.  Pt. Able to complete HEP with no increase c/o L shoulder pain to improve L sh. Joint mobility/ stability.   Baseline:  Goal status: INITIAL  LONG TERM GOALS: Target date:  12/17/23  Pt. Will increase FOTO to 61 to improve pain-free mobility.   Baseline: initial 32 Goal status: INITIAL  2.  Pt. Will increase L shoulder AROM to >90 deg. Flexion to improve management of hair/ feeding.   Baseline: no L shoulder AROM at this time.   Goal status: INITIAL  3.  Pt. Able to ambulate with use of SPC on L with consistent recip. Gait pattern and on c/o L shoulder pain to improve household mobility.   Baseline: Pt. Unable to use L shoulder/ SPC at this time.  Goal status: INITIAL  PLAN:  PT FREQUENCY: 2x/week  PT DURATION: 6 weeks  PLANNED INTERVENTIONS: 97110-Therapeutic exercises, 97530- Therapeutic activity, [id number]- Neuromuscular re-education, 97535- Self Care, 28413- Manual therapy, Patient/Family education, Joint mobilization, DME instructions, Cryotherapy, and Moist heat  PLAN FOR NEXT SESSION: RECHECK HEP/ discuss use of pulley at home   Cammie Mcgee, PT, DPT # [phone] 11/21/2023, 1:52 PM

## 2023-11-26 ENCOUNTER — Encounter: Payer: Self-pay | Admitting: Physical Therapy

## 2023-11-26 ENCOUNTER — Ambulatory Visit: Payer: Medicare Other | Attending: Orthopedic Surgery | Admitting: Physical Therapy

## 2023-11-26 DIAGNOSIS — M6281 Muscle weakness (generalized): Secondary | ICD-10-CM

## 2023-11-26 DIAGNOSIS — M25612 Stiffness of left shoulder, not elsewhere classified: Secondary | ICD-10-CM | POA: Diagnosis present

## 2023-11-26 DIAGNOSIS — M25512 Pain in left shoulder: Secondary | ICD-10-CM

## 2023-11-26 NOTE — Therapy (Signed)
 OUTPATIENT PHYSICAL THERAPY SHOULDER TREATMENT  Patient Name: Evelyn Cisneros MRN: [id number] DOB:Jan 27, 1941, 83 y.o., female Today's Date: 11/26/2023  END OF SESSION:  PT End of Session - 11/26/23 1103     Visit Number 6    Number of Visits 12    Date for PT Re-Evaluation 12/17/23    PT Start Time 1103    PT Stop Time 1150    PT Time Calculation (min) 47 min             Past Medical History:  Diagnosis Date   Anemia    Arthritis    Heart murmur    Hypertension    Hypothyroidism    Spondylolisthesis of lumbar region    Past Surgical History:  Procedure Laterality Date   ABDOMINAL HYSTERECTOMY     REPLACEMENT TOTAL KNEE BILATERAL     SPINE SURGERY     Patient Active Problem List   Diagnosis Date Noted   Spondylolisthesis of lumbar region 05/04/2018   Abnormal glucose level 02/12/2018   Abnormal weight gain 02/12/2018   Allergic rhinitis 02/12/2018   Asthma 02/12/2018   Bradycardia 02/12/2018   Edema 02/12/2018   Heart murmur 02/12/2018   Hypercalcemia 02/12/2018   Hypokalemia 02/12/2018   Pure hypercholesterolemia 02/12/2018   Shoulder joint pain 02/12/2018   Skin sensation disturbance 02/12/2018   Syncope 02/12/2018   Vitamin D  deficiency 02/12/2018   Central cervical cord injury, 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, 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) 04/24/2017   Muscle spasticity 04/24/2017   Weight loss 12/19/2016   Hypothyroidism 11/24/2016   Bilateral leg edema 11/24/2016   Cervical radiculopathy 11/24/2016   Disc degeneration, lumbar 11/24/2016   Essential hypertension 11/24/2016   Gallstones 11/24/2016   History of anxiety state 11/24/2016   Hyperlipidemia, unspecified 11/24/2016   Spinal stenosis of lumbar region with neurogenic claudication 11/08/2016   Anxiety 06/08/2015   PCP: Evelyn Cisneros  REFERRING PROVIDER: Teresa Beryl CROME, Cisneros  REFERRING DIAG: [phone] (ICD-10-CM) - Anterior dislocation of left humerus   THERAPY DIAG:  Acute pain of left  shoulder  Post-traumatic stiffness of left shoulder joint  Muscle weakness (generalized)  Rationale for Evaluation and Treatment: Rehabilitation  ONSET DATE: 10/20/23  SUBJECTIVE:                                                                                                                                                                                      SUBJECTIVE STATEMENT: Pt. Reports falling while pushing shopping cart in parking lot at [employer].  Pt. Hit head resulting in lacerations/ stitches and L anterior shoulder dislocation.  Pt. Reports 0/10 L shoulder pain at rest and 2/10 pain with eating/brushing  teeth.  Pt. Was using SPC on L prior to fall but limited due to shoulder sling.  Pt. Entered PT in w/c today due to inability to use L UE at this time.  Hand dominance: Left  PERTINENT HISTORY: Subjective   Evelyn Cisneros is a 83 y.o. female in today for: HPI: History of Present Illness  Patient here today for suture removal. She underwent lac repair of her forehead after a fall on concrete 12/30. Repaired 2 lacs with 2 interrupted sutures and 6 running locked sutures.   Today, patient reports healing well. She has had a nodule come up on her left wrist. It is mildly tender to palpation, does not bother her otherwise.   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 90 tablet 3  atorvastatin  (LIPITOR) 40 MG tablet TAKE 1 TABLET(40 MG) BY MOUTH EVERY DAY 90 tablet 3  biotin 5 mg Tab Take by mouth  busPIRone (BUSPAR) 5 MG tablet Take 1 tablet (5 mg total) by mouth 2 (two) times daily as needed 30 tablet 5  calcium  lactate 100 mg calcium  Tab Take by mouth  cholecalciferol , vitamin D3, (VITAMIN D3) 125 mcg (5,000 unit) Tab Take 1 tablet by mouth every morning 90 tablet 3  co-enzyme Q-10, ubiquinone, 200 mg capsule Take 200 mg by mouth every morning.   cyanocobalamin (VITAMIN B12) 1,000 mcg SL tablet Take by mouth  ferrous sulfate  325 (65 FE) MG tablet Take 1 tablet (325 mg total) by mouth twice a week 90 tablet 3  FUROsemide  (LASIX ) 20 MG tablet Take 1-2 tablets (20-40 mg total) by mouth once daily as needed for Edema 60 tablet 4  gabapentin  (NEURONTIN ) 300 MG capsule TAKE ONE CAPSULE BY MOUTH THREE TIMES DAILY TO FOUR TIMES DAILY AS NEEDED FOR PAIN 360 capsule 3  Lactobacillus acidophilus (PROBIOTIC ORAL) Take by mouth once daily  levothyroxine  (SYNTHROID ) 50 MCG tablet Take 1.5 tablets (75 mcg total) by mouth once daily 135 tablet 3  lidocaine  (LIDODERM ) 5 % patch as needed  losartan  (COZAAR ) 100 MG tablet TAKE 1 TABLET(100 MG) BY MOUTH EVERY DAY 90 tablet 3  MAGNESIUM ORAL Take by mouth  VIT C/VIT E AC/LUT/COPPER/ZINC  (PRESERVISION LUTEIN ORAL) Take 1 tablet by mouth every morning. Last dose one week prior to procedure  ZINC  ORAL Take 50 mg by mouth once daily   No facility-administered medications prior to visit.    Objective   Vitals:  10/30/23 1350  PainSc: 0-No pain   There is no height or weight on file to calculate BMI. Home Vitals:  Physical Exam Constitutional:  General: She is not in acute distress. Appearance: Normal appearance.  HENT:  Head: Normocephalic and atraumatic.  Eyes:  General: No scleral icterus. Extraocular Movements: Extraocular movements intact.  Conjunctiva/sclera: Conjunctivae  normal.  Cardiovascular:  Rate and Rhythm: Normal rate and regular rhythm.  Pulmonary:  Effort: Pulmonary effort is normal.  No respiratory distress.  Breath sounds: Normal breath sounds.  Musculoskeletal:  General: Normal range of motion.  Cervical back: Normal range of motion and neck supple.  Comments: Head lacerations with good interval healing. All sutures visualized for removal.   Left hand with significant bruising from PIPs to upper wrist, interval resolution present. There is a cystic lesion, nonmobile, mildly tender to manipulation present in her posterior central wrist.  Skin: General: Skin is warm and dry.  Coloration: Skin is not jaundiced.  Findings: No erythema or rash.  Neurological:  General: No focal deficit present.  Mental Status: She is alert.  Cranial Nerves: No cranial nerve deficit.  Coordination: Coordination normal.   Assessment & Plan  Diagnoses and all orders for this visit:  Visit for suture removal - Running central scalp suture and 2x interrupted suture of right scalp removed without difficulty. Area clean with good interval healing to wound.  - Recommend gentle washing with soap and water. Avoid aggressive scrubbing. Keep area clean and dry. Reviewed warning s/s for wound healing or new onset infection requiring further evaluation.  Wrist lesion - Possible ganglion cyst vs healing blister pocket, difficult to evaluate fully with overall bruising and injury from her recent fall. Reviewed possible diagnoses. If pain, worsening swelling, numbness/tingling or movement limitations recommend earlier evaluation. Otherwise recommend evaluation by orthopedics at her follow up if symptoms do not resolve with the rest of her injuries.   PAIN:  Are you having pain? Yes: NPRS scale: 2/10 Pain location: L shoulder Pain description: sharp/aching Aggravating factors: movement/ brushing teeth Relieving factors: rest/ use of sling  PRECAUTIONS: Shoulder  RED  FLAGS: None   WEIGHT BEARING RESTRICTIONS: No  FALLS:  Has patient fallen in last 6 months? Yes. Number of falls 2  LIVING ENVIRONMENT: Lives with: lives with their spouse Lives in: House/apartment Has following equipment at home: Single point cane and Wheelchair (manual)  OCCUPATION: Retired  PLOF: Independent with household mobility with device  PATIENT GOALS:  Increase L shoulder AROM/ strength/ pain-free mobility.    NEXT Cisneros VISIT: 11/14/23 with Dr. Gini (ortho Cisneros)  OBJECTIVE:  Note: Objective measures were completed at Evaluation unless otherwise noted.  DIAGNOSTIC FINDINGS:  See imaging  PATIENT SURVEYS:  FOTO initial 32/ goal 26   COGNITION: Overall cognitive status: Within functional limits for tasks assessed     SENSATION: WFL.  Moderate L hand bruising.    POSTURE: Rounded shoulders/ forward posture.  Use of L shoulder sling.    UPPER EXTREMITY ROM:  Full tear of R supraspinatus reported.    Passive ROM Right Eval AROM Left Eval PROM  Shoulder flexion 74 deg. 53 deg.  Shoulder extension    Shoulder abduction 70 deg. 42 deg.  Shoulder adduction    Shoulder internal rotation  [facility]  Shoulder external rotation  0 deg.  Elbow flexion [facility] WFL  Elbow extension [facility] WFL  Wrist flexion    Wrist extension    Wrist ulnar deviation    Wrist radial deviation    Wrist pronation    Wrist supination    (Blank rows = not tested)  UPPER EXTREMITY MMT:  No MMT testing today.    SHOULDER SPECIAL TESTS: NT  JOINT MOBILITY TESTING:  No L shoulder joint mobs. Secondary to anterior dislocation.    PALPATION:  Generalized L hand tenderness/ bruising.  TREATMENT DATE: 11/26/2023  Subjective: Pt. Reports an increase in L shoulder pain/ symptoms with use of SPC on L and addition of more ROM ex.  Pt. Ambulates into PT clinic today with  husband assist and SPC.  Pt. States she has been able to step up/down at stair in garage with husband assist safely.    There.ex.:  Seated shoulder pulley:  flexion/ scaption/ abduction (issued for HEP).    Seated L shoulder with wand (unilateral)- circles/ flexion (slowly increasing L hand height on wand)- issued for HEP.    Seated ball under L shoulder to increase sh. Abduction height/ added gentle L sh. Adduction isometric 10x each.    Updated HEP (Access Code: [id number])- incorporated wand ex. (AA/PROM)- educated pt./ husband on proper technique.  Issued YTB for bicep curls.  Pt. May also use 1# dumbbells at home.    There.act.:  Sit to stands with L UE assist on arm rests.  Walking in clinic with use of SPC on L and more consistent recip. Gait pattern.  Slight increase in L anterior shoulder discomfort with pressure through SPC.    Pt. Will ice L shoulder consistently at home.     PATIENT EDUCATION: Education details: Reviewed HEP Person educated: Patient and Spouse Education method: Medical Illustrator Education comprehension: verbalized understanding and returned demonstration  HOME EXERCISE PROGRAM: Access Code: [email or web address] URL: [email or web address] Date: 11/06/2023 Prepared by: Ozell Sero  Exercises - Seated Scapular Retraction  - 1 x daily - 7 x weekly - 2 sets - 10 reps - Standing Isometric Shoulder External Rotation with Doorway  - 1 x daily - 7 x weekly - 2 sets - 10 reps - 5-10sec hold - Isometric Shoulder Abduction at Wall  - 1 x daily - 7 x weekly - 2 sets - 10 reps - 5-10 sec hold - Isometric Shoulder Flexion at Wall  - 1 x daily - 7 x weekly - 2 sets - 10 reps - 5-10 sec hold   Access Code: [email or web address] URL: [email or web address] Date: 11/18/2023 Prepared by: Ozell Sero  Exercises - Seated Scapular Retraction  - 1 x daily - 7 x weekly - 2 sets - 10 reps - Supine Shoulder External Rotation AAROM with Dowel  - 1 x daily  - 7 x weekly - 3 sets - 10 reps - Supine Shoulder Flexion Overhead with Dowel  - 1 x daily - 7 x weekly - 3 sets - 10 reps - Supine Shoulder Press with Dowel  - 1 x daily - 7 x weekly - 3 sets - 10 reps - Supine Shoulder Abduction AAROM with Dowel  - 1 x daily - 7 x weekly - 3 sets - 10 reps - Supine Shoulder Flexion AAROM with Dowel  - 1 x daily - 7 x weekly - 3 sets - 10 reps  ASSESSMENT:  CLINICAL IMPRESSION: Pt. Reports an increase in L shoulder pain and presents with moderate L shoulder joint stiffness/limitations.  Tx. Focused on L shoulder AA/PROM in pain tolerable range, avoiding extension.  Pt. Remains limited by joint stiffness/ pain.  No active L sh. Flexion and unable to hold L sh. At >45 deg. Flexion in seated posture.   Pt. Will benefit from skilled PT services to increase L shoulder ROM/ strength to promote return to PLOF/ mobility with SPC.       OBJECTIVE IMPAIRMENTS: decreased activity tolerance, decreased endurance, decreased ROM, decreased strength, hypomobility, impaired flexibility, impaired sensation, improper body mechanics, postural dysfunction, and pain.  ACTIVITY LIMITATIONS: carrying, lifting, bathing, toileting, dressing, self feeding, reach over head, and hygiene/grooming  PARTICIPATION LIMITATIONS: meal prep, cleaning, laundry, and shopping  PERSONAL FACTORS: Fitness and Past/current experiences are also affecting patient's functional outcome.   REHAB POTENTIAL: Good  CLINICAL DECISION MAKING: Stable/uncomplicated  EVALUATION COMPLEXITY: Moderate   GOALS: Goals reviewed with patient? Yes  SHORT TERM GOALS: Target date: 11/26/23  Pt. Independent with HEP to increase L shoulder PROM to 90 deg. Flexion/ abduction in seated posture to improve L sh. Joint capsule.   Baseline:  See above Goal status: Goal met  2.  Pt. Able to complete HEP with no increase c/o L shoulder pain to improve L sh. Joint mobility/ stability.   Baseline:  Goal status: Partially  met  LONG TERM GOALS: Target date: 12/17/23  Pt. Will increase FOTO to 61 to improve pain-free mobility.   Baseline: initial 32 Goal status: INITIAL  2.  Pt. Will increase L shoulder AROM to >90 deg. Flexion to improve management of hair/ feeding.   Baseline: no L shoulder AROM at this time.   Goal status: INITIAL  3.  Pt. Able to ambulate with use of SPC on L with consistent recip. Gait pattern and on c/o L shoulder pain to improve household mobility.   Baseline: Pt. Unable to use L shoulder/ SPC at this time.  Goal status: INITIAL  PLAN:  PT FREQUENCY: 2x/week  PT DURATION: 6 weeks  PLANNED INTERVENTIONS: 97110-Therapeutic exercises, 97530- Therapeutic activity, [id number]- Neuromuscular re-education, 97535- Self Care, [id number]- Manual therapy, Patient/Family education, Joint mobilization, DME instructions, Cryotherapy, and Moist heat  PLAN FOR NEXT SESSION: RECHECK HEP   Ozell JAYSON Sero, PT, DPT # [phone] 11/26/2023, 6:55 PM

## 2023-12-02 ENCOUNTER — Ambulatory Visit: Payer: Medicare Other | Admitting: Physical Therapy

## 2023-12-02 ENCOUNTER — Encounter: Payer: Self-pay | Admitting: Physical Therapy

## 2023-12-02 DIAGNOSIS — M6281 Muscle weakness (generalized): Secondary | ICD-10-CM

## 2023-12-02 DIAGNOSIS — M25612 Stiffness of left shoulder, not elsewhere classified: Secondary | ICD-10-CM

## 2023-12-02 DIAGNOSIS — M25512 Pain in left shoulder: Secondary | ICD-10-CM | POA: Diagnosis not present

## 2023-12-02 NOTE — Therapy (Signed)
OUTPATIENT PHYSICAL THERAPY SHOULDER TREATMENT  Patient Name: Evelyn Cisneros MRN: [id number] DOB:1941/04/30, 83 y.o., female Today's Date: 12/02/2023  END OF SESSION:  PT End of Session - 12/02/23 1111     Visit Number 7    Number of Visits 12    Date for PT Re-Evaluation 12/17/23    PT Start Time 1111    PT Stop Time 1200    PT Time Calculation (min) 49 min             Past Medical History:  Diagnosis Date   Anemia    Arthritis    Heart murmur    Hypertension    Hypothyroidism    Spondylolisthesis of lumbar region    Past Surgical History:  Procedure Laterality Date   ABDOMINAL HYSTERECTOMY     REPLACEMENT TOTAL KNEE BILATERAL     SPINE SURGERY     Patient Active Problem List   Diagnosis Date Noted   Spondylolisthesis of lumbar region 05/04/2018   Abnormal glucose level 02/12/2018   Abnormal weight gain 02/12/2018   Allergic rhinitis 02/12/2018   Asthma 02/12/2018   Bradycardia 02/12/2018   Edema 02/12/2018   Heart murmur 02/12/2018   Hypercalcemia 02/12/2018   Hypokalemia 02/12/2018   Pure hypercholesterolemia 02/12/2018   Shoulder joint pain 02/12/2018   Skin sensation disturbance 02/12/2018   Syncope 02/12/2018   Vitamin D deficiency 02/12/2018   Central cervical cord injury, 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, 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) 04/24/2017   Muscle spasticity 04/24/2017   Weight loss 12/19/2016   Hypothyroidism 11/24/2016   Bilateral leg edema 11/24/2016   Cervical radiculopathy 11/24/2016   Disc degeneration, lumbar 11/24/2016   Essential hypertension 11/24/2016   Gallstones 11/24/2016   History of anxiety state 11/24/2016   Hyperlipidemia, unspecified 11/24/2016   Spinal stenosis of lumbar region with neurogenic claudication 11/08/2016   Anxiety 06/08/2015   PCP: Verner Mould, MD  REFERRING PROVIDER: Michell Heinrich, PA-C  REFERRING DIAG: [phone] (ICD-10-CM) - Anterior dislocation of left humerus   THERAPY DIAG:  Acute pain of left  shoulder  Post-traumatic stiffness of left shoulder joint  Muscle weakness (generalized)  Rationale for Evaluation and Treatment: Rehabilitation  ONSET DATE: 10/20/23  SUBJECTIVE:                                                                                                                                                                                      SUBJECTIVE STATEMENT: Pt. Reports falling while pushing shopping cart in parking lot at [place].  Pt. Hit head resulting in lacerations/ stitches and L anterior shoulder dislocation.  Pt. Reports 0/10 L shoulder pain at rest and 2/10 pain with eating/brushing  teeth.  Pt. Was using SPC on L prior to fall but limited due to shoulder sling.  Pt. Entered PT in w/c today due to inability to use L UE at this time.  Hand dominance: Left  PERTINENT HISTORY: Subjective   Evelyn Cisneros is a 83 y.o. female in today for: HPI: History of Present Illness  Patient here today for suture removal. She underwent lac repair of her forehead after a fall on concrete 12/30. Repaired 2 lacs with 2 interrupted sutures and 6 running locked sutures.   Today, patient reports healing well. She has had a nodule come up on her left wrist. It is mildly tender to palpation, does not bother her otherwise.   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 90 tablet 3  atorvastatin (LIPITOR) 40 MG tablet TAKE 1 TABLET(40 MG) BY MOUTH EVERY DAY 90 tablet 3  biotin 5 mg Tab Take by mouth  busPIRone (BUSPAR) 5 MG tablet Take 1 tablet (5 mg total) by mouth 2 (two) times daily as needed 30 tablet 5  calcium lactate 100 mg calcium Tab Take by mouth  cholecalciferol, vitamin D3, (VITAMIN D3) 125 mcg (5,000 unit) Tab Take 1 tablet by mouth every morning 90 tablet 3  co-enzyme Q-10, ubiquinone, 200 mg capsule Take 200 mg by mouth every morning.   cyanocobalamin (VITAMIN B12) 1,000 mcg SL tablet Take by mouth  ferrous sulfate 325 (65 FE) MG tablet Take 1 tablet (325 mg total) by mouth twice a week 90 tablet 3  FUROsemide (LASIX) 20 MG tablet Take 1-2 tablets (20-40 mg total) by mouth once daily as needed for Edema 60 tablet 4  gabapentin (NEURONTIN) 300 MG capsule TAKE ONE CAPSULE BY MOUTH THREE TIMES DAILY TO FOUR TIMES DAILY AS NEEDED FOR PAIN 360 capsule 3  Lactobacillus acidophilus (PROBIOTIC ORAL) Take by mouth once daily  levothyroxine (SYNTHROID) 50 MCG tablet Take 1.5 tablets (75 mcg total) by mouth once daily 135 tablet 3  lidocaine (LIDODERM) 5 % patch as needed  losartan (COZAAR) 100 MG tablet TAKE 1 TABLET(100 MG) BY MOUTH EVERY DAY 90 tablet 3  MAGNESIUM ORAL Take by mouth  VIT C/VIT E AC/LUT/COPPER/ZINC (PRESERVISION LUTEIN ORAL) Take 1 tablet by mouth every morning. Last dose one week prior to procedure  ZINC ORAL Take 50 mg by mouth once daily   No facility-administered medications prior to visit.    Objective   Vitals:  10/30/23 1350  PainSc: 0-No pain   There is no height or weight on file to calculate BMI. Home Vitals:  Physical Exam Constitutional:  General: She is not in acute distress. Appearance: Normal appearance.  HENT:  Head: Normocephalic and atraumatic.  Eyes:  General: No scleral icterus. Extraocular Movements: Extraocular movements intact.  Conjunctiva/sclera: Conjunctivae  normal.  Cardiovascular:  Rate and Rhythm: Normal rate and regular rhythm.  Pulmonary:  Effort: Pulmonary effort is normal.  No respiratory distress.  Breath sounds: Normal breath sounds.  Musculoskeletal:  General: Normal range of motion.  Cervical back: Normal range of motion and neck supple.  Comments: Head lacerations with good interval healing. All sutures visualized for removal.   Left hand with significant bruising from PIPs to upper wrist, interval resolution present. There is a cystic lesion, nonmobile, mildly tender to manipulation present in her posterior central wrist.  Skin: General: Skin is warm and dry.  Coloration: Skin is not jaundiced.  Findings: No erythema or rash.  Neurological:  General: No focal deficit present.  Mental Status: She is alert.  Cranial Nerves: No cranial nerve deficit.  Coordination: Coordination normal.   Assessment & Plan  Diagnoses and all orders for this visit:  Visit for suture removal - Running central scalp suture and 2x interrupted suture of right scalp removed without difficulty. Area clean with good interval healing to wound.  - Recommend gentle washing with soap and water. Avoid aggressive scrubbing. Keep area clean and dry. Reviewed warning s/s for wound healing or new onset infection requiring further evaluation.  Wrist lesion - Possible ganglion cyst vs healing blister pocket, difficult to evaluate fully with overall bruising and injury from her recent fall. Reviewed possible diagnoses. If pain, worsening swelling, numbness/tingling or movement limitations recommend earlier evaluation. Otherwise recommend evaluation by orthopedics at her follow up if symptoms do not resolve with the rest of her injuries.   PAIN:  Are you having pain? Yes: NPRS scale: 2/10 Pain location: L shoulder Pain description: sharp/aching Aggravating factors: movement/ brushing teeth Relieving factors: rest/ use of sling  PRECAUTIONS: Shoulder  RED  FLAGS: None   WEIGHT BEARING RESTRICTIONS: No  FALLS:  Has patient fallen in last 6 months? Yes. Number of falls 2  LIVING ENVIRONMENT: Lives with: lives with their spouse Lives in: House/apartment Has following equipment at home: Single point cane and Wheelchair (manual)  OCCUPATION: Retired  PLOF: Independent with household mobility with device  PATIENT GOALS:  Increase L shoulder AROM/ strength/ pain-free mobility.    NEXT MD VISIT: 11/14/23 with Dr. Franco Collet (ortho MD)  OBJECTIVE:  Note: Objective measures were completed at Evaluation unless otherwise noted.  DIAGNOSTIC FINDINGS:  See imaging  PATIENT SURVEYS:  FOTO initial 32/ goal 61   COGNITION: Overall cognitive status: Within functional limits for tasks assessed     SENSATION: WFL.  Moderate L hand bruising.    POSTURE: Rounded shoulders/ forward posture.  Use of L shoulder sling.    UPPER EXTREMITY ROM:  Full tear of R supraspinatus reported.    Passive ROM Right Eval AROM Left Eval PROM  Shoulder flexion 74 deg. 53 deg.  Shoulder extension    Shoulder abduction 70 deg. 42 deg.  Shoulder adduction    Shoulder internal rotation  [facility]  Shoulder external rotation  0 deg.  Elbow flexion [facility] WFL  Elbow extension [facility] WFL  Wrist flexion    Wrist extension    Wrist ulnar deviation    Wrist radial deviation    Wrist pronation    Wrist supination    (Blank rows = not tested)  UPPER EXTREMITY MMT:  No MMT testing today.    SHOULDER SPECIAL TESTS: NT  JOINT MOBILITY TESTING:  No L shoulder joint mobs. Secondary to anterior dislocation.    PALPATION:  Generalized L hand tenderness/ bruising.  TREATMENT DATE: 12/02/2023  Subjective: Pt. Reports no new complaints.  Pt. Only missed 1 day of HEP due to home guests.  Pt. Entered PT with use of SPC and SBA for safety.  Pt. Has been  ascending/descending step at home with no issues.    There.ex.:  Seated shoulder pulley:  flexion/ scaption/ abduction (reviewed for HEP and instructed to slow down/ control movements).    See updated HEP (below)- YTB overhead at door for shoulder extension/ tricep extension.    There.act.:  Standing table top shoulder flexion with bolster (elevating table height).  Use of Blaze pods for reaching on table top.    Pt. Will ice L shoulder at home as needed.   PATIENT EDUCATION: Education details: Reviewed HEP Person educated: Patient and Spouse Education method: Medical illustrator Education comprehension: verbalized understanding and returned demonstration  HOME EXERCISE PROGRAM: Access Code: [email or web address] URL: [email or web address] Date: 11/06/2023 Prepared by: Dorene Grebe  Exercises - Seated Scapular Retraction  - 1 x daily - 7 x weekly - 2 sets - 10 reps - Standing Isometric Shoulder External Rotation with Doorway  - 1 x daily - 7 x weekly - 2 sets - 10 reps - 5-10sec hold - Isometric Shoulder Abduction at Wall  - 1 x daily - 7 x weekly - 2 sets - 10 reps - 5-10 sec hold - Isometric Shoulder Flexion at Wall  - 1 x daily - 7 x weekly - 2 sets - 10 reps - 5-10 sec hold   Access Code: [email or web address] URL: [email or web address] Date: 11/18/2023 Prepared by: Dorene Grebe  Exercises - Seated Scapular Retraction  - 1 x daily - 7 x weekly - 2 sets - 10 reps - Supine Shoulder External Rotation AAROM with Dowel  - 1 x daily - 7 x weekly - 3 sets - 10 reps - Supine Shoulder Flexion Overhead with Dowel  - 1 x daily - 7 x weekly - 3 sets - 10 reps - Supine Shoulder Press with Dowel  - 1 x daily - 7 x weekly - 3 sets - 10 reps - Supine Shoulder Abduction AAROM with Dowel  - 1 x daily - 7 x weekly - 3 sets - 10 reps - Supine Shoulder Flexion AAROM with Dowel  - 1 x daily - 7 x weekly - 3 sets - 10 reps  Access Code: [email or web address] URL:  [email or web address] Date: 12/02/2023 Prepared by: Dorene Grebe  Exercises - Supine Shoulder External Rotation AAROM with Dowel  - 1 x daily - 5 x weekly - 3 sets - 10 reps - Supine Shoulder Flexion Overhead with Dowel  - 1 x daily - 5 x weekly - 3 sets - 10 reps - Supine Shoulder Press with Dowel  - 1 x daily - 5 x weekly - 3 sets - 10 reps - Supine Shoulder Abduction AAROM with Dowel  - 1 x daily - 5 x weekly - 3 sets - 10 reps - Supine Shoulder Flexion AAROM with Dowel  - 1 x daily - 5 x weekly - 3 sets - 10 reps - Shoulder extension with resistance - Neutral  - 1 x daily - 5 x weekly - 3 sets - 10 reps - Standing Tricep Extensions with Resistance  - 1 x daily - 5 x weekly - 3 sets - 10 reps - Standing Single Arm Bicep Curls Supinated with Dumbbell  - 1 x daily - 5 x weekly - 3 sets - 10 reps  ASSESSMENT:  CLINICAL IMPRESSION: Tx.  Focused on L shoulder AA/PROM in pain tolerable range, avoiding extension.  Pt. Remains limited by joint stiffness/ pain.  Pt. Works hard during table top treatments focus on L shoulder reaching.  No active L sh. Flexion and unable to hold L sh. At >45 deg. Flexion in seated posture.   Pt. Will benefit from skilled PT services to increase L shoulder ROM/ strength to promote return to PLOF/ mobility with SPC.       OBJECTIVE IMPAIRMENTS: decreased activity tolerance, decreased endurance, decreased ROM, decreased strength, hypomobility, impaired flexibility, impaired sensation, improper body mechanics, postural dysfunction, and pain.   ACTIVITY LIMITATIONS: carrying, lifting, bathing, toileting, dressing, self feeding, reach over head, and hygiene/grooming  PARTICIPATION LIMITATIONS: meal prep, cleaning, laundry, and shopping  PERSONAL FACTORS: Fitness and Past/current experiences are also affecting patient's functional outcome.   REHAB POTENTIAL: Good  CLINICAL DECISION MAKING: Stable/uncomplicated  EVALUATION COMPLEXITY:  Moderate   GOALS: Goals reviewed with patient? Yes  SHORT TERM GOALS: Target date: 11/26/23  Pt. Independent with HEP to increase L shoulder PROM to 90 deg. Flexion/ abduction in seated posture to improve L sh. Joint capsule.   Baseline:  See above Goal status: Goal met  2.  Pt. Able to complete HEP with no increase c/o L shoulder pain to improve L sh. Joint mobility/ stability.   Baseline:  Goal status: Partially met  LONG TERM GOALS: Target date: 12/17/23  Pt. Will increase FOTO to 61 to improve pain-free mobility.   Baseline: initial 32 Goal status: INITIAL  2.  Pt. Will increase L shoulder AROM to >90 deg. Flexion to improve management of hair/ feeding.   Baseline: no L shoulder AROM at this time.   Goal status: INITIAL  3.  Pt. Able to ambulate with use of SPC on L with consistent recip. Gait pattern and on c/o L shoulder pain to improve household mobility.   Baseline: Pt. Unable to use L shoulder/ SPC at this time.  Goal status: INITIAL  PLAN:  PT FREQUENCY: 2x/week  PT DURATION: 6 weeks  PLANNED INTERVENTIONS: 97110-Therapeutic exercises, 97530- Therapeutic activity, [id number]- Neuromuscular re-education, 97535- Self Care, 16109- Manual therapy, Patient/Family education, Joint mobilization, DME instructions, Cryotherapy, and Moist heat  PLAN FOR NEXT SESSION: Functional UE tasks.   Cammie Mcgee, PT, DPT # [phone] 12/02/2023, 2:58 PM

## 2023-12-10 ENCOUNTER — Ambulatory Visit: Payer: Medicare Other | Admitting: Physical Therapy

## 2023-12-10 DIAGNOSIS — M25612 Stiffness of left shoulder, not elsewhere classified: Secondary | ICD-10-CM

## 2023-12-10 DIAGNOSIS — M25512 Pain in left shoulder: Secondary | ICD-10-CM

## 2023-12-10 DIAGNOSIS — M6281 Muscle weakness (generalized): Secondary | ICD-10-CM

## 2023-12-10 NOTE — Therapy (Signed)
OUTPATIENT PHYSICAL THERAPY SHOULDER TREATMENT  Patient Name: Evelyn Cisneros MRN: [id number] DOB:Sep 16, 1941, 83 y.o., female Today's Date: 12/10/2023  END OF SESSION:  PT End of Session - 12/10/23 1037     Visit Number 8    Number of Visits 12    Date for PT Re-Evaluation 12/17/23    PT Start Time 1006    PT Stop Time 1057    PT Time Calculation (min) 51 min    Behavior During Therapy WFL for tasks assessed/performed             Past Medical History:  Diagnosis Date   Anemia    Arthritis    Heart murmur    Hypertension    Hypothyroidism    Spondylolisthesis of lumbar region    Past Surgical History:  Procedure Laterality Date   ABDOMINAL HYSTERECTOMY     REPLACEMENT TOTAL KNEE BILATERAL     SPINE SURGERY     Patient Active Problem List   Diagnosis Date Noted   Spondylolisthesis of lumbar region 05/04/2018   Abnormal glucose level 02/12/2018   Abnormal weight gain 02/12/2018   Allergic rhinitis 02/12/2018   Asthma 02/12/2018   Bradycardia 02/12/2018   Edema 02/12/2018   Heart murmur 02/12/2018   Hypercalcemia 02/12/2018   Hypokalemia 02/12/2018   Pure hypercholesterolemia 02/12/2018   Shoulder joint pain 02/12/2018   Skin sensation disturbance 02/12/2018   Syncope 02/12/2018   Vitamin D deficiency 02/12/2018   Central cervical cord injury, 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, 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) 04/24/2017   Muscle spasticity 04/24/2017   Weight loss 12/19/2016   Hypothyroidism 11/24/2016   Bilateral leg edema 11/24/2016   Cervical radiculopathy 11/24/2016   Disc degeneration, lumbar 11/24/2016   Essential hypertension 11/24/2016   Gallstones 11/24/2016   History of anxiety state 11/24/2016   Hyperlipidemia, unspecified 11/24/2016   Spinal stenosis of lumbar region with neurogenic claudication 11/08/2016   Anxiety 06/08/2015   PCP: Verner Mould, MD  REFERRING PROVIDER: Michell Heinrich, PA-C  REFERRING DIAG: [id number] (ICD-10-CM) - Anterior dislocation of left  humerus   THERAPY DIAG:  Post-traumatic stiffness of left shoulder joint  Acute pain of left shoulder  Muscle weakness (generalized)  Rationale for Evaluation and Treatment: Rehabilitation  ONSET DATE: 10/20/23  SUBJECTIVE:                                                                                                                                                                                      SUBJECTIVE STATEMENT: Pt. Reports falling while pushing shopping cart in parking lot at [facility].  Pt. Hit head resulting in lacerations/ stitches and L anterior shoulder dislocation.  Pt. Reports 0/10  L shoulder pain at rest and 2/10 pain with eating/brushing teeth.  Pt. Was using SPC on L prior to fall but limited due to shoulder sling.  Pt. Entered PT in w/c today due to inability to use L UE at this time.  Hand dominance: Left  PERTINENT HISTORY: Subjective   Evelyn Cisneros is a 83 y.o. female in today for: HPI: History of Present Illness  Patient here today for suture removal. She underwent lac repair of her forehead after a fall on concrete 12/30. Repaired 2 lacs with 2 interrupted sutures and 6 running locked sutures.   Today, patient reports healing well. She has had a nodule come up on her left wrist. It is mildly tender to palpation, does not bother her otherwise.   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 90 tablet 3  atorvastatin (LIPITOR) 40 MG tablet TAKE 1 TABLET(40 MG) BY MOUTH EVERY DAY 90 tablet 3  biotin 5 mg Tab Take by mouth  busPIRone (BUSPAR) 5 MG tablet Take 1 tablet (5 mg total) by mouth 2 (two) times daily as needed 30 tablet 5  calcium lactate 100 mg calcium Tab Take by mouth  cholecalciferol, vitamin D3, (VITAMIN D3) 125 mcg (5,000 unit) Tab Take 1 tablet by mouth every morning 90 tablet 3  co-enzyme Q-10, ubiquinone, 200 mg capsule Take 200 mg by mouth every morning.   cyanocobalamin (VITAMIN B12) 1,000 mcg SL tablet Take by mouth  ferrous sulfate 325 (65 FE) MG tablet Take 1 tablet (325 mg total) by mouth twice a week 90 tablet 3  FUROsemide (LASIX) 20 MG tablet Take 1-2 tablets (20-40 mg total) by mouth once daily as needed for Edema 60 tablet 4  gabapentin (NEURONTIN) 300 MG capsule TAKE ONE CAPSULE BY MOUTH THREE TIMES DAILY TO FOUR TIMES DAILY AS NEEDED FOR PAIN 360 capsule 3  Lactobacillus acidophilus (PROBIOTIC ORAL) Take by mouth once daily  levothyroxine (SYNTHROID) 50 MCG tablet Take 1.5 tablets (75 mcg total) by mouth once daily 135 tablet 3  lidocaine (LIDODERM) 5 % patch as needed  losartan (COZAAR) 100 MG tablet TAKE 1 TABLET(100 MG) BY MOUTH EVERY DAY 90 tablet 3  MAGNESIUM ORAL Take by mouth  VIT C/VIT E AC/LUT/COPPER/ZINC (PRESERVISION LUTEIN ORAL) Take 1 tablet by mouth every morning. Last dose one week prior to procedure  ZINC ORAL Take 50 mg by mouth once daily   No facility-administered medications prior to visit.    Objective   Vitals:  10/30/23 1350  PainSc: 0-No pain   There is no height or weight on file to calculate BMI. Home Vitals:  Physical Exam Constitutional:  General: She is not in acute distress. Appearance: Normal appearance.  HENT:  Head: Normocephalic and atraumatic.  Eyes:  General: No scleral icterus. Extraocular Movements: Extraocular  movements intact.  Conjunctiva/sclera: Conjunctivae normal.  Cardiovascular:  Rate and Rhythm: Normal rate and  regular rhythm.  Pulmonary:  Effort: Pulmonary effort is normal. No respiratory distress.  Breath sounds: Normal breath sounds.  Musculoskeletal:  General: Normal range of motion.  Cervical back: Normal range of motion and neck supple.  Comments: Head lacerations with good interval healing. All sutures visualized for removal.   Left hand with significant bruising from PIPs to upper wrist, interval resolution present. There is a cystic lesion, nonmobile, mildly tender to manipulation present in her posterior central wrist.  Skin: General: Skin is warm and dry.  Coloration: Skin is not jaundiced.  Findings: No erythema or rash.  Neurological:  General: No focal deficit present.  Mental Status: She is alert.  Cranial Nerves: No cranial nerve deficit.  Coordination: Coordination normal.   Assessment & Plan  Diagnoses and all orders for this visit:  Visit for suture removal - Running central scalp suture and 2x interrupted suture of right scalp removed without difficulty. Area clean with good interval healing to wound.  - Recommend gentle washing with soap and water. Avoid aggressive scrubbing. Keep area clean and dry. Reviewed warning s/s for wound healing or new onset infection requiring further evaluation.  Wrist lesion - Possible ganglion cyst vs healing blister pocket, difficult to evaluate fully with overall bruising and injury from her recent fall. Reviewed possible diagnoses. If pain, worsening swelling, numbness/tingling or movement limitations recommend earlier evaluation. Otherwise recommend evaluation by orthopedics at her follow up if symptoms do not resolve with the rest of her injuries.   PAIN:  Are you having pain? Yes: NPRS scale: 2/10 Pain location: L shoulder Pain description: sharp/aching Aggravating factors: movement/ brushing teeth Relieving factors:  rest/ use of sling  PRECAUTIONS: Shoulder  RED FLAGS: None   WEIGHT BEARING RESTRICTIONS: No  FALLS:  Has patient fallen in last 6 months? Yes. Number of falls 2  LIVING ENVIRONMENT: Lives with: lives with their spouse Lives in: House/apartment Has following equipment at home: Single point cane and Wheelchair (manual)  OCCUPATION: Retired  PLOF: Independent with household mobility with device  PATIENT GOALS:  Increase L shoulder AROM/ strength/ pain-free mobility.    NEXT MD VISIT: 11/14/23 with Dr. Franco Collet (ortho MD)  OBJECTIVE:  Note: Objective measures were completed at Evaluation unless otherwise noted.  DIAGNOSTIC FINDINGS:  See imaging  PATIENT SURVEYS:  FOTO initial 32/ goal 16   COGNITION: Overall cognitive status: Within functional limits for tasks assessed     SENSATION: WFL.  Moderate L hand bruising.    POSTURE: Rounded shoulders/ forward posture.  Use of L shoulder sling.    UPPER EXTREMITY ROM:  Full tear of R supraspinatus reported.    Passive ROM Right Eval AROM Left Eval PROM  Shoulder flexion 74 deg. 53 deg.  Shoulder extension    Shoulder abduction 70 deg. 42 deg.  Shoulder adduction    Shoulder internal rotation  [facility]  Shoulder external rotation  0 deg.  Elbow flexion [facility] WFL  Elbow extension [facility] WFL  Wrist flexion    Wrist extension    Wrist ulnar deviation    Wrist radial deviation    Wrist pronation    Wrist supination    (Blank rows = not tested)  UPPER EXTREMITY MMT:  No MMT testing today.    SHOULDER SPECIAL TESTS: NT  JOINT MOBILITY TESTING:  No L shoulder joint mobs. Secondary to anterior dislocation.    PALPATION:  Generalized L hand tenderness/ bruising.  TREATMENT DATE: 12/10/2023  Subjective: Pt. Reports no new complaints.  Pt. Notes some improvement with AROM when performing basic  household tasks such as turning on the sink. Pt. Demonstrates that they struggle to raise their L arm above 45 degrees actively, but can do so by assisting with their right.   There.ex.:  Isometric shoulder adduction/IR with ball under elbow, seated 3x1 minute B.  Seated shoulder pulley:  flexion/ scaption/ abduction (reviewed for HEP and instructed to slow down/ control movements).    Dowel IR/ER AAROM 3x1 minute B.     There.act.:  Nautilus: AAROM/min. Assist with all movements.  Seated lat pull down 20# 2x15  Seated mid row 20# 2x15 Seated single arm neutral row 20# 2x15    PATIENT EDUCATION: Education details: Reviewed HEP Person educated: Patient and Spouse Education method: Explanation and Demonstration Education comprehension: verbalized understanding and returned demonstration  HOME EXERCISE PROGRAM: Access Code: [email or web address] URL: [email or web address] Date: 11/06/2023 Prepared by: Dorene Grebe  Exercises - Seated Scapular Retraction  - 1 x daily - 7 x weekly - 2 sets - 10 reps - Standing Isometric Shoulder External Rotation with Doorway  - 1 x daily - 7 x weekly - 2 sets - 10 reps - 5-10sec hold - Isometric Shoulder Abduction at Wall  - 1 x daily - 7 x weekly - 2 sets - 10 reps - 5-10 sec hold - Isometric Shoulder Flexion at Wall  - 1 x daily - 7 x weekly - 2 sets - 10 reps - 5-10 sec hold   Access Code: [email or web address] URL: [email or web address] Date: 11/18/2023 Prepared by: Dorene Grebe  Exercises - Seated Scapular Retraction  - 1 x daily - 7 x weekly - 2 sets - 10 reps - Supine Shoulder External Rotation AAROM with Dowel  - 1 x daily - 7 x weekly - 3 sets - 10 reps - Supine Shoulder Flexion Overhead with Dowel  - 1 x daily - 7 x weekly - 3 sets - 10 reps - Supine Shoulder Press with Dowel  - 1 x daily - 7 x weekly - 3 sets - 10 reps - Supine Shoulder Abduction AAROM with Dowel  - 1 x daily - 7 x weekly - 3 sets - 10 reps - Supine  Shoulder Flexion AAROM with Dowel  - 1 x daily - 7 x weekly - 3 sets - 10 reps  Access Code: [email or web address] URL: [email or web address] Date: 12/02/2023 Prepared by: Dorene Grebe  Exercises - Supine Shoulder External Rotation AAROM with Dowel  - 1 x daily - 5 x weekly - 3 sets - 10 reps - Supine Shoulder Flexion Overhead with Dowel  - 1 x daily - 5 x weekly - 3 sets - 10 reps - Supine Shoulder Press with Dowel  - 1 x daily - 5 x weekly - 3 sets - 10 reps - Supine Shoulder Abduction AAROM with Dowel  - 1 x daily - 5 x weekly - 3 sets - 10 reps - Supine Shoulder Flexion AAROM with Dowel  - 1 x daily - 5 x weekly - 3 sets - 10 reps - Shoulder extension with resistance - Neutral  - 1 x daily - 5 x weekly - 3 sets - 10 reps - Standing Tricep Extensions with Resistance  - 1 x daily - 5 x weekly - 3 sets - 10 reps - Standing Single Arm Bicep Curls Supinated with Dumbbell  - 1 x daily - 5 x weekly - 3 sets - 10 reps  ASSESSMENT:  CLINICAL IMPRESSION: Tx. Focused on L shoulder AA/PROM in pain tolerable range, avoiding extension.  Pt. Remains limited by joint stiffness/ pain.  Pt. Demonstrated an increased willingness to try new interventions now that their resting pain ranges from NPS 2-4/10.  No active L sh. Flexion and unable to hold L sh. At >45 deg. Flexion in seated posture. Pt. Will benefit from skilled PT services to increase L shoulder ROM/ strength to promote return to PLOF/ mobility with SPC.       OBJECTIVE IMPAIRMENTS: decreased activity tolerance, decreased endurance, decreased ROM, decreased strength, hypomobility, impaired flexibility, impaired sensation, improper body mechanics, postural dysfunction, and pain.   ACTIVITY LIMITATIONS: carrying, lifting, bathing, toileting, dressing, self feeding, reach over head, and hygiene/grooming  PARTICIPATION LIMITATIONS: meal prep, cleaning, laundry, and shopping  PERSONAL FACTORS: Fitness and Past/current experiences are also  affecting patient's functional outcome.   REHAB POTENTIAL: Good  CLINICAL DECISION MAKING: Stable/uncomplicated  EVALUATION COMPLEXITY: Moderate   GOALS: Goals reviewed with patient? Yes  SHORT TERM GOALS: Target date: 11/26/23  Pt. Independent with HEP to increase L shoulder PROM to 90 deg. Flexion/ abduction in seated posture to improve L sh. Joint capsule.   Baseline:  See above Goal status: Goal met  2.  Pt. Able to complete HEP with no increase c/o L shoulder pain to improve L sh. Joint mobility/ stability.   Baseline:  Goal status: Partially met  LONG TERM GOALS: Target date: 12/17/23  Pt. Will increase FOTO to 61 to improve pain-free mobility.   Baseline: initial 32 Goal status: INITIAL  2.  Pt. Will increase L shoulder AROM to >90 deg. Flexion to improve management of hair/ feeding.   Baseline: no L shoulder AROM at this time.   Goal status: INITIAL  3.  Pt. Able to ambulate with use of SPC on L with consistent recip. Gait pattern and on c/o L shoulder pain to improve household mobility.   Baseline: Pt. Unable to use L shoulder/ SPC at this time.  Goal status: INITIAL  PLAN:  PT FREQUENCY: 2x/week  PT DURATION: 6 weeks  PLANNED INTERVENTIONS: 97110-Therapeutic exercises, 97530- Therapeutic activity, [id number]- Neuromuscular re-education, 97535- Self Care, [id number]- Manual therapy, Patient/Family education, Joint mobilization, DME instructions, Cryotherapy, and Moist heat  PLAN FOR NEXT SESSION: Add periscapular exercises to HEP.  Check goals/ discuss POC   Cammie Mcgee, PT, DPT # [phone] 12/10/2023, 11:17 AM   Milford Cage, SPT

## 2023-12-11 ENCOUNTER — Encounter: Payer: Medicare Other | Admitting: Physical Therapy

## 2023-12-12 ENCOUNTER — Ambulatory Visit: Payer: Medicare Other | Admitting: Physical Therapy

## 2023-12-12 ENCOUNTER — Encounter: Payer: Self-pay | Admitting: Physical Therapy

## 2023-12-12 DIAGNOSIS — M25612 Stiffness of left shoulder, not elsewhere classified: Secondary | ICD-10-CM

## 2023-12-12 DIAGNOSIS — M25512 Pain in left shoulder: Secondary | ICD-10-CM | POA: Diagnosis not present

## 2023-12-12 DIAGNOSIS — M6281 Muscle weakness (generalized): Secondary | ICD-10-CM

## 2023-12-12 NOTE — Therapy (Signed)
OUTPATIENT PHYSICAL THERAPY SHOULDER TREATMENT  Patient Name: Evelyn Cisneros MRN: [id number] DOB:[birth year], 83 y.o., female Today's Date: 12/12/2023  END OF SESSION:  PT End of Session - 12/12/23 1247     Visit Number 9    Number of Visits 12    Date for PT Re-Evaluation 12/17/23    PT Start Time 1243    PT Stop Time 1348    PT Time Calculation (min) 65 min    Behavior During Therapy [facility] for tasks assessed/performed             Past Medical History:  Diagnosis Date   Anemia    Arthritis    Heart murmur    Hypertension    Hypothyroidism    Spondylolisthesis of lumbar region    Past Surgical History:  Procedure Laterality Date   ABDOMINAL HYSTERECTOMY     REPLACEMENT TOTAL KNEE BILATERAL     SPINE SURGERY     Patient Active Problem List   Diagnosis Date Noted   Spondylolisthesis of lumbar region 05/04/2018   Abnormal glucose level 02/12/2018   Abnormal weight gain 02/12/2018   Allergic rhinitis 02/12/2018   Asthma 02/12/2018   Bradycardia 02/12/2018   Edema 02/12/2018   Heart murmur 02/12/2018   Hypercalcemia 02/12/2018   Hypokalemia 02/12/2018   Pure hypercholesterolemia 02/12/2018   Shoulder joint pain 02/12/2018   Skin sensation disturbance 02/12/2018   Syncope 02/12/2018   Vitamin D deficiency 02/12/2018   Central cervical cord injury, 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, 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) 04/24/2017   Muscle spasticity 04/24/2017   Weight loss 12/19/2016   Hypothyroidism 11/24/2016   Bilateral leg edema 11/24/2016   Cervical radiculopathy 11/24/2016   Disc degeneration, lumbar 11/24/2016   Essential hypertension 11/24/2016   Gallstones 11/24/2016   History of anxiety state 11/24/2016   Hyperlipidemia, unspecified 11/24/2016   Spinal stenosis of lumbar region with neurogenic claudication 11/08/2016   Anxiety 06/08/2015   PCP: Verner Mould, MD  REFERRING PROVIDER: Michell Heinrich, PA-C  REFERRING DIAG: [phone] (ICD-10-CM) - Anterior dislocation of left  humerus   THERAPY DIAG:  Post-traumatic stiffness of left shoulder joint  Acute pain of left shoulder  Muscle weakness (generalized)  Rationale for Evaluation and Treatment: Rehabilitation  ONSET DATE: 10/20/23  SUBJECTIVE:                                                                                                                                                                                      SUBJECTIVE STATEMENT: Pt. Reports falling while pushing shopping cart in parking lot at [place].  Pt. Hit head resulting in lacerations/ stitches and L anterior shoulder dislocation.  Pt. Reports 0/10  L shoulder pain at rest and 2/10 pain with eating/brushing teeth.  Pt. Was using SPC on L prior to fall but limited due to shoulder sling.  Pt. Entered PT in w/c today due to inability to use L UE at this time.  Hand dominance: Left  PERTINENT HISTORY: Subjective   Evelyn Cisneros is a 83 y.o. female in today for: HPI: History of Present Illness  Patient here today for suture removal. She underwent lac repair of her forehead after a fall on concrete 12/30. Repaired 2 lacs with 2 interrupted sutures and 6 running locked sutures.   Today, patient reports healing well. She has had a nodule come up on her left wrist. It is mildly tender to palpation, does not bother her otherwise.   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 90 tablet 3  atorvastatin (LIPITOR) 40 MG tablet TAKE 1 TABLET(40 MG) BY MOUTH EVERY DAY 90 tablet 3  biotin 5 mg Tab Take by mouth  busPIRone (BUSPAR) 5 MG tablet Take 1 tablet (5 mg total) by mouth 2 (two) times daily as needed 30 tablet 5  calcium lactate 100 mg calcium Tab Take by mouth  cholecalciferol, vitamin D3, (VITAMIN D3) 125 mcg (5,000 unit) Tab Take 1 tablet by mouth every morning 90 tablet 3  co-enzyme Q-10, ubiquinone, 200 mg capsule Take 200 mg by mouth every morning.   cyanocobalamin (VITAMIN B12) 1,000 mcg SL tablet Take by mouth  ferrous sulfate 325 (65 FE) MG tablet Take 1 tablet (325 mg total) by mouth twice a week 90 tablet 3  FUROsemide (LASIX) 20 MG tablet Take 1-2 tablets (20-40 mg total) by mouth once daily as needed for Edema 60 tablet 4  gabapentin (NEURONTIN) 300 MG capsule TAKE ONE CAPSULE BY MOUTH THREE TIMES DAILY TO FOUR TIMES DAILY AS NEEDED FOR PAIN 360 capsule 3  Lactobacillus acidophilus (PROBIOTIC ORAL) Take by mouth once daily  levothyroxine (SYNTHROID) 50 MCG tablet Take 1.5 tablets (75 mcg total) by mouth once daily 135 tablet 3  lidocaine (LIDODERM) 5 % patch as needed  losartan (COZAAR) 100 MG tablet TAKE 1 TABLET(100 MG) BY MOUTH EVERY DAY 90 tablet 3  MAGNESIUM ORAL Take by mouth  VIT C/VIT E AC/LUT/COPPER/ZINC (PRESERVISION LUTEIN ORAL) Take 1 tablet by mouth every morning. Last dose one week prior to procedure  ZINC ORAL Take 50 mg by mouth once daily   No facility-administered medications prior to visit.   Objective   Vitals:  10/30/23 1350  PainSc: 0-No pain   There is no height or weight on file to calculate BMI. Home Vitals:  Physical Exam Constitutional:  General: She is not in acute distress. Appearance: Normal appearance.  HENT:  Head: Normocephalic and atraumatic.  Eyes:  General: No scleral icterus. Extraocular Movements: Extraocular  movements intact.  Conjunctiva/sclera: Conjunctivae normal.  Cardiovascular:  Rate and Rhythm: Normal rate and regular  rhythm.  Pulmonary:  Effort: Pulmonary effort is normal. No respiratory distress.  Breath sounds: Normal breath sounds.  Musculoskeletal:  General: Normal range of motion.  Cervical back: Normal range of motion and neck supple.  Comments: Head lacerations with good interval healing. All sutures visualized for removal.   Left hand with significant bruising from PIPs to upper wrist, interval resolution present. There is a cystic lesion, nonmobile, mildly tender to manipulation present in her posterior central wrist.  Skin: General: Skin is warm and dry.  Coloration: Skin is not jaundiced.  Findings: No erythema or rash.  Neurological:  General: No focal deficit present.  Mental Status: She is alert.  Cranial Nerves: No cranial nerve deficit.  Coordination: Coordination normal.   Assessment & Plan  Diagnoses and all orders for this visit:  Visit for suture removal - Running central scalp suture and 2x interrupted suture of right scalp removed without difficulty. Area clean with good interval healing to wound.  - Recommend gentle washing with soap and water. Avoid aggressive scrubbing. Keep area clean and dry. Reviewed warning s/s for wound healing or new onset infection requiring further evaluation.  Wrist lesion - Possible ganglion cyst vs healing blister pocket, difficult to evaluate fully with overall bruising and injury from her recent fall. Reviewed possible diagnoses. If pain, worsening swelling, numbness/tingling or movement limitations recommend earlier evaluation. Otherwise recommend evaluation by orthopedics at her follow up if symptoms do not resolve with the rest of her injuries.   PAIN:  Are you having pain? Yes: NPRS scale: 2/10 Pain location: L shoulder Pain description: sharp/aching Aggravating factors: movement/ brushing teeth Relieving factors:  rest/ use of sling  PRECAUTIONS: Shoulder  RED FLAGS: None   WEIGHT BEARING RESTRICTIONS: No  FALLS:  Has patient fallen in last 6 months? Yes. Number of falls 2  LIVING ENVIRONMENT: Lives with: lives with their spouse Lives in: House/apartment Has following equipment at home: Single point cane and Wheelchair (manual)  OCCUPATION: Retired  PLOF: Independent with household mobility with device  PATIENT GOALS:  Increase L shoulder AROM/ strength/ pain-free mobility.    NEXT MD VISIT: 11/14/23 with Dr. Franco Collet (ortho MD)  OBJECTIVE:  Note: Objective measures were completed at Evaluation unless otherwise noted.  DIAGNOSTIC FINDINGS:  See imaging  PATIENT SURVEYS:  FOTO initial 32/ goal 67   COGNITION: Overall cognitive status: Within functional limits for tasks assessed     SENSATION: WFL.  Moderate L hand bruising.    POSTURE: Rounded shoulders/ forward posture.  Use of L shoulder sling.    UPPER EXTREMITY ROM:  Full tear of R supraspinatus reported.    Passive ROM Right Eval AROM Left Eval PROM  Shoulder flexion 74 deg. 53 deg.  Shoulder extension    Shoulder abduction 70 deg. 42 deg.  Shoulder adduction    Shoulder internal rotation  [facility]  Shoulder external rotation  0 deg.  Elbow flexion [facility] WFL  Elbow extension [facility] WFL  Wrist flexion    Wrist extension    Wrist ulnar deviation    Wrist radial deviation    Wrist pronation    Wrist supination    (Blank rows = not tested)  UPPER EXTREMITY MMT: No MMT testing today.    SHOULDER SPECIAL TESTS: NT  JOINT MOBILITY TESTING:  No L shoulder joint mobs. Secondary to anterior dislocation.    PALPATION:  Generalized L hand tenderness/ bruising.  TREATMENT DATE: 12/12/2023  Subjective: Pt. Reports she took 2 Tylenol prior to PT tx. Session today secondary to L shoulder soreness  from doing a lot at home.  Pt. Entered PT with use of SPC and husband SBA.    There.act.:  Nautilus: AAROM/min. Assist with all movements.  Seated lat pull down 20# 2x15  Seated mid row 20# 2x15 Seated single arm neutral row 20# 2x15  Standing Blaze pods at table top:  6 pods with use of 1# dumbbells (Focus set-up).    There.ex.:  Seated shoulder ER 2# 10x2/ bicep curls 1# 15x2.    Seated L shoulder wand ex. At 50-90 deg. Shoulder flexion (circles)- added GTB resistance.   Discussed HEP  PATIENT EDUCATION: Education details: Reviewed HEP Person educated: Patient and Spouse Education method: Medical illustrator Education comprehension: verbalized understanding and returned demonstration  HOME EXERCISE PROGRAM: Access Code: [email or web address] URL: [email or web address] Date: 11/06/2023 Prepared by: Dorene Grebe  Exercises - Seated Scapular Retraction  - 1 x daily - 7 x weekly - 2 sets - 10 reps - Standing Isometric Shoulder External Rotation with Doorway  - 1 x daily - 7 x weekly - 2 sets - 10 reps - 5-10sec hold - Isometric Shoulder Abduction at Wall  - 1 x daily - 7 x weekly - 2 sets - 10 reps - 5-10 sec hold - Isometric Shoulder Flexion at Wall  - 1 x daily - 7 x weekly - 2 sets - 10 reps - 5-10 sec hold   Access Code: [email or web address] URL: [email or web address] Date: 11/18/2023 Prepared by: Dorene Grebe  Exercises - Seated Scapular Retraction  - 1 x daily - 7 x weekly - 2 sets - 10 reps - Supine Shoulder External Rotation AAROM with Dowel  - 1 x daily - 7 x weekly - 3 sets - 10 reps - Supine Shoulder Flexion Overhead with Dowel  - 1 x daily - 7 x weekly - 3 sets - 10 reps - Supine Shoulder Press with Dowel  - 1 x daily - 7 x weekly - 3 sets - 10 reps - Supine Shoulder Abduction AAROM with Dowel  - 1 x daily - 7 x weekly - 3 sets - 10 reps - Supine Shoulder Flexion AAROM with Dowel  - 1 x daily - 7 x weekly - 3 sets - 10 reps  Access Code:  [email or web address] URL: [email or web address] Date: 12/02/2023 Prepared by: Dorene Grebe  Exercises - Supine Shoulder External Rotation AAROM with Dowel  - 1 x daily - 5 x weekly - 3 sets - 10 reps - Supine Shoulder Flexion Overhead with Dowel  - 1 x daily - 5 x weekly - 3 sets - 10 reps - Supine Shoulder Press with Dowel  - 1 x daily - 5 x weekly - 3 sets - 10 reps - Supine Shoulder Abduction AAROM with Dowel  - 1 x daily - 5 x weekly - 3 sets - 10 reps - Supine Shoulder Flexion AAROM with Dowel  - 1 x daily - 5 x weekly - 3 sets - 10 reps - Shoulder extension with resistance - Neutral  - 1 x daily - 5 x weekly - 3 sets - 10 reps - Standing Tricep Extensions with Resistance  - 1 x daily - 5 x weekly - 3 sets - 10 reps - Standing Single Arm Bicep Curls Supinated with Dumbbell  - 1 x daily - 5 x weekly - 3 sets - 10 reps  ASSESSMENT:  CLINICAL IMPRESSION:  Tx. Focused on L shoulder AA/PROM in pain tolerable range, avoiding extension.  Pt. Remains limited by joint stiffness/ pain.  Pt. Did well with L shoulder reaching/ cross body tasks during Blaze pod tasks with 1# wt.  L shoulder fatigue with functional tasks/ dowel resisted stabs.  Pt. Will benefit from skilled PT services to increase L shoulder ROM/ strength to promote return to PLOF/ mobility with SPC.       OBJECTIVE IMPAIRMENTS: decreased activity tolerance, decreased endurance, decreased ROM, decreased strength, hypomobility, impaired flexibility, impaired sensation, improper body mechanics, postural dysfunction, and pain.   ACTIVITY LIMITATIONS: carrying, lifting, bathing, toileting, dressing, self feeding, reach over head, and hygiene/grooming  PARTICIPATION LIMITATIONS: meal prep, cleaning, laundry, and shopping  PERSONAL FACTORS: Fitness and Past/current experiences are also affecting patient's functional outcome.   REHAB POTENTIAL: Good  CLINICAL DECISION MAKING: Stable/uncomplicated  EVALUATION COMPLEXITY:  Moderate   GOALS: Goals reviewed with patient? Yes  SHORT TERM GOALS: Target date: 11/26/23  Pt. Independent with HEP to increase L shoulder PROM to 90 deg. Flexion/ abduction in seated posture to improve L sh. Joint capsule.   Baseline:  See above Goal status: Goal met  2.  Pt. Able to complete HEP with no increase c/o L shoulder pain to improve L sh. Joint mobility/ stability.   Baseline:  Goal status: Partially met  LONG TERM GOALS: Target date: 12/17/23  Pt. Will increase FOTO to 61 to improve pain-free mobility.   Baseline: initial 32 Goal status: INITIAL  2.  Pt. Will increase L shoulder AROM to >90 deg. Flexion to improve management of hair/ feeding.   Baseline: no L shoulder AROM at this time.   Goal status: INITIAL  3.  Pt. Able to ambulate with use of SPC on L with consistent recip. Gait pattern and on c/o L shoulder pain to improve household mobility.   Baseline: Pt. Unable to use L shoulder/ SPC at this time.  Goal status: INITIAL  PLAN:  PT FREQUENCY: 2x/week  PT DURATION: 6 weeks  PLANNED INTERVENTIONS: 97110-Therapeutic exercises, 97530- Therapeutic activity, [id number]- Neuromuscular re-education, 97535- Self Care, [id number]- Manual therapy, Patient/Family education, Joint mobilization, DME instructions, Cryotherapy, and Moist heat  PLAN FOR NEXT SESSION: Add periscapular exercises to HEP.  Check goals/ discuss POC   Cammie Mcgee, PT, DPT # [phone] 12/12/2023, 1:52 PM   Milford Cage, SPT

## 2023-12-15 ENCOUNTER — Ambulatory Visit: Payer: Medicare Other | Admitting: Physical Therapy

## 2023-12-15 ENCOUNTER — Encounter: Payer: Self-pay | Admitting: Physical Therapy

## 2023-12-15 DIAGNOSIS — M25512 Pain in left shoulder: Secondary | ICD-10-CM | POA: Diagnosis not present

## 2023-12-15 DIAGNOSIS — M25612 Stiffness of left shoulder, not elsewhere classified: Secondary | ICD-10-CM

## 2023-12-15 DIAGNOSIS — M6281 Muscle weakness (generalized): Secondary | ICD-10-CM

## 2023-12-15 NOTE — Therapy (Signed)
 OUTPATIENT PHYSICAL THERAPY SHOULDER TREATMENT Physical Therapy Progress Note  Dates of reporting period  11/05/23   to   12/15/23   Patient Name: Evelyn Cisneros MRN: [id number] DOB:29-Jan-1941, 83 y.o., female Today's Date: 12/15/2023  END OF SESSION:  PT End of Session - 12/15/23 1102     Visit Number 10    Number of Visits 12    Date for PT Re-Evaluation 12/17/23    PT Start Time 1106    PT Stop Time 1157    PT Time Calculation (min) 51 min    Behavior During Therapy [facility] for tasks assessed/performed             Past Medical History:  Diagnosis Date   Anemia    Arthritis    Heart murmur    Hypertension    Hypothyroidism    Spondylolisthesis of lumbar region    Past Surgical History:  Procedure Laterality Date   ABDOMINAL HYSTERECTOMY     REPLACEMENT TOTAL KNEE BILATERAL     SPINE SURGERY     Patient Active Problem List   Diagnosis Date Noted   Spondylolisthesis of lumbar region 05/04/2018   Abnormal glucose level 02/12/2018   Abnormal weight gain 02/12/2018   Allergic rhinitis 02/12/2018   Asthma 02/12/2018   Bradycardia 02/12/2018   Edema 02/12/2018   Heart murmur 02/12/2018   Hypercalcemia 02/12/2018   Hypokalemia 02/12/2018   Pure hypercholesterolemia 02/12/2018   Shoulder joint pain 02/12/2018   Skin sensation disturbance 02/12/2018   Syncope 02/12/2018   Vitamin D deficiency 02/12/2018   Central cervical cord injury, 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, 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) 04/24/2017   Muscle spasticity 04/24/2017   Weight loss 12/19/2016   Hypothyroidism 11/24/2016   Bilateral leg edema 11/24/2016   Cervical radiculopathy 11/24/2016   Disc degeneration, lumbar 11/24/2016   Essential hypertension 11/24/2016   Gallstones 11/24/2016   History of anxiety state 11/24/2016   Hyperlipidemia, unspecified 11/24/2016   Spinal stenosis of lumbar region with neurogenic claudication 11/08/2016   Anxiety 06/08/2015   PCP: Verner Mould, MD  REFERRING PROVIDER: Michell Heinrich, PA-C  REFERRING DIAG: [phone] (ICD-10-CM) - Anterior dislocation of left humerus   THERAPY DIAG:  Post-traumatic stiffness of left shoulder joint  Acute pain of left shoulder  Muscle weakness (generalized)  Rationale for Evaluation and Treatment: Rehabilitation  ONSET DATE: 10/20/23  SUBJECTIVE:                                                                                                                                                                                      SUBJECTIVE STATEMENT: Pt. Reports falling while pushing shopping cart in parking lot at  [employer].  Pt. Hit head resulting in lacerations/ stitches and L anterior shoulder dislocation.  Pt. Reports 0/10 L shoulder pain at rest and 2/10 pain with eating/brushing teeth.  Pt. Was using SPC on L prior to fall but limited due to shoulder sling.  Pt. Entered PT in w/c today due to inability to use L UE at this time.  Hand dominance: Left  PERTINENT HISTORY: Subjective   Evelyn Cisneros is a 83 y.o. female in today for: HPI: History of Present Illness  Patient here today for suture removal. She underwent lac repair of her forehead after a fall on concrete 12/30. Repaired 2 lacs with 2 interrupted sutures and 6 running locked sutures.   Today, patient reports healing well. She has had a nodule come up on her left wrist. It is mildly tender to palpation, does not bother her otherwise.   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 90 tablet 3  atorvastatin (LIPITOR) 40 MG tablet TAKE 1 TABLET(40 MG) BY MOUTH EVERY DAY 90 tablet 3  biotin 5 mg Tab Take by mouth  busPIRone (BUSPAR) 5 MG tablet Take 1 tablet (5 mg total) by mouth 2 (two) times daily as needed 30 tablet 5  calcium lactate 100 mg calcium Tab Take by mouth  cholecalciferol, vitamin D3, (VITAMIN D3) 125 mcg (5,000 unit) Tab Take 1 tablet by mouth every morning 90 tablet 3  co-enzyme Q-10, ubiquinone, 200 mg capsule Take 200 mg by mouth every morning.   cyanocobalamin (VITAMIN B12) 1,000 mcg SL tablet Take by mouth  ferrous sulfate 325 (65 FE) MG tablet Take 1 tablet (325 mg total) by mouth twice a week 90 tablet 3  FUROsemide (LASIX) 20 MG tablet Take 1-2 tablets (20-40 mg total) by mouth once daily as needed for Edema 60 tablet 4  gabapentin (NEURONTIN) 300 MG capsule TAKE ONE CAPSULE BY MOUTH THREE TIMES DAILY TO FOUR TIMES DAILY AS NEEDED FOR PAIN 360 capsule 3  Lactobacillus acidophilus (PROBIOTIC ORAL) Take by mouth once daily  levothyroxine (SYNTHROID) 50 MCG tablet Take 1.5 tablets (75 mcg total) by mouth once daily 135 tablet 3  lidocaine (LIDODERM) 5 % patch as needed  losartan (COZAAR) 100 MG tablet TAKE 1 TABLET(100 MG) BY MOUTH EVERY DAY 90 tablet 3  MAGNESIUM ORAL Take by mouth  VIT C/VIT E AC/LUT/COPPER/ZINC (PRESERVISION LUTEIN ORAL) Take 1 tablet by mouth every morning. Last dose one week prior to procedure  ZINC ORAL Take 50 mg by mouth once daily   No facility-administered medications prior to visit.   Objective   Vitals:  10/30/23 1350  PainSc: 0-No pain   There is no height or weight on file to calculate BMI. Home Vitals:  Physical Exam Constitutional:  General: She is not in acute distress. Appearance: Normal appearance.  HENT:  Head: Normocephalic and  atraumatic.  Eyes:  General: No scleral icterus. Extraocular  Movements: Extraocular movements intact.  Conjunctiva/sclera: Conjunctivae normal.  Cardiovascular:  Rate and Rhythm: Normal rate and regular rhythm.  Pulmonary:  Effort: Pulmonary effort is normal. No respiratory distress.  Breath sounds: Normal breath sounds.  Musculoskeletal:  General: Normal range of motion.  Cervical back: Normal range of motion and neck supple.  Comments: Head lacerations with good interval healing. All sutures visualized for removal.   Left hand with significant bruising from PIPs to upper wrist, interval resolution present. There is a cystic lesion, nonmobile, mildly tender to manipulation present in her posterior central wrist.  Skin: General: Skin is warm and dry.  Coloration: Skin is not jaundiced.  Findings: No erythema or rash.  Neurological:  General: No focal deficit present.  Mental Status: She is alert.  Cranial Nerves: No cranial nerve deficit.  Coordination: Coordination normal.   Assessment & Plan  Diagnoses and all orders for this visit:  Visit for suture removal - Running central scalp suture and 2x interrupted suture of right scalp removed without difficulty. Area clean with good interval healing to wound.  - Recommend gentle washing with soap and water. Avoid aggressive scrubbing. Keep area clean and dry. Reviewed warning s/s for wound healing or new onset infection requiring further evaluation.  Wrist lesion - Possible ganglion cyst vs healing blister pocket, difficult to evaluate fully with overall bruising and injury from her recent fall. Reviewed possible diagnoses. If pain, worsening swelling, numbness/tingling or movement limitations recommend earlier evaluation. Otherwise recommend evaluation by orthopedics at her follow up if symptoms do not resolve with the rest of her injuries.   PAIN:  Are you having pain? Yes: NPRS scale: 2/10 Pain location: L shoulder Pain  description: sharp/aching Aggravating factors: movement/ brushing teeth Relieving factors: rest/ use of sling  PRECAUTIONS: Shoulder  RED FLAGS: None   WEIGHT BEARING RESTRICTIONS: No  FALLS:  Has patient fallen in last 6 months? Yes. Number of falls 2  LIVING ENVIRONMENT: Lives with: lives with their spouse Lives in: House/apartment Has following equipment at home: Single point cane and Wheelchair (manual)  OCCUPATION: Retired  PLOF: Independent with household mobility with device  PATIENT GOALS:  Increase L shoulder AROM/ strength/ pain-free mobility.    NEXT MD VISIT: 11/14/23 with Dr. Franco Collet (ortho MD)  OBJECTIVE:  Note: Objective measures were completed at Evaluation unless otherwise noted.  DIAGNOSTIC FINDINGS:  See imaging  PATIENT SURVEYS:  FOTO initial 32/ goal 84   COGNITION: Overall cognitive status: Within functional limits for tasks assessed     SENSATION: WFL.  Moderate L hand bruising.    POSTURE: Rounded shoulders/ forward posture.  Use of L shoulder sling.    UPPER EXTREMITY ROM:  Full tear of R supraspinatus reported.    Passive ROM Right Eval AROM Left Eval PROM  Shoulder flexion 74 deg. 53 deg.  Shoulder extension    Shoulder abduction 70 deg. 42 deg.  Shoulder adduction    Shoulder internal rotation  [facility]  Shoulder external rotation  0 deg.  Elbow flexion [facility] WFL  Elbow extension [facility] WFL  Wrist flexion    Wrist extension    Wrist ulnar deviation    Wrist radial deviation    Wrist pronation    Wrist supination    (Blank rows = not tested)  UPPER EXTREMITY MMT: No MMT testing today.    SHOULDER SPECIAL TESTS: NT  JOINT MOBILITY TESTING:  No L shoulder joint mobs. Secondary to anterior dislocation.    PALPATION:  Generalized L hand tenderness/  bruising.                                                                                                                               TREATMENT DATE: 12/15/2023  Subjective: Pt.  Reports she took 1 Tylenol prior to PT tx. Session today secondary to L shoulder soreness from doing a lot at home.  Pt. Went to Sunday school at church for 1st time since L shoulder issue.    There.ex.:  Seated shoulder pulley (flexion/ scaption/ abduction)- 20x each.  Warm-up.    Seated shoulder bicep curls 2#/1# 15x2.    Seated L shoulder ball Adduction isometrics 10x.    Discussed HEP  There.act.:  Standing Blaze pods at table top:  6 pods with use of 1# dumbbells (Focus set-up).  Attempted 2# wt. For initial attempt (challenged).    Nautilus: AAROM/min. Assist with all movements.  Seated lat pull down 20# 2x15  Seated mid row 20# 2x15 Seated single arm neutral row 20# 2x15   PATIENT EDUCATION: Education details: Reviewed HEP Person educated: Patient and Spouse Education method: Explanation and Demonstration Education comprehension: verbalized understanding and returned demonstration  HOME EXERCISE PROGRAM: Access Code: [email or web address] URL: [email or web address] Date: 11/06/2023 Prepared by: Dorene Grebe  Exercises - Seated Scapular Retraction  - 1 x daily - 7 x weekly - 2 sets - 10 reps - Standing Isometric Shoulder External Rotation with Doorway  - 1 x daily - 7 x weekly - 2 sets - 10 reps - 5-10sec hold - Isometric Shoulder Abduction at Wall  - 1 x daily - 7 x weekly - 2 sets - 10 reps - 5-10 sec hold - Isometric Shoulder Flexion at Wall  - 1 x daily - 7 x weekly - 2 sets - 10 reps - 5-10 sec hold   Access Code: [email or web address] URL: [email or web address] Date: 11/18/2023 Prepared by: Dorene Grebe  Exercises - Seated Scapular Retraction  - 1 x daily - 7 x weekly - 2 sets - 10 reps - Supine Shoulder External Rotation AAROM with Dowel  - 1 x daily - 7 x weekly - 3 sets - 10 reps - Supine Shoulder Flexion Overhead with Dowel  - 1 x daily - 7 x weekly - 3 sets - 10 reps - Supine Shoulder Press with Dowel  - 1 x daily - 7 x weekly - 3 sets - 10  reps - Supine Shoulder Abduction AAROM with Dowel  - 1 x daily - 7 x weekly - 3 sets - 10 reps - Supine Shoulder Flexion AAROM with Dowel  - 1 x daily - 7 x weekly - 3 sets - 10 reps  Access Code: [email or web address] URL: [email or web address] Date: 12/02/2023 Prepared by: Dorene Grebe  Exercises - Supine Shoulder External Rotation AAROM with Dowel  - 1 x daily - 5 x weekly - 3 sets - 10 reps - Supine Shoulder Flexion Overhead with Dowel  - 1 x daily - 5 x  weekly - 3 sets - 10 reps - Supine Shoulder Press with Dowel  - 1 x daily - 5 x weekly - 3 sets - 10 reps - Supine Shoulder Abduction AAROM with Dowel  - 1 x daily - 5 x weekly - 3 sets - 10 reps - Supine Shoulder Flexion AAROM with Dowel  - 1 x daily - 5 x weekly - 3 sets - 10 reps - Shoulder extension with resistance - Neutral  - 1 x daily - 5 x weekly - 3 sets - 10 reps - Standing Tricep Extensions with Resistance  - 1 x daily - 5 x weekly - 3 sets - 10 reps - Standing Single Arm Bicep Curls Supinated with Dumbbell  - 1 x daily - 5 x weekly - 3 sets - 10 reps  ASSESSMENT:  CLINICAL IMPRESSION: Tx. Focused on L shoulder AA/PROM in pain tolerable range and progression of L shoulder functional reaching/ strengthening.  Pt. Remains limited by joint stiffness/ pain.  Pt. Did well with L shoulder reaching/ cross body tasks during Blaze pod tasks with 1# wt (difficulty with 2# wt.).  L shoulder fatigue with functional tasks/ dowel resisted stabs.  Pt. Will benefit from skilled PT services to increase L shoulder ROM/ strength to promote return to PLOF/ mobility with SPC.       OBJECTIVE IMPAIRMENTS: decreased activity tolerance, decreased endurance, decreased ROM, decreased strength, hypomobility, impaired flexibility, impaired sensation, improper body mechanics, postural dysfunction, and pain.   ACTIVITY LIMITATIONS: carrying, lifting, bathing, toileting, dressing, self feeding, reach over head, and hygiene/grooming  PARTICIPATION  LIMITATIONS: meal prep, cleaning, laundry, and shopping  PERSONAL FACTORS: Fitness and Past/current experiences are also affecting patient's functional outcome.   REHAB POTENTIAL: Good  CLINICAL DECISION MAKING: Stable/uncomplicated  EVALUATION COMPLEXITY: Moderate   GOALS: Goals reviewed with patient? Yes  SHORT TERM GOALS: Target date: 11/26/23  Pt. Independent with HEP to increase L shoulder PROM to 90 deg. Flexion/ abduction in seated posture to improve L sh. Joint capsule.   Baseline:  See above Goal status: Goal met  2.  Pt. Able to complete HEP with no increase c/o L shoulder pain to improve L sh. Joint mobility/ stability.   Baseline:  Goal status: Partially met  LONG TERM GOALS: Target date: 12/17/23  Pt. Will increase FOTO to 61 to improve pain-free mobility.   Baseline: initial 32 Goal status: INITIAL  2.  Pt. Will increase L shoulder AROM to >90 deg. Flexion to improve management of hair/ feeding.   Baseline: no L shoulder AROM at this time.   2/24:  pt. Challenged managing hair with compensatory movement patterns.   Goal status: Not met  3.  Pt. Able to ambulate with use of SPC on L with consistent recip. Gait pattern and on c/o L shoulder pain to improve household mobility.   Baseline: Pt. Unable to use L shoulder/ SPC at this time.  Goal status: Goal met  PLAN:  PT FREQUENCY: 2x/week  PT DURATION: 6 weeks  PLANNED INTERVENTIONS: 97110-Therapeutic exercises, 97530- Therapeutic activity, [id number]- Neuromuscular re-education, 97535- Self Care, [id number]- Manual therapy, Patient/Family education, Joint mobilization, DME instructions, Cryotherapy, and Moist heat  PLAN FOR NEXT SESSION:  Add periscapular exercises to HEP.     Cammie Mcgee, PT, DPT # [phone] 12/15/2023, 1:43 PM   Milford Cage, SPT

## 2023-12-17 ENCOUNTER — Ambulatory Visit: Payer: Medicare Other | Admitting: Physical Therapy

## 2023-12-18 ENCOUNTER — Encounter: Payer: Self-pay | Admitting: Physical Therapy

## 2023-12-18 ENCOUNTER — Ambulatory Visit: Payer: Medicare Other | Admitting: Physical Therapy

## 2023-12-18 DIAGNOSIS — M6281 Muscle weakness (generalized): Secondary | ICD-10-CM

## 2023-12-18 DIAGNOSIS — M25512 Pain in left shoulder: Secondary | ICD-10-CM | POA: Diagnosis not present

## 2023-12-18 DIAGNOSIS — M25612 Stiffness of left shoulder, not elsewhere classified: Secondary | ICD-10-CM

## 2023-12-18 NOTE — Therapy (Signed)
 OUTPATIENT PHYSICAL THERAPY SHOULDER TREATMENT/ RECERTIFICATION  Patient Name: Evelyn Cisneros MRN: [id number] DOB:14-Feb-1941, 83 y.o., female Today's Date: 12/18/2023  END OF SESSION:  PT End of Session - 12/18/23 1117     Visit Number 11    Number of Visits 19    Date for PT Re-Evaluation 01/15/24    PT Start Time 1115    PT Stop Time 1200    PT Time Calculation (min) 45 min    Behavior During Therapy [facility] for tasks assessed/performed             Past Medical History:  Diagnosis Date   Anemia    Arthritis    Heart murmur    Hypertension    Hypothyroidism    Spondylolisthesis of lumbar region    Past Surgical History:  Procedure Laterality Date   ABDOMINAL HYSTERECTOMY     REPLACEMENT TOTAL KNEE BILATERAL     SPINE SURGERY     Patient Active Problem List   Diagnosis Date Noted   Spondylolisthesis of lumbar region 05/04/2018   Abnormal glucose level 02/12/2018   Abnormal weight gain 02/12/2018   Allergic rhinitis 02/12/2018   Asthma 02/12/2018   Bradycardia 02/12/2018   Edema 02/12/2018   Heart murmur 02/12/2018   Hypercalcemia 02/12/2018   Hypokalemia 02/12/2018   Pure hypercholesterolemia 02/12/2018   Shoulder joint pain 02/12/2018   Skin sensation disturbance 02/12/2018   Syncope 02/12/2018   Vitamin D deficiency 02/12/2018   Central cervical cord injury, 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, 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) 04/24/2017   Muscle spasticity 04/24/2017   Weight loss 12/19/2016   Hypothyroidism 11/24/2016   Bilateral leg edema 11/24/2016   Cervical radiculopathy 11/24/2016   Disc degeneration, lumbar 11/24/2016   Essential hypertension 11/24/2016   Gallstones 11/24/2016   History of anxiety state 11/24/2016   Hyperlipidemia, unspecified 11/24/2016   Spinal stenosis of lumbar region with neurogenic claudication 11/08/2016   Anxiety 06/08/2015   PCP: Verner Mould, MD  REFERRING PROVIDER: Michell Heinrich, PA-C  REFERRING DIAG: [phone] (ICD-10-CM) - Anterior  dislocation of left humerus   THERAPY DIAG:  Post-traumatic stiffness of left shoulder joint  Acute pain of left shoulder  Muscle weakness (generalized)  Rationale for Evaluation and Treatment: Rehabilitation  ONSET DATE: 10/20/23  SUBJECTIVE:                                                                                                                                                                                      SUBJECTIVE STATEMENT: Pt. Reports falling while pushing shopping cart in parking lot at [place].  Pt. Hit head resulting in lacerations/ stitches and L anterior shoulder dislocation.  Pt. Reports  0/10 L shoulder pain at rest and 2/10 pain with eating/brushing teeth.  Pt. Was using SPC on L prior to fall but limited due to shoulder sling.  Pt. Entered PT in w/c today due to inability to use L UE at this time.  Hand dominance: Left  PERTINENT HISTORY: Subjective   Evelyn Cisneros is a 83 y.o. female in today for: HPI: History of Present Illness  Patient here today for suture removal. She underwent lac repair of her forehead after a fall on concrete 12/30. Repaired 2 lacs with 2 interrupted sutures and 6 running locked sutures.   Today, patient reports healing well. She has had a nodule come up on her left wrist. It is mildly tender to palpation, does not bother her otherwise.   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 90 tablet 3  atorvastatin (LIPITOR) 40 MG tablet TAKE 1 TABLET(40 MG) BY MOUTH EVERY DAY 90 tablet 3  biotin 5 mg Tab Take by mouth  busPIRone (BUSPAR) 5 MG tablet Take 1 tablet (5 mg total) by mouth 2 (two) times daily as needed 30 tablet 5  calcium lactate 100 mg calcium Tab Take by mouth  cholecalciferol, vitamin D3, (VITAMIN D3) 125 mcg (5,000 unit) Tab Take 1 tablet by mouth every morning 90 tablet 3  co-enzyme Q-10, ubiquinone, 200 mg capsule Take 200 mg by mouth every morning.   cyanocobalamin (VITAMIN B12) 1,000 mcg SL tablet Take by mouth  ferrous sulfate 325 (65 FE) MG tablet Take 1 tablet (325 mg total) by mouth twice a week 90 tablet 3  FUROsemide (LASIX) 20 MG tablet Take 1-2 tablets (20-40 mg total) by mouth once daily as needed for Edema 60 tablet 4  gabapentin (NEURONTIN) 300 MG capsule TAKE ONE CAPSULE BY MOUTH THREE TIMES DAILY TO FOUR TIMES DAILY AS NEEDED FOR PAIN 360 capsule 3  Lactobacillus acidophilus (PROBIOTIC ORAL) Take by mouth once daily  levothyroxine (SYNTHROID) 50 MCG tablet Take 1.5 tablets (75 mcg total) by mouth once daily 135 tablet 3  lidocaine (LIDODERM) 5 % patch as needed  losartan (COZAAR) 100 MG tablet TAKE 1 TABLET(100 MG) BY MOUTH EVERY DAY 90 tablet 3  MAGNESIUM ORAL Take by mouth  VIT C/VIT E AC/LUT/COPPER/ZINC (PRESERVISION LUTEIN ORAL) Take 1 tablet by mouth every morning. Last dose one week prior to procedure  ZINC ORAL Take 50 mg by mouth once daily   No facility-administered medications prior to visit.   Objective   Vitals:  10/30/23 1350  PainSc: 0-No pain   There is no height or weight on file to calculate BMI. Home Vitals:  Physical Exam Constitutional:  General: She is not in acute distress. Appearance: Normal appearance.  HENT:  Head: Normocephalic and atraumatic.  Eyes:  General: No scleral icterus. Extraocular Movements:  Extraocular movements intact.  Conjunctiva/sclera: Conjunctivae normal.  Cardiovascular:  Rate and Rhythm: Normal rate and  regular rhythm.  Pulmonary:  Effort: Pulmonary effort is normal. No respiratory distress.  Breath sounds: Normal breath sounds.  Musculoskeletal:  General: Normal range of motion.  Cervical back: Normal range of motion and neck supple.  Comments: Head lacerations with good interval healing. All sutures visualized for removal.   Left hand with significant bruising from PIPs to upper wrist, interval resolution present. There is a cystic lesion, nonmobile, mildly tender to manipulation present in her posterior central wrist.  Skin: General: Skin is warm and dry.  Coloration: Skin is not jaundiced.  Findings: No erythema or rash.  Neurological:  General: No focal deficit present.  Mental Status: She is alert.  Cranial Nerves: No cranial nerve deficit.  Coordination: Coordination normal.   Assessment & Plan  Diagnoses and all orders for this visit:  Visit for suture removal - Running central scalp suture and 2x interrupted suture of right scalp removed without difficulty. Area clean with good interval healing to wound.  - Recommend gentle washing with soap and water. Avoid aggressive scrubbing. Keep area clean and dry. Reviewed warning s/s for wound healing or new onset infection requiring further evaluation.  Wrist lesion - Possible ganglion cyst vs healing blister pocket, difficult to evaluate fully with overall bruising and injury from her recent fall. Reviewed possible diagnoses. If pain, worsening swelling, numbness/tingling or movement limitations recommend earlier evaluation. Otherwise recommend evaluation by orthopedics at her follow up if symptoms do not resolve with the rest of her injuries.   PAIN:  Are you having pain? Yes: NPRS scale: 2/10 Pain location: L shoulder Pain description: sharp/aching Aggravating factors: movement/ brushing  teeth Relieving factors: rest/ use of sling  PRECAUTIONS: Shoulder  RED FLAGS: None   WEIGHT BEARING RESTRICTIONS: No  FALLS:  Has patient fallen in last 6 months? Yes. Number of falls 2  LIVING ENVIRONMENT: Lives with: lives with their spouse Lives in: House/apartment Has following equipment at home: Single point cane and Wheelchair (manual)  OCCUPATION: Retired  PLOF: Independent with household mobility with device  PATIENT GOALS:  Increase L shoulder AROM/ strength/ pain-free mobility.    NEXT MD VISIT: 11/14/23 with Dr. Franco Collet (ortho MD)  OBJECTIVE:  Note: Objective measures were completed at Evaluation unless otherwise noted.  DIAGNOSTIC FINDINGS:  See imaging  PATIENT SURVEYS:  FOTO initial 32/ goal 33   COGNITION: Overall cognitive status: Within functional limits for tasks assessed     SENSATION: WFL.  Moderate L hand bruising.    POSTURE: Rounded shoulders/ forward posture.  Use of L shoulder sling.    UPPER EXTREMITY ROM:  Full tear of R supraspinatus reported.    Passive ROM Right Eval AROM Left Eval PROM  Shoulder flexion 74 deg. 53 deg.  Shoulder extension    Shoulder abduction 70 deg. 42 deg.  Shoulder adduction    Shoulder internal rotation  [facility]  Shoulder external rotation  0 deg.  Elbow flexion [facility] WFL  Elbow extension [facility] WFL  Wrist flexion    Wrist extension    Wrist ulnar deviation    Wrist radial deviation    Wrist pronation    Wrist supination    (Blank rows = not tested)  UPPER EXTREMITY MMT: No MMT testing today.    SHOULDER SPECIAL TESTS: NT  JOINT MOBILITY TESTING:  No L shoulder joint mobs. Secondary to anterior dislocation.    PALPATION:  Generalized L hand tenderness/ bruising.  TREATMENT DATE: 12/18/2023  Subjective: Pt. Reports she took 2 Tylenol prior to PT tx. Session today secondary  to L shoulder soreness from home activity.  Pt. Described that they have a busy weekend ahead of them. Pt. Noted that their current pain is 3/10 NPS upon arrival. Pt. Expresses having difficulty with dressing and grooming themselves still.   There.ex.:  Seated shoulder pulley (flexion/ scaption/ abduction)- 20x each.  Warm-up.    Seated shoulder bicep curls 2#/1# 15x2.    Seated L shoulder ball Adduction isometrics 10x.    Discussed HEP  There.act.:  Standing Blaze pods at table top:  6 pods with use of 1# dumbbells (Focus set-up).  Attempted 2# wt. For initial attempt (challenged).    Nautilus: AAROM/min. Assist with all movements.  Seated lat pull down 20# 2x15  Seated mid row 20# 2x15 Seated neutral grip low row 20# 2x15   PATIENT EDUCATION: Education details: Reviewed HEP Person educated: Patient and Spouse Education method: Explanation and Demonstration Education comprehension: verbalized understanding and returned demonstration  HOME EXERCISE PROGRAM: Access Code: [email or web address] URL: [email or web address] Date: 11/06/2023 Prepared by: Dorene Grebe  Exercises - Seated Scapular Retraction  - 1 x daily - 7 x weekly - 2 sets - 10 reps - Standing Isometric Shoulder External Rotation with Doorway  - 1 x daily - 7 x weekly - 2 sets - 10 reps - 5-10sec hold - Isometric Shoulder Abduction at Wall  - 1 x daily - 7 x weekly - 2 sets - 10 reps - 5-10 sec hold - Isometric Shoulder Flexion at Wall  - 1 x daily - 7 x weekly - 2 sets - 10 reps - 5-10 sec hold   Access Code: [email or web address] URL: [email or web address] Date: 11/18/2023 Prepared by: Dorene Grebe  Exercises - Seated Scapular Retraction  - 1 x daily - 7 x weekly - 2 sets - 10 reps - Supine Shoulder External Rotation AAROM with Dowel  - 1 x daily - 7 x weekly - 3 sets - 10 reps - Supine Shoulder Flexion Overhead with Dowel  - 1 x daily - 7 x weekly - 3 sets - 10 reps - Supine Shoulder Press with  Dowel  - 1 x daily - 7 x weekly - 3 sets - 10 reps - Supine Shoulder Abduction AAROM with Dowel  - 1 x daily - 7 x weekly - 3 sets - 10 reps - Supine Shoulder Flexion AAROM with Dowel  - 1 x daily - 7 x weekly - 3 sets - 10 reps  Access Code: [email or web address] URL: [email or web address] Date: 12/02/2023 Prepared by: Dorene Grebe  Exercises - Supine Shoulder External Rotation AAROM with Dowel  - 1 x daily - 5 x weekly - 3 sets - 10 reps - Supine Shoulder Flexion Overhead with Dowel  - 1 x daily - 5 x weekly - 3 sets - 10 reps - Supine Shoulder Press with Dowel  - 1 x daily - 5 x weekly - 3 sets - 10 reps - Supine Shoulder Abduction AAROM with Dowel  - 1 x daily - 5 x weekly - 3 sets - 10 reps - Supine Shoulder Flexion AAROM with Dowel  - 1 x daily - 5 x weekly - 3 sets - 10 reps - Shoulder extension with resistance - Neutral  - 1 x daily - 5 x weekly - 3 sets - 10 reps - Standing Tricep Extensions with Resistance  - 1 x daily - 5 x weekly - 3 sets -  10 reps - Standing Single Arm Bicep Curls Supinated with Dumbbell  - 1 x daily - 5 x weekly - 3 sets - 10 reps  ASSESSMENT:  CLINICAL IMPRESSION: Tx. Focused on L shoulder AA/PROM in pain tolerable range and progression of L shoulder functional reaching/ strengthening. Pt. Remains limited by joint stiffness/ pain.  Pt. Did well with B cable exercises, specifically rowing. Pt. Marked improvement with neutral grip rowing by demonstrating neutral head and retracted scapular positioning without PT cues.  L shoulder fatigue after 2 minutes of static shoulder flexion at 60 degrees.  Pt. Will benefit from skilled PT services to increase L shoulder ROM/ strength to promote return to PLOF/ mobility with SPC.       OBJECTIVE IMPAIRMENTS: decreased activity tolerance, decreased endurance, decreased ROM, decreased strength, hypomobility, impaired flexibility, impaired sensation, improper body mechanics, postural dysfunction, and pain.   ACTIVITY  LIMITATIONS: carrying, lifting, bathing, toileting, dressing, self feeding, reach over head, and hygiene/grooming  PARTICIPATION LIMITATIONS: meal prep, cleaning, laundry, and shopping  PERSONAL FACTORS: Fitness and Past/current experiences are also affecting patient's functional outcome.   REHAB POTENTIAL: Good  CLINICAL DECISION MAKING: Stable/uncomplicated  EVALUATION COMPLEXITY: Moderate   GOALS: Goals reviewed with patient? Yes  LONG TERM GOALS: Target date: 01/15/24  Pt. Able to complete HEP with no increase c/o L shoulder pain to improve L sh. Joint mobility/ stability.   Baseline:  Goal status: Partially met  2.  Pt. Will increase L shoulder AROM to >90 deg. Flexion to improve management of hair/ feeding.   Baseline: no L shoulder AROM at this time.   2/24:  pt. Challenged managing hair with compensatory movement patterns.   Goal status: Not met  3.  Pt. Able to ambulate with use of SPC on L with consistent recip. Gait pattern and on c/o L shoulder pain to improve household mobility.   Baseline: Pt. Unable to use L shoulder/ SPC at this time.  Goal status: Partially met  4.  Pt. Will be able to dress/ groom independently with no shoulder pain/ limitations.    Baseline: limited with use of L shoulder during dressing  Goal status: Initial  PLAN:  PT FREQUENCY: 2x/week  PT DURATION: 4 weeks  PLANNED INTERVENTIONS: 97110-Therapeutic exercises, 97530- Therapeutic activity, 97112- Neuromuscular re-education, 97535- Self Care, 09811- Manual therapy, Patient/Family education, Joint mobilization, DME instructions, Cryotherapy, and Moist heat  PLAN FOR NEXT SESSION:  Progress nautilus exercises to both seated and standing positions.   Cammie Mcgee, PT, DPT # [phone] 12/18/2023, 1:35 PM   [place] Cage, SPT  [place] Cage, SPT

## 2023-12-25 ENCOUNTER — Encounter: Payer: Self-pay | Admitting: Physical Therapy

## 2023-12-25 ENCOUNTER — Ambulatory Visit: Payer: Medicare Other | Attending: Orthopedic Surgery | Admitting: Physical Therapy

## 2023-12-25 DIAGNOSIS — M6281 Muscle weakness (generalized): Secondary | ICD-10-CM | POA: Diagnosis present

## 2023-12-25 DIAGNOSIS — M25512 Pain in left shoulder: Secondary | ICD-10-CM | POA: Insufficient documentation

## 2023-12-25 DIAGNOSIS — M25612 Stiffness of left shoulder, not elsewhere classified: Secondary | ICD-10-CM | POA: Insufficient documentation

## 2023-12-25 NOTE — Therapy (Signed)
 OUTPATIENT PHYSICAL THERAPY SHOULDER TREATMENT  Patient Name: Evelyn Cisneros MRN: [id number] DOB:Sep 10, 1941, 83 y.o., female Today's Date: 12/25/2023  END OF SESSION:  PT End of Session - 12/25/23 1018     Visit Number 12    Number of Visits 19    Date for PT Re-Evaluation 01/15/24    PT Start Time 1018    PT Stop Time 1106    PT Time Calculation (min) 48 min    Behavior During Therapy --             Past Medical History:  Diagnosis Date   Anemia    Arthritis    Heart murmur    Hypertension    Hypothyroidism    Spondylolisthesis of lumbar region    Past Surgical History:  Procedure Laterality Date   ABDOMINAL HYSTERECTOMY     REPLACEMENT TOTAL KNEE BILATERAL     SPINE SURGERY     Patient Active Problem List   Diagnosis Date Noted   Spondylolisthesis of lumbar region 05/04/2018   Abnormal glucose level 02/12/2018   Abnormal weight gain 02/12/2018   Allergic rhinitis 02/12/2018   Asthma 02/12/2018   Bradycardia 02/12/2018   Edema 02/12/2018   Heart murmur 02/12/2018   Hypercalcemia 02/12/2018   Hypokalemia 02/12/2018   Pure hypercholesterolemia 02/12/2018   Shoulder joint pain 02/12/2018   Skin sensation disturbance 02/12/2018   Syncope 02/12/2018   Vitamin D deficiency 02/12/2018   Central cervical cord injury, 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, 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) 04/24/2017   Muscle spasticity 04/24/2017   Weight loss 12/19/2016   Hypothyroidism 11/24/2016   Bilateral leg edema 11/24/2016   Cervical radiculopathy 11/24/2016   Disc degeneration, lumbar 11/24/2016   Essential hypertension 11/24/2016   Gallstones 11/24/2016   History of anxiety state 11/24/2016   Hyperlipidemia, unspecified 11/24/2016   Spinal stenosis of lumbar region with neurogenic claudication 11/08/2016   Anxiety 06/08/2015   PCP: Verner Mould, MD  REFERRING PROVIDER: Michell Heinrich, PA-C  REFERRING DIAG: [phone] (ICD-10-CM) - Anterior dislocation of left humerus   THERAPY DIAG:   Post-traumatic stiffness of left shoulder joint  Acute pain of left shoulder  Muscle weakness (generalized)  Rationale for Evaluation and Treatment: Rehabilitation  ONSET DATE: 10/20/23  SUBJECTIVE:                                                                                                                                                                                      SUBJECTIVE STATEMENT: Pt. Reports falling while pushing shopping cart in parking lot at [employer].  Pt. Hit head resulting in lacerations/ stitches and L anterior shoulder dislocation.  Pt. Reports 0/10 L shoulder pain  at rest and 2/10 pain with eating/brushing teeth.  Pt. Was using SPC on L prior to fall but limited due to shoulder sling.  Pt. Entered PT in w/c today due to inability to use L UE at this time.  Hand dominance: Left  PERTINENT HISTORY: Subjective   Evelyn Cisneros is a 83 y.o. female in today for: HPI: History of Present Illness  Patient here today for suture removal. She underwent lac repair of her forehead after a fall on concrete 12/30. Repaired 2 lacs with 2 interrupted sutures and 6 running locked sutures.   Today, patient reports healing well. She has had a nodule come up on her left wrist. It is mildly tender to palpation, does not bother her otherwise.   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 90 tablet 3  atorvastatin (LIPITOR) 40 MG tablet TAKE 1 TABLET(40 MG) BY MOUTH EVERY DAY 90 tablet 3  biotin 5 mg Tab Take by mouth  busPIRone (BUSPAR) 5 MG tablet Take 1 tablet (5 mg total) by mouth 2 (two) times daily as needed 30 tablet 5  calcium lactate 100 mg calcium Tab Take by mouth  cholecalciferol, vitamin D3, (VITAMIN D3) 125 mcg (5,000 unit) Tab Take 1 tablet by mouth every morning 90 tablet 3  co-enzyme Q-10, ubiquinone, 200 mg capsule Take 200 mg by mouth every morning.   cyanocobalamin (VITAMIN B12) 1,000 mcg SL tablet Take by mouth  ferrous sulfate 325 (65 FE) MG tablet Take 1 tablet (325 mg total) by mouth twice a week 90 tablet 3  FUROsemide (LASIX) 20 MG tablet Take 1-2 tablets (20-40 mg total) by mouth once daily as needed for Edema 60 tablet 4  gabapentin (NEURONTIN) 300 MG capsule TAKE ONE CAPSULE BY MOUTH THREE TIMES DAILY TO FOUR TIMES DAILY AS NEEDED FOR PAIN 360 capsule 3  Lactobacillus acidophilus (PROBIOTIC ORAL) Take by mouth once daily  levothyroxine (SYNTHROID) 50 MCG tablet Take 1.5 tablets (75 mcg total) by mouth once daily 135 tablet 3  lidocaine (LIDODERM) 5 % patch as needed  losartan (COZAAR) 100 MG tablet TAKE 1 TABLET(100 MG) BY MOUTH EVERY DAY 90 tablet 3  MAGNESIUM ORAL Take by mouth  VIT C/VIT E AC/LUT/COPPER/ZINC (PRESERVISION LUTEIN ORAL) Take 1 tablet by mouth every morning. Last dose one week prior to procedure  ZINC ORAL Take 50 mg by mouth once daily   No facility-administered medications prior to visit.   Objective   Vitals:  10/30/23 1350  PainSc: 0-No pain   There is no height or weight on file to calculate BMI. Home Vitals:  Physical Exam Constitutional:  General: She is not in acute distress. Appearance: Normal appearance.  HENT:  Head: Normocephalic and atraumatic.  Eyes:  General: No scleral icterus. Extraocular Movements: Extraocular movements intact.   Conjunctiva/sclera: Conjunctivae normal.  Cardiovascular:  Rate and Rhythm: Normal rate and regular rhythm.  Pulmonary:  Effort: Pulmonary effort is normal. No respiratory distress.  Breath sounds: Normal breath sounds.  Musculoskeletal:  General: Normal range of motion.  Cervical back: Normal range of motion and neck supple.  Comments: Head lacerations with good interval healing. All sutures visualized for removal.   Left hand with significant bruising from PIPs to upper wrist, interval resolution present. There is a cystic lesion, nonmobile, mildly tender to manipulation present in her posterior central wrist.  Skin: General: Skin is warm and dry.  Coloration: Skin is not jaundiced.  Findings: No erythema or rash.  Neurological:  General: No focal deficit present.  Mental Status: She is alert.  Cranial Nerves: No cranial nerve deficit.  Coordination: Coordination normal.   Assessment & Plan  Diagnoses and all orders for this visit:  Visit for suture removal - Running central scalp suture and 2x interrupted suture of right scalp removed without difficulty. Area clean with good interval healing to wound.  - Recommend gentle washing with soap and water. Avoid aggressive scrubbing. Keep area clean and dry. Reviewed warning s/s for wound healing or new onset infection requiring further evaluation.  Wrist lesion - Possible ganglion cyst vs healing blister pocket, difficult to evaluate fully with overall bruising and injury from her recent fall. Reviewed possible diagnoses. If pain, worsening swelling, numbness/tingling or movement limitations recommend earlier evaluation. Otherwise recommend evaluation by orthopedics at her follow up if symptoms do not resolve with the rest of her injuries.   PAIN:  Are you having pain? Yes: NPRS scale: 2/10 Pain location: L shoulder Pain description: sharp/aching Aggravating factors: movement/ brushing teeth Relieving factors: rest/ use of  sling  PRECAUTIONS: Shoulder  RED FLAGS: None   WEIGHT BEARING RESTRICTIONS: No  FALLS:  Has patient fallen in last 6 months? Yes. Number of falls 2  LIVING ENVIRONMENT: Lives with: lives with their spouse Lives in: House/apartment Has following equipment at home: Single point cane and Wheelchair (manual)  OCCUPATION: Retired  PLOF: Independent with household mobility with device  PATIENT GOALS:  Increase L shoulder AROM/ strength/ pain-free mobility.    NEXT MD VISIT: 11/14/23 with Dr. Franco Collet (ortho MD)  OBJECTIVE:  Note: Objective measures were completed at Evaluation unless otherwise noted.  DIAGNOSTIC FINDINGS:  See imaging  PATIENT SURVEYS:  FOTO initial 32/ goal 73   COGNITION: Overall cognitive status: Within functional limits for tasks assessed     SENSATION: WFL.  Moderate L hand bruising.    POSTURE: Rounded shoulders/ forward posture.  Use of L shoulder sling.    UPPER EXTREMITY ROM:  Full tear of R supraspinatus reported.    Passive ROM Right Eval AROM Left Eval PROM  Shoulder flexion 74 deg. 53 deg.  Shoulder extension    Shoulder abduction 70 deg. 42 deg.  Shoulder adduction    Shoulder internal rotation  [facility]  Shoulder external rotation  0 deg.  Elbow flexion [facility] WFL  Elbow extension [facility] WFL  Wrist flexion    Wrist extension    Wrist ulnar deviation    Wrist radial deviation    Wrist pronation    Wrist supination    (Blank rows = not tested)  UPPER EXTREMITY MMT: No MMT testing today.    SHOULDER SPECIAL TESTS: NT  JOINT MOBILITY TESTING:  No L shoulder joint mobs. Secondary to anterior dislocation.    PALPATION:  Generalized L hand tenderness/ bruising.  TREATMENT DATE: 12/25/2023  Subjective: Pt. Reports she is using B UE a lot during the week.  No new complaints.   Pts. Husband is scheduled for back  surgery in >1 week and pt. Will have to miss PT for 1-2 weeks.    There.ex.:  Seated shoulder pulley (flexion/ scaption/ abduction)- 20x each.  Warm-up.    Seated/ supine STM to L and R shoulder and UT musculature during sh. ROM reassessment.  Moderate posterior deltoid, bicep/ tricep musculature tenderness.    Seated trunk rotn. With added UE reaching across body (no resistance)- OP provided.    Supine L serratus punches 10x2 with cuing for proper technique.    L shoulder flexion/extension with light manual resistance 10x (increase L shoulder crepitus noted).    There.act.:  Standing at elevated mat table with yellow ball (reaching/ cross-body).  Walking in clinic with instruction in R arm swing with recip. Gait pattern/ use of SPC on L.  Pt. Has difficulty/ guarded R shoulder movement during gait.  Limited sequencing  NOT TODAY Standing Blaze pods at table top:  6 pods with use of 1# dumbbells (Focus set-up).  Attempted 2# wt. For initial attempt (challenged).    Nautilus: AAROM/min. Assist with all movements.  Seated lat pull down 20# 2x15  Seated mid row 20# 2x15 Seated neutral grip low row 20# 2x15   PATIENT EDUCATION: Education details: Reviewed HEP Person educated: Patient and Spouse Education method: Explanation and Demonstration Education comprehension: verbalized understanding and returned demonstration  HOME EXERCISE PROGRAM: Access Code: [email or web address] URL: [email or web address] Date: 11/06/2023 Prepared by: Dorene Grebe  Exercises - Seated Scapular Retraction  - 1 x daily - 7 x weekly - 2 sets - 10 reps - Standing Isometric Shoulder External Rotation with Doorway  - 1 x daily - 7 x weekly - 2 sets - 10 reps - 5-10sec hold - Isometric Shoulder Abduction at Wall  - 1 x daily - 7 x weekly - 2 sets - 10 reps - 5-10 sec hold - Isometric Shoulder Flexion at Wall  - 1 x daily - 7 x weekly - 2 sets - 10 reps - 5-10 sec hold   Access Code: [email or web address] URL:  [email or web address] Date: 11/18/2023 Prepared by: Dorene Grebe  Exercises - Seated Scapular Retraction  - 1 x daily - 7 x weekly - 2 sets - 10 reps - Supine Shoulder External Rotation AAROM with Dowel  - 1 x daily - 7 x weekly - 3 sets - 10 reps - Supine Shoulder Flexion Overhead with Dowel  - 1 x daily - 7 x weekly - 3 sets - 10 reps - Supine Shoulder Press with Dowel  - 1 x daily - 7 x weekly - 3 sets - 10 reps - Supine Shoulder Abduction AAROM with Dowel  - 1 x daily - 7 x weekly - 3 sets - 10 reps - Supine Shoulder Flexion AAROM with Dowel  - 1 x daily - 7 x weekly - 3 sets - 10 reps  Access Code: [email or web address] URL: [email or web address] Date: 12/02/2023 Prepared by: Dorene Grebe  Exercises - Supine Shoulder External Rotation AAROM with Dowel  - 1 x daily - 5 x weekly - 3 sets - 10 reps - Supine Shoulder Flexion Overhead with Dowel  - 1 x daily - 5 x weekly - 3 sets - 10 reps - Supine Shoulder Press with Dowel  - 1 x daily - 5 x weekly - 3 sets - 10 reps - Supine Shoulder Abduction AAROM  with Dowel  - 1 x daily - 5 x weekly - 3 sets - 10 reps - Supine Shoulder Flexion AAROM with Dowel  - 1 x daily - 5 x weekly - 3 sets - 10 reps - Shoulder extension with resistance - Neutral  - 1 x daily - 5 x weekly - 3 sets - 10 reps - Standing Tricep Extensions with Resistance  - 1 x daily - 5 x weekly - 3 sets - 10 reps - Standing Single Arm Bicep Curls Supinated with Dumbbell  - 1 x daily - 5 x weekly - 3 sets - 10 reps  ASSESSMENT:  CLINICAL IMPRESSION: Tx. Focused on L shoulder AA/PROM in pain tolerable range and progression of L shoulder functional reaching/ strengthening. Pt. Remains limited by joint stiffness/ pain.  L shoulder fatigue with repeated reaching/ table top ex. at 60 degrees of flexion.  Limited to no R arm swing during gait with use of SPC and recip. Gait pattern.  Pt. Will benefit from skilled PT services to increase L shoulder ROM/ strength to  promote return to PLOF/ mobility with SPC.       OBJECTIVE IMPAIRMENTS: decreased activity tolerance, decreased endurance, decreased ROM, decreased strength, hypomobility, impaired flexibility, impaired sensation, improper body mechanics, postural dysfunction, and pain.   ACTIVITY LIMITATIONS: carrying, lifting, bathing, toileting, dressing, self feeding, reach over head, and hygiene/grooming  PARTICIPATION LIMITATIONS: meal prep, cleaning, laundry, and shopping  PERSONAL FACTORS: Fitness and Past/current experiences are also affecting patient's functional outcome.   REHAB POTENTIAL: Good  CLINICAL DECISION MAKING: Stable/uncomplicated  EVALUATION COMPLEXITY: Moderate   GOALS: Goals reviewed with patient? Yes  LONG TERM GOALS: Target date: 01/15/24  Pt. Able to complete HEP with no increase c/o L shoulder pain to improve L sh. Joint mobility/ stability.   Baseline:  Goal status: Partially met  2.  Pt. Will increase L shoulder AROM to >90 deg. Flexion to improve management of hair/ feeding.   Baseline: no L shoulder AROM at this time.   2/24:  pt. Challenged managing hair with compensatory movement patterns.   Goal status: Not met  3.  Pt. Able to ambulate with use of SPC on L with consistent recip. Gait pattern and on c/o L shoulder pain to improve household mobility.   Baseline: Pt. Unable to use L shoulder/ SPC at this time.  Goal status: Partially met  4.  Pt. Will be able to dress/ groom independently with no shoulder pain/ limitations.    Baseline: limited with use of L shoulder during dressing  Goal status: Initial  PLAN:  PT FREQUENCY: 2x/week  PT DURATION: 4 weeks  PLANNED INTERVENTIONS: 97110-Therapeutic exercises, 97530- Therapeutic activity, 97112- Neuromuscular re-education, 97535- Self Care, 40347- Manual therapy, Patient/Family education, Joint mobilization, DME instructions, Cryotherapy, and Moist heat  PLAN FOR NEXT SESSION:  Progress nautilus exercises to  both seated and standing positions.   Cammie Mcgee, PT, DPT # [phone] 12/25/2023, 12:33 PM

## 2023-12-30 ENCOUNTER — Ambulatory Visit: Payer: Medicare Other | Admitting: Physical Therapy

## 2023-12-30 DIAGNOSIS — M25612 Stiffness of left shoulder, not elsewhere classified: Secondary | ICD-10-CM | POA: Diagnosis not present

## 2023-12-30 DIAGNOSIS — M25512 Pain in left shoulder: Secondary | ICD-10-CM

## 2023-12-30 DIAGNOSIS — M6281 Muscle weakness (generalized): Secondary | ICD-10-CM

## 2023-12-30 NOTE — Therapy (Signed)
 OUTPATIENT PHYSICAL THERAPY SHOULDER TREATMENT  Patient Name: Stesha Neyens MRN: [id number] DOB:07-09-1941, 83 y.o., female Today's Date: 12/30/2023  END OF SESSION:  PT End of Session - 12/30/23 1007     Visit Number 13    Number of Visits 19    Date for PT Re-Evaluation 01/15/24    PT Start Time 1007    PT Stop Time 1101    PT Time Calculation (min) 54 min            Past Medical History:  Diagnosis Date   Anemia    Arthritis    Heart murmur    Hypertension    Hypothyroidism    Spondylolisthesis of lumbar region    Past Surgical History:  Procedure Laterality Date   ABDOMINAL HYSTERECTOMY     REPLACEMENT TOTAL KNEE BILATERAL     SPINE SURGERY     Patient Active Problem List   Diagnosis Date Noted   Spondylolisthesis of lumbar region 05/04/2018   Abnormal glucose level 02/12/2018   Abnormal weight gain 02/12/2018   Allergic rhinitis 02/12/2018   Asthma 02/12/2018   Bradycardia 02/12/2018   Edema 02/12/2018   Heart murmur 02/12/2018   Hypercalcemia 02/12/2018   Hypokalemia 02/12/2018   Pure hypercholesterolemia 02/12/2018   Shoulder joint pain 02/12/2018   Skin sensation disturbance 02/12/2018   Syncope 02/12/2018   Vitamin D deficiency 02/12/2018   Central cervical cord injury, 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, 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) 04/24/2017   Muscle spasticity 04/24/2017   Weight loss 12/19/2016   Hypothyroidism 11/24/2016   Bilateral leg edema 11/24/2016   Cervical radiculopathy 11/24/2016   Disc degeneration, lumbar 11/24/2016   Essential hypertension 11/24/2016   Gallstones 11/24/2016   History of anxiety state 11/24/2016   Hyperlipidemia, unspecified 11/24/2016   Spinal stenosis of lumbar region with neurogenic claudication 11/08/2016   Anxiety 06/08/2015   PCP: Verner Mould, MD  REFERRING PROVIDER: Michell Heinrich, PA-C  REFERRING DIAG: [phone] (ICD-10-CM) - Anterior dislocation of left humerus   THERAPY DIAG:  Post-traumatic stiffness of left  shoulder joint  Acute pain of left shoulder  Muscle weakness (generalized)  Rationale for Evaluation and Treatment: Rehabilitation  ONSET DATE: 10/20/23  SUBJECTIVE:                                                                                                                                                                                      SUBJECTIVE STATEMENT: Pt. Reports falling while pushing shopping cart in parking lot at [place].  Pt. Hit head resulting in lacerations/ stitches and L anterior shoulder dislocation.  Pt. Reports 0/10 L shoulder pain at rest and 2/10 pain with eating/brushing teeth.  Pt. Was using SPC on L prior to fall but limited due to shoulder sling.  Pt. Entered PT in w/c today due to inability to use L UE at this time.  Hand dominance: Left  PERTINENT HISTORY: Subjective   Anvitha Hutmacher is a 83 y.o. female in today for: HPI: History of Present Illness  Patient here today for suture removal. She underwent lac repair of her forehead after a fall on concrete 12/30. Repaired 2 lacs with 2 interrupted sutures and 6 running locked sutures.   Today, patient reports healing well. She has had a nodule come up on her left wrist. It is mildly tender to palpation, does not bother her otherwise.   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 90 tablet 3  atorvastatin (LIPITOR) 40 MG tablet TAKE 1 TABLET(40 MG) BY MOUTH EVERY DAY 90 tablet 3  biotin 5 mg Tab Take by mouth  busPIRone (BUSPAR) 5 MG tablet Take 1 tablet (5 mg total) by mouth 2 (two) times daily as needed 30 tablet 5  calcium lactate 100 mg calcium Tab Take by mouth  cholecalciferol, vitamin D3, (VITAMIN D3) 125 mcg (5,000 unit) Tab Take 1 tablet by mouth every morning 90 tablet 3  co-enzyme Q-10, ubiquinone, 200 mg capsule Take 200 mg by mouth every morning.   cyanocobalamin (VITAMIN B12) 1,000 mcg SL tablet Take by mouth  ferrous sulfate 325 (65 FE) MG tablet Take 1 tablet (325 mg total) by mouth twice a week 90 tablet 3  FUROsemide (LASIX) 20 MG tablet Take 1-2 tablets (20-40 mg total) by mouth once daily as needed for Edema 60 tablet 4  gabapentin (NEURONTIN) 300 MG capsule TAKE ONE CAPSULE BY MOUTH THREE TIMES DAILY TO FOUR TIMES DAILY AS NEEDED FOR PAIN 360 capsule 3  Lactobacillus acidophilus (PROBIOTIC ORAL) Take by mouth once daily  levothyroxine (SYNTHROID) 50 MCG tablet Take 1.5 tablets (75 mcg total) by mouth once daily 135 tablet 3  lidocaine (LIDODERM) 5 % patch as needed  losartan (COZAAR) 100 MG tablet TAKE 1 TABLET(100 MG) BY MOUTH EVERY DAY 90 tablet 3  MAGNESIUM ORAL Take by mouth  VIT C/VIT E AC/LUT/COPPER/ZINC (PRESERVISION LUTEIN ORAL) Take 1 tablet by mouth every morning. Last dose one week prior to procedure  ZINC ORAL Take 50 mg by mouth once daily   No facility-administered medications prior to visit.   Objective   Vitals:  10/30/23 1350  PainSc: 0-No pain   There is no height or weight on file to calculate BMI. Home Vitals:  Physical Exam Constitutional:  General: She is not in acute distress. Appearance: Normal appearance.  HENT:  Head: Normocephalic and atraumatic.  Eyes:  General: No scleral icterus. Extraocular Movements: Extraocular movements intact.  Conjunctiva/sclera: Conjunctivae normal.   Cardiovascular:  Rate and Rhythm: Normal rate and regular rhythm.  Pulmonary:  Effort: Pulmonary effort is normal. No respiratory distress.  Breath sounds: Normal breath sounds.  Musculoskeletal:  General: Normal range of motion.  Cervical back: Normal range of motion and neck supple.  Comments: Head lacerations with good interval healing. All sutures visualized for removal.   Left hand with significant bruising from PIPs to upper wrist, interval resolution present. There is a cystic lesion, nonmobile, mildly tender to manipulation present in her posterior central wrist.  Skin: General: Skin is warm and dry.  Coloration: Skin is not jaundiced.  Findings: No erythema or rash.  Neurological:  General: No focal deficit present.  Mental Status: She is alert.  Cranial Nerves: No cranial nerve deficit.  Coordination: Coordination normal.   Assessment & Plan  Diagnoses and all orders for this visit:  Visit for suture removal - Running central scalp suture and 2x interrupted suture of right scalp removed without difficulty. Area clean with good interval healing to wound.  - Recommend gentle washing with soap and water. Avoid aggressive scrubbing. Keep area clean and dry. Reviewed warning s/s for wound healing or new onset infection requiring further evaluation.  Wrist lesion - Possible ganglion cyst vs healing blister pocket, difficult to evaluate fully with overall bruising and injury from her recent fall. Reviewed possible diagnoses. If pain, worsening swelling, numbness/tingling or movement limitations recommend earlier evaluation. Otherwise recommend evaluation by orthopedics at her follow up if symptoms do not resolve with the rest of her injuries.   PAIN:  Are you having pain? Yes: NPRS scale: 2/10 Pain location: L shoulder Pain description: sharp/aching Aggravating factors: movement/ brushing teeth Relieving factors: rest/ use of sling  PRECAUTIONS: Shoulder  RED  FLAGS: None   WEIGHT BEARING RESTRICTIONS: No  FALLS:  Has patient fallen in last 6 months? Yes. Number of falls 2  LIVING ENVIRONMENT: Lives with: lives with their spouse Lives in: House/apartment Has following equipment at home: Single point cane and Wheelchair (manual)  OCCUPATION: Retired  PLOF: Independent with household mobility with device  PATIENT GOALS:  Increase L shoulder AROM/ strength/ pain-free mobility.    NEXT MD VISIT: 11/14/23 with Dr. Franco Collet (ortho MD)  OBJECTIVE:  Note: Objective measures were completed at Evaluation unless otherwise noted.  DIAGNOSTIC FINDINGS:  See imaging  PATIENT SURVEYS:  FOTO initial 32/ goal 51   COGNITION: Overall cognitive status: Within functional limits for tasks assessed     SENSATION: WFL.  Moderate L hand bruising.    POSTURE: Rounded shoulders/ forward posture.  Use of L shoulder sling.    UPPER EXTREMITY ROM:  Full tear of R supraspinatus reported.    Passive ROM Right Eval AROM Left Eval PROM  Shoulder flexion 74 deg. 53 deg.  Shoulder extension    Shoulder abduction 70 deg. 42 deg.  Shoulder adduction    Shoulder internal rotation  [facility]  Shoulder external rotation  0 deg.  Elbow flexion [facility] WFL  Elbow extension [facility] WFL  Wrist flexion    Wrist extension    Wrist ulnar deviation    Wrist radial deviation    Wrist pronation    Wrist supination    (Blank rows = not tested)  UPPER EXTREMITY MMT: No MMT testing today.    SHOULDER SPECIAL TESTS: NT  JOINT MOBILITY TESTING:  No L shoulder joint mobs. Secondary to anterior dislocation.    PALPATION:  Generalized L hand tenderness/ bruising.  TREATMENT DATE: 12/30/2023  Subjective:  No new complaints.  Pts. Husband is scheduled for back surgery next Monday and pt. Will have to miss PT for 1-2 weeks.  Reviewed HEP.  There.ex.:  Seated  shoulder pulley (flexion/ scaption/ abduction)- 20x each.  Warm-up.    Seated L shoulder AA/PROM flexion/ scaption/ punches 5x2.  Cuing to hold L shoulder >90 deg. With PT assist for UE control.      Standing wall ladder with R UE 3x (marked with balloon sticker).  L UE wall ladder 2x (limited secondary to fingers).      2# seated supination/ pronation/ biceps.    Seated STM to L and R shoulder and UT musculature during sh. ROM reassessment.  Moderate posterior deltoid, bicep/ tricep musculature tenderness.  Instructed pts. Husband in STM.  Attempted use of Hypervolt but pt. Too sensitive/ tender with use.    There.act.:  Standing at elevated mat table with white ball (reaching/ cross-body).  Elevated table so pt. At 90 deg. Shoulder flexion.  Standing shoulder shrugs/ scapular retraction  Walking in clinic with use of SPC.     NOT TODAY Standing Blaze pods at table top:  6 pods with use of 1# dumbbells (Focus set-up).  Attempted 2# wt. For initial attempt (challenged).    Nautilus: AAROM/min. Assist with all movements.  Seated lat pull down 20# 2x15  Seated mid row 20# 2x15 Seated neutral grip low row 20# 2x15   PATIENT EDUCATION: Education details: Reviewed HEP Person educated: Patient and Spouse Education method: Explanation and Demonstration Education comprehension: verbalized understanding and returned demonstration  HOME EXERCISE PROGRAM: Access Code: [email or web address] URL: [email or web address] Date: 11/06/2023 Prepared by: Dorene Grebe  Exercises - Seated Scapular Retraction  - 1 x daily - 7 x weekly - 2 sets - 10 reps - Standing Isometric Shoulder External Rotation with Doorway  - 1 x daily - 7 x weekly - 2 sets - 10 reps - 5-10sec hold - Isometric Shoulder Abduction at Wall  - 1 x daily - 7 x weekly - 2 sets - 10 reps - 5-10 sec hold - Isometric Shoulder Flexion at Wall  - 1 x daily - 7 x weekly - 2 sets - 10 reps - 5-10 sec hold   Access Code:  [email or web address] URL: [email or web address] Date: 11/18/2023 Prepared by: Dorene Grebe  Exercises - Seated Scapular Retraction  - 1 x daily - 7 x weekly - 2 sets - 10 reps - Supine Shoulder External Rotation AAROM with Dowel  - 1 x daily - 7 x weekly - 3 sets - 10 reps - Supine Shoulder Flexion Overhead with Dowel  - 1 x daily - 7 x weekly - 3 sets - 10 reps - Supine Shoulder Press with Dowel  - 1 x daily - 7 x weekly - 3 sets - 10 reps - Supine Shoulder Abduction AAROM with Dowel  - 1 x daily - 7 x weekly - 3 sets - 10 reps - Supine Shoulder Flexion AAROM with Dowel  - 1 x daily - 7 x weekly - 3 sets - 10 reps  Access Code: [email or web address] URL: [email or web address] Date: 12/02/2023 Prepared by: Dorene Grebe  Exercises - Supine Shoulder External Rotation AAROM with Dowel  - 1 x daily - 5 x weekly - 3 sets - 10 reps - Supine Shoulder Flexion Overhead with Dowel  - 1 x daily - 5 x weekly - 3 sets - 10 reps - Supine Shoulder Press with Dowel  - 1 x daily -  5 x weekly - 3 sets - 10 reps - Supine Shoulder Abduction AAROM with Dowel  - 1 x daily - 5 x weekly - 3 sets - 10 reps - Supine Shoulder Flexion AAROM with Dowel  - 1 x daily - 5 x weekly - 3 sets - 10 reps - Shoulder extension with resistance - Neutral  - 1 x daily - 5 x weekly - 3 sets - 10 reps - Standing Tricep Extensions with Resistance  - 1 x daily - 5 x weekly - 3 sets - 10 reps - Standing Single Arm Bicep Curls Supinated with Dumbbell  - 1 x daily - 5 x weekly - 3 sets - 10 reps  ASSESSMENT:  CLINICAL IMPRESSION: Tx. Focused on L shoulder AA/PROM in pain tolerable range and progression of L shoulder functional reaching/ strengthening. Pt. Remains limited by joint stiffness/ pain.  L shoulder fatigue with repeated reaching/ table top ex. at 60 degrees of flexion.  Limited to no R arm swing during gait with use of SPC and recip. Gait pattern.  Pt. Will benefit from skilled PT services to increase L shoulder ROM/  strength to promote return to PLOF/ mobility with SPC.       OBJECTIVE IMPAIRMENTS: decreased activity tolerance, decreased endurance, decreased ROM, decreased strength, hypomobility, impaired flexibility, impaired sensation, improper body mechanics, postural dysfunction, and pain.   ACTIVITY LIMITATIONS: carrying, lifting, bathing, toileting, dressing, self feeding, reach over head, and hygiene/grooming  PARTICIPATION LIMITATIONS: meal prep, cleaning, laundry, and shopping  PERSONAL FACTORS: Fitness and Past/current experiences are also affecting patient's functional outcome.   REHAB POTENTIAL: Good  CLINICAL DECISION MAKING: Stable/uncomplicated  EVALUATION COMPLEXITY: Moderate   GOALS: Goals reviewed with patient? Yes  LONG TERM GOALS: Target date: 01/15/24  Pt. Able to complete HEP with no increase c/o L shoulder pain to improve L sh. Joint mobility/ stability.   Baseline:  Goal status: Partially met  2.  Pt. Will increase L shoulder AROM to >90 deg. Flexion to improve management of hair/ feeding.   Baseline: no L shoulder AROM at this time.   2/24:  pt. Challenged managing hair with compensatory movement patterns.   Goal status: Not met  3.  Pt. Able to ambulate with use of SPC on L with consistent recip. Gait pattern and on c/o L shoulder pain to improve household mobility.   Baseline: Pt. Unable to use L shoulder/ SPC at this time.  Goal status: Partially met  4.  Pt. Will be able to dress/ groom independently with no shoulder pain/ limitations.    Baseline: limited with use of L shoulder during dressing  Goal status: Initial  PLAN:  PT FREQUENCY: 2x/week  PT DURATION: 4 weeks  PLANNED INTERVENTIONS: 97110-Therapeutic exercises, 97530- Therapeutic activity, 97112- Neuromuscular re-education, 97535- Self Care, 11914- Manual therapy, Patient/Family education, Joint mobilization, DME instructions, Cryotherapy, and Moist heat  PLAN FOR NEXT SESSION:  Progress nautilus  exercises to both seated and standing positions.   Cammie Mcgee, PT, DPT # [phone] 12/30/2023, 1:21 PM

## 2024-01-01 ENCOUNTER — Ambulatory Visit: Payer: Medicare Other | Admitting: Physical Therapy

## 2024-01-14 ENCOUNTER — Encounter: Admitting: Physical Therapy

## 2024-01-21 ENCOUNTER — Ambulatory Visit: Admitting: Physical Therapy

## 2024-01-28 ENCOUNTER — Encounter: Admitting: Physical Therapy

## 2024-02-03 ENCOUNTER — Encounter: Payer: Self-pay | Admitting: Physical Therapy

## 2024-02-03 ENCOUNTER — Ambulatory Visit: Attending: Orthopedic Surgery | Admitting: Physical Therapy

## 2024-02-03 DIAGNOSIS — M25512 Pain in left shoulder: Secondary | ICD-10-CM | POA: Insufficient documentation

## 2024-02-03 DIAGNOSIS — M25612 Stiffness of left shoulder, not elsewhere classified: Secondary | ICD-10-CM | POA: Diagnosis present

## 2024-02-03 DIAGNOSIS — M6281 Muscle weakness (generalized): Secondary | ICD-10-CM | POA: Insufficient documentation

## 2024-02-03 NOTE — Therapy (Signed)
 OUTPATIENT PHYSICAL THERAPY SHOULDER TREATMENT/ RECERTIFICATION  Patient Name: Evelyn Cisneros MRN: [id number] DOB:11/06/1940, 83 y.o., female Today's Date: 02/03/2024  END OF SESSION:  PT End of Session - 02/03/24 1106     Visit Number 14    Number of Visits 22    Date for PT Re-Evaluation 03/02/24    PT Start Time 1106    PT Stop Time 1149    PT Time Calculation (min) 43 min            Past Medical History:  Diagnosis Date   Anemia    Arthritis    Heart murmur    Hypertension    Hypothyroidism    Spondylolisthesis of lumbar region    Past Surgical History:  Procedure Laterality Date   ABDOMINAL HYSTERECTOMY     REPLACEMENT TOTAL KNEE BILATERAL     SPINE SURGERY     Patient Active Problem List   Diagnosis Date Noted   Spondylolisthesis of lumbar region 05/04/2018   Abnormal glucose level 02/12/2018   Abnormal weight gain 02/12/2018   Allergic rhinitis 02/12/2018   Asthma 02/12/2018   Bradycardia 02/12/2018   Edema 02/12/2018   Heart murmur 02/12/2018   Hypercalcemia 02/12/2018   Hypokalemia 02/12/2018   Pure hypercholesterolemia 02/12/2018   Shoulder joint pain 02/12/2018   Skin sensation disturbance 02/12/2018   Syncope 02/12/2018   Vitamin D deficiency 02/12/2018   Central cervical cord injury, 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, 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) 04/24/2017   Muscle spasticity 04/24/2017   Weight loss 12/19/2016   Hypothyroidism 11/24/2016   Bilateral leg edema 11/24/2016   Cervical radiculopathy 11/24/2016   Disc degeneration, lumbar 11/24/2016   Essential hypertension 11/24/2016   Gallstones 11/24/2016   History of anxiety state 11/24/2016   Hyperlipidemia, unspecified 11/24/2016   Spinal stenosis of lumbar region with neurogenic claudication 11/08/2016   Anxiety 06/08/2015   PCP: Levell Reach, MD  REFERRING PROVIDER: Neomia Banner, PA-C  REFERRING DIAG: [phone] (ICD-10-CM) - Anterior dislocation of left humerus   THERAPY DIAG:  Post-traumatic  stiffness of left shoulder joint  Acute pain of left shoulder  Muscle weakness (generalized)  Rationale for Evaluation and Treatment: Rehabilitation  ONSET DATE: 10/20/23  SUBJECTIVE:                                                                                                                                                                                      SUBJECTIVE STATEMENT: Pt. Reports falling while pushing shopping cart in parking lot at [employer].  Pt. Hit head resulting in lacerations/ stitches and L anterior shoulder dislocation.  Pt. Reports 0/10 L shoulder pain at rest and 2/10 pain with eating/brushing  teeth.  Pt. Was using SPC on L prior to fall but limited due to shoulder sling.  Pt. Entered PT in w/c today due to inability to use L UE at this time.  Hand dominance: Left  PERTINENT HISTORY: Subjective   Evelyn Cisneros is a 83 y.o. female in today for: HPI: History of Present Illness  Patient here today for suture removal. She underwent lac repair of her forehead after a fall on concrete 12/30. Repaired 2 lacs with 2 interrupted sutures and 6 running locked sutures.   Today, patient reports healing well. She has had a nodule come up on her left wrist. It is mildly tender to palpation, does not bother her otherwise.   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 90 tablet 3  atorvastatin (LIPITOR) 40 MG tablet TAKE 1 TABLET(40 MG) BY MOUTH EVERY DAY 90 tablet 3  biotin 5 mg Tab Take by mouth  busPIRone (BUSPAR) 5 MG tablet Take 1 tablet (5 mg total) by mouth 2 (two) times daily as needed 30 tablet 5  calcium lactate 100 mg calcium Tab Take by mouth  cholecalciferol, vitamin D3, (VITAMIN D3) 125 mcg (5,000 unit) Tab Take 1 tablet by mouth every morning 90 tablet 3  co-enzyme Q-10, ubiquinone, 200 mg capsule Take 200 mg by mouth every morning.   cyanocobalamin (VITAMIN B12) 1,000 mcg SL tablet Take by mouth  ferrous sulfate 325 (65 FE) MG tablet Take 1 tablet (325 mg total) by mouth twice a week 90 tablet 3  FUROsemide (LASIX) 20 MG tablet Take 1-2 tablets (20-40 mg total) by mouth once daily as needed for Edema 60 tablet 4  gabapentin (NEURONTIN) 300 MG capsule TAKE ONE CAPSULE BY MOUTH THREE TIMES DAILY TO FOUR TIMES DAILY AS NEEDED FOR PAIN 360 capsule 3  Lactobacillus acidophilus (PROBIOTIC ORAL) Take by mouth once daily  levothyroxine (SYNTHROID) 50 MCG tablet Take 1.5 tablets (75 mcg total) by mouth once daily 135 tablet 3  lidocaine (LIDODERM) 5 % patch as needed  losartan (COZAAR) 100 MG tablet TAKE 1 TABLET(100 MG) BY MOUTH EVERY DAY 90 tablet 3  MAGNESIUM ORAL Take by mouth  VIT C/VIT E AC/LUT/COPPER/ZINC (PRESERVISION LUTEIN ORAL) Take 1 tablet by mouth every morning. Last dose one week prior to procedure  ZINC ORAL Take 50 mg by mouth once daily   No facility-administered medications prior to visit.   Objective   Vitals:  10/30/23 1350  PainSc: 0-No pain   There is no height or weight on file to calculate BMI. Home Vitals:  Physical Exam Constitutional:  General: She is not in acute distress. Appearance: Normal appearance.  HENT:  Head: Normocephalic and atraumatic.  Eyes:  General: No scleral icterus. Extraocular Movements: Extraocular movements intact.  Conjunctiva/sclera:  Conjunctivae normal.  Cardiovascular:  Rate and Rhythm: Normal rate and regular rhythm.  Pulmonary:  Effort: Pulmonary effort is normal. No  respiratory distress.  Breath sounds: Normal breath sounds.  Musculoskeletal:  General: Normal range of motion.  Cervical back: Normal range of motion and neck supple.  Comments: Head lacerations with good interval healing. All sutures visualized for removal.   Left hand with significant bruising from PIPs to upper wrist, interval resolution present. There is a cystic lesion, nonmobile, mildly tender to manipulation present in her posterior central wrist.  Skin: General: Skin is warm and dry.  Coloration: Skin is not jaundiced.  Findings: No erythema or rash.  Neurological:  General: No focal deficit present.  Mental Status: She is alert.  Cranial Nerves: No cranial nerve deficit.  Coordination: Coordination normal.   Assessment & Plan  Diagnoses and all orders for this visit:  Visit for suture removal - Running central scalp suture and 2x interrupted suture of right scalp removed without difficulty. Area clean with good interval healing to wound.  - Recommend gentle washing with soap and water. Avoid aggressive scrubbing. Keep area clean and dry. Reviewed warning s/s for wound healing or new onset infection requiring further evaluation.  Wrist lesion - Possible ganglion cyst vs healing blister pocket, difficult to evaluate fully with overall bruising and injury from her recent fall. Reviewed possible diagnoses. If pain, worsening swelling, numbness/tingling or movement limitations recommend earlier evaluation. Otherwise recommend evaluation by orthopedics at her follow up if symptoms do not resolve with the rest of her injuries.   PAIN:  Are you having pain? Yes: NPRS scale: 2/10 Pain location: L shoulder Pain description: sharp/aching Aggravating factors: movement/ brushing teeth Relieving factors: rest/ use of sling  PRECAUTIONS:  Shoulder  RED FLAGS: None   WEIGHT BEARING RESTRICTIONS: No  FALLS:  Has patient fallen in last 6 months? Yes. Number of falls 2  LIVING ENVIRONMENT: Lives with: lives with their spouse Lives in: House/apartment Has following equipment at home: Single point cane and Wheelchair (manual)  OCCUPATION: Retired  PLOF: Independent with household mobility with device  PATIENT GOALS:  Increase L shoulder AROM/ strength/ pain-free mobility.    NEXT MD VISIT: 11/14/23 with Dr. Franco Collet (ortho MD)  OBJECTIVE:  Note: Objective measures were completed at Evaluation unless otherwise noted.  DIAGNOSTIC FINDINGS:  See imaging  PATIENT SURVEYS:  FOTO initial 32/ goal 65   COGNITION: Overall cognitive status: Within functional limits for tasks assessed     SENSATION: WFL.  Moderate L hand bruising.    POSTURE: Rounded shoulders/ forward posture.  Use of L shoulder sling.    UPPER EXTREMITY ROM:  Full tear of R supraspinatus reported.    Passive ROM Right Eval AROM Left Eval PROM  Shoulder flexion 74 deg. 53 deg.  Shoulder extension    Shoulder abduction 70 deg. 42 deg.  Shoulder adduction    Shoulder internal rotation  [facility]  Shoulder external rotation  0 deg.  Elbow flexion [facility] WFL  Elbow extension [facility] WFL  Wrist flexion    Wrist extension    Wrist ulnar deviation    Wrist radial deviation    Wrist pronation    Wrist supination    (Blank rows = not tested)  UPPER EXTREMITY MMT: No MMT testing today.    SHOULDER SPECIAL TESTS: NT  JOINT MOBILITY TESTING:  No L shoulder joint mobs. Secondary to anterior dislocation.    PALPATION:  Generalized L hand tenderness/ bruising.  TREATMENT DATE: 02/03/2024  Subjective:  Pt. Has not been seen by PT over past month secondary to pts. Husband having back surgery.  Pt. Benefited from caregiver assist at home and continues  to have assist 2x/week.  Pt. Reports compliance with HEP.  Passive ROM Right seated AROM Left seated PROM  Shoulder flexion 82 deg. 95 deg.  Shoulder extension    Shoulder abduction 74 deg. 88 deg.  Shoulder adduction    Shoulder internal rotation [facility]  Shoulder external rotation 30 deg. 0 deg.  Elbow flexion [facility] WFL  Elbow extension [facility]  Wrist flexion    Wrist extension    Wrist ulnar deviation    Wrist radial deviation    Wrist pronation    Wrist supination    (Blank rows = not tested)  There.ex.:  Seated shoulder pulley (flexion/ scaption/ abduction)- 20x each.  Warm-up.    Seated L shoulder AA/PROM flexion/ scaption/ punches 5x2.  Cuing to hold L shoulder >90 deg. With PT assist for UE control.      Seated STM to L and R shoulder and UT musculature during sh. ROM reassessment.  Moderate posterior deltoid, bicep/ tricep musculature tenderness, esp. In R sh.  There.act.:  Standing at shelves (64 in. 2nd shelf)- L overhead reaching with cones.  Light PT assist at elbow and cuing manage shoulder/UE.    Standing shoulder shrugs/ scapular retraction  Walking in clinic with use of SPC.     NOT TODAY Standing Blaze pods at table top:  6 pods with use of 1# dumbbells (Focus set-up).  Attempted 2# wt. For initial attempt (challenged).    Nautilus: AAROM/min. Assist with all movements.  Seated lat pull down 20# 2x15  Seated mid row 20# 2x15 Seated neutral grip low row 20# 2x15   PATIENT EDUCATION: Education details: Reviewed HEP Person educated: Patient and Spouse Education method: Explanation and Demonstration Education comprehension: verbalized understanding and returned demonstration  HOME EXERCISE PROGRAM: Access Code: [email or web address] URL: [email or web address] Date: 11/06/2023 Prepared by: Hazeline Lister  Exercises - Seated Scapular Retraction  - 1 x daily - 7 x weekly - 2 sets - 10 reps - Standing Isometric Shoulder External Rotation with  Doorway  - 1 x daily - 7 x weekly - 2 sets - 10 reps - 5-10sec hold - Isometric Shoulder Abduction at Wall  - 1 x daily - 7 x weekly - 2 sets - 10 reps - 5-10 sec hold - Isometric Shoulder Flexion at Wall  - 1 x daily - 7 x weekly - 2 sets - 10 reps - 5-10 sec hold   Access Code: [email or web address] URL: [email or web address] Date: 11/18/2023 Prepared by: Hazeline Lister  Exercises - Seated Scapular Retraction  - 1 x daily - 7 x weekly - 2 sets - 10 reps - Supine Shoulder External Rotation AAROM with Dowel  - 1 x daily - 7 x weekly - 3 sets - 10 reps - Supine Shoulder Flexion Overhead with Dowel  - 1 x daily - 7 x weekly - 3 sets - 10 reps - Supine Shoulder Press with Dowel  - 1 x daily - 7 x weekly - 3 sets - 10 reps - Supine Shoulder Abduction AAROM with Dowel  - 1 x daily - 7 x weekly - 3 sets - 10 reps - Supine Shoulder Flexion AAROM with Dowel  - 1 x daily - 7 x weekly - 3 sets - 10 reps  Access Code: [email or web address] URL: [email or web address] Date: 12/02/2023 Prepared by:  Dorene Grebe  Exercises - Supine Shoulder External Rotation AAROM with Dowel  - 1 x daily - 5 x weekly - 3 sets - 10 reps - Supine Shoulder Flexion Overhead with Dowel  - 1 x daily - 5 x weekly - 3 sets - 10 reps - Supine Shoulder Press with Dowel  - 1 x daily - 5 x weekly - 3 sets - 10 reps - Supine Shoulder Abduction AAROM with Dowel  - 1 x daily - 5 x weekly - 3 sets - 10 reps - Supine Shoulder Flexion AAROM with Dowel  - 1 x daily - 5 x weekly - 3 sets - 10 reps - Shoulder extension with resistance - Neutral  - 1 x daily - 5 x weekly - 3 sets - 10 reps - Standing Tricep Extensions with Resistance  - 1 x daily - 5 x weekly - 3 sets - 10 reps - Standing Single Arm Bicep Curls Supinated with Dumbbell  - 1 x daily - 5 x weekly - 3 sets - 10 reps  ASSESSMENT:  CLINICAL IMPRESSION: Tx. Focused on L shoulder AA/PROM in pain tolerable range and progression of L shoulder functional reaching/  strengthening. Pt. Remains limited by joint stiffness/ pain.  L shoulder fatigue with overhead reaching at shelf and requires PT assist.  See updated L/R shoulder AROM.  Pt. Will benefit from skilled PT services to increase L shoulder ROM/ strength to promote return to PLOF/ mobility with SPC.       OBJECTIVE IMPAIRMENTS: decreased activity tolerance, decreased endurance, decreased ROM, decreased strength, hypomobility, impaired flexibility, impaired sensation, improper body mechanics, postural dysfunction, and pain.   ACTIVITY LIMITATIONS: carrying, lifting, bathing, toileting, dressing, self feeding, reach over head, and hygiene/grooming  PARTICIPATION LIMITATIONS: meal prep, cleaning, laundry, and shopping  PERSONAL FACTORS: Fitness and Past/current experiences are also affecting patient's functional outcome.   REHAB POTENTIAL: Good  CLINICAL DECISION MAKING: Stable/uncomplicated  EVALUATION COMPLEXITY: Moderate   GOALS: Goals reviewed with patient? Yes  LONG TERM GOALS: Target date: 03/02/24  Pt. Able to complete HEP with no increase c/o L shoulder pain to improve L sh. Joint mobility/ stability.   Baseline:  Goal status: Partially met  2.  Pt. Will increase L shoulder AROM to >100 deg. Flexion to improve management of hair/ feeding.   Baseline: no L shoulder AROM at this time.   2/24:  pt. Challenged managing hair with compensatory movement patterns.  4/15: see above chart Goal status: Not met  3.  Pt. Able to ambulate with use of SPC on L with consistent recip. Gait pattern and on c/o L shoulder pain to improve household mobility.   Baseline: Pt. Unable to use L shoulder/ SPC at this time.  Goal status: Partially met  4.  Pt. Will be able to dress/ groom independently with no shoulder pain/ limitations.    Baseline: limited with use of L shoulder during dressing  Goal status: Initial  PLAN:  PT FREQUENCY: 1-2x/week  PT DURATION: 4 weeks  PLANNED INTERVENTIONS:  97110-Therapeutic exercises, 97530- Therapeutic activity, [id number]- Neuromuscular re-education, 97535- Self Care, 16109- Manual therapy, Patient/Family education, Joint mobilization, DME instructions, Cryotherapy, and Moist heat  PLAN FOR NEXT SESSION:  Progress nautilus exercises to both seated and standing positions.   Cammie Mcgee, PT, DPT # [phone] 02/03/2024, 6:28 PM

## 2024-02-05 ENCOUNTER — Ambulatory Visit: Attending: Orthopedic Surgery | Admitting: Physical Therapy

## 2024-02-05 DIAGNOSIS — M25612 Stiffness of left shoulder, not elsewhere classified: Secondary | ICD-10-CM | POA: Insufficient documentation

## 2024-02-05 DIAGNOSIS — M25512 Pain in left shoulder: Secondary | ICD-10-CM | POA: Insufficient documentation

## 2024-02-05 DIAGNOSIS — M6281 Muscle weakness (generalized): Secondary | ICD-10-CM | POA: Diagnosis present

## 2024-02-05 NOTE — Therapy (Signed)
 OUTPATIENT PHYSICAL THERAPY SHOULDER TREATMENT  Patient Name: Evelyn Cisneros MRN: [id number] DOB:1941-02-13, 83 y.o., female Today's Date: 02/05/2024  END OF SESSION:  PT End of Session - 02/05/24 1107     Visit Number 15    Number of Visits 22    Date for PT Re-Evaluation 03/02/24    PT Start Time 1108    PT Stop Time 1152    PT Time Calculation (min) 44 min            Past Medical History:  Diagnosis Date   Anemia    Arthritis    Heart murmur    Hypertension    Hypothyroidism    Spondylolisthesis of lumbar region    Past Surgical History:  Procedure Laterality Date   ABDOMINAL HYSTERECTOMY     REPLACEMENT TOTAL KNEE BILATERAL     SPINE SURGERY     Patient Active Problem List   Diagnosis Date Noted   Spondylolisthesis of lumbar region 05/04/2018   Abnormal glucose level 02/12/2018   Abnormal weight gain 02/12/2018   Allergic rhinitis 02/12/2018   Asthma 02/12/2018   Bradycardia 02/12/2018   Edema 02/12/2018   Heart murmur 02/12/2018   Hypercalcemia 02/12/2018   Hypokalemia 02/12/2018   Pure hypercholesterolemia 02/12/2018   Shoulder joint pain 02/12/2018   Skin sensation disturbance 02/12/2018   Syncope 02/12/2018   Vitamin D deficiency 02/12/2018   Central cervical cord injury, 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, 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) 04/24/2017   Muscle spasticity 04/24/2017   Weight loss 12/19/2016   Hypothyroidism 11/24/2016   Bilateral leg edema 11/24/2016   Cervical radiculopathy 11/24/2016   Disc degeneration, lumbar 11/24/2016   Essential hypertension 11/24/2016   Gallstones 11/24/2016   History of anxiety state 11/24/2016   Hyperlipidemia, unspecified 11/24/2016   Spinal stenosis of lumbar region with neurogenic claudication 11/08/2016   Anxiety 06/08/2015   PCP: Verner Mould, MD  REFERRING PROVIDER: Michell Heinrich, PA-C  REFERRING DIAG: [phone] (ICD-10-CM) - Anterior dislocation of left humerus   THERAPY DIAG:  Post-traumatic stiffness of left  shoulder joint  Acute pain of left shoulder  Muscle weakness (generalized)  Rationale for Evaluation and Treatment: Rehabilitation  ONSET DATE: 10/20/23  SUBJECTIVE:                                                                                                                                                                                      SUBJECTIVE STATEMENT: Pt. Reports falling while pushing shopping cart in parking lot at [place].  Pt. Hit head resulting in lacerations/ stitches and L anterior shoulder dislocation.  Pt. Reports 0/10 L shoulder pain at rest and 2/10 pain with eating/brushing teeth.  Pt. Was using SPC on L prior to fall but limited due to shoulder sling.  Pt. Entered PT in w/c today due to inability to use L UE at this time.  Hand dominance: Left  PERTINENT HISTORY: Subjective   Evelyn Cisneros is a 83 y.o. female in today for: HPI: History of Present Illness  Patient here today for suture removal. She underwent lac repair of her forehead after a fall on concrete 12/30. Repaired 2 lacs with 2 interrupted sutures and 6 running locked sutures.   Today, patient reports healing well. She has had a nodule come up on her left wrist. It is mildly tender to palpation, does not bother her otherwise.   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 90 tablet 3  atorvastatin (LIPITOR) 40 MG tablet TAKE 1 TABLET(40 MG) BY MOUTH EVERY DAY 90 tablet 3  biotin 5 mg Tab Take by mouth  busPIRone (BUSPAR) 5 MG tablet Take 1 tablet (5 mg total) by mouth 2 (two) times daily as needed 30 tablet 5  calcium lactate 100 mg calcium Tab Take by mouth  cholecalciferol, vitamin D3, (VITAMIN D3) 125 mcg (5,000 unit) Tab Take 1 tablet by mouth every morning 90 tablet 3  co-enzyme Q-10, ubiquinone, 200 mg capsule Take 200 mg by mouth every morning.   cyanocobalamin (VITAMIN B12) 1,000 mcg SL tablet Take by mouth  ferrous sulfate 325 (65 FE) MG tablet Take 1 tablet (325 mg total) by mouth twice a week 90 tablet 3  FUROsemide (LASIX) 20 MG tablet Take 1-2 tablets (20-40 mg total) by mouth once daily as needed for Edema 60 tablet 4  gabapentin (NEURONTIN) 300 MG capsule TAKE ONE CAPSULE BY MOUTH THREE TIMES DAILY TO FOUR TIMES DAILY AS NEEDED FOR PAIN 360 capsule 3  Lactobacillus acidophilus (PROBIOTIC ORAL) Take by mouth once daily  levothyroxine (SYNTHROID) 50 MCG tablet Take 1.5 tablets (75 mcg total) by mouth once daily 135 tablet 3  lidocaine (LIDODERM) 5 % patch as needed  losartan (COZAAR) 100 MG tablet TAKE 1 TABLET(100 MG) BY MOUTH EVERY DAY 90 tablet 3  MAGNESIUM ORAL Take by mouth  VIT C/VIT E AC/LUT/COPPER/ZINC (PRESERVISION LUTEIN ORAL) Take 1 tablet by mouth every morning. Last dose one week prior to procedure  ZINC ORAL Take 50 mg by mouth once daily   No facility-administered medications prior to visit.   Objective   Vitals:  10/30/23 1350  PainSc: 0-No pain   There is no height or weight on file to calculate BMI. Home Vitals:  Physical Exam Constitutional:  General: She is not in acute distress. Appearance: Normal appearance.  HENT:  Head: Normocephalic and atraumatic.  Eyes:  General: No scleral icterus. Extraocular Movements: Extraocular movements intact.  Conjunctiva/sclera: Conjunctivae normal.   Cardiovascular:  Rate and Rhythm: Normal rate and regular rhythm.  Pulmonary:  Effort: Pulmonary effort is normal. No respiratory distress.  Breath sounds: Normal breath sounds.  Musculoskeletal:  General: Normal range of motion.  Cervical back: Normal range of motion and neck supple.  Comments: Head lacerations with good interval healing. All sutures visualized for removal.   Left hand with significant bruising from PIPs to upper wrist, interval resolution present. There is a cystic lesion, nonmobile, mildly tender to manipulation present in her posterior central wrist.  Skin: General: Skin is warm and dry.  Coloration: Skin is not jaundiced.  Findings: No erythema or rash.  Neurological:  General: No focal deficit present.  Mental Status: She is alert.  Cranial Nerves: No cranial nerve deficit.  Coordination: Coordination normal.   Assessment & Plan  Diagnoses and all orders for this visit:  Visit for suture removal - Running central scalp suture and 2x interrupted suture of right scalp removed without difficulty. Area clean with good interval healing to wound.  - Recommend gentle washing with soap and water. Avoid aggressive scrubbing. Keep area clean and dry. Reviewed warning s/s for wound healing or new onset infection requiring further evaluation.  Wrist lesion - Possible ganglion cyst vs healing blister pocket, difficult to evaluate fully with overall bruising and injury from her recent fall. Reviewed possible diagnoses. If pain, worsening swelling, numbness/tingling or movement limitations recommend earlier evaluation. Otherwise recommend evaluation by orthopedics at her follow up if symptoms do not resolve with the rest of her injuries.   PAIN:  Are you having pain? Yes: NPRS scale: 2/10 Pain location: L shoulder Pain description: sharp/aching Aggravating factors: movement/ brushing teeth Relieving factors: rest/ use of sling  PRECAUTIONS: Shoulder  RED  FLAGS: None   WEIGHT BEARING RESTRICTIONS: No  FALLS:  Has patient fallen in last 6 months? Yes. Number of falls 2  LIVING ENVIRONMENT: Lives with: lives with their spouse Lives in: House/apartment Has following equipment at home: Single point cane and Wheelchair (manual)  OCCUPATION: Retired  PLOF: Independent with household mobility with device  PATIENT GOALS:  Increase L shoulder AROM/ strength/ pain-free mobility.    NEXT MD VISIT: 11/14/23 with Dr. Lorenz Romano (ortho MD)  OBJECTIVE:  Note: Objective measures were completed at Evaluation unless otherwise noted.  DIAGNOSTIC FINDINGS:  See imaging  PATIENT SURVEYS:  FOTO initial 32/ goal 60   COGNITION: Overall cognitive status: Within functional limits for tasks assessed     SENSATION: WFL.  Moderate L hand bruising.    POSTURE: Rounded shoulders/ forward posture.  Use of L shoulder sling.    UPPER EXTREMITY ROM:  Full tear of R supraspinatus reported.    Passive ROM Right Eval AROM Left Eval PROM  Shoulder flexion 74 deg. 53 deg.  Shoulder extension    Shoulder abduction 70 deg. 42 deg.  Shoulder adduction    Shoulder internal rotation  [facility]  Shoulder external rotation  0 deg.  Elbow flexion [facility] WFL  Elbow extension [facility] WFL  Wrist flexion    Wrist extension    Wrist ulnar deviation    Wrist radial deviation    Wrist pronation    Wrist supination    (Blank rows = not tested)  UPPER EXTREMITY MMT: No MMT testing today.    SHOULDER SPECIAL TESTS: NT  JOINT MOBILITY TESTING:  No L shoulder joint mobs. Secondary to anterior dislocation.    PALPATION:  Generalized L hand tenderness/ bruising.    4/15:  Passive ROM Right seated AROM Left seated PROM  Shoulder flexion 82 deg. 95 deg.  Shoulder extension    Shoulder abduction 74 deg.  88 deg.  Shoulder adduction    Shoulder internal rotation [facility]  Shoulder external rotation 30 deg. 0 deg.  Elbow flexion [facility] WFL  Elbow extension [facility]   Wrist flexion    Wrist extension    Wrist ulnar deviation    Wrist radial deviation    Wrist pronation    Wrist supination    (Blank rows = not tested)                                                                                                             TREATMENT DATE: 02/05/2024  Subjective:  Pt. Reports 2-3/10 L posterior shoulder pain at rest this morning.  Pt. Reports compliance with HEP and has been really busy catching up with household tasks since husband's back surgery.    There.ex.:  Seated L shoulder AA/PROM flexion/ scaption/ punches 4x2.  Cuing to hold L shoulder >90 deg. With PT assist for UE control.  Discomfort with eccentric L shoulder control.     Nautilus: AAROM/min. Assist with all movements:  Seated lat pull down 30#  2x15  Seated mid row 20#  2x15 Seated neutral grip low row 20#  2x15   Seated STM to L and R shoulder and UT musculature during sh. ROM reassessment.  Moderate posterior deltoid, bicep/ tricep musculature tenderness, esp. In R sh.  There.act.:  Standing at shelves (64 in. 2nd shelf)- L overhead reaching with cones.  Light PT assist at elbow and cuing manage shoulder/UE.    Standing shoulder shrugs/ scapular retraction.  Discussed HEP  Walking in clinic with use of SPC.     PATIENT EDUCATION: Education details: Reviewed HEP Person educated: Patient and Spouse Education method: Medical illustrator Education comprehension: verbalized understanding and returned demonstration  HOME EXERCISE PROGRAM: Access Code: [email or web address] URL: [email or web address] Date: 11/06/2023 Prepared by: Hazeline Lister  Exercises - Seated Scapular Retraction  - 1 x daily - 7 x weekly - 2 sets - 10 reps - Standing Isometric Shoulder External Rotation with Doorway  - 1 x daily - 7 x weekly - 2 sets - 10 reps - 5-10sec hold - Isometric Shoulder Abduction at Wall  - 1 x daily - 7 x weekly - 2 sets - 10 reps - 5-10 sec hold - Isometric  Shoulder Flexion at Wall  - 1 x daily - 7 x weekly - 2 sets - 10 reps - 5-10 sec hold   Access Code: [email or web address] URL: [email or web address] Date: 11/18/2023 Prepared by: Hazeline Lister  Exercises - Seated Scapular Retraction  - 1 x daily - 7 x weekly - 2 sets - 10 reps - Supine Shoulder External Rotation AAROM with Dowel  - 1 x daily - 7 x weekly - 3 sets - 10 reps - Supine Shoulder Flexion Overhead with Dowel  - 1 x daily - 7 x weekly - 3 sets - 10 reps - Supine Shoulder Press with Dowel  - 1 x daily - 7 x weekly - 3 sets - 10 reps - Supine Shoulder Abduction AAROM with  Dowel  - 1 x daily - 7 x weekly - 3 sets - 10 reps - Supine Shoulder Flexion AAROM with Dowel  - 1 x daily - 7 x weekly - 3 sets - 10 reps  Access Code: [email or web address] URL: [email or web address] Date: 12/02/2023 Prepared by: Hazeline Lister  Exercises - Supine Shoulder External Rotation AAROM with Dowel  - 1 x daily - 5 x weekly - 3 sets - 10 reps - Supine Shoulder Flexion Overhead with Dowel  - 1 x daily - 5 x weekly - 3 sets - 10 reps - Supine Shoulder Press with Dowel  - 1 x daily - 5 x weekly - 3 sets - 10 reps - Supine Shoulder Abduction AAROM with Dowel  - 1 x daily - 5 x weekly - 3 sets - 10 reps - Supine Shoulder Flexion AAROM with Dowel  - 1 x daily - 5 x weekly - 3 sets - 10 reps - Shoulder extension with resistance - Neutral  - 1 x daily - 5 x weekly - 3 sets - 10 reps - Standing Tricep Extensions with Resistance  - 1 x daily - 5 x weekly - 3 sets - 10 reps - Standing Single Arm Bicep Curls Supinated with Dumbbell  - 1 x daily - 5 x weekly - 3 sets - 10 reps  ASSESSMENT:  CLINICAL IMPRESSION: Tx. Focused on L shoulder AA/PROM in pain tolerable range and progression of L shoulder functional reaching/ strengthening. Pt. Remains limited by joint stiffness/ pain.  L shoulder fatigue with overhead reaching at shelf and requires PT assist.  Pt. Remains motivated to increase L shoulder motor  control.  Pt. Will benefit from skilled PT services to increase L shoulder ROM/ strength to promote return to PLOF/ mobility with SPC.       OBJECTIVE IMPAIRMENTS: decreased activity tolerance, decreased endurance, decreased ROM, decreased strength, hypomobility, impaired flexibility, impaired sensation, improper body mechanics, postural dysfunction, and pain.   ACTIVITY LIMITATIONS: carrying, lifting, bathing, toileting, dressing, self feeding, reach over head, and hygiene/grooming  PARTICIPATION LIMITATIONS: meal prep, cleaning, laundry, and shopping  PERSONAL FACTORS: Fitness and Past/current experiences are also affecting patient's functional outcome.   REHAB POTENTIAL: Good  CLINICAL DECISION MAKING: Stable/uncomplicated  EVALUATION COMPLEXITY: Moderate   GOALS: Goals reviewed with patient? Yes  LONG TERM GOALS: Target date: 03/02/24  Pt. Able to complete HEP with no increase c/o L shoulder pain to improve L sh. Joint mobility/ stability.   Baseline:  Goal status: Partially met  2.  Pt. Will increase L shoulder AROM to >100 deg. Flexion to improve management of hair/ feeding.   Baseline: no L shoulder AROM at this time.   2/24:  pt. Challenged managing hair with compensatory movement patterns.  4/15: see above chart Goal status: Not met  3.  Pt. Able to ambulate with use of SPC on L with consistent recip. Gait pattern and on c/o L shoulder pain to improve household mobility.   Baseline: Pt. Unable to use L shoulder/ SPC at this time.  Goal status: Partially met  4.  Pt. Will be able to dress/ groom independently with no shoulder pain/ limitations.    Baseline: limited with use of L shoulder during dressing  Goal status: Initial  PLAN:  PT FREQUENCY: 1-2x/week  PT DURATION: 4 weeks  PLANNED INTERVENTIONS: 97110-Therapeutic exercises, 97530- Therapeutic activity, [id number]- Neuromuscular re-education, 97535- Self Care, 54098- Manual therapy, Patient/Family education, Joint  mobilization, DME instructions, Cryotherapy, and Moist heat  PLAN FOR NEXT  SESSION:  Progress nautilus exercises to both seated and standing positions.   Lendell Quarry, PT, DPT # [phone] 02/05/2024, 12:30 PM

## 2024-02-10 ENCOUNTER — Ambulatory Visit: Admitting: Physical Therapy

## 2024-02-10 ENCOUNTER — Encounter: Payer: Self-pay | Admitting: Physical Therapy

## 2024-02-10 DIAGNOSIS — M6281 Muscle weakness (generalized): Secondary | ICD-10-CM

## 2024-02-10 DIAGNOSIS — M25612 Stiffness of left shoulder, not elsewhere classified: Secondary | ICD-10-CM | POA: Diagnosis not present

## 2024-02-10 DIAGNOSIS — M25512 Pain in left shoulder: Secondary | ICD-10-CM

## 2024-02-10 NOTE — Therapy (Signed)
 OUTPATIENT PHYSICAL THERAPY SHOULDER TREATMENT  Patient Name: Evelyn Cisneros MRN: [id number] DOB:[birth year], 83 y.o., female Today's Date: 02/10/2024  END OF SESSION:  PT End of Session - 02/10/24 1107     Visit Number 16    Number of Visits 22    Date for PT Re-Evaluation 03/02/24    PT Start Time 1107            1107 to 1155 (48 minutes)  Past Medical History:  Diagnosis Date   Anemia    Arthritis    Heart murmur    Hypertension    Hypothyroidism    Spondylolisthesis of lumbar region    Past Surgical History:  Procedure Laterality Date   ABDOMINAL HYSTERECTOMY     REPLACEMENT TOTAL KNEE BILATERAL     SPINE SURGERY     Patient Active Problem List   Diagnosis Date Noted   Spondylolisthesis of lumbar region 05/04/2018   Abnormal glucose level 02/12/2018   Abnormal weight gain 02/12/2018   Allergic rhinitis 02/12/2018   Asthma 02/12/2018   Bradycardia 02/12/2018   Edema 02/12/2018   Heart murmur 02/12/2018   Hypercalcemia 02/12/2018   Hypokalemia 02/12/2018   Pure hypercholesterolemia 02/12/2018   Shoulder joint pain 02/12/2018   Skin sensation disturbance 02/12/2018   Syncope 02/12/2018   Vitamin D  deficiency 02/12/2018   Central cervical cord injury, 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, 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) 04/24/2017   Muscle spasticity 04/24/2017   Weight loss 12/19/2016   Hypothyroidism 11/24/2016   Bilateral leg edema 11/24/2016   Cervical radiculopathy 11/24/2016   Disc degeneration, lumbar 11/24/2016   Essential hypertension 11/24/2016   Gallstones 11/24/2016   History of anxiety state 11/24/2016   Hyperlipidemia, unspecified 11/24/2016   Spinal stenosis of lumbar region with neurogenic claudication 11/08/2016   Anxiety 06/08/2015   PCP: Levell Reach, MD  REFERRING PROVIDER: Neomia Banner, PA-C  REFERRING DIAG: [phone] (ICD-10-CM) - Anterior dislocation of left humerus   THERAPY DIAG:  Post-traumatic stiffness of left shoulder joint  Acute pain of  left shoulder  Muscle weakness (generalized)  Rationale for Evaluation and Treatment: Rehabilitation  ONSET DATE: 10/20/23  SUBJECTIVE:                                                                                                                                                                                      SUBJECTIVE STATEMENT: Pt. Reports falling while pushing shopping cart in parking lot at [place].  Pt. Hit head resulting in lacerations/ stitches and L anterior shoulder dislocation.  Pt. Reports 0/10 L shoulder pain at rest and 2/10 pain with eating/brushing teeth.  Pt. Was using SPC on L prior to fall  but limited due to shoulder sling.  Pt. Entered PT in w/c today due to inability to use L UE at this time.  Hand dominance: Left  PERTINENT HISTORY: Subjective   Evelyn Cisneros is a 83 y.o. female in today for: HPI: History of Present Illness  Patient here today for suture removal. She underwent lac repair of her forehead after a fall on concrete 12/30. Repaired 2 lacs with 2 interrupted sutures and 6 running locked sutures.   Today, patient reports healing well. She has had a nodule come up on her left wrist. It is mildly tender to palpation, does not bother her otherwise.   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 90 tablet 3   atorvastatin  (LIPITOR) 40 MG tablet TAKE 1 TABLET(40 MG) BY MOUTH EVERY DAY 90 tablet 3  biotin 5 mg Tab Take by mouth  busPIRone (BUSPAR) 5 MG tablet Take 1 tablet (5 mg total) by mouth 2 (two) times daily as needed 30 tablet 5  calcium  lactate 100 mg calcium  Tab Take by mouth  cholecalciferol , vitamin D3, (VITAMIN D3) 125 mcg (5,000 unit) Tab Take 1 tablet by mouth every morning 90 tablet 3  co-enzyme Q-10, ubiquinone, 200 mg capsule Take 200 mg by mouth every morning.   cyanocobalamin (VITAMIN B12) 1,000 mcg SL tablet Take by mouth  ferrous sulfate  325 (65 FE) MG tablet Take 1 tablet (325 mg total) by mouth twice a week 90 tablet 3  FUROsemide  (LASIX ) 20 MG tablet Take 1-2 tablets (20-40 mg total) by mouth once daily as needed for Edema 60 tablet 4  gabapentin  (NEURONTIN ) 300 MG capsule TAKE ONE CAPSULE BY MOUTH THREE TIMES DAILY TO FOUR TIMES DAILY AS NEEDED FOR PAIN 360 capsule 3  Lactobacillus acidophilus (PROBIOTIC ORAL) Take by mouth once daily  levothyroxine  (SYNTHROID ) 50 MCG tablet Take 1.5 tablets (75 mcg total) by mouth once daily 135 tablet 3  lidocaine  (LIDODERM ) 5 % patch as needed  losartan  (COZAAR ) 100 MG tablet TAKE 1 TABLET(100 MG) BY MOUTH EVERY DAY 90 tablet 3  MAGNESIUM ORAL Take by mouth  VIT C/VIT E AC/LUT/COPPER/ZINC  (PRESERVISION LUTEIN ORAL) Take 1 tablet by mouth every morning. Last dose one week prior to procedure  ZINC  ORAL Take 50 mg by mouth once daily   No facility-administered medications prior to visit.   Objective   Vitals:  10/30/23 1350  PainSc: 0-No pain   There is no height or weight on file to calculate BMI. Home Vitals:  Physical Exam Constitutional:  General: She is not in acute distress. Appearance: Normal appearance.  HENT:  Head: Normocephalic and atraumatic.  Eyes:  General: No scleral icterus. Extraocular Movements: Extraocular movements intact.  Conjunctiva/sclera: Conjunctivae normal.  Cardiovascular:  Rate and Rhythm:  Normal rate and regular rhythm.  Pulmonary:  Effort: Pulmonary effort is normal. No respiratory distress.  Breath sounds: Normal breath sounds.  Musculoskeletal:  General: Normal range of motion.  Cervical back: Normal range of motion and neck supple.  Comments: Head lacerations with good interval healing. All sutures visualized for removal.   Left hand with significant bruising from PIPs to upper wrist, interval resolution present. There is a cystic lesion, nonmobile, mildly tender to manipulation present in her posterior central wrist.  Skin: General: Skin is warm and dry.  Coloration: Skin is not jaundiced.  Findings: No erythema or rash.  Neurological:  General: No focal deficit present.  Mental Status: She is alert.  Cranial Nerves: No cranial nerve deficit.  Coordination: Coordination normal.   Assessment & Plan  Diagnoses and all orders for this visit:  Visit for suture removal - Running central scalp suture and 2x interrupted suture of right scalp removed without difficulty. Area clean with good interval healing to wound.  - Recommend gentle washing with soap and water. Avoid aggressive scrubbing. Keep area clean and dry. Reviewed warning s/s for wound healing or new onset infection requiring further evaluation.  Wrist lesion - Possible ganglion cyst vs healing blister pocket, difficult to evaluate fully with overall bruising and injury from her recent fall. Reviewed possible diagnoses. If pain, worsening swelling, numbness/tingling or movement limitations recommend earlier evaluation. Otherwise recommend evaluation by orthopedics at her follow up if symptoms do not resolve with the rest of her injuries.   PAIN:  Are you having pain? Yes: NPRS scale: 2/10 Pain location: L shoulder Pain description: sharp/aching Aggravating factors: movement/ brushing teeth Relieving factors: rest/ use of sling  PRECAUTIONS: Shoulder  RED FLAGS: None   WEIGHT BEARING RESTRICTIONS:  No  FALLS:  Has patient fallen in last 6 months? Yes. Number of falls 2  LIVING ENVIRONMENT: Lives with: lives with their spouse Lives in: House/apartment Has following equipment at home: Single point cane and Wheelchair (manual)  OCCUPATION: Retired  PLOF: Independent with household mobility with device  PATIENT GOALS:  Increase L shoulder AROM/ strength/ pain-free mobility.    NEXT MD VISIT: 11/14/23 with Dr. Lorenz Romano (ortho MD)  OBJECTIVE:  Note: Objective measures were completed at Evaluation unless otherwise noted.  DIAGNOSTIC FINDINGS:  See imaging  PATIENT SURVEYS:  FOTO initial 32/ goal 80   COGNITION: Overall cognitive status: Within functional limits for tasks assessed     SENSATION: WFL.  Moderate L hand bruising.    POSTURE: Rounded shoulders/ forward posture.  Use of L shoulder sling.    UPPER EXTREMITY ROM:  Full tear of R supraspinatus reported.    Passive ROM Right Eval AROM Left Eval PROM  Shoulder flexion 74 deg. 53 deg.  Shoulder extension    Shoulder abduction 70 deg. 42 deg.  Shoulder adduction    Shoulder internal rotation  [facility]  Shoulder external rotation  0 deg.  Elbow flexion [facility] WFL  Elbow extension [facility] WFL  Wrist flexion    Wrist extension    Wrist ulnar deviation    Wrist radial deviation    Wrist pronation    Wrist supination    (Blank rows = not tested)  UPPER EXTREMITY MMT: No MMT testing today.    SHOULDER SPECIAL TESTS: NT  JOINT MOBILITY TESTING:  No L shoulder joint mobs. Secondary to anterior dislocation.    PALPATION:  Generalized L hand tenderness/ bruising.    4/15:  Passive ROM Right seated AROM Left seated PROM  Shoulder flexion 82 deg. 95 deg.  Shoulder extension    Shoulder abduction 74 deg. 88 deg.  Shoulder adduction  Shoulder internal rotation [facility]  Shoulder external rotation 30 deg. 0 deg.  Elbow flexion [facility] WFL  Elbow extension [facility]  Wrist flexion    Wrist extension    Wrist  ulnar deviation    Wrist radial deviation    Wrist pronation    Wrist supination    (Blank rows = not tested)                                                                                                             TREATMENT DATE: 02/10/2024  Subjective:  Pt. Reports 2-3/10 L posterior shoulder pain at rest this morning.  Pt. Reports compliance with HEP and has been really busy catching up with household tasks since husband's back surgery.    There.ex.:  Seated L shoulder AA/PROM flexion/ scaption/ punches 4x2.  Cuing to hold L shoulder >90 deg. With PT assist for UE control.  Discomfort with eccentric L shoulder control.     Nautilus: AAROM/min. Assist with all movements:  Seated lat pull down 30#  2x15  Standing tricep extension 20# 2x15 Seated mid row 20#  2x15 Seated neutral grip low row 20#  2x15  Standing L shoulder pull downs with single handles 30# (15x)- good L hand grasp.    Manual tx.:  Seated STM to L and R shoulder and UT musculature during sh. ROM reassessment.  Moderate L posterior deltoid, bicep/ tricep insertion site tenderness, esp. In R sh.   NOT TODAY: Standing at shelves (64 in. 2nd shelf)- L overhead reaching with cones.  Light PT assist at elbow and cuing manage shoulder/UE.    Standing shoulder shrugs/ scapular retraction.  Discussed HEP  Walking in clinic with use of SPC.     PATIENT EDUCATION: Education details: Reviewed HEP Person educated: Patient and Spouse Education method: Medical illustrator Education comprehension: verbalized understanding and returned demonstration  HOME EXERCISE PROGRAM: Access Code: [email or web address] URL: [email or web address] Date: 11/06/2023 Prepared by: Hazeline Lister  Exercises - Seated Scapular Retraction  - 1 x daily - 7 x weekly - 2 sets - 10 reps - Standing Isometric Shoulder External Rotation with Doorway  - 1 x daily - 7 x weekly - 2 sets - 10 reps - 5-10sec hold - Isometric Shoulder  Abduction at Wall  - 1 x daily - 7 x weekly - 2 sets - 10 reps - 5-10 sec hold - Isometric Shoulder Flexion at Wall  - 1 x daily - 7 x weekly - 2 sets - 10 reps - 5-10 sec hold   Access Code: [email or web address] URL: [email or web address] Date: 11/18/2023 Prepared by: Hazeline Lister  Exercises - Seated Scapular Retraction  - 1 x daily - 7 x weekly - 2 sets - 10 reps - Supine Shoulder External Rotation AAROM with Dowel  - 1 x daily - 7 x weekly - 3 sets - 10 reps - Supine Shoulder Flexion Overhead with Dowel  - 1 x daily - 7 x weekly - 3 sets - 10 reps - Supine Shoulder Press with Dowel  -  1 x daily - 7 x weekly - 3 sets - 10 reps - Supine Shoulder Abduction AAROM with Dowel  - 1 x daily - 7 x weekly - 3 sets - 10 reps - Supine Shoulder Flexion AAROM with Dowel  - 1 x daily - 7 x weekly - 3 sets - 10 reps  Access Code: [email or web address] URL: [email or web address] Date: 12/02/2023 Prepared by: Hazeline Lister  Exercises - Supine Shoulder External Rotation AAROM with Dowel  - 1 x daily - 5 x weekly - 3 sets - 10 reps - Supine Shoulder Flexion Overhead with Dowel  - 1 x daily - 5 x weekly - 3 sets - 10 reps - Supine Shoulder Press with Dowel  - 1 x daily - 5 x weekly - 3 sets - 10 reps - Supine Shoulder Abduction AAROM with Dowel  - 1 x daily - 5 x weekly - 3 sets - 10 reps - Supine Shoulder Flexion AAROM with Dowel  - 1 x daily - 5 x weekly - 3 sets - 10 reps - Shoulder extension with resistance - Neutral  - 1 x daily - 5 x weekly - 3 sets - 10 reps - Standing Tricep Extensions with Resistance  - 1 x daily - 5 x weekly - 3 sets - 10 reps - Standing Single Arm Bicep Curls Supinated with Dumbbell  - 1 x daily - 5 x weekly - 3 sets - 10 reps  ASSESSMENT:  CLINICAL IMPRESSION: Tx. Focused on L shoulder AA/PROM in pain tolerable range and progression of L shoulder functional reaching/ strengthening. Pt. Remains limited by joint stiffness/ pain and generalized tenderness in R>L shoulder  musculature.  L shoulder fatigue at end of tx. Session and pt. Will focus on more isometric ex. At home.  Pt. Remains motivated to increase L shoulder motor control.  Pt. Will benefit from skilled PT services to increase L shoulder ROM/ strength to promote return to PLOF/ mobility with SPC.       OBJECTIVE IMPAIRMENTS: decreased activity tolerance, decreased endurance, decreased ROM, decreased strength, hypomobility, impaired flexibility, impaired sensation, improper body mechanics, postural dysfunction, and pain.   ACTIVITY LIMITATIONS: carrying, lifting, bathing, toileting, dressing, self feeding, reach over head, and hygiene/grooming  PARTICIPATION LIMITATIONS: meal prep, cleaning, laundry, and shopping  PERSONAL FACTORS: Fitness and Past/current experiences are also affecting patient's functional outcome.   REHAB POTENTIAL: Good  CLINICAL DECISION MAKING: Stable/uncomplicated  EVALUATION COMPLEXITY: Moderate   GOALS: Goals reviewed with patient? Yes  LONG TERM GOALS: Target date: 03/02/24  Pt. Able to complete HEP with no increase c/o L shoulder pain to improve L sh. Joint mobility/ stability.   Baseline:  Goal status: Partially met  2.  Pt. Will increase L shoulder AROM to >100 deg. Flexion to improve management of hair/ feeding.   Baseline: no L shoulder AROM at this time.   2/24:  pt. Challenged managing hair with compensatory movement patterns.  4/15: see above chart Goal status: Not met  3.  Pt. Able to ambulate with use of SPC on L with consistent recip. Gait pattern and on c/o L shoulder pain to improve household mobility.   Baseline: Pt. Unable to use L shoulder/ SPC at this time.  Goal status: Partially met  4.  Pt. Will be able to dress/ groom independently with no shoulder pain/ limitations.    Baseline: limited with use of L shoulder during dressing  Goal status: Initial  PLAN:  PT FREQUENCY: 1-2x/week  PT DURATION: 4 weeks  PLANNED INTERVENTIONS:  97110-Therapeutic exercises, 97530- Therapeutic activity, [id number]- Neuromuscular re-education, 97535- Self Care, 16109- Manual therapy, Patient/Family education, Joint mobilization, DME instructions, Cryotherapy, and Moist heat  PLAN FOR NEXT SESSION:  Progress nautilus exercises to both seated and standing positions.   Lendell Quarry, PT, DPT # [phone] 02/10/2024, 11:08 AM

## 2024-02-12 ENCOUNTER — Ambulatory Visit: Admitting: Physical Therapy

## 2024-02-12 DIAGNOSIS — M25512 Pain in left shoulder: Secondary | ICD-10-CM

## 2024-02-12 DIAGNOSIS — M25612 Stiffness of left shoulder, not elsewhere classified: Secondary | ICD-10-CM | POA: Diagnosis not present

## 2024-02-12 DIAGNOSIS — M6281 Muscle weakness (generalized): Secondary | ICD-10-CM

## 2024-02-17 ENCOUNTER — Ambulatory Visit: Admitting: Physical Therapy

## 2024-02-17 DIAGNOSIS — M25512 Pain in left shoulder: Secondary | ICD-10-CM

## 2024-02-17 DIAGNOSIS — M25612 Stiffness of left shoulder, not elsewhere classified: Secondary | ICD-10-CM

## 2024-02-17 DIAGNOSIS — M6281 Muscle weakness (generalized): Secondary | ICD-10-CM

## 2024-02-18 ENCOUNTER — Encounter: Payer: Self-pay | Admitting: Physical Therapy

## 2024-02-18 NOTE — Therapy (Signed)
 OUTPATIENT PHYSICAL THERAPY SHOULDER TREATMENT  Patient Name: Evelyn Cisneros MRN: [id number] DOB:1941/03/23, 83 y.o., female Today's Date: 02/18/2024  END OF SESSION:  PT End of Session - 02/18/24 1902     Visit Number 18    Number of Visits 22    Date for PT Re-Evaluation 03/02/24    PT Start Time 1105    PT Stop Time 1154    PT Time Calculation (min) 49 min            Past Medical History:  Diagnosis Date   Anemia    Arthritis    Heart murmur    Hypertension    Hypothyroidism    Spondylolisthesis of lumbar region    Past Surgical History:  Procedure Laterality Date   ABDOMINAL HYSTERECTOMY     REPLACEMENT TOTAL KNEE BILATERAL     SPINE SURGERY     Patient Active Problem List   Diagnosis Date Noted   Spondylolisthesis of lumbar region 05/04/2018   Abnormal glucose level 02/12/2018   Abnormal weight gain 02/12/2018   Allergic rhinitis 02/12/2018   Asthma 02/12/2018   Bradycardia 02/12/2018   Edema 02/12/2018   Heart murmur 02/12/2018   Hypercalcemia 02/12/2018   Hypokalemia 02/12/2018   Pure hypercholesterolemia 02/12/2018   Shoulder joint pain 02/12/2018   Skin sensation disturbance 02/12/2018   Syncope 02/12/2018   Vitamin D  deficiency 02/12/2018   Central cervical cord injury, 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, 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) 04/24/2017   Muscle spasticity 04/24/2017   Weight loss 12/19/2016   Hypothyroidism 11/24/2016   Bilateral leg edema 11/24/2016   Cervical radiculopathy 11/24/2016   Disc degeneration, lumbar 11/24/2016   Essential hypertension 11/24/2016   Gallstones 11/24/2016   History of anxiety state 11/24/2016   Hyperlipidemia, unspecified 11/24/2016   Spinal stenosis of lumbar region with neurogenic claudication 11/08/2016   Anxiety 06/08/2015   PCP: Levell Reach, MD  REFERRING PROVIDER: Neomia Banner, PA-C  REFERRING DIAG: [phone] (ICD-10-CM) - Anterior dislocation of left humerus   THERAPY DIAG:  Post-traumatic stiffness of left  shoulder joint  Acute pain of left shoulder  Muscle weakness (generalized)  Rationale for Evaluation and Treatment: Rehabilitation  ONSET DATE: 10/20/23  SUBJECTIVE:                                                                                                                                                                                      SUBJECTIVE STATEMENT: Pt. Reports falling while pushing shopping cart in parking lot at [place].  Pt. Hit head resulting in lacerations/ stitches and L anterior shoulder dislocation.  Pt. Reports 0/10 L shoulder pain at rest and 2/10 pain with eating/brushing teeth.  Pt. Was using SPC on L prior to fall but limited due to shoulder sling.  Pt. Entered PT in w/c today due to inability to use L UE at this time.  Hand dominance: Left  PERTINENT HISTORY: Subjective   Evelyn Cisneros is a 83 y.o. female in today for: HPI: History of Present Illness  Patient here today for suture removal. She underwent lac repair of her forehead after a fall on concrete 12/30. Repaired 2 lacs with 2 interrupted sutures and 6 running locked sutures.   Today, patient reports healing well. She has had a nodule come up on her left wrist. It is mildly tender to palpation, does not bother her otherwise.   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 90 tablet 3  atorvastatin  (LIPITOR) 40 MG tablet TAKE 1 TABLET(40 MG) BY MOUTH EVERY DAY 90 tablet 3  biotin 5 mg Tab Take by mouth  busPIRone (BUSPAR) 5 MG tablet Take 1 tablet (5 mg total) by mouth 2 (two) times daily as needed 30 tablet 5  calcium  lactate 100 mg calcium  Tab Take by mouth  cholecalciferol , vitamin D3, (VITAMIN D3) 125 mcg (5,000 unit) Tab Take 1 tablet by mouth every morning 90 tablet 3  co-enzyme Q-10, ubiquinone, 200 mg capsule Take 200 mg by mouth every morning.   cyanocobalamin (VITAMIN B12) 1,000 mcg SL tablet Take by mouth  ferrous sulfate  325 (65 FE) MG tablet Take 1 tablet (325 mg total) by mouth twice a week 90 tablet 3  FUROsemide  (LASIX ) 20 MG tablet Take 1-2 tablets (20-40 mg total) by mouth once daily as needed for Edema 60 tablet 4  gabapentin  (NEURONTIN ) 300 MG capsule TAKE ONE CAPSULE BY MOUTH THREE TIMES DAILY TO FOUR TIMES DAILY AS NEEDED FOR PAIN 360 capsule 3  Lactobacillus acidophilus (PROBIOTIC ORAL) Take by mouth once daily  levothyroxine  (SYNTHROID ) 50 MCG tablet Take 1.5 tablets (75 mcg total) by mouth once daily 135 tablet 3  lidocaine  (LIDODERM ) 5 % patch as needed  losartan  (COZAAR ) 100 MG tablet TAKE 1 TABLET(100 MG) BY MOUTH EVERY DAY 90 tablet 3  MAGNESIUM ORAL Take by mouth  VIT C/VIT E AC/LUT/COPPER/ZINC  (PRESERVISION LUTEIN ORAL) Take 1 tablet by mouth every morning. Last dose one week prior to procedure  ZINC  ORAL Take 50 mg by mouth once daily   No facility-administered medications prior to visit.   Objective   Vitals:  10/30/23 1350  PainSc: 0-No pain   There is no height or weight on file to calculate BMI. Home Vitals:  Physical Exam Constitutional:  General: She is not in acute distress. Appearance: Normal appearance.  HENT:  Head: Normocephalic and atraumatic.  Eyes:  General: No scleral icterus. Extraocular Movements: Extraocular movements intact.  Conjunctiva/sclera: Conjunctivae normal.   Cardiovascular:  Rate and Rhythm: Normal rate and regular rhythm.  Pulmonary:  Effort: Pulmonary effort is normal. No respiratory distress.  Breath sounds: Normal breath sounds.  Musculoskeletal:  General: Normal range of motion.  Cervical back: Normal range of motion and neck supple.  Comments: Head lacerations with good interval healing. All sutures visualized for removal.   Left hand with significant bruising from PIPs to upper wrist, interval resolution present. There is a cystic lesion, nonmobile, mildly tender to manipulation present in her posterior central wrist.  Skin: General: Skin is warm and dry.  Coloration: Skin is not jaundiced.  Findings: No erythema or rash.  Neurological:  General: No focal deficit present.  Mental Status: She is alert.  Cranial Nerves: No cranial nerve deficit.  Coordination: Coordination normal.   Assessment & Plan  Diagnoses and all orders for this visit:  Visit for suture removal - Running central scalp suture and 2x interrupted suture of right scalp removed without difficulty. Area clean with good interval healing to wound.  - Recommend gentle washing with soap and water. Avoid aggressive scrubbing. Keep area clean and dry. Reviewed warning s/s for wound healing or new onset infection requiring further evaluation.  Wrist lesion - Possible ganglion cyst vs healing blister pocket, difficult to evaluate fully with overall bruising and injury from her recent fall. Reviewed possible diagnoses. If pain, worsening swelling, numbness/tingling or movement limitations recommend earlier evaluation. Otherwise recommend evaluation by orthopedics at her follow up if symptoms do not resolve with the rest of her injuries.   PAIN:  Are you having pain? Yes: NPRS scale: 2/10 Pain location: L shoulder Pain description: sharp/aching Aggravating factors: movement/ brushing teeth Relieving factors: rest/ use of sling  PRECAUTIONS: Shoulder  RED  FLAGS: None   WEIGHT BEARING RESTRICTIONS: No  FALLS:  Has patient fallen in last 6 months? Yes. Number of falls 2  LIVING ENVIRONMENT: Lives with: lives with their spouse Lives in: House/apartment Has following equipment at home: Single point cane and Wheelchair (manual)  OCCUPATION: Retired  PLOF: Independent with household mobility with device  PATIENT GOALS:  Increase L shoulder AROM/ strength/ pain-free mobility.    NEXT MD VISIT: 11/14/23 with Dr. Lorenz Romano (ortho MD)  OBJECTIVE:  Note: Objective measures were completed at Evaluation unless otherwise noted.  DIAGNOSTIC FINDINGS:  See imaging  PATIENT SURVEYS:  FOTO initial 32/ goal 86   COGNITION: Overall cognitive status: Within functional limits for tasks assessed     SENSATION: WFL.  Moderate L hand bruising.    POSTURE: Rounded shoulders/ forward posture.  Use of L shoulder sling.    UPPER EXTREMITY ROM:  Full tear of R supraspinatus reported.    Passive ROM Right Eval AROM Left Eval PROM  Shoulder flexion 74 deg. 53 deg.  Shoulder extension    Shoulder abduction 70 deg. 42 deg.  Shoulder adduction    Shoulder internal rotation  [facility]  Shoulder external rotation  0 deg.  Elbow flexion [facility] WFL  Elbow extension [facility] WFL  Wrist flexion    Wrist extension    Wrist ulnar deviation    Wrist radial deviation    Wrist pronation    Wrist supination    (Blank rows = not tested)  UPPER EXTREMITY MMT: No MMT testing today.    SHOULDER SPECIAL TESTS: NT  JOINT MOBILITY TESTING:  No L shoulder joint mobs. Secondary to anterior dislocation.    PALPATION:  Generalized L hand tenderness/ bruising.    4/15:  Passive ROM Right seated AROM Left seated PROM  Shoulder flexion 82 deg. 95 deg.  Shoulder extension    Shoulder abduction 74 deg.  88 deg.  Shoulder adduction    Shoulder internal rotation [facility]  Shoulder external rotation 30 deg. 0 deg.  Elbow flexion [facility] WFL  Elbow extension [facility]   Wrist flexion    Wrist extension    Wrist ulnar deviation    Wrist radial deviation    Wrist pronation    Wrist supination    (Blank rows = not tested)                                                                                                             TREATMENT DATE: 02/18/2024  Subjective:  Pt. Reports minimal L posterior shoulder pain prior to tx. And states she has remained active with home tasks.  Pt. No longer as assistant on Tuesday/Thursday afternoon.    There.ex.:  Seated L shoulder AA/PROM flexion/ scaption/ punches 8x.  Cuing to hold L shoulder >90 deg. With PT assist for UE control.  Discomfort with eccentric L shoulder control.   Reassessment of L shoulder ER/IR 10x.  Nautilus: AAROM/min. Assist with all movements:  Seated lat pull down 30#  2x15  Standing tricep extension 20# 2x15 Standing bicep curls (supinated grip) 10# 2x15 Standing scap. retraction 30#  2x15  Manual tx.:  Seated STM to L and R shoulder and UT musculature during sh. ROM reassessment.   Seated L shoulder PROM (flexion/ abduction/ ER)- 6x each.  Encouraged active holds at tolerable passive end-range.    There.act.  Standing functional reaching at rolling mirror with use of Blaze Pods (random program) with focus on L shoulder ROM below eye level.  Moderate fatigue and benefits from light PT assist.  Increase L UT compensatory patterns noted with increase fatigue.     NOT TODAY: Standing at shelves (64 in. 2nd shelf)- L overhead reaching with cones.  Light PT assist at elbow and cuing manage shoulder/UE.    Standing shoulder shrugs/ scapular retraction.  Discussed HEP  Walking in clinic with use of SPC.     PATIENT EDUCATION: Education details: Reviewed HEP Person educated: Patient and Spouse Education method: Medical illustrator Education comprehension: verbalized understanding and returned demonstration  HOME EXERCISE PROGRAM: Access Code: [email or web address] URL:  [email or web address] Date: 11/06/2023 Prepared by: Hazeline Lister  Exercises - Seated Scapular Retraction  - 1 x daily - 7 x weekly - 2 sets - 10 reps - Standing Isometric Shoulder External Rotation with Doorway  - 1 x daily - 7 x weekly - 2 sets - 10 reps - 5-10sec hold - Isometric Shoulder Abduction at Wall  - 1 x daily - 7 x weekly - 2 sets - 10 reps - 5-10 sec hold - Isometric Shoulder Flexion at Wall  - 1 x daily - 7 x weekly - 2 sets - 10 reps - 5-10 sec hold   Access Code: [email or web address] URL: [email or web address] Date: 11/18/2023 Prepared by: Hazeline Lister  Exercises - Seated Scapular Retraction  - 1 x daily - 7 x weekly - 2 sets - 10 reps - Supine Shoulder External Rotation AAROM with Dowel  -  1 x daily - 7 x weekly - 3 sets - 10 reps - Supine Shoulder Flexion Overhead with Dowel  - 1 x daily - 7 x weekly - 3 sets - 10 reps - Supine Shoulder Press with Dowel  - 1 x daily - 7 x weekly - 3 sets - 10 reps - Supine Shoulder Abduction AAROM with Dowel  - 1 x daily - 7 x weekly - 3 sets - 10 reps - Supine Shoulder Flexion AAROM with Dowel  - 1 x daily - 7 x weekly - 3 sets - 10 reps  Access Code: [email or web address] URL: [email or web address] Date: 12/02/2023 Prepared by: Hazeline Lister  Exercises - Supine Shoulder External Rotation AAROM with Dowel  - 1 x daily - 5 x weekly - 3 sets - 10 reps - Supine Shoulder Flexion Overhead with Dowel  - 1 x daily - 5 x weekly - 3 sets - 10 reps - Supine Shoulder Press with Dowel  - 1 x daily - 5 x weekly - 3 sets - 10 reps - Supine Shoulder Abduction AAROM with Dowel  - 1 x daily - 5 x weekly - 3 sets - 10 reps - Supine Shoulder Flexion AAROM with Dowel  - 1 x daily - 5 x weekly - 3 sets - 10 reps - Shoulder extension with resistance - Neutral  - 1 x daily - 5 x weekly - 3 sets - 10 reps - Standing Tricep Extensions with Resistance  - 1 x daily - 5 x weekly - 3 sets - 10 reps - Standing Single Arm Bicep Curls  Supinated with Dumbbell  - 1 x daily - 5 x weekly - 3 sets - 10 reps  ASSESSMENT:  CLINICAL IMPRESSION: Tx. Focused on L shoulder A/AROM in pain tolerable range and progression of L shoulder functional reaching/ strengthening. Pt. Challenged with functional reaching tasks with Blaze pods at end of tx. Session due to fatigue.  Pt. Remains limited by joint stiffness/ pain and generalized tenderness in R>L shoulder musculature.  Pt. Will focus on more isometric ex. Strengthening ex. at home.  Pt. Remains motivated to increase L shoulder motor control.  Pt. Will benefit from skilled PT services to increase L shoulder ROM/ strength to promote return to PLOF/ mobility with SPC.       OBJECTIVE IMPAIRMENTS: decreased activity tolerance, decreased endurance, decreased ROM, decreased strength, hypomobility, impaired flexibility, impaired sensation, improper body mechanics, postural dysfunction, and pain.   ACTIVITY LIMITATIONS: carrying, lifting, bathing, toileting, dressing, self feeding, reach over head, and hygiene/grooming  PARTICIPATION LIMITATIONS: meal prep, cleaning, laundry, and shopping  PERSONAL FACTORS: Fitness and Past/current experiences are also affecting patient's functional outcome.   REHAB POTENTIAL: Good  CLINICAL DECISION MAKING: Stable/uncomplicated  EVALUATION COMPLEXITY: Moderate   GOALS: Goals reviewed with patient? Yes  LONG TERM GOALS: Target date: 03/02/24  Pt. Able to complete HEP with no increase c/o L shoulder pain to improve L sh. Joint mobility/ stability.   Baseline:  Goal status: Partially met  2.  Pt. Will increase L shoulder AROM to >100 deg. Flexion to improve management of hair/ feeding.   Baseline: no L shoulder AROM at this time.   2/24:  pt. Challenged managing hair with compensatory movement patterns.  4/15: see above chart Goal status: Not met  3.  Pt. Able to ambulate with use of SPC on L with consistent recip. Gait pattern and on c/o L shoulder  pain to improve household mobility.   Baseline: Pt. Unable to  use L shoulder/ SPC at this time.  Goal status: Partially met  4.  Pt. Will be able to dress/ groom independently with no shoulder pain/ limitations.    Baseline: limited with use of L shoulder during dressing  Goal status: Initial  PLAN:  PT FREQUENCY: 1-2x/week  PT DURATION: 4 weeks  PLANNED INTERVENTIONS: 97110-Therapeutic exercises, 97530- Therapeutic activity, [id number]- Neuromuscular re-education, 97535- Self Care, [id number]- Manual therapy, Patient/Family education, Joint mobilization, DME instructions, Cryotherapy, and Moist heat  PLAN FOR NEXT SESSION:  Progress nautilus exercises to both seated and standing positions.   Lendell Quarry, PT, DPT # [phone] 02/18/2024, 7:04 PM

## 2024-02-18 NOTE — Therapy (Signed)
 OUTPATIENT PHYSICAL THERAPY SHOULDER TREATMENT  Patient Name: Evelyn Cisneros MRN: [id number] DOB:07/23/1941, 83 y.o., female Today's Date: 02/12/24  END OF SESSION:  PT End of Session - 02/18/24 1820     Visit Number 17    Number of Visits 22    Date for PT Re-Evaluation 03/02/24    PT Start Time 1107    PT Stop Time 1155    PT Time Calculation (min) 48 min            Past Medical History:  Diagnosis Date   Anemia    Arthritis    Heart murmur    Hypertension    Hypothyroidism    Spondylolisthesis of lumbar region    Past Surgical History:  Procedure Laterality Date   ABDOMINAL HYSTERECTOMY     REPLACEMENT TOTAL KNEE BILATERAL     SPINE SURGERY     Patient Active Problem List   Diagnosis Date Noted   Spondylolisthesis of lumbar region 05/04/2018   Abnormal glucose level 02/12/2018   Abnormal weight gain 02/12/2018   Allergic rhinitis 02/12/2018   Asthma 02/12/2018   Bradycardia 02/12/2018   Edema 02/12/2018   Heart murmur 02/12/2018   Hypercalcemia 02/12/2018   Hypokalemia 02/12/2018   Pure hypercholesterolemia 02/12/2018   Shoulder joint pain 02/12/2018   Skin sensation disturbance 02/12/2018   Syncope 02/12/2018   Vitamin D  deficiency 02/12/2018   Central cervical cord injury, 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, 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) 04/24/2017   Muscle spasticity 04/24/2017   Weight loss 12/19/2016   Hypothyroidism 11/24/2016   Bilateral leg edema 11/24/2016   Cervical radiculopathy 11/24/2016   Disc degeneration, lumbar 11/24/2016   Essential hypertension 11/24/2016   Gallstones 11/24/2016   History of anxiety state 11/24/2016   Hyperlipidemia, unspecified 11/24/2016   Spinal stenosis of lumbar region with neurogenic claudication 11/08/2016   Anxiety 06/08/2015   PCP: Levell Reach, MD  REFERRING PROVIDER: Neomia Banner, PA-C  REFERRING DIAG: [phone] (ICD-10-CM) - Anterior dislocation of left humerus   THERAPY DIAG:  Post-traumatic stiffness of left  shoulder joint  Acute pain of left shoulder  Muscle weakness (generalized)  Rationale for Evaluation and Treatment: Rehabilitation  ONSET DATE: 10/20/23  SUBJECTIVE:                                                                                                                                                                                      SUBJECTIVE STATEMENT: Pt. Reports falling while pushing shopping cart in parking lot at [employer].  Pt. Hit head resulting in lacerations/ stitches and L anterior shoulder dislocation.  Pt. Reports 0/10 L shoulder pain at rest and 2/10 pain with eating/brushing teeth.  Pt. Was using SPC on L prior to fall but limited due to shoulder sling.  Pt. Entered PT in w/c today due to inability to use L UE at this time.  Hand dominance: Left  PERTINENT HISTORY: Subjective   Evelyn Cisneros is a 83 y.o. female in today for: HPI: History of Present Illness  Patient here today for suture removal. She underwent lac repair of her forehead after a fall on concrete 12/30. Repaired 2 lacs with 2 interrupted sutures and 6 running locked sutures.   Today, patient reports healing well. She has had a nodule come up on her left wrist. It is mildly tender to palpation, does not bother her otherwise.   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 90 tablet 3  atorvastatin  (LIPITOR) 40 MG tablet TAKE 1 TABLET(40 MG) BY MOUTH EVERY DAY 90 tablet 3  biotin 5 mg Tab Take by mouth  busPIRone (BUSPAR) 5 MG tablet Take 1 tablet (5 mg total) by mouth 2 (two) times daily as needed 30 tablet 5  calcium  lactate 100 mg calcium  Tab Take by mouth  cholecalciferol , vitamin D3, (VITAMIN D3) 125 mcg (5,000 unit) Tab Take 1 tablet by mouth every morning 90 tablet 3  co-enzyme Q-10, ubiquinone, 200 mg capsule Take 200 mg by mouth every morning.   cyanocobalamin (VITAMIN B12) 1,000 mcg SL tablet Take by mouth  ferrous sulfate  325 (65 FE) MG tablet Take 1 tablet (325 mg total) by mouth twice a week 90 tablet 3  FUROsemide  (LASIX ) 20 MG tablet Take 1-2 tablets (20-40 mg total) by mouth once daily as needed for Edema 60 tablet 4  gabapentin  (NEURONTIN ) 300 MG capsule TAKE ONE CAPSULE BY MOUTH THREE TIMES DAILY TO FOUR TIMES DAILY AS NEEDED FOR PAIN 360 capsule 3  Lactobacillus acidophilus (PROBIOTIC ORAL) Take by mouth once daily  levothyroxine  (SYNTHROID ) 50 MCG tablet Take 1.5 tablets (75 mcg total) by mouth once daily 135 tablet 3  lidocaine  (LIDODERM ) 5 % patch as needed  losartan  (COZAAR ) 100 MG tablet TAKE 1 TABLET(100 MG) BY MOUTH EVERY DAY 90 tablet 3  MAGNESIUM ORAL Take by mouth  VIT C/VIT E AC/LUT/COPPER/ZINC  (PRESERVISION LUTEIN ORAL) Take 1 tablet by mouth every morning. Last dose one week prior to procedure  ZINC  ORAL Take 50 mg by mouth once daily   No facility-administered medications prior to visit.   Objective   Vitals:  10/30/23 1350  PainSc: 0-No pain   There is no height or weight on file to calculate BMI. Home Vitals:  Physical Exam Constitutional:  General: She is not in acute distress. Appearance: Normal appearance.  HENT:  Head: Normocephalic and atraumatic.  Eyes:  General: No scleral icterus. Extraocular Movements: Extraocular movements intact.  Conjunctiva/sclera: Conjunctivae normal.   Cardiovascular:  Rate and Rhythm: Normal rate and regular rhythm.  Pulmonary:  Effort: Pulmonary effort is normal. No respiratory distress.  Breath sounds: Normal breath sounds.  Musculoskeletal:  General: Normal range of motion.  Cervical back: Normal range of motion and neck supple.  Comments: Head lacerations with good interval healing. All sutures visualized for removal.   Left hand with significant bruising from PIPs to upper wrist, interval resolution present. There is a cystic lesion, nonmobile, mildly tender to manipulation present in her posterior central wrist.  Skin: General: Skin is warm and dry.  Coloration: Skin is not jaundiced.  Findings: No erythema or rash.  Neurological:  General: No focal deficit present.  Mental Status: She is alert.  Cranial Nerves: No cranial nerve deficit.  Coordination: Coordination normal.   Assessment & Plan  Diagnoses and all orders for this visit:  Visit for suture removal - Running central scalp suture and 2x interrupted suture of right scalp removed without difficulty. Area clean with good interval healing to wound.  - Recommend gentle washing with soap and water. Avoid aggressive scrubbing. Keep area clean and dry. Reviewed warning s/s for wound healing or new onset infection requiring further evaluation.  Wrist lesion - Possible ganglion cyst vs healing blister pocket, difficult to evaluate fully with overall bruising and injury from her recent fall. Reviewed possible diagnoses. If pain, worsening swelling, numbness/tingling or movement limitations recommend earlier evaluation. Otherwise recommend evaluation by orthopedics at her follow up if symptoms do not resolve with the rest of her injuries.   PAIN:  Are you having pain? Yes: NPRS scale: 2/10 Pain location: L shoulder Pain description: sharp/aching Aggravating factors: movement/ brushing teeth Relieving factors: rest/ use of sling  PRECAUTIONS: Shoulder  RED  FLAGS: None   WEIGHT BEARING RESTRICTIONS: No  FALLS:  Has patient fallen in last 6 months? Yes. Number of falls 2  LIVING ENVIRONMENT: Lives with: lives with their spouse Lives in: House/apartment Has following equipment at home: Single point cane and Wheelchair (manual)  OCCUPATION: Retired  PLOF: Independent with household mobility with device  PATIENT GOALS:  Increase L shoulder AROM/ strength/ pain-free mobility.    NEXT MD VISIT: 11/14/23 with Dr. Lorenz Romano (ortho MD)  OBJECTIVE:  Note: Objective measures were completed at Evaluation unless otherwise noted.  DIAGNOSTIC FINDINGS:  See imaging  PATIENT SURVEYS:  FOTO initial 32/ goal 71   COGNITION: Overall cognitive status: Within functional limits for tasks assessed     SENSATION: WFL.  Moderate L hand bruising.    POSTURE: Rounded shoulders/ forward posture.  Use of L shoulder sling.    UPPER EXTREMITY ROM:  Full tear of R supraspinatus reported.    Passive ROM Right Eval AROM Left Eval PROM  Shoulder flexion 74 deg. 53 deg.  Shoulder extension    Shoulder abduction 70 deg. 42 deg.  Shoulder adduction    Shoulder internal rotation  [facility]  Shoulder external rotation  0 deg.  Elbow flexion [facility] WFL  Elbow extension [facility] WFL  Wrist flexion    Wrist extension    Wrist ulnar deviation    Wrist radial deviation    Wrist pronation    Wrist supination    (Blank rows = not tested)  UPPER EXTREMITY MMT: No MMT testing today.    SHOULDER SPECIAL TESTS: NT  JOINT MOBILITY TESTING:  No L shoulder joint mobs. Secondary to anterior dislocation.    PALPATION:  Generalized L hand tenderness/ bruising.    4/15:  Passive ROM Right seated AROM Left seated PROM  Shoulder flexion 82 deg. 95 deg.  Shoulder extension    Shoulder abduction 74 deg.  88 deg.  Shoulder adduction    Shoulder internal rotation [facility]  Shoulder external rotation 30 deg. 0 deg.  Elbow flexion [facility] WFL  Elbow extension [facility]   Wrist flexion    Wrist extension    Wrist ulnar deviation    Wrist radial deviation    Wrist pronation    Wrist supination    (Blank rows = not tested)                                                                                                             TREATMENT DATE: 02/12/24  Subjective:  Pt. Reports minimal L posterior shoulder pain prior to tx. And states she has remained active with home tasks.  Pt. Reports compliance with HEP and is tired today.    There.ex.:  Seated L shoulder AA/PROM flexion/ scaption/ punches 8x.  Cuing to hold L shoulder >90 deg. With PT assist for UE control.  Discomfort with eccentric L shoulder control.   Reassessment of L shoulder ER/IR 10x.  Nautilus: AAROM/min. Assist with all movements:  Seated lat pull down 30#  2x15  Standing tricep extension 20# 2x15 Standing bicep curls (supinated grip) 10# 2x15 Seated mid row 20#  2x15  Seated STM to L and R shoulder and UT musculature during sh. ROM reassessment.   There.act.  Standing functional reaching at rolling mirror with use of Blaze Pods (random program) with focus on L shoulder ROM below eye level.  Moderate fatigue and benefits from light PT assist.  Increase L UT compensatory patterns noted with increase fatigue.     NOT TODAY: Standing at shelves (64 in. 2nd shelf)- L overhead reaching with cones.  Light PT assist at elbow and cuing manage shoulder/UE.    Standing shoulder shrugs/ scapular retraction.  Discussed HEP  Walking in clinic with use of SPC.     PATIENT EDUCATION: Education details: Reviewed HEP Person educated: Patient and Spouse Education method: Medical illustrator Education comprehension: verbalized understanding and returned demonstration  HOME EXERCISE PROGRAM: Access Code: [email or web address] URL: [email or web address] Date: 11/06/2023 Prepared by: Hazeline Lister  Exercises - Seated Scapular Retraction  - 1 x daily - 7 x weekly - 2 sets - 10  reps - Standing Isometric Shoulder External Rotation with Doorway  - 1 x daily - 7 x weekly - 2 sets - 10 reps - 5-10sec hold - Isometric Shoulder Abduction at Wall  - 1 x daily - 7 x weekly - 2 sets - 10 reps - 5-10 sec hold - Isometric Shoulder Flexion at Wall  - 1 x daily - 7 x weekly - 2 sets - 10 reps - 5-10 sec hold   Access Code: [email or web address] URL: [email or web address] Date: 11/18/2023 Prepared by: Hazeline Lister  Exercises - Seated Scapular Retraction  - 1 x daily - 7 x weekly - 2 sets - 10 reps - Supine Shoulder External Rotation AAROM with Dowel  - 1 x daily - 7 x weekly - 3 sets - 10 reps - Supine Shoulder Flexion Overhead with Dowel  -  1 x daily - 7 x weekly - 3 sets - 10 reps - Supine Shoulder Press with Dowel  - 1 x daily - 7 x weekly - 3 sets - 10 reps - Supine Shoulder Abduction AAROM with Dowel  - 1 x daily - 7 x weekly - 3 sets - 10 reps - Supine Shoulder Flexion AAROM with Dowel  - 1 x daily - 7 x weekly - 3 sets - 10 reps  Access Code: [email or web address] URL: [email or web address] Date: 12/02/2023 Prepared by: Hazeline Lister  Exercises - Supine Shoulder External Rotation AAROM with Dowel  - 1 x daily - 5 x weekly - 3 sets - 10 reps - Supine Shoulder Flexion Overhead with Dowel  - 1 x daily - 5 x weekly - 3 sets - 10 reps - Supine Shoulder Press with Dowel  - 1 x daily - 5 x weekly - 3 sets - 10 reps - Supine Shoulder Abduction AAROM with Dowel  - 1 x daily - 5 x weekly - 3 sets - 10 reps - Supine Shoulder Flexion AAROM with Dowel  - 1 x daily - 5 x weekly - 3 sets - 10 reps - Shoulder extension with resistance - Neutral  - 1 x daily - 5 x weekly - 3 sets - 10 reps - Standing Tricep Extensions with Resistance  - 1 x daily - 5 x weekly - 3 sets - 10 reps - Standing Single Arm Bicep Curls Supinated with Dumbbell  - 1 x daily - 5 x weekly - 3 sets - 10 reps  ASSESSMENT:  CLINICAL IMPRESSION: Tx. Focused on L shoulder A/AROM in pain tolerable range and  progression of L shoulder functional reaching/ strengthening. Pt. Challenged with functional reaching tasks at end of tx. Session due to fatigue.  Pt. Remains limited by joint stiffness/ pain and generalized tenderness in R>L shoulder musculature.  Pt. Will focus on more isometric ex. Strengthening ex. at home.  Pt. Remains motivated to increase L shoulder motor control.  Pt. Will benefit from skilled PT services to increase L shoulder ROM/ strength to promote return to PLOF/ mobility with SPC.       OBJECTIVE IMPAIRMENTS: decreased activity tolerance, decreased endurance, decreased ROM, decreased strength, hypomobility, impaired flexibility, impaired sensation, improper body mechanics, postural dysfunction, and pain.   ACTIVITY LIMITATIONS: carrying, lifting, bathing, toileting, dressing, self feeding, reach over head, and hygiene/grooming  PARTICIPATION LIMITATIONS: meal prep, cleaning, laundry, and shopping  PERSONAL FACTORS: Fitness and Past/current experiences are also affecting patient's functional outcome.   REHAB POTENTIAL: Good  CLINICAL DECISION MAKING: Stable/uncomplicated  EVALUATION COMPLEXITY: Moderate   GOALS: Goals reviewed with patient? Yes  LONG TERM GOALS: Target date: 03/02/24  Pt. Able to complete HEP with no increase c/o L shoulder pain to improve L sh. Joint mobility/ stability.   Baseline:  Goal status: Partially met  2.  Pt. Will increase L shoulder AROM to >100 deg. Flexion to improve management of hair/ feeding.   Baseline: no L shoulder AROM at this time.   2/24:  pt. Challenged managing hair with compensatory movement patterns.  4/15: see above chart Goal status: Not met  3.  Pt. Able to ambulate with use of SPC on L with consistent recip. Gait pattern and on c/o L shoulder pain to improve household mobility.   Baseline: Pt. Unable to use L shoulder/ SPC at this time.  Goal status: Partially met  4.  Pt. Will be able to dress/ groom independently with no  shoulder pain/ limitations.    Baseline: limited with use of L shoulder during dressing  Goal status: Initial  PLAN:  PT FREQUENCY: 1-2x/week  PT DURATION: 4 weeks  PLANNED INTERVENTIONS: 97110-Therapeutic exercises, 97530- Therapeutic activity, [id number]- Neuromuscular re-education, 97535- Self Care, 16109- Manual therapy, Patient/Family education, Joint mobilization, DME instructions, Cryotherapy, and Moist heat  PLAN FOR NEXT SESSION:  Progress nautilus exercises to both seated and standing positions.   Lendell Quarry, PT, DPT # [phone] 02/18/2024, 6:23 PM

## 2024-02-23 ENCOUNTER — Ambulatory Visit: Attending: Orthopedic Surgery | Admitting: Physical Therapy

## 2024-02-23 DIAGNOSIS — M6281 Muscle weakness (generalized): Secondary | ICD-10-CM | POA: Diagnosis present

## 2024-02-23 DIAGNOSIS — M25612 Stiffness of left shoulder, not elsewhere classified: Secondary | ICD-10-CM | POA: Insufficient documentation

## 2024-02-23 DIAGNOSIS — M25512 Pain in left shoulder: Secondary | ICD-10-CM | POA: Insufficient documentation

## 2024-02-23 NOTE — Therapy (Signed)
 OUTPATIENT PHYSICAL THERAPY SHOULDER TREATMENT  Patient Name: Evelyn Cisneros MRN: [id number] DOB:[birth year], 83 y.o., female Today's Date: 02/23/2024  END OF SESSION:  PT End of Session - 02/23/24 1216     Visit Number 19    Number of Visits 22    Date for PT Re-Evaluation 03/02/24    PT Start Time 1036    PT Stop Time 1120    PT Time Calculation (min) 44 min            Past Medical History:  Diagnosis Date   Anemia    Arthritis    Heart murmur    Hypertension    Hypothyroidism    Spondylolisthesis of lumbar region    Past Surgical History:  Procedure Laterality Date   ABDOMINAL HYSTERECTOMY     REPLACEMENT TOTAL KNEE BILATERAL     SPINE SURGERY     Patient Active Problem List   Diagnosis Date Noted   Spondylolisthesis of lumbar region 05/04/2018   Abnormal glucose level 02/12/2018   Abnormal weight gain 02/12/2018   Allergic rhinitis 02/12/2018   Asthma 02/12/2018   Bradycardia 02/12/2018   Edema 02/12/2018   Heart murmur 02/12/2018   Hypercalcemia 02/12/2018   Hypokalemia 02/12/2018   Pure hypercholesterolemia 02/12/2018   Shoulder joint pain 02/12/2018   Skin sensation disturbance 02/12/2018   Syncope 02/12/2018   Vitamin D  deficiency 02/12/2018   Central cervical cord injury, 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, 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) 04/24/2017   Muscle spasticity 04/24/2017   Weight loss 12/19/2016   Hypothyroidism 11/24/2016   Bilateral leg edema 11/24/2016   Cervical radiculopathy 11/24/2016   Disc degeneration, lumbar 11/24/2016   Essential hypertension 11/24/2016   Gallstones 11/24/2016   History of anxiety state 11/24/2016   Hyperlipidemia, unspecified 11/24/2016   Spinal stenosis of lumbar region with neurogenic claudication 11/08/2016   Anxiety 06/08/2015   PCP: Levell Reach, MD  REFERRING PROVIDER: Neomia Banner, PA-C  REFERRING DIAG: [phone] (ICD-10-CM) - Anterior dislocation of left humerus   THERAPY DIAG:  Post-traumatic stiffness of left  shoulder joint  Acute pain of left shoulder  Muscle weakness (generalized)  Rationale for Evaluation and Treatment: Rehabilitation  ONSET DATE: 10/20/23  SUBJECTIVE:                                                                                                                                                                                      SUBJECTIVE STATEMENT: Pt. Reports falling while pushing shopping cart in parking lot at [place].  Pt. Hit head resulting in lacerations/ stitches and L anterior shoulder dislocation.  Pt. Reports 0/10 L shoulder pain at rest and 2/10 pain with eating/brushing teeth.  Pt. Was using SPC on L prior to fall but limited due to shoulder sling.  Pt. Entered PT in w/c today due to inability to use L UE at this time.  Hand dominance: Left  PERTINENT HISTORY: Subjective   Evelyn Cisneros is a 83 y.o. female in today for: HPI: History of Present Illness  Patient here today for suture removal. She underwent lac repair of her forehead after a fall on concrete 12/30. Repaired 2 lacs with 2 interrupted sutures and 6 running locked sutures.   Today, patient reports healing well. She has had a nodule come up on her left wrist. It is mildly tender to palpation, does not bother her otherwise.   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 90 tablet 3  atorvastatin  (LIPITOR) 40 MG tablet TAKE 1 TABLET(40 MG) BY MOUTH EVERY DAY 90 tablet 3  biotin 5 mg Tab Take by mouth  busPIRone (BUSPAR) 5 MG tablet Take 1 tablet (5 mg total) by mouth 2 (two) times daily as needed 30 tablet 5  calcium  lactate 100 mg calcium  Tab Take by mouth  cholecalciferol , vitamin D3, (VITAMIN D3) 125 mcg (5,000 unit) Tab Take 1 tablet by mouth every morning 90 tablet 3  co-enzyme Q-10, ubiquinone, 200 mg capsule Take 200 mg by mouth every morning.   cyanocobalamin (VITAMIN B12) 1,000 mcg SL tablet Take by mouth  ferrous sulfate  325 (65 FE) MG tablet Take 1 tablet (325 mg total) by mouth twice a week 90 tablet 3  FUROsemide  (LASIX ) 20 MG tablet Take 1-2 tablets (20-40 mg total) by mouth once daily as needed for Edema 60 tablet 4  gabapentin  (NEURONTIN ) 300 MG capsule TAKE ONE CAPSULE BY MOUTH THREE TIMES DAILY TO FOUR TIMES DAILY AS NEEDED FOR PAIN 360 capsule 3  Lactobacillus acidophilus (PROBIOTIC ORAL) Take by mouth once daily  levothyroxine  (SYNTHROID ) 50 MCG tablet Take 1.5 tablets (75 mcg total) by mouth once daily 135 tablet 3  lidocaine  (LIDODERM ) 5 % patch as needed  losartan  (COZAAR ) 100 MG tablet TAKE 1 TABLET(100 MG) BY MOUTH EVERY DAY 90 tablet 3  MAGNESIUM ORAL Take by mouth  VIT C/VIT E AC/LUT/COPPER/ZINC  (PRESERVISION LUTEIN ORAL) Take 1 tablet by mouth every morning. Last dose one week prior to procedure  ZINC  ORAL Take 50 mg by mouth once daily   No facility-administered medications prior to visit.   Objective   Vitals:  10/30/23 1350  PainSc: 0-No pain   There is no height or weight on file to calculate BMI. Home Vitals:  Physical Exam Constitutional:  General: She is not in acute distress. Appearance: Normal appearance.  HENT:  Head: Normocephalic and atraumatic.  Eyes:  General: No scleral icterus. Extraocular Movements: Extraocular movements intact.  Conjunctiva/sclera: Conjunctivae normal.   Cardiovascular:  Rate and Rhythm: Normal rate and regular rhythm.  Pulmonary:  Effort: Pulmonary effort is normal. No respiratory distress.  Breath sounds: Normal breath sounds.  Musculoskeletal:  General: Normal range of motion.  Cervical back: Normal range of motion and neck supple.  Comments: Head lacerations with good interval healing. All sutures visualized for removal.   Left hand with significant bruising from PIPs to upper wrist, interval resolution present. There is a cystic lesion, nonmobile, mildly tender to manipulation present in her posterior central wrist.  Skin: General: Skin is warm and dry.  Coloration: Skin is not jaundiced.  Findings: No erythema or rash.  Neurological:  General: No focal deficit present.  Mental Status: She is alert.  Cranial Nerves: No cranial nerve deficit.  Coordination: Coordination normal.   Assessment & Plan  Diagnoses and all orders for this visit:  Visit for suture removal - Running central scalp suture and 2x interrupted suture of right scalp removed without difficulty. Area clean with good interval healing to wound.  - Recommend gentle washing with soap and water. Avoid aggressive scrubbing. Keep area clean and dry. Reviewed warning s/s for wound healing or new onset infection requiring further evaluation.  Wrist lesion - Possible ganglion cyst vs healing blister pocket, difficult to evaluate fully with overall bruising and injury from her recent fall. Reviewed possible diagnoses. If pain, worsening swelling, numbness/tingling or movement limitations recommend earlier evaluation. Otherwise recommend evaluation by orthopedics at her follow up if symptoms do not resolve with the rest of her injuries.   PAIN:  Are you having pain? Yes: NPRS scale: 2/10 Pain location: L shoulder Pain description: sharp/aching Aggravating factors: movement/ brushing teeth Relieving factors: rest/ use of sling  PRECAUTIONS: Shoulder  RED  FLAGS: None   WEIGHT BEARING RESTRICTIONS: No  FALLS:  Has patient fallen in last 6 months? Yes. Number of falls 2  LIVING ENVIRONMENT: Lives with: lives with their spouse Lives in: House/apartment Has following equipment at home: Single point cane and Wheelchair (manual)  OCCUPATION: Retired  PLOF: Independent with household mobility with device  PATIENT GOALS:  Increase L shoulder AROM/ strength/ pain-free mobility.    NEXT MD VISIT: 11/14/23 with Dr. Lorenz Romano (ortho MD)  OBJECTIVE:  Note: Objective measures were completed at Evaluation unless otherwise noted.  DIAGNOSTIC FINDINGS:  See imaging  PATIENT SURVEYS:  FOTO initial 32/ goal 76   COGNITION: Overall cognitive status: Within functional limits for tasks assessed     SENSATION: WFL.  Moderate L hand bruising.    POSTURE: Rounded shoulders/ forward posture.  Use of L shoulder sling.    UPPER EXTREMITY ROM:  Full tear of R supraspinatus reported.    Passive ROM Right Eval AROM Left Eval PROM  Shoulder flexion 74 deg. 53 deg.  Shoulder extension    Shoulder abduction 70 deg. 42 deg.  Shoulder adduction    Shoulder internal rotation  [facility]  Shoulder external rotation  0 deg.  Elbow flexion [facility] WFL  Elbow extension [facility] WFL  Wrist flexion    Wrist extension    Wrist ulnar deviation    Wrist radial deviation    Wrist pronation    Wrist supination    (Blank rows = not tested)  UPPER EXTREMITY MMT: No MMT testing today.    SHOULDER SPECIAL TESTS: NT  JOINT MOBILITY TESTING:  No L shoulder joint mobs. Secondary to anterior dislocation.    PALPATION:  Generalized L hand tenderness/ bruising.    4/15:  Passive ROM Right seated AROM Left seated PROM  Shoulder flexion 82 deg. 95 deg.  Shoulder extension    Shoulder abduction 74 deg.  88 deg.  Shoulder adduction    Shoulder internal rotation [facility]  Shoulder external rotation 30 deg. 0 deg.  Elbow flexion [facility] WFL  Elbow extension [facility]   Wrist flexion    Wrist extension    Wrist ulnar deviation    Wrist radial deviation    Wrist pronation    Wrist supination    (Blank rows = not tested)                                                                                                             TREATMENT DATE: 02/23/2024  Subjective:  Pt. Reports minimal L posterior shoulder pain prior to tx.  Pt. Reports being sore in shoulders over weekend but states she has remained active with home tasks.    There.ex.:  Seated shoulder pulley ex.: flexion/ abduction (cuing for L shoulder isometric holds in flexion/ abduction).    Seated L shoulder AA/PROM flexion/ scaption/ punches 10x.  Cuing to hold L shoulder >90 deg. With PT assist for UE control.  Discomfort with eccentric L shoulder control.   Reassessment of L shoulder ER/IR 10x.  Seated 2# bicep curls/ sh. ER 20x.   Seated wand AAROM: chest press/ elbow flexion/ shoulder abduction 10x each.    Seated L shoulder isometrics (extension/ ER)- 10x 5 sec. Holds.    Manual tx.:  Seated STM to L and R shoulder and UT musculature during sh. ROM reassessment.   Seated L shoulder PROM (flexion/ abduction/ ER)- 6x each.  Encouraged active holds at tolerable passive end-range.     NOT TODAY  Nautilus: AAROM/min. Assist with all movements:  Seated lat pull down 30#  2x15  Standing tricep extension 20# 2x15 Standing bicep curls (supinated grip) 10# 2x15 Standing scap. retraction 30#  2x15  Standing functional reaching at rolling mirror with use of Blaze Pods (random program) with focus on L shoulder ROM below eye level.  Moderate fatigue and benefits from light PT assist.  Increase L UT compensatory patterns noted with increase fatigue.    Standing at shelves (64 in. 2nd shelf)- L overhead reaching with cones.  Light PT assist at elbow and cuing manage shoulder/UE.    Standing shoulder shrugs/ scapular retraction.  Discussed HEP  PATIENT EDUCATION: Education details: Reviewed  HEP Person educated: Patient and Spouse Education method: Medical illustrator Education comprehension: verbalized understanding and returned demonstration  HOME EXERCISE PROGRAM: Access Code: [email or web address] URL: [email or web address] Date: 11/06/2023 Prepared by: Hazeline Lister  Exercises - Seated Scapular Retraction  - 1 x daily - 7 x weekly - 2 sets - 10 reps - Standing Isometric Shoulder External Rotation with Doorway  - 1 x daily - 7 x weekly - 2 sets - 10 reps - 5-10sec hold - Isometric Shoulder Abduction at Wall  - 1 x daily - 7 x weekly - 2 sets - 10 reps - 5-10 sec hold - Isometric Shoulder Flexion at Wall  - 1 x daily - 7 x weekly - 2 sets - 10 reps - 5-10 sec hold  Access Code: [email or web address] URL: [email or web address] Date: 11/18/2023 Prepared by: Hazeline Lister  Exercises - Seated Scapular Retraction  - 1 x daily - 7 x weekly - 2 sets - 10 reps - Supine Shoulder External Rotation AAROM with Dowel  - 1 x daily - 7 x weekly - 3 sets - 10 reps - Supine Shoulder Flexion Overhead with Dowel  - 1 x daily - 7 x weekly - 3 sets - 10 reps - Supine Shoulder Press with Dowel  - 1 x daily - 7 x weekly - 3 sets - 10 reps - Supine Shoulder Abduction AAROM with Dowel  - 1 x daily - 7 x weekly - 3 sets - 10 reps - Supine Shoulder Flexion AAROM with Dowel  - 1 x daily - 7 x weekly - 3 sets - 10 reps  Access Code: [email or web address] URL: [email or web address] Date: 12/02/2023 Prepared by: Hazeline Lister  Exercises - Supine Shoulder External Rotation AAROM with Dowel  - 1 x daily - 5 x weekly - 3 sets - 10 reps - Supine Shoulder Flexion Overhead with Dowel  - 1 x daily - 5 x weekly - 3 sets - 10 reps - Supine Shoulder Press with Dowel  - 1 x daily - 5 x weekly - 3 sets - 10 reps - Supine Shoulder Abduction AAROM with Dowel  - 1 x daily - 5 x weekly - 3 sets - 10 reps - Supine Shoulder Flexion AAROM with Dowel  - 1 x daily - 5 x weekly - 3 sets - 10 reps -  Shoulder extension with resistance - Neutral  - 1 x daily - 5 x weekly - 3 sets - 10 reps - Standing Tricep Extensions with Resistance  - 1 x daily - 5 x weekly - 3 sets - 10 reps - Standing Single Arm Bicep Curls Supinated with Dumbbell  - 1 x daily - 5 x weekly - 3 sets - 10 reps  ASSESSMENT:  CLINICAL IMPRESSION: Tx. Focused on L shoulder A/AROM in pain tolerable range and progression of L shoulder functional reaching/ strengthening. Pt. Challenged with eccentric L shoulder control after sh. Flexion static holds.  Pt. Remains limited by joint stiffness/ pain and generalized tenderness in R>L shoulder musculature.  Pt. Will focus on more isometric ex. Strengthening ex. at home.  Pt. Remains motivated to increase L shoulder motor control.  Pt. Will benefit from skilled PT services to increase L shoulder ROM/ strength to promote return to PLOF/ mobility with SPC.       OBJECTIVE IMPAIRMENTS: decreased activity tolerance, decreased endurance, decreased ROM, decreased strength, hypomobility, impaired flexibility, impaired sensation, improper body mechanics, postural dysfunction, and pain.   ACTIVITY LIMITATIONS: carrying, lifting, bathing, toileting, dressing, self feeding, reach over head, and hygiene/grooming  PARTICIPATION LIMITATIONS: meal prep, cleaning, laundry, and shopping  PERSONAL FACTORS: Fitness and Past/current experiences are also affecting patient's functional outcome.   REHAB POTENTIAL: Good  CLINICAL DECISION MAKING: Stable/uncomplicated  EVALUATION COMPLEXITY: Moderate   GOALS: Goals reviewed with patient? Yes  LONG TERM GOALS: Target date: 03/02/24  Pt. Able to complete HEP with no increase c/o L shoulder pain to improve L sh. Joint mobility/ stability.   Baseline:  Goal status: Partially met  2.  Pt. Will increase L shoulder AROM to >100 deg. Flexion to improve management of hair/ feeding.   Baseline: no L shoulder AROM at this time.   2/24:  pt. Challenged managing  hair with compensatory movement patterns.  4/15: see above chart Goal  status: Not met  3.  Pt. Able to ambulate with use of SPC on L with consistent recip. Gait pattern and on c/o L shoulder pain to improve household mobility.   Baseline: Pt. Unable to use L shoulder/ SPC at this time.  Goal status: Partially met  4.  Pt. Will be able to dress/ groom independently with no shoulder pain/ limitations.    Baseline: limited with use of L shoulder during dressing  Goal status: Initial  PLAN:  PT FREQUENCY: 1-2x/week  PT DURATION: 4 weeks  PLANNED INTERVENTIONS: 97110-Therapeutic exercises, 97530- Therapeutic activity, [id number]- Neuromuscular re-education, 97535- Self Care, 16109- Manual therapy, Patient/Family education, Joint mobilization, DME instructions, Cryotherapy, and Moist heat  PLAN FOR NEXT SESSION:  Progress nautilus exercises to both seated and standing positions.  10th visit progress note   Lendell Quarry, PT, DPT # [phone] 02/23/2024, 12:18 PM

## 2024-02-25 ENCOUNTER — Ambulatory Visit: Admitting: Physical Therapy

## 2024-03-01 ENCOUNTER — Ambulatory Visit: Admitting: Physical Therapy

## 2024-03-01 ENCOUNTER — Encounter: Payer: Self-pay | Admitting: Physical Therapy

## 2024-03-01 DIAGNOSIS — M25612 Stiffness of left shoulder, not elsewhere classified: Secondary | ICD-10-CM

## 2024-03-01 DIAGNOSIS — M25512 Pain in left shoulder: Secondary | ICD-10-CM

## 2024-03-01 DIAGNOSIS — M6281 Muscle weakness (generalized): Secondary | ICD-10-CM

## 2024-03-01 NOTE — Therapy (Signed)
 OUTPATIENT PHYSICAL THERAPY SHOULDER TREATMENT/ RECERTIFICATION Physical Therapy Progress Note  Dates of reporting period  12/18/23   to   03/01/24   Patient Name: Evelyn Cisneros MRN: [id number] DOB:[birth year], 83 y.o., female Today's Date: 03/01/2024  END OF SESSION:  PT End of Session - 03/01/24 1106     Visit Number 20    Number of Visits 36    Date for PT Re-Evaluation 04/26/24    PT Start Time 1106    PT Stop Time 1155    PT Time Calculation (min) 49 min            Past Medical History:  Diagnosis Date   Anemia    Arthritis    Heart murmur    Hypertension    Hypothyroidism    Spondylolisthesis of lumbar region    Past Surgical History:  Procedure Laterality Date   ABDOMINAL HYSTERECTOMY     REPLACEMENT TOTAL KNEE BILATERAL     SPINE SURGERY     Patient Active Problem List   Diagnosis Date Noted   Spondylolisthesis of lumbar region 05/04/2018   Abnormal glucose level 02/12/2018   Abnormal weight gain 02/12/2018   Allergic rhinitis 02/12/2018   Asthma 02/12/2018   Bradycardia 02/12/2018   Edema 02/12/2018   Heart murmur 02/12/2018   Hypercalcemia 02/12/2018   Hypokalemia 02/12/2018   Pure hypercholesterolemia 02/12/2018   Shoulder joint pain 02/12/2018   Skin sensation disturbance 02/12/2018   Syncope 02/12/2018   Vitamin D  deficiency 02/12/2018   Central cervical cord injury, 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, 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) 04/24/2017   Muscle spasticity 04/24/2017   Weight loss 12/19/2016   Hypothyroidism 11/24/2016   Bilateral leg edema 11/24/2016   Cervical radiculopathy 11/24/2016   Disc degeneration, lumbar 11/24/2016   Essential hypertension 11/24/2016   Gallstones 11/24/2016   History of anxiety state 11/24/2016   Hyperlipidemia, unspecified 11/24/2016   Spinal stenosis of lumbar region with neurogenic claudication 11/08/2016   Anxiety 06/08/2015   PCP: Levell Reach, MD  REFERRING PROVIDER: Neomia Banner, PA-C  REFERRING DIAG: [phone]  (ICD-10-CM) - Anterior dislocation of left humerus   THERAPY DIAG:  Post-traumatic stiffness of left shoulder joint  Acute pain of left shoulder  Muscle weakness (generalized)  Rationale for Evaluation and Treatment: Rehabilitation  ONSET DATE: 10/20/23  SUBJECTIVE:                                                                                                                                                                                      SUBJECTIVE STATEMENT: Pt. Reports falling while pushing shopping cart in parking lot at [place].  Pt. Hit head resulting in lacerations/ stitches and  L anterior shoulder dislocation.  Pt. Reports 0/10 L shoulder pain at rest and 2/10 pain with eating/brushing teeth.  Pt. Was using SPC on L prior to fall but limited due to shoulder sling.  Pt. Entered PT in w/c today due to inability to use L UE at this time.  Hand dominance: Left  PERTINENT HISTORY: Subjective   Nakila Hamell is a 83 y.o. female in today for: HPI: History of Present Illness  Patient here today for suture removal. She underwent lac repair of her forehead after a fall on concrete 12/30. Repaired 2 lacs with 2 interrupted sutures and 6 running locked sutures.   Today, patient reports healing well. She has had a nodule come up on her left wrist. It is mildly tender to palpation, does not bother her otherwise.   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 90 tablet 3  atorvastatin  (LIPITOR) 40 MG tablet TAKE 1 TABLET(40 MG) BY MOUTH EVERY DAY 90 tablet 3  biotin 5 mg Tab Take by mouth  busPIRone (BUSPAR) 5 MG tablet Take 1 tablet (5 mg total) by mouth 2 (two) times daily as needed 30 tablet 5  calcium  lactate 100 mg calcium  Tab Take by mouth  cholecalciferol , vitamin D3, (VITAMIN D3) 125 mcg (5,000 unit) Tab Take 1 tablet by mouth every morning 90 tablet 3  co-enzyme Q-10, ubiquinone, 200 mg capsule Take 200 mg by mouth every morning.   cyanocobalamin (VITAMIN B12) 1,000 mcg SL tablet Take by mouth  ferrous sulfate  325 (65 FE) MG tablet Take 1 tablet (325 mg total) by mouth twice a week 90 tablet 3  FUROsemide  (LASIX ) 20 MG tablet Take 1-2 tablets (20-40 mg total) by mouth once daily as needed for Edema 60 tablet 4  gabapentin  (NEURONTIN ) 300 MG capsule TAKE ONE CAPSULE BY MOUTH THREE TIMES DAILY TO FOUR TIMES DAILY AS NEEDED FOR PAIN 360 capsule 3  Lactobacillus acidophilus (PROBIOTIC ORAL) Take by mouth once daily  levothyroxine  (SYNTHROID ) 50 MCG tablet Take 1.5 tablets (75 mcg total) by mouth once daily 135 tablet 3  lidocaine  (LIDODERM ) 5 % patch as needed  losartan  (COZAAR ) 100 MG tablet TAKE 1 TABLET(100 MG) BY MOUTH EVERY DAY 90 tablet 3  MAGNESIUM ORAL Take by mouth  VIT C/VIT E AC/LUT/COPPER/ZINC  (PRESERVISION LUTEIN ORAL) Take 1 tablet by mouth every morning. Last dose one week prior to procedure  ZINC  ORAL Take 50 mg by mouth once daily   No facility-administered medications prior to visit.   Objective   Vitals:  10/30/23 1350  PainSc: 0-No pain   There is no height or weight on file to calculate BMI. Home Vitals:  Physical Exam Constitutional:  General: She is not in acute distress. Appearance: Normal appearance.  HENT:  Head: Normocephalic and atraumatic.  Eyes:  General: No scleral  icterus. Extraocular Movements: Extraocular movements intact.  Conjunctiva/sclera: Conjunctivae normal.  Cardiovascular:  Rate and Rhythm: Normal rate and regular rhythm.  Pulmonary:  Effort: Pulmonary effort is normal. No respiratory distress.  Breath sounds: Normal breath sounds.  Musculoskeletal:  General: Normal range of motion.  Cervical back: Normal range of motion and neck supple.  Comments: Head lacerations with good interval healing. All sutures visualized for removal.   Left hand with significant bruising from PIPs to upper wrist, interval resolution present. There is a cystic lesion, nonmobile, mildly tender to manipulation present in her posterior central wrist.  Skin: General: Skin is warm and dry.  Coloration: Skin is not jaundiced.  Findings: No erythema or rash.  Neurological:  General: No focal deficit present.  Mental Status: She is alert.  Cranial Nerves: No cranial nerve deficit.  Coordination: Coordination normal.   Assessment & Plan  Diagnoses and all orders for this visit:  Visit for suture removal - Running central scalp suture and 2x interrupted suture of right scalp removed without difficulty. Area clean with good interval healing to wound.  - Recommend gentle washing with soap and water. Avoid aggressive scrubbing. Keep area clean and dry. Reviewed warning s/s for wound healing or new onset infection requiring further evaluation.  Wrist lesion - Possible ganglion cyst vs healing blister pocket, difficult to evaluate fully with overall bruising and injury from her recent fall. Reviewed possible diagnoses. If pain, worsening swelling, numbness/tingling or movement limitations recommend earlier evaluation. Otherwise recommend evaluation by orthopedics at her follow up if symptoms do not resolve with the rest of her injuries.   PAIN:  Are you having pain? Yes: NPRS scale: 2/10 Pain location: L shoulder Pain description: sharp/aching Aggravating factors:  movement/ brushing teeth Relieving factors: rest/ use of sling  PRECAUTIONS: Shoulder  RED FLAGS: None   WEIGHT BEARING RESTRICTIONS: No  FALLS:  Has patient fallen in last 6 months? Yes. Number of falls 2  LIVING ENVIRONMENT: Lives with: lives with their spouse Lives in: House/apartment Has following equipment at home: Single point cane and Wheelchair (manual)  OCCUPATION: Retired  PLOF: Independent with household mobility with device  PATIENT GOALS:  Increase L shoulder AROM/ strength/ pain-free mobility.    NEXT MD VISIT: 11/14/23 with Dr. Lorenz Romano (ortho MD)  OBJECTIVE:  Note: Objective measures were completed at Evaluation unless otherwise noted.  DIAGNOSTIC FINDINGS:  See imaging  PATIENT SURVEYS:  FOTO initial 32/ goal 63   COGNITION: Overall cognitive status: Within functional limits for tasks assessed     SENSATION: WFL.  Moderate L hand bruising.    POSTURE: Rounded shoulders/ forward posture.  Use of L shoulder sling.    UPPER EXTREMITY ROM:  Full tear of R supraspinatus reported.    Passive ROM Right Eval AROM Left Eval PROM  Shoulder flexion 74 deg. 53 deg.  Shoulder extension    Shoulder abduction 70 deg. 42 deg.  Shoulder adduction    Shoulder internal rotation  [facility]  Shoulder external rotation  0 deg.  Elbow flexion [facility] WFL  Elbow extension [facility] WFL  Wrist flexion    Wrist extension    Wrist ulnar deviation    Wrist radial deviation    Wrist pronation    Wrist supination    (Blank rows = not tested)  UPPER EXTREMITY MMT: No MMT testing today.    SHOULDER SPECIAL TESTS: NT  JOINT MOBILITY TESTING:  No L shoulder joint mobs. Secondary to anterior dislocation.    PALPATION:  Generalized L hand tenderness/ bruising.    4/15:  Passive ROM Right seated AROM  Left seated PROM  Shoulder flexion 82 deg. 95 deg.  Shoulder extension    Shoulder abduction 74 deg. 88 deg.  Shoulder adduction    Shoulder internal rotation [facility]   Shoulder external rotation 30 deg. 0 deg.  Elbow flexion [facility] WFL  Elbow extension [facility]  Wrist flexion    Wrist extension    Wrist ulnar deviation    Wrist radial deviation    Wrist pronation    Wrist supination    (Blank rows = not tested)                                                                                                             TREATMENT DATE: 03/01/2024  Subjective:  Pt. Reports minimal L posterior shoulder pain prior to tx.  Pt. Reports being sore in shoulders over weekend but states she has remained active with home tasks.  Pt. Missed last PT tx. Session secondary husband sick.  Pt. Had a powerful sneeze in kitchen last week resulting in R low back muscle strain/ pain.    There.ex.:  Seated shoulder pulley ex.: flexion/ abduction (cuing for L shoulder isometric holds in flexion/ abduction).    Seated B shoulder wand ex.: chest press/ sh. Flexion (PT assist)/ bicep curls (supinated)/ L shoulder ER.    Standing 3# bicep curls/ seated 2# sh. ER (limited to neutral) 20x.   Nautilus: AAROM/min. Assist with all movements:  PT assists with R hand placement/ grasp on wand.    Seated lat. pull down 30#  2x15  Standing tricep extension 20# 2x15  Seated L shoulder A/AROM: flexion (131 with AAROM and isometric hold) 5x.     Seated L shoulder isometrics (extension/ ER/ IR)- 10x 5 sec. Holds.    Manual tx.:  Seated STM to L and R shoulder and UT musculature during sh. ROM reassessment.  Moderate L shoulder/ bicep tenderness with STM.   Seated L shoulder PROM (flexion/ abduction/ ER)- 5x each.  Encouraged active holds at tolerable passive end-range.     NOT TODAY  Standing functional reaching at rolling mirror with use of Blaze Pods (random program) with focus on L shoulder ROM below eye level.  Moderate fatigue and benefits from light PT assist.  Increase L UT compensatory patterns noted with increase fatigue.    Standing at shelves (64 in. 2nd shelf)- L  overhead reaching with cones.  Light PT assist at elbow and cuing manage shoulder/UE.    Standing shoulder shrugs/ scapular retraction.  Discussed HEP  PATIENT EDUCATION: Education details: Reviewed HEP Person educated: Patient and Spouse Education method: Medical illustrator Education comprehension: verbalized understanding and returned demonstration  HOME EXERCISE PROGRAM: Access Code: [email or web address] URL: [email or web address] Date: 11/06/2023 Prepared by: Hazeline Lister  Exercises - Seated Scapular Retraction  - 1 x daily - 7 x weekly - 2 sets - 10 reps - Standing Isometric Shoulder External Rotation with Doorway  - 1 x daily - 7 x weekly - 2 sets - 10 reps - 5-10sec hold - Isometric Shoulder Abduction at  Wall  - 1 x daily - 7 x weekly - 2 sets - 10 reps - 5-10 sec hold - Isometric Shoulder Flexion at Wall  - 1 x daily - 7 x weekly - 2 sets - 10 reps - 5-10 sec hold   Access Code: [email or web address] URL: [email or web address] Date: 11/18/2023 Prepared by: Hazeline Lister  Exercises - Seated Scapular Retraction  - 1 x daily - 7 x weekly - 2 sets - 10 reps - Supine Shoulder External Rotation AAROM with Dowel  - 1 x daily - 7 x weekly - 3 sets - 10 reps - Supine Shoulder Flexion Overhead with Dowel  - 1 x daily - 7 x weekly - 3 sets - 10 reps - Supine Shoulder Press with Dowel  - 1 x daily - 7 x weekly - 3 sets - 10 reps - Supine Shoulder Abduction AAROM with Dowel  - 1 x daily - 7 x weekly - 3 sets - 10 reps - Supine Shoulder Flexion AAROM with Dowel  - 1 x daily - 7 x weekly - 3 sets - 10 reps  Access Code: [email or web address] URL: [email or web address] Date: 12/02/2023 Prepared by: Hazeline Lister  Exercises - Supine Shoulder External Rotation AAROM with Dowel  - 1 x daily - 5 x weekly - 3 sets - 10 reps - Supine Shoulder Flexion Overhead with Dowel  - 1 x daily - 5 x weekly - 3 sets - 10 reps - Supine Shoulder Press with Dowel  - 1 x daily - 5 x  weekly - 3 sets - 10 reps - Supine Shoulder Abduction AAROM with Dowel  - 1 x daily - 5 x weekly - 3 sets - 10 reps - Supine Shoulder Flexion AAROM with Dowel  - 1 x daily - 5 x weekly - 3 sets - 10 reps - Shoulder extension with resistance - Neutral  - 1 x daily - 5 x weekly - 3 sets - 10 reps - Standing Tricep Extensions with Resistance  - 1 x daily - 5 x weekly - 3 sets - 10 reps - Standing Single Arm Bicep Curls Supinated with Dumbbell  - 1 x daily - 5 x weekly - 3 sets - 10 reps  ASSESSMENT:  CLINICAL IMPRESSION: Tx. Focused on L shoulder A/AROM in pain tolerable range and progression of L shoulder functional reaching/ strengthening. Pt. Challenged with eccentric L shoulder control after sh. Flexion static holds.  Pt. Remains limited by joint stiffness/ pain and generalized tenderness in B shoulder musculature.  Pt. Will focus on more isometric ex. Strengthening ex. at home.  Pt. Remains motivated to increase L shoulder motor control.  See updated PT goals.  Pt. Will benefit from skilled PT services to increase L shoulder ROM/ strength to promote return to PLOF/ mobility with SPC.       OBJECTIVE IMPAIRMENTS: decreased activity tolerance, decreased endurance, decreased ROM, decreased strength, hypomobility, impaired flexibility, impaired sensation, improper body mechanics, postural dysfunction, and pain.   ACTIVITY LIMITATIONS: carrying, lifting, bathing, toileting, dressing, self feeding, reach over head, and hygiene/grooming  PARTICIPATION LIMITATIONS: meal prep, cleaning, laundry, and shopping  PERSONAL FACTORS: Fitness and Past/current experiences are also affecting patient's functional outcome.   REHAB POTENTIAL: Good  CLINICAL DECISION MAKING: Stable/uncomplicated  EVALUATION COMPLEXITY: Moderate   GOALS: Goals reviewed with patient? Yes  LONG TERM GOALS: Target date: 04/26/24  Pt. Able to complete HEP with no increase c/o L shoulder pain to improve L sh. Joint mobility/  stability.   Baseline:  5/12: minimal c/o L shoulder pain/ soreness Goal status: Partially met  2.  Pt. Will increase L shoulder AROM to >100 deg. Flexion to improve management of hair/ feeding.   Baseline: no L shoulder AROM at this time.   2/24:  pt. Challenged managing hair with compensatory movement patterns.  4/15: see above chart.  5/12: standing AAROM with isometric hold at 115 deg.  AROM <90 deg. Goal status: Not met  3.  Pt. Able to ambulate with use of SPC on L with consistent recip. Gait pattern and on c/o L shoulder pain to improve household mobility.   Baseline: Pt. Unable to use L shoulder/ SPC at this time.  5/12: pt. Ambulate with mod. I and use of SPC on L.  No recent falls.  Goal status: Goal met  4.  Pt. Will be able to dress/ groom independently with no shoulder pain/ limitations.    Baseline: limited with use of L shoulder during dressing.  5/12: limited with L shoulder IR behind back and bring L hand to head.    Goal status: Partially met  PLAN:  PT FREQUENCY: 1-2x/week  PT DURATION: 8 weeks  PLANNED INTERVENTIONS: 97110-Therapeutic exercises, 97530- Therapeutic activity, [id number]- Neuromuscular re-education, 97535- Self Care, 16109- Manual therapy, Patient/Family education, Joint mobilization, DME instructions, Cryotherapy, and Moist heat  PLAN FOR NEXT SESSION:  Progress nautilus exercises to both seated and standing positions.    Lendell Quarry, PT, DPT # [phone] 03/01/2024, 12:29 PM

## 2024-03-02 NOTE — Therapy (Signed)
 OUTPATIENT PHYSICAL THERAPY SHOULDER TREATMENT  Patient Name: Evelyn Cisneros MRN: [id number] DOB:05/22/1941, 83 y.o., female Today's Date: 03/03/2024  END OF SESSION:  PT End of Session - 03/03/24 1109     Visit Number 21    Number of Visits 36    Date for PT Re-Evaluation 04/26/24    PT Start Time 1110    PT Stop Time 1150    PT Time Calculation (min) 40 min    Activity Tolerance Patient tolerated treatment well    Behavior During Therapy WFL for tasks assessed/performed             Past Medical History:  Diagnosis Date   Anemia    Arthritis    Heart murmur    Hypertension    Hypothyroidism    Spondylolisthesis of lumbar region    Past Surgical History:  Procedure Laterality Date   ABDOMINAL HYSTERECTOMY     REPLACEMENT TOTAL KNEE BILATERAL     SPINE SURGERY     Patient Active Problem List   Diagnosis Date Noted   Spondylolisthesis of lumbar region 05/04/2018   Abnormal glucose level 02/12/2018   Abnormal weight gain 02/12/2018   Allergic rhinitis 02/12/2018   Asthma 02/12/2018   Bradycardia 02/12/2018   Edema 02/12/2018   Heart murmur 02/12/2018   Hypercalcemia 02/12/2018   Hypokalemia 02/12/2018   Pure hypercholesterolemia 02/12/2018   Shoulder joint pain 02/12/2018   Skin sensation disturbance 02/12/2018   Syncope 02/12/2018   Vitamin D  deficiency 02/12/2018   Central cervical cord injury, 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, 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) 04/24/2017   Muscle spasticity 04/24/2017   Weight loss 12/19/2016   Hypothyroidism 11/24/2016   Bilateral leg edema 11/24/2016   Cervical radiculopathy 11/24/2016   Disc degeneration, lumbar 11/24/2016   Essential hypertension 11/24/2016   Gallstones 11/24/2016   History of anxiety state 11/24/2016   Hyperlipidemia, unspecified 11/24/2016   Spinal stenosis of lumbar region with neurogenic claudication 11/08/2016   Anxiety 06/08/2015   PCP: Evelyn Reach, MD  REFERRING PROVIDER: Neomia Banner, PA-C  REFERRING  DIAG: [phone] (ICD-10-CM) - Anterior dislocation of left humerus   THERAPY DIAG:  Post-traumatic stiffness of left shoulder joint  Acute pain of left shoulder  Muscle weakness (generalized)  Rationale for Evaluation and Treatment: Rehabilitation  ONSET DATE: 10/20/23  SUBJECTIVE:                                                                                                                                                                                      SUBJECTIVE STATEMENT: Pt. Reports falling while pushing shopping cart in parking lot at [place].  Pt. Hit head resulting in lacerations/ stitches  and L anterior shoulder dislocation.  Pt. Reports 0/10 L shoulder pain at rest and 2/10 pain with eating/brushing teeth.  Pt. Was using SPC on L prior to fall but limited due to shoulder sling.  Pt. Entered PT in w/c today due to inability to use L UE at this time.  Hand dominance: Left  PERTINENT HISTORY: Subjective   Evelyn Cisneros is a 83 y.o. female in today for: HPI: History of Present Illness  Patient here today for suture removal. She underwent lac repair of her forehead after a fall on concrete 12/30. Repaired 2 lacs with 2 interrupted sutures and 6 running locked sutures.   Today, patient reports healing well. She has had a nodule come up on her left wrist. It is mildly tender to palpation, does not bother her otherwise.   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 90 tablet 3  atorvastatin  (LIPITOR) 40 MG tablet TAKE 1 TABLET(40 MG) BY MOUTH EVERY DAY 90 tablet 3  biotin 5 mg Tab Take by mouth  busPIRone (BUSPAR) 5 MG tablet Take 1 tablet (5 mg total) by mouth 2 (two) times daily as needed 30 tablet 5  calcium  lactate 100 mg calcium  Tab Take by mouth  cholecalciferol , vitamin D3, (VITAMIN D3) 125 mcg (5,000 unit) Tab Take 1 tablet by mouth every morning 90 tablet 3  co-enzyme Q-10, ubiquinone, 200 mg capsule Take 200 mg by mouth every morning.   cyanocobalamin (VITAMIN B12) 1,000 mcg SL tablet Take by mouth  ferrous sulfate  325 (65 FE) MG tablet Take 1 tablet (325 mg total) by mouth twice a week 90 tablet 3  FUROsemide  (LASIX ) 20 MG tablet Take 1-2 tablets (20-40 mg total) by mouth once daily as needed for Edema 60 tablet 4  gabapentin  (NEURONTIN ) 300 MG capsule TAKE ONE CAPSULE BY MOUTH THREE TIMES DAILY TO FOUR TIMES DAILY AS NEEDED FOR PAIN 360 capsule 3  Lactobacillus acidophilus (PROBIOTIC ORAL) Take by mouth once daily  levothyroxine  (SYNTHROID ) 50 MCG tablet Take 1.5 tablets (75 mcg total) by mouth once daily 135 tablet 3  lidocaine  (LIDODERM ) 5 % patch as needed  losartan  (COZAAR ) 100 MG tablet TAKE 1 TABLET(100 MG) BY MOUTH EVERY DAY 90 tablet 3  MAGNESIUM ORAL Take by mouth  VIT C/VIT E AC/LUT/COPPER/ZINC  (PRESERVISION LUTEIN ORAL) Take 1 tablet by mouth every morning. Last dose one week prior to procedure  ZINC  ORAL Take 50 mg by mouth once daily   No facility-administered medications prior to visit.   Objective   Vitals:  10/30/23 1350  PainSc: 0-No pain   There is no height or weight on file to calculate BMI. Home Vitals:  Physical Exam Constitutional:  General: She is not in acute distress. Appearance: Normal appearance.  HENT:  Head: Normocephalic and atraumatic.  Eyes:  General: No  scleral icterus. Extraocular Movements: Extraocular movements intact.  Conjunctiva/sclera: Conjunctivae normal.  Cardiovascular:  Rate and Rhythm: Normal rate and regular rhythm.  Pulmonary:  Effort: Pulmonary effort is normal. No respiratory distress.  Breath sounds: Normal breath sounds.  Musculoskeletal:  General: Normal range of motion.  Cervical back: Normal range of motion and neck supple.  Comments: Head lacerations with good interval healing. All sutures visualized for removal.   Left hand with significant bruising from PIPs to upper wrist, interval resolution present. There is a cystic lesion, nonmobile, mildly tender to manipulation present in her posterior central wrist.  Skin: General: Skin is warm and dry.  Coloration: Skin is not jaundiced.  Findings: No erythema or rash.  Neurological:  General: No focal deficit present.  Mental Status: She is alert.  Cranial Nerves: No cranial nerve deficit.  Coordination: Coordination normal.   Assessment & Plan  Diagnoses and all orders for this visit:  Visit for suture removal - Running central scalp suture and 2x interrupted suture of right scalp removed without difficulty. Area clean with good interval healing to wound.  - Recommend gentle washing with soap and water. Avoid aggressive scrubbing. Keep area clean and dry. Reviewed warning s/s for wound healing or new onset infection requiring further evaluation.  Wrist lesion - Possible ganglion cyst vs healing blister pocket, difficult to evaluate fully with overall bruising and injury from her recent fall. Reviewed possible diagnoses. If pain, worsening swelling, numbness/tingling or movement limitations recommend earlier evaluation. Otherwise recommend evaluation by orthopedics at her follow up if symptoms do not resolve with the rest of her injuries.   PAIN:  Are you having pain? Yes: NPRS scale: 2/10 Pain location: L shoulder Pain description: sharp/aching Aggravating  factors: movement/ brushing teeth Relieving factors: rest/ use of sling  PRECAUTIONS: Shoulder  RED FLAGS: None   WEIGHT BEARING RESTRICTIONS: No  FALLS:  Has patient fallen in last 6 months? Yes. Number of falls 2  LIVING ENVIRONMENT: Lives with: lives with their spouse Lives in: House/apartment Has following equipment at home: Single point cane and Wheelchair (manual)  OCCUPATION: Retired  PLOF: Independent with household mobility with device  PATIENT GOALS:  Increase L shoulder AROM/ strength/ pain-free mobility.    NEXT MD VISIT: 11/14/23 with Dr. Lorenz Romano (ortho MD)  OBJECTIVE:  Note: Objective measures were completed at Evaluation unless otherwise noted.  DIAGNOSTIC FINDINGS:  See imaging  PATIENT SURVEYS:  FOTO initial 32/ goal 65   COGNITION: Overall cognitive status: Within functional limits for tasks assessed     SENSATION: WFL.  Moderate L hand bruising.    POSTURE: Rounded shoulders/ forward posture.  Use of L shoulder sling.    UPPER EXTREMITY ROM:  Full tear of R supraspinatus reported.    Passive ROM Right Eval AROM Left Eval PROM  Shoulder flexion 74 deg. 53 deg.  Shoulder extension    Shoulder abduction 70 deg. 42 deg.  Shoulder adduction    Shoulder internal rotation  [facility]  Shoulder external rotation  0 deg.  Elbow flexion [facility] WFL  Elbow extension [facility] WFL  Wrist flexion    Wrist extension    Wrist ulnar deviation    Wrist radial deviation    Wrist pronation    Wrist supination    (Blank rows = not tested)  UPPER EXTREMITY MMT: No MMT testing today.    SHOULDER SPECIAL TESTS: NT  JOINT MOBILITY TESTING:  No L shoulder joint mobs. Secondary to anterior dislocation.    PALPATION:  Generalized L hand tenderness/ bruising.    4/15:  Passive ROM Right  seated AROM Left seated PROM  Shoulder flexion 82 deg. 95 deg.  Shoulder extension    Shoulder abduction 74 deg. 88 deg.  Shoulder adduction    Shoulder internal rotation  [facility]  Shoulder external rotation 30 deg. 0 deg.  Elbow flexion [facility] WFL  Elbow extension [facility]  Wrist flexion    Wrist extension    Wrist ulnar deviation    Wrist radial deviation    Wrist pronation    Wrist supination    (Blank rows = not tested)                                                                                                             TREATMENT DATE: 03/03/2024  Subjective:  Patient reports her L shoulder is pretty painful this date due to tossing something into trash to the side (abduction and IR)    There.ex  Seated shoulder pulley ex.: flexion/ abduction (cuing for L shoulder isometric holds in flexion/ abduction).    Seated B shoulder wand ex.: chest press/ sh. Flexion (PT assist)/ bicep curls (supinated)/ L shoulder ER.    Standing 3# bicep curls/1# shoulder flex 20x.   Nautilus: AAROM/min. Assist with all movements:  PT assists with R hand placement/ grasp on wand.    Seated lat. pull down 30#  2x15  Standing tricep extension 20# 2x15  Manual tx.:  Seated STM to L and R shoulder and UT musculature during sh. ROM reassessment.  Moderate L shoulder/ bicep tenderness with STM.   Seated L shoulder PROM (flexion/ abduction/ ER)- 5x each.  Encouraged active holds at tolerable passive end-range.     NOT TODAY  Standing functional reaching at rolling mirror with use of Blaze Pods (random program) with focus on L shoulder ROM below eye level.  Moderate fatigue and benefits from light PT assist.  Increase L UT compensatory patterns noted with increase fatigue.    Standing at shelves (64 in. 2nd shelf)- L overhead reaching with cones.  Light PT assist at elbow and cuing manage shoulder/UE.    Standing shoulder shrugs/ scapular retraction.  Discussed HEP  PATIENT EDUCATION: Education details: Reviewed HEP Person educated: Patient and Spouse Education method: Medical illustrator Education comprehension: verbalized understanding and returned  demonstration  HOME EXERCISE PROGRAM: Access Code: [email or web address] URL: [email or web address] Date: 11/06/2023 Prepared by: Hazeline Lister  Exercises - Seated Scapular Retraction  - 1 x daily - 7 x weekly - 2 sets - 10 reps - Standing Isometric Shoulder External Rotation with Doorway  - 1 x daily - 7 x weekly - 2 sets - 10 reps - 5-10sec hold - Isometric Shoulder Abduction at Wall  - 1 x daily - 7 x weekly - 2 sets - 10 reps - 5-10 sec hold - Isometric Shoulder Flexion at Wall  - 1 x daily - 7 x weekly - 2 sets - 10 reps - 5-10 sec hold   Access Code: [email or web address] URL: [email or web address] Date: 11/18/2023 Prepared by: Hazeline Lister  Exercises - Seated Scapular Retraction  - 1  x daily - 7 x weekly - 2 sets - 10 reps - Supine Shoulder External Rotation AAROM with Dowel  - 1 x daily - 7 x weekly - 3 sets - 10 reps - Supine Shoulder Flexion Overhead with Dowel  - 1 x daily - 7 x weekly - 3 sets - 10 reps - Supine Shoulder Press with Dowel  - 1 x daily - 7 x weekly - 3 sets - 10 reps - Supine Shoulder Abduction AAROM with Dowel  - 1 x daily - 7 x weekly - 3 sets - 10 reps - Supine Shoulder Flexion AAROM with Dowel  - 1 x daily - 7 x weekly - 3 sets - 10 reps  Access Code: [email or web address] URL: [email or web address] Date: 12/02/2023 Prepared by: Hazeline Lister  Exercises - Supine Shoulder External Rotation AAROM with Dowel  - 1 x daily - 5 x weekly - 3 sets - 10 reps - Supine Shoulder Flexion Overhead with Dowel  - 1 x daily - 5 x weekly - 3 sets - 10 reps - Supine Shoulder Press with Dowel  - 1 x daily - 5 x weekly - 3 sets - 10 reps - Supine Shoulder Abduction AAROM with Dowel  - 1 x daily - 5 x weekly - 3 sets - 10 reps - Supine Shoulder Flexion AAROM with Dowel  - 1 x daily - 5 x weekly - 3 sets - 10 reps - Shoulder extension with resistance - Neutral  - 1 x daily - 5 x weekly - 3 sets - 10 reps - Standing Tricep Extensions with Resistance  - 1 x daily - 5  x weekly - 3 sets - 10 reps - Standing Single Arm Bicep Curls Supinated with Dumbbell  - 1 x daily - 5 x weekly - 3 sets - 10 reps  ASSESSMENT:  CLINICAL IMPRESSION:  Tx. Focused on L shoulder A/AROM in pain tolerable range and progression of L shoulder functional reaching/ strengthening. Pt. Challenged with eccentric L shoulder control after sh. Flexion static holds.  Pt. Remains limited by joint stiffness/ pain and generalized tenderness in B shoulder musculature.  Pt. Remains motivated to increase L shoulder motor control.  See updated PT goals.  Pt. Will benefit from skilled PT services to increase L shoulder ROM/ strength to promote return to PLOF/ mobility with SPC.       OBJECTIVE IMPAIRMENTS: decreased activity tolerance, decreased endurance, decreased ROM, decreased strength, hypomobility, impaired flexibility, impaired sensation, improper body mechanics, postural dysfunction, and pain.   ACTIVITY LIMITATIONS: carrying, lifting, bathing, toileting, dressing, self feeding, Cisneros over head, and hygiene/grooming  PARTICIPATION LIMITATIONS: meal prep, cleaning, laundry, and shopping  PERSONAL FACTORS: Fitness and Past/current experiences are also affecting patient's functional outcome.   REHAB POTENTIAL: Good  CLINICAL DECISION MAKING: Stable/uncomplicated  EVALUATION COMPLEXITY: Moderate   GOALS: Goals reviewed with patient? Yes  LONG TERM GOALS: Target date: 04/26/24  Pt. Able to complete HEP with no increase c/o L shoulder pain to improve L sh. Joint mobility/ stability.   Baseline: 5/12: minimal c/o L shoulder pain/ soreness Goal status: Partially met  2.  Pt. Will increase L shoulder AROM to >100 deg. Flexion to improve management of hair/ feeding.   Baseline: no L shoulder AROM at this time.   2/24:  pt. Challenged managing hair with compensatory movement patterns.  4/15: see above chart.  5/12: standing AAROM with isometric hold at 115 deg.  AROM <90 deg. Goal status: Not  met  3.  Pt. Able to ambulate with use of SPC on L with consistent recip. Gait pattern and on c/o L shoulder pain to improve household mobility.   Baseline: Pt. Unable to use L shoulder/ SPC at this time.  5/12: pt. Ambulate with mod. I and use of SPC on L.  No recent falls.  Goal status: Goal met  4.  Pt. Will be able to dress/ groom independently with no shoulder pain/ limitations.    Baseline: limited with use of L shoulder during dressing.  5/12: limited with L shoulder IR behind back and bring L hand to head.    Goal status: Partially met  PLAN:  PT FREQUENCY: 1-2x/week  PT DURATION: 8 weeks  PLANNED INTERVENTIONS: 97110-Therapeutic exercises, 97530- Therapeutic activity, [id number]- Neuromuscular re-education, 97535- Self Care, [id number]- Manual therapy, Patient/Family education, Joint mobilization, DME instructions, Cryotherapy, and Moist heat  PLAN FOR NEXT SESSION:  Progress nautilus exercises to both seated and standing positions.    Janine Melbourne, PT, DPT Physical Therapist - [facility] 03/03/2024, 11:10 AM

## 2024-03-03 ENCOUNTER — Ambulatory Visit

## 2024-03-03 ENCOUNTER — Encounter: Payer: Self-pay | Admitting: Physical Therapy

## 2024-03-03 DIAGNOSIS — M6281 Muscle weakness (generalized): Secondary | ICD-10-CM

## 2024-03-03 DIAGNOSIS — M25612 Stiffness of left shoulder, not elsewhere classified: Secondary | ICD-10-CM | POA: Diagnosis not present

## 2024-03-03 DIAGNOSIS — M25512 Pain in left shoulder: Secondary | ICD-10-CM

## 2024-03-08 ENCOUNTER — Ambulatory Visit: Admitting: Physical Therapy

## 2024-03-08 ENCOUNTER — Encounter: Payer: Self-pay | Admitting: Physical Therapy

## 2024-03-08 DIAGNOSIS — M25512 Pain in left shoulder: Secondary | ICD-10-CM

## 2024-03-08 DIAGNOSIS — M25612 Stiffness of left shoulder, not elsewhere classified: Secondary | ICD-10-CM

## 2024-03-08 DIAGNOSIS — M6281 Muscle weakness (generalized): Secondary | ICD-10-CM

## 2024-03-08 NOTE — Therapy (Signed)
 OUTPATIENT PHYSICAL THERAPY SHOULDER TREATMENT  Patient Name: Evelyn Cisneros MRN: [id number] DOB:04-05-1941, 83 y.o., female Today's Date: 03/08/2024  END OF SESSION:  PT End of Session - 03/08/24 1114     Visit Number 22    Number of Visits 36    Date for PT Re-Evaluation 04/26/24    PT Start Time 1114    PT Stop Time 1202    PT Time Calculation (min) 48 min    Activity Tolerance Patient tolerated treatment well    Behavior During Therapy WFL for tasks assessed/performed             Past Medical History:  Diagnosis Date   Anemia    Arthritis    Heart murmur    Hypertension    Hypothyroidism    Spondylolisthesis of lumbar region    Past Surgical History:  Procedure Laterality Date   ABDOMINAL HYSTERECTOMY     REPLACEMENT TOTAL KNEE BILATERAL     SPINE SURGERY     Patient Active Problem List   Diagnosis Date Noted   Spondylolisthesis of lumbar region 05/04/2018   Abnormal glucose level 02/12/2018   Abnormal weight gain 02/12/2018   Allergic rhinitis 02/12/2018   Asthma 02/12/2018   Bradycardia 02/12/2018   Edema 02/12/2018   Heart murmur 02/12/2018   Hypercalcemia 02/12/2018   Hypokalemia 02/12/2018   Pure hypercholesterolemia 02/12/2018   Shoulder joint pain 02/12/2018   Skin sensation disturbance 02/12/2018   Syncope 02/12/2018   Vitamin D  deficiency 02/12/2018   Central cervical cord injury, 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, 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) 04/24/2017   Muscle spasticity 04/24/2017   Weight loss 12/19/2016   Hypothyroidism 11/24/2016   Bilateral leg edema 11/24/2016   Cervical radiculopathy 11/24/2016   Disc degeneration, lumbar 11/24/2016   Essential hypertension 11/24/2016   Gallstones 11/24/2016   History of anxiety state 11/24/2016   Hyperlipidemia, unspecified 11/24/2016   Spinal stenosis of lumbar region with neurogenic claudication 11/08/2016   Anxiety 06/08/2015   PCP: Levell Reach, MD  REFERRING PROVIDER: Neomia Banner, PA-C  REFERRING  DIAG: [id number] (ICD-10-CM) - Anterior dislocation of left humerus   THERAPY DIAG:  Post-traumatic stiffness of left shoulder joint  Acute pain of left shoulder  Muscle weakness (generalized)  Rationale for Evaluation and Treatment: Rehabilitation  ONSET DATE: 10/20/23  SUBJECTIVE:                                                                                                                                                                                      SUBJECTIVE STATEMENT: Pt. Reports falling while pushing shopping cart in parking lot at [place].  Pt. Hit head resulting in lacerations/ stitches  and L anterior shoulder dislocation.  Pt. Reports 0/10 L shoulder pain at rest and 2/10 pain with eating/brushing teeth.  Pt. Was using SPC on L prior to fall but limited due to shoulder sling.  Pt. Entered PT in w/c today due to inability to use L UE at this time.  Hand dominance: Left  PERTINENT HISTORY: Subjective   Evelyn Cisneros is a 83 y.o. female in today for: HPI: History of Present Illness  Patient here today for suture removal. She underwent lac repair of her forehead after a fall on concrete 12/30. Repaired 2 lacs with 2 interrupted sutures and 6 running locked sutures.   Today, patient reports healing well. She has had a nodule come up on her left wrist. It is mildly tender to palpation, does not bother her otherwise.   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 90 tablet 3  atorvastatin  (LIPITOR) 40 MG tablet TAKE 1 TABLET(40 MG) BY MOUTH EVERY DAY 90 tablet 3  biotin 5 mg Tab Take by mouth  busPIRone (BUSPAR) 5 MG tablet Take 1 tablet (5 mg total) by mouth 2 (two) times daily as needed 30 tablet 5  calcium  lactate 100 mg calcium  Tab Take by mouth  cholecalciferol , vitamin D3, (VITAMIN D3) 125 mcg (5,000 unit) Tab Take 1 tablet by mouth every morning 90 tablet 3  co-enzyme Q-10, ubiquinone, 200 mg capsule Take 200 mg by mouth every morning.   cyanocobalamin (VITAMIN B12) 1,000 mcg SL tablet Take by mouth  ferrous sulfate  325 (65 FE) MG tablet Take 1 tablet (325 mg total) by mouth twice a week 90 tablet 3  FUROsemide  (LASIX ) 20 MG tablet Take 1-2 tablets (20-40 mg total) by mouth once daily as needed for Edema 60 tablet 4  gabapentin  (NEURONTIN ) 300 MG capsule TAKE ONE CAPSULE BY MOUTH THREE TIMES DAILY TO FOUR TIMES DAILY AS NEEDED FOR PAIN 360 capsule 3  Lactobacillus acidophilus (PROBIOTIC ORAL) Take by mouth once daily  levothyroxine  (SYNTHROID ) 50 MCG tablet Take 1.5 tablets (75 mcg total) by mouth once daily 135 tablet 3  lidocaine  (LIDODERM ) 5 % patch as needed  losartan  (COZAAR ) 100 MG tablet TAKE 1 TABLET(100 MG) BY MOUTH EVERY DAY 90 tablet 3  MAGNESIUM ORAL Take by mouth  VIT C/VIT E AC/LUT/COPPER/ZINC  (PRESERVISION LUTEIN ORAL) Take 1 tablet by mouth every morning. Last dose one week prior to procedure  ZINC  ORAL Take 50 mg by mouth once daily   No facility-administered medications prior to visit.   Objective   Vitals:  10/30/23 1350  PainSc: 0-No pain   There is no height or weight on file to calculate BMI. Home Vitals:  Physical Exam Constitutional:  General: She is not in acute distress. Appearance: Normal appearance.  HENT:  Head: Normocephalic and atraumatic.  Eyes:  General: No  scleral icterus. Extraocular Movements: Extraocular movements intact.  Conjunctiva/sclera: Conjunctivae normal.  Cardiovascular:  Rate and Rhythm: Normal rate and regular rhythm.  Pulmonary:  Effort: Pulmonary effort is normal. No respiratory distress.  Breath sounds: Normal breath sounds.  Musculoskeletal:  General: Normal range of motion.  Cervical back: Normal range of motion and neck supple.  Comments: Head lacerations with good interval healing. All sutures visualized for removal.   Left hand with significant bruising from PIPs to upper wrist, interval resolution present. There is a cystic lesion, nonmobile, mildly tender to manipulation present in her posterior central wrist.  Skin: General: Skin is warm and dry.  Coloration: Skin is not jaundiced.  Findings: No erythema or rash.  Neurological:  General: No focal deficit present.  Mental Status: She is alert.  Cranial Nerves: No cranial nerve deficit.  Coordination: Coordination normal.   Assessment & Plan  Diagnoses and all orders for this visit:  Visit for suture removal - Running central scalp suture and 2x interrupted suture of right scalp removed without difficulty. Area clean with good interval healing to wound.  - Recommend gentle washing with soap and water. Avoid aggressive scrubbing. Keep area clean and dry. Reviewed warning s/s for wound healing or new onset infection requiring further evaluation.  Wrist lesion - Possible ganglion cyst vs healing blister pocket, difficult to evaluate fully with overall bruising and injury from her recent fall. Reviewed possible diagnoses. If pain, worsening swelling, numbness/tingling or movement limitations recommend earlier evaluation. Otherwise recommend evaluation by orthopedics at her follow up if symptoms do not resolve with the rest of her injuries.   PAIN:  Are you having pain? Yes: NPRS scale: 2/10 Pain location: L shoulder Pain description: sharp/aching Aggravating  factors: movement/ brushing teeth Relieving factors: rest/ use of sling  PRECAUTIONS: Shoulder  RED FLAGS: None   WEIGHT BEARING RESTRICTIONS: No  FALLS:  Has patient fallen in last 6 months? Yes. Number of falls 2  LIVING ENVIRONMENT: Lives with: lives with their spouse Lives in: House/apartment Has following equipment at home: Single point cane and Wheelchair (manual)  OCCUPATION: Retired  PLOF: Independent with household mobility with device  PATIENT GOALS:  Increase L shoulder AROM/ strength/ pain-free mobility.    NEXT MD VISIT: 11/14/23 with Dr. Lorenz Romano (ortho MD)  OBJECTIVE:  Note: Objective measures were completed at Evaluation unless otherwise noted.  DIAGNOSTIC FINDINGS:  See imaging  PATIENT SURVEYS:  FOTO initial 32/ goal 55   COGNITION: Overall cognitive status: Within functional limits for tasks assessed     SENSATION: WFL.  Moderate L hand bruising.    POSTURE: Rounded shoulders/ forward posture.  Use of L shoulder sling.    UPPER EXTREMITY ROM:  Full tear of R supraspinatus reported.    Passive ROM Right Eval AROM Left Eval PROM  Shoulder flexion 74 deg. 53 deg.  Shoulder extension    Shoulder abduction 70 deg. 42 deg.  Shoulder adduction    Shoulder internal rotation  [facility]  Shoulder external rotation  0 deg.  Elbow flexion [facility] WFL  Elbow extension [facility] WFL  Wrist flexion    Wrist extension    Wrist ulnar deviation    Wrist radial deviation    Wrist pronation    Wrist supination    (Blank rows = not tested)  UPPER EXTREMITY MMT: No MMT testing today.    SHOULDER SPECIAL TESTS: NT  JOINT MOBILITY TESTING:  No L shoulder joint mobs. Secondary to anterior dislocation.    PALPATION:  Generalized L hand tenderness/ bruising.    4/15:  Passive ROM Right  seated AROM Left seated PROM  Shoulder flexion 82 deg. 95 deg.  Shoulder extension    Shoulder abduction 74 deg. 88 deg.  Shoulder adduction    Shoulder internal rotation  [facility]  Shoulder external rotation 30 deg. 0 deg.  Elbow flexion [facility] WFL  Elbow extension [facility]  Wrist flexion    Wrist extension    Wrist ulnar deviation    Wrist radial deviation    Wrist pronation    Wrist supination    (Blank rows = not tested)                                                                                                             TREATMENT DATE: 03/08/2024  Subjective:  Pt. states she has been able to reach knob on stove for the 1st time in a long time.  Pt. limited with L shoulder abduction (throwing trash in garbage) due to pain/ limited ROM.     There. Act.    Seated L shoulder AAROM (no wand):  chest press/ sh. Flexion (PT assist)/ bicep curls (supinated)/ L shoulder ER.    Seated L shoulder manual isometrics (IR/ER)  Nautilus: AAROM/min. Assist with all movements:  PT assists with R hand placement/ grasp on wand.    Standing functional reaching with hospital table/ varying position for cones.    Seated lat. pull down 30#  20x  Standing tricep extension 20# 20x Standing bicep curls 10# 20x. Standing scap. Retraction 30# 20x.    PT issued RTB for HEP.    Manual tx.:  Seated STM to L and R shoulder and UT musculature during sh. ROM reassessment.  Moderate L shoulder/ bicep tenderness with STM.   Seated L shoulder PROM (flexion/ abduction/ ER)- 5x each.  Encouraged active holds at tolerable passive end-range.     NOT TODAY  Seated shoulder pulley ex.: flexion/ abduction (cuing for L shoulder isometric holds in flexion/ abduction).    Standing functional reaching at rolling mirror with use of Blaze Pods (random program) with focus on L shoulder ROM below eye level.  Moderate fatigue and benefits from light PT assist.  Increase L UT compensatory patterns noted with increase fatigue.    Standing at shelves (64 in. 2nd shelf)- L overhead reaching with cones.  Light PT assist at elbow and cuing manage shoulder/UE.    Standing shoulder shrugs/  scapular retraction.  Discussed HEP  PATIENT EDUCATION: Education details: Reviewed HEP Person educated: Patient and Spouse Education method: Medical illustrator Education comprehension: verbalized understanding and returned demonstration  HOME EXERCISE PROGRAM: Access Code: [email or web address] URL: [email or web address] Date: 11/06/2023 Prepared by: Hazeline Lister  Exercises - Seated Scapular Retraction  - 1 x daily - 7 x weekly - 2 sets - 10 reps - Standing Isometric Shoulder External Rotation with Doorway  - 1 x daily - 7 x weekly - 2 sets - 10 reps - 5-10sec hold - Isometric Shoulder Abduction at Wall  - 1 x daily - 7 x weekly - 2 sets - 10 reps -  5-10 sec hold - Isometric Shoulder Flexion at Wall  - 1 x daily - 7 x weekly - 2 sets - 10 reps - 5-10 sec hold   Access Code: [email or web address] URL: [email or web address] Date: 11/18/2023 Prepared by: Hazeline Lister  Exercises - Seated Scapular Retraction  - 1 x daily - 7 x weekly - 2 sets - 10 reps - Supine Shoulder External Rotation AAROM with Dowel  - 1 x daily - 7 x weekly - 3 sets - 10 reps - Supine Shoulder Flexion Overhead with Dowel  - 1 x daily - 7 x weekly - 3 sets - 10 reps - Supine Shoulder Press with Dowel  - 1 x daily - 7 x weekly - 3 sets - 10 reps - Supine Shoulder Abduction AAROM with Dowel  - 1 x daily - 7 x weekly - 3 sets - 10 reps - Supine Shoulder Flexion AAROM with Dowel  - 1 x daily - 7 x weekly - 3 sets - 10 reps  Access Code: [email or web address] URL: [email or web address] Date: 12/02/2023 Prepared by: Hazeline Lister  Exercises - Supine Shoulder External Rotation AAROM with Dowel  - 1 x daily - 5 x weekly - 3 sets - 10 reps - Supine Shoulder Flexion Overhead with Dowel  - 1 x daily - 5 x weekly - 3 sets - 10 reps - Supine Shoulder Press with Dowel  - 1 x daily - 5 x weekly - 3 sets - 10 reps - Supine Shoulder Abduction AAROM with Dowel  - 1 x daily - 5 x weekly - 3 sets - 10 reps -  Supine Shoulder Flexion AAROM with Dowel  - 1 x daily - 5 x weekly - 3 sets - 10 reps - Shoulder extension with resistance - Neutral  - 1 x daily - 5 x weekly - 3 sets - 10 reps - Standing Tricep Extensions with Resistance  - 1 x daily - 5 x weekly - 3 sets - 10 reps - Standing Single Arm Bicep Curls Supinated with Dumbbell  - 1 x daily - 5 x weekly - 3 sets - 10 reps  ASSESSMENT:  CLINICAL IMPRESSION:  Tx. Focused on L shoulder A/AROM in pain tolerable range and progression of L shoulder functional reaching/ strengthening. Pt. Challenged with eccentric L shoulder control after sh. Flexion static holds.  Pt. Remains limited by joint stiffness/ pain and generalized tenderness in B shoulder musculature.  Pt. Remains motivated to increase L shoulder motor control.  Issued RTB for HEP.  Pt. Will benefit from skilled PT services to increase L shoulder ROM/ strength to promote return to PLOF/ mobility with SPC.       OBJECTIVE IMPAIRMENTS: decreased activity tolerance, decreased endurance, decreased ROM, decreased strength, hypomobility, impaired flexibility, impaired sensation, improper body mechanics, postural dysfunction, and pain.   ACTIVITY LIMITATIONS: carrying, lifting, bathing, toileting, dressing, self feeding, reach over head, and hygiene/grooming  PARTICIPATION LIMITATIONS: meal prep, cleaning, laundry, and shopping  PERSONAL FACTORS: Fitness and Past/current experiences are also affecting patient's functional outcome.   REHAB POTENTIAL: Good  CLINICAL DECISION MAKING: Stable/uncomplicated  EVALUATION COMPLEXITY: Moderate   GOALS: Goals reviewed with patient? Yes  LONG TERM GOALS: Target date: 04/26/24  Pt. Able to complete HEP with no increase c/o L shoulder pain to improve L sh. Joint mobility/ stability.   Baseline: 5/12: minimal c/o L shoulder pain/ soreness Goal status: Partially met  2.  Pt. Will increase L shoulder AROM to >100 deg. Flexion to improve management of hair/  feeding.   Baseline: no L shoulder AROM at this time.   2/24:  pt. Challenged managing hair with compensatory movement patterns.  4/15: see above chart.  5/12: standing AAROM with isometric hold at 115 deg.  AROM <90 deg. Goal status: Not met  3.  Pt. Able to ambulate with use of SPC on L with consistent recip. Gait pattern and on c/o L shoulder pain to improve household mobility.   Baseline: Pt. Unable to use L shoulder/ SPC at this time.  5/12: pt. Ambulate with mod. I and use of SPC on L.  No recent falls.  Goal status: Goal met  4.  Pt. Will be able to dress/ groom independently with no shoulder pain/ limitations.    Baseline: limited with use of L shoulder during dressing.  5/12: limited with L shoulder IR behind back and bring L hand to head.    Goal status: Partially met  PLAN:  PT FREQUENCY: 1-2x/week  PT DURATION: 8 weeks  PLANNED INTERVENTIONS: 97110-Therapeutic exercises, 97530- Therapeutic activity, [id number]- Neuromuscular re-education, 97535- Self Care, 16109- Manual therapy, Patient/Family education, Joint mobilization, DME instructions, Cryotherapy, and Moist heat  PLAN FOR NEXT SESSION:  Progress nautilus exercises to both seated and standing positions.    Lendell Quarry, PT, DPT # [phone] Physical Therapist - [facility] 03/08/2024, 1:39 PM

## 2024-03-17 ENCOUNTER — Ambulatory Visit: Admitting: Physical Therapy

## 2024-03-17 DIAGNOSIS — M25612 Stiffness of left shoulder, not elsewhere classified: Secondary | ICD-10-CM | POA: Diagnosis not present

## 2024-03-17 DIAGNOSIS — M25512 Pain in left shoulder: Secondary | ICD-10-CM

## 2024-03-17 DIAGNOSIS — M6281 Muscle weakness (generalized): Secondary | ICD-10-CM

## 2024-03-17 NOTE — Therapy (Signed)
 OUTPATIENT PHYSICAL THERAPY SHOULDER TREATMENT  Patient Name: Evelyn Cisneros MRN: [id number] DOB:03/13/1941, 83 y.o., female Today's Date: 03/17/2024  END OF SESSION:  PT End of Session - 03/17/24 1923     Visit Number 23    Number of Visits 36    Date for PT Re-Evaluation 04/26/24    PT Start Time 1110    PT Stop Time 1155    PT Time Calculation (min) 45 min    Activity Tolerance Patient tolerated treatment well    Behavior During Therapy WFL for tasks assessed/performed             Past Medical History:  Diagnosis Date   Anemia    Arthritis    Heart murmur    Hypertension    Hypothyroidism    Spondylolisthesis of lumbar region    Past Surgical History:  Procedure Laterality Date   ABDOMINAL HYSTERECTOMY     REPLACEMENT TOTAL KNEE BILATERAL     SPINE SURGERY     Patient Active Problem List   Diagnosis Date Noted   Spondylolisthesis of lumbar region 05/04/2018   Abnormal glucose level 02/12/2018   Abnormal weight gain 02/12/2018   Allergic rhinitis 02/12/2018   Asthma 02/12/2018   Bradycardia 02/12/2018   Edema 02/12/2018   Heart murmur 02/12/2018   Hypercalcemia 02/12/2018   Hypokalemia 02/12/2018   Pure hypercholesterolemia 02/12/2018   Shoulder joint pain 02/12/2018   Skin sensation disturbance 02/12/2018   Syncope 02/12/2018   Vitamin D  deficiency 02/12/2018   Central cervical cord injury, 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, 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) 04/24/2017   Muscle spasticity 04/24/2017   Weight loss 12/19/2016   Hypothyroidism 11/24/2016   Bilateral leg edema 11/24/2016   Cervical radiculopathy 11/24/2016   Disc degeneration, lumbar 11/24/2016   Essential hypertension 11/24/2016   Gallstones 11/24/2016   History of anxiety state 11/24/2016   Hyperlipidemia, unspecified 11/24/2016   Spinal stenosis of lumbar region with neurogenic claudication 11/08/2016   Anxiety 06/08/2015   PCP: Levell Reach, MD  REFERRING PROVIDER: Neomia Banner, PA-C  REFERRING  DIAG: [phone] (ICD-10-CM) - Anterior dislocation of left humerus   THERAPY DIAG:  Post-traumatic stiffness of left shoulder joint  Acute pain of left shoulder  Muscle weakness (generalized)  Rationale for Evaluation and Treatment: Rehabilitation  ONSET DATE: 10/20/23  SUBJECTIVE:                                                                                                                                                                                      SUBJECTIVE STATEMENT: Pt. Reports falling while pushing shopping cart in parking lot at [place].  Pt. Hit head resulting in lacerations/ stitches  and L anterior shoulder dislocation.  Pt. Reports 0/10 L shoulder pain at rest and 2/10 pain with eating/brushing teeth.  Pt. Was using SPC on L prior to fall but limited due to shoulder sling.  Pt. Entered PT in w/c today due to inability to use L UE at this time.  Hand dominance: Left  PERTINENT HISTORY: Subjective   Evelyn Cisneros is a 83 y.o. female in today for: HPI: History of Present Illness  Patient here today for suture removal. She underwent lac repair of her forehead after a fall on concrete 12/30. Repaired 2 lacs with 2 interrupted sutures and 6 running locked sutures.   Today, patient reports healing well. She has had a nodule come up on her left wrist. It is mildly tender to palpation, does not bother her otherwise.   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 90 tablet 3  atorvastatin  (LIPITOR) 40 MG tablet TAKE 1 TABLET(40 MG) BY MOUTH EVERY DAY 90 tablet 3  biotin 5 mg Tab Take by mouth  busPIRone (BUSPAR) 5 MG tablet Take 1 tablet (5 mg total) by mouth 2 (two) times daily as needed 30 tablet 5  calcium  lactate 100 mg calcium  Tab Take by mouth  cholecalciferol , vitamin D3, (VITAMIN D3) 125 mcg (5,000 unit) Tab Take 1 tablet by mouth every morning 90 tablet 3  co-enzyme Q-10, ubiquinone, 200 mg capsule Take 200 mg by mouth every morning.   cyanocobalamin (VITAMIN B12) 1,000 mcg SL tablet Take by mouth  ferrous sulfate  325 (65 FE) MG tablet Take 1 tablet (325 mg total) by mouth twice a week 90 tablet 3  FUROsemide  (LASIX ) 20 MG tablet Take 1-2 tablets (20-40 mg total) by mouth once daily as needed for Edema 60 tablet 4  gabapentin  (NEURONTIN ) 300 MG capsule TAKE ONE CAPSULE BY MOUTH THREE TIMES DAILY TO FOUR TIMES DAILY AS NEEDED FOR PAIN 360 capsule 3  Lactobacillus acidophilus (PROBIOTIC ORAL) Take by mouth once daily  levothyroxine  (SYNTHROID ) 50 MCG tablet Take 1.5 tablets (75 mcg total) by mouth once daily 135 tablet 3  lidocaine  (LIDODERM ) 5 % patch as needed  losartan  (COZAAR ) 100 MG tablet TAKE 1 TABLET(100 MG) BY MOUTH EVERY DAY 90 tablet 3  MAGNESIUM ORAL Take by mouth  VIT C/VIT E AC/LUT/COPPER/ZINC  (PRESERVISION LUTEIN ORAL) Take 1 tablet by mouth every morning. Last dose one week prior to procedure  ZINC  ORAL Take 50 mg by mouth once daily   No facility-administered medications prior to visit.   Objective   Vitals:  10/30/23 1350  PainSc: 0-No pain   There is no height or weight on file to calculate BMI. Home Vitals:  Physical Exam Constitutional:  General: She is not in acute distress. Appearance: Normal appearance.  HENT:  Head: Normocephalic and atraumatic.  Eyes:  General: No  scleral icterus. Extraocular Movements: Extraocular movements intact.  Conjunctiva/sclera: Conjunctivae normal.  Cardiovascular:  Rate and Rhythm: Normal rate and regular rhythm.  Pulmonary:  Effort: Pulmonary effort is normal. No respiratory distress.  Breath sounds: Normal breath sounds.  Musculoskeletal:  General: Normal range of motion.  Cervical back: Normal range of motion and neck supple.  Comments: Head lacerations with good interval healing. All sutures visualized for removal.   Left hand with significant bruising from PIPs to upper wrist, interval resolution present. There is a cystic lesion, nonmobile, mildly tender to manipulation present in her posterior central wrist.  Skin: General: Skin is warm and dry.  Coloration: Skin is not jaundiced.  Findings: No erythema or rash.  Neurological:  General: No focal deficit present.  Mental Status: She is alert.  Cranial Nerves: No cranial nerve deficit.  Coordination: Coordination normal.   Assessment & Plan  Diagnoses and all orders for this visit:  Visit for suture removal - Running central scalp suture and 2x interrupted suture of right scalp removed without difficulty. Area clean with good interval healing to wound.  - Recommend gentle washing with soap and water. Avoid aggressive scrubbing. Keep area clean and dry. Reviewed warning s/s for wound healing or new onset infection requiring further evaluation.  Wrist lesion - Possible ganglion cyst vs healing blister pocket, difficult to evaluate fully with overall bruising and injury from her recent fall. Reviewed possible diagnoses. If pain, worsening swelling, numbness/tingling or movement limitations recommend earlier evaluation. Otherwise recommend evaluation by orthopedics at her follow up if symptoms do not resolve with the rest of her injuries.   PAIN:  Are you having pain? Yes: NPRS scale: 2/10 Pain location: L shoulder Pain description: sharp/aching Aggravating  factors: movement/ brushing teeth Relieving factors: rest/ use of sling  PRECAUTIONS: Shoulder  RED FLAGS: None   WEIGHT BEARING RESTRICTIONS: No  FALLS:  Has patient fallen in last 6 months? Yes. Number of falls 2  LIVING ENVIRONMENT: Lives with: lives with their spouse Lives in: House/apartment Has following equipment at home: Single point cane and Wheelchair (manual)  OCCUPATION: Retired  PLOF: Independent with household mobility with device  PATIENT GOALS:  Increase L shoulder AROM/ strength/ pain-free mobility.    NEXT MD VISIT: 11/14/23 with Dr. Lorenz Romano (ortho MD)  OBJECTIVE:  Note: Objective measures were completed at Evaluation unless otherwise noted.  DIAGNOSTIC FINDINGS:  See imaging  PATIENT SURVEYS:  FOTO initial 32/ goal 65   COGNITION: Overall cognitive status: Within functional limits for tasks assessed     SENSATION: WFL.  Moderate L hand bruising.    POSTURE: Rounded shoulders/ forward posture.  Use of L shoulder sling.    UPPER EXTREMITY ROM:  Full tear of R supraspinatus reported.    Passive ROM Right Eval AROM Left Eval PROM  Shoulder flexion 74 deg. 53 deg.  Shoulder extension    Shoulder abduction 70 deg. 42 deg.  Shoulder adduction    Shoulder internal rotation  [facility]  Shoulder external rotation  0 deg.  Elbow flexion [facility] WFL  Elbow extension [facility] WFL  Wrist flexion    Wrist extension    Wrist ulnar deviation    Wrist radial deviation    Wrist pronation    Wrist supination    (Blank rows = not tested)  UPPER EXTREMITY MMT: No MMT testing today.    SHOULDER SPECIAL TESTS: NT  JOINT MOBILITY TESTING:  No L shoulder joint mobs. Secondary to anterior dislocation.    PALPATION:  Generalized L hand tenderness/ bruising.    4/15:  Passive ROM Right  seated AROM Left seated PROM  Shoulder flexion 82 deg. 95 deg.  Shoulder extension    Shoulder abduction 74 deg. 88 deg.  Shoulder adduction    Shoulder internal rotation  [facility]  Shoulder external rotation 30 deg. 0 deg.  Elbow flexion [facility] WFL  Elbow extension [facility]  Wrist flexion    Wrist extension    Wrist ulnar deviation    Wrist radial deviation    Wrist pronation    Wrist supination    (Blank rows = not tested)                                                                                                             TREATMENT DATE: 03/17/2024  Subjective:  Pt. Entered PT with no new complaints.  Pt. Has been active running errands with husband to prepare for upcoming 60th anniversary/ family visiting.    There. Act.    Seated L shoulder AAROM (with SPC):  chest press/ sh. Flexion (PT assist)/ bicep curls (supinated)/ L shoulder ER.    Seated L shoulder manual isometrics (IR/ER)  Nautilus: AAROM/min. Assist with all movements:  PT assists with R hand placement/ grasp on wand.   Seated lat. pull down 30#  20x  Standing tricep extension 20# 20x Standing bicep curls 10# 20x. Standing scap. Retraction 30# 20x.    Standing functional reaching with hospital table (1# dumbbell)/ raising in over yoga block with L/R UE.  Challenged with L UE  Manual tx.:  Seated STM to L and R shoulder and UT musculature during sh. ROM reassessment.  Moderate L shoulder (posterior aspect)/ bicep tenderness with STM.   Seated L shoulder PROM (flexion/ abduction/ ER)- 5x each.  Encouraged active holds at tolerable passive end-range.     NOT TODAY  Seated shoulder pulley ex.: flexion/ abduction (cuing for L shoulder isometric holds in flexion/ abduction).    Standing functional reaching at rolling mirror with use of Blaze Pods (random program) with focus on L shoulder ROM below eye level.  Moderate fatigue and benefits from light PT assist.  Increase L UT compensatory patterns noted with increase fatigue.    Standing at shelves (64 in. 2nd shelf)- L overhead reaching with cones.  Light PT assist at elbow and cuing manage shoulder/UE.    Standing shoulder  shrugs/ scapular retraction.  Discussed HEP  PATIENT EDUCATION: Education details: Reviewed HEP Person educated: Patient and Spouse Education method: Medical illustrator Education comprehension: verbalized understanding and returned demonstration  HOME EXERCISE PROGRAM: Access Code: [email or web address] URL: [email or web address] Date: 11/06/2023 Prepared by: Hazeline Lister  Exercises - Seated Scapular Retraction  - 1 x daily - 7 x weekly - 2 sets - 10 reps - Standing Isometric Shoulder External Rotation with Doorway  - 1 x daily - 7 x weekly - 2 sets - 10 reps - 5-10sec hold - Isometric Shoulder Abduction at Wall  - 1 x daily - 7 x weekly - 2 sets - 10 reps - 5-10 sec hold - Isometric Shoulder Flexion at Wall  -  1 x daily - 7 x weekly - 2 sets - 10 reps - 5-10 sec hold   Access Code: [email or web address] URL: [email or web address] Date: 11/18/2023 Prepared by: Hazeline Lister  Exercises - Seated Scapular Retraction  - 1 x daily - 7 x weekly - 2 sets - 10 reps - Supine Shoulder External Rotation AAROM with Dowel  - 1 x daily - 7 x weekly - 3 sets - 10 reps - Supine Shoulder Flexion Overhead with Dowel  - 1 x daily - 7 x weekly - 3 sets - 10 reps - Supine Shoulder Press with Dowel  - 1 x daily - 7 x weekly - 3 sets - 10 reps - Supine Shoulder Abduction AAROM with Dowel  - 1 x daily - 7 x weekly - 3 sets - 10 reps - Supine Shoulder Flexion AAROM with Dowel  - 1 x daily - 7 x weekly - 3 sets - 10 reps  Access Code: [email or web address] URL: [email or web address] Date: 12/02/2023 Prepared by: Hazeline Lister  Exercises - Supine Shoulder External Rotation AAROM with Dowel  - 1 x daily - 5 x weekly - 3 sets - 10 reps - Supine Shoulder Flexion Overhead with Dowel  - 1 x daily - 5 x weekly - 3 sets - 10 reps - Supine Shoulder Press with Dowel  - 1 x daily - 5 x weekly - 3 sets - 10 reps - Supine Shoulder Abduction AAROM with Dowel  - 1 x daily - 5 x weekly - 3 sets - 10  reps - Supine Shoulder Flexion AAROM with Dowel  - 1 x daily - 5 x weekly - 3 sets - 10 reps - Shoulder extension with resistance - Neutral  - 1 x daily - 5 x weekly - 3 sets - 10 reps - Standing Tricep Extensions with Resistance  - 1 x daily - 5 x weekly - 3 sets - 10 reps - Standing Single Arm Bicep Curls Supinated with Dumbbell  - 1 x daily - 5 x weekly - 3 sets - 10 reps  ASSESSMENT:  CLINICAL IMPRESSION:  Tx. Focused on L shoulder A/AROM in pain tolerable range and progression of L shoulder functional reaching/ strengthening. Pt. Challenged with eccentric L shoulder control after sh. Flexion static holds/ IR and ER isometrics.  Pt. Remains limited by joint stiffness/ pain and generalized tenderness in B shoulder musculature.  Pt. Remains motivated to increase L shoulder motor control.  No change to HEP.  Pt. Will benefit from skilled PT services to increase L shoulder ROM/ strength to promote return to PLOF/ mobility with SPC.       OBJECTIVE IMPAIRMENTS: decreased activity tolerance, decreased endurance, decreased ROM, decreased strength, hypomobility, impaired flexibility, impaired sensation, improper body mechanics, postural dysfunction, and pain.   ACTIVITY LIMITATIONS: carrying, lifting, bathing, toileting, dressing, self feeding, reach over head, and hygiene/grooming  PARTICIPATION LIMITATIONS: meal prep, cleaning, laundry, and shopping  PERSONAL FACTORS: Fitness and Past/current experiences are also affecting patient's functional outcome.   REHAB POTENTIAL: Good  CLINICAL DECISION MAKING: Stable/uncomplicated  EVALUATION COMPLEXITY: Moderate   GOALS: Goals reviewed with patient? Yes  LONG TERM GOALS: Target date: 04/26/24  Pt. Able to complete HEP with no increase c/o L shoulder pain to improve L sh. Joint mobility/ stability.   Baseline: 5/12: minimal c/o L shoulder pain/ soreness Goal status: Partially met  2.  Pt. Will increase L shoulder AROM to >100 deg. Flexion to  improve management of hair/ feeding.   Baseline: no L  shoulder AROM at this time.   2/24:  pt. Challenged managing hair with compensatory movement patterns.  4/15: see above chart.  5/12: standing AAROM with isometric hold at 115 deg.  AROM <90 deg. Goal status: Not met  3.  Pt. Able to ambulate with use of SPC on L with consistent recip. Gait pattern and on c/o L shoulder pain to improve household mobility.   Baseline: Pt. Unable to use L shoulder/ SPC at this time.  5/12: pt. Ambulate with mod. I and use of SPC on L.  No recent falls.  Goal status: Goal met  4.  Pt. Will be able to dress/ groom independently with no shoulder pain/ limitations.    Baseline: limited with use of L shoulder during dressing.  5/12: limited with L shoulder IR behind back and bring L hand to head.    Goal status: Partially met  PLAN:  PT FREQUENCY: 1-2x/week  PT DURATION: 8 weeks  PLANNED INTERVENTIONS: 97110-Therapeutic exercises, 97530- Therapeutic activity, [id number]- Neuromuscular re-education, 97535- Self Care, [id number]- Manual therapy, Patient/Family education, Joint mobilization, DME instructions, Cryotherapy, and Moist heat  PLAN FOR NEXT SESSION:  Progress nautilus exercises to both seated and standing positions.    Lendell Quarry, PT, DPT # [phone] Physical Therapist - [facility] 03/17/2024, 7:24 PM

## 2024-03-22 ENCOUNTER — Encounter: Payer: Self-pay | Admitting: Physical Therapy

## 2024-03-22 ENCOUNTER — Ambulatory Visit: Attending: Orthopedic Surgery | Admitting: Physical Therapy

## 2024-03-22 DIAGNOSIS — M6281 Muscle weakness (generalized): Secondary | ICD-10-CM | POA: Insufficient documentation

## 2024-03-22 DIAGNOSIS — M25612 Stiffness of left shoulder, not elsewhere classified: Secondary | ICD-10-CM | POA: Diagnosis present

## 2024-03-22 DIAGNOSIS — R269 Unspecified abnormalities of gait and mobility: Secondary | ICD-10-CM | POA: Insufficient documentation

## 2024-03-22 DIAGNOSIS — M25512 Pain in left shoulder: Secondary | ICD-10-CM | POA: Diagnosis present

## 2024-03-22 DIAGNOSIS — R2689 Other abnormalities of gait and mobility: Secondary | ICD-10-CM | POA: Insufficient documentation

## 2024-03-22 NOTE — Therapy (Signed)
 OUTPATIENT PHYSICAL THERAPY SHOULDER TREATMENT  Patient Name: Evelyn Cisneros MRN: [id number] DOB:1941/08/14, 83 y.o., female Today's Date: 03/22/2024  END OF SESSION:  PT End of Session - 03/22/24 1030     Visit Number 24    Number of Visits 36    Date for PT Re-Evaluation 04/26/24    PT Start Time 1026    PT Stop Time 1116    PT Time Calculation (min) 50 min    Activity Tolerance Patient tolerated treatment well    Behavior During Therapy WFL for tasks assessed/performed             Past Medical History:  Diagnosis Date   Anemia    Arthritis    Heart murmur    Hypertension    Hypothyroidism    Spondylolisthesis of lumbar region    Past Surgical History:  Procedure Laterality Date   ABDOMINAL HYSTERECTOMY     REPLACEMENT TOTAL KNEE BILATERAL     SPINE SURGERY     Patient Active Problem List   Diagnosis Date Noted   Spondylolisthesis of lumbar region 05/04/2018   Abnormal glucose level 02/12/2018   Abnormal weight gain 02/12/2018   Allergic rhinitis 02/12/2018   Asthma 02/12/2018   Bradycardia 02/12/2018   Edema 02/12/2018   Heart murmur 02/12/2018   Hypercalcemia 02/12/2018   Hypokalemia 02/12/2018   Pure hypercholesterolemia 02/12/2018   Shoulder joint pain 02/12/2018   Skin sensation disturbance 02/12/2018   Syncope 02/12/2018   Vitamin D  deficiency 02/12/2018   Central cervical cord injury, 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, 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) 04/24/2017   Muscle spasticity 04/24/2017   Weight loss 12/19/2016   Hypothyroidism 11/24/2016   Bilateral leg edema 11/24/2016   Cervical radiculopathy 11/24/2016   Disc degeneration, lumbar 11/24/2016   Essential hypertension 11/24/2016   Gallstones 11/24/2016   History of anxiety state 11/24/2016   Hyperlipidemia, unspecified 11/24/2016   Spinal stenosis of lumbar region with neurogenic claudication 11/08/2016   Anxiety 06/08/2015   PCP: Levell Reach, MD  REFERRING PROVIDER: Neomia Banner, PA-C  REFERRING  DIAG: [phone] (ICD-10-CM) - Anterior dislocation of left humerus   THERAPY DIAG:  Post-traumatic stiffness of left shoulder joint  Acute pain of left shoulder  Muscle weakness (generalized)  Rationale for Evaluation and Treatment: Rehabilitation  ONSET DATE: 10/20/23  SUBJECTIVE:                                                                                                                                                                                      SUBJECTIVE STATEMENT: Pt. Reports falling while pushing shopping cart in parking lot at [employer].  Pt. Hit head resulting in lacerations/ stitches  and L anterior shoulder dislocation.  Pt. Reports 0/10 L shoulder pain at rest and 2/10 pain with eating/brushing teeth.  Pt. Was using SPC on L prior to fall but limited due to shoulder sling.  Pt. Entered PT in w/c today due to inability to use L UE at this time.  Hand dominance: Left  PERTINENT HISTORY: Subjective   Tawania Daponte is a 83 y.o. female in today for: HPI: History of Present Illness  Patient here today for suture removal. She underwent lac repair of her forehead after a fall on concrete 12/30. Repaired 2 lacs with 2 interrupted sutures and 6 running locked sutures.   Today, patient reports healing well. She has had a nodule come up on her left wrist. It is mildly tender to palpation, does not bother her otherwise.   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 90 tablet 3  atorvastatin  (LIPITOR) 40 MG tablet TAKE 1 TABLET(40 MG) BY MOUTH EVERY DAY 90 tablet 3  biotin 5 mg Tab Take by mouth  busPIRone (BUSPAR) 5 MG tablet Take 1 tablet (5 mg total) by mouth 2 (two) times daily as needed 30 tablet 5  calcium  lactate 100 mg calcium  Tab Take by mouth  cholecalciferol , vitamin D3, (VITAMIN D3) 125 mcg (5,000 unit) Tab Take 1 tablet by mouth every morning 90 tablet 3  co-enzyme Q-10, ubiquinone, 200 mg capsule Take 200 mg by mouth every morning.   cyanocobalamin (VITAMIN B12) 1,000 mcg SL tablet Take by mouth  ferrous sulfate  325 (65 FE) MG tablet Take 1 tablet (325 mg total) by mouth twice a week 90 tablet 3  FUROsemide  (LASIX ) 20 MG tablet Take 1-2 tablets (20-40 mg total) by mouth once daily as needed for Edema 60 tablet 4  gabapentin  (NEURONTIN ) 300 MG capsule TAKE ONE CAPSULE BY MOUTH THREE TIMES DAILY TO FOUR TIMES DAILY AS NEEDED FOR PAIN 360 capsule 3  Lactobacillus acidophilus (PROBIOTIC ORAL) Take by mouth once daily  levothyroxine  (SYNTHROID ) 50 MCG tablet Take 1.5 tablets (75 mcg total) by mouth once daily 135 tablet 3  lidocaine  (LIDODERM ) 5 % patch as needed  losartan  (COZAAR ) 100 MG tablet TAKE 1 TABLET(100 MG) BY MOUTH EVERY DAY 90 tablet 3  MAGNESIUM ORAL Take by mouth  VIT C/VIT E AC/LUT/COPPER/ZINC  (PRESERVISION LUTEIN ORAL) Take 1 tablet by mouth every morning. Last dose one week prior to procedure  ZINC  ORAL Take 50 mg by mouth once daily   No facility-administered medications prior to visit.   Objective   Vitals:  10/30/23 1350  PainSc: 0-No pain   There is no height or weight on file to calculate BMI. Home Vitals:  Physical Exam Constitutional:  General: She is not in acute distress. Appearance: Normal appearance.  HENT:  Head: Normocephalic and atraumatic.  Eyes:  General: No  scleral icterus. Extraocular Movements: Extraocular movements intact.  Conjunctiva/sclera: Conjunctivae normal.  Cardiovascular:  Rate and Rhythm: Normal rate and regular rhythm.  Pulmonary:  Effort: Pulmonary effort is normal. No respiratory distress.  Breath sounds: Normal breath sounds.  Musculoskeletal:  General: Normal range of motion.  Cervical back: Normal range of motion and neck supple.  Comments: Head lacerations with good interval healing. All sutures visualized for removal.   Left hand with significant bruising from PIPs to upper wrist, interval resolution present. There is a cystic lesion, nonmobile, mildly tender to manipulation present in her posterior central wrist.  Skin: General: Skin is warm and dry.  Coloration: Skin is not jaundiced.  Findings: No erythema or rash.  Neurological:  General: No focal deficit present.  Mental Status: She is alert.  Cranial Nerves: No cranial nerve deficit.  Coordination: Coordination normal.   Assessment & Plan  Diagnoses and all orders for this visit:  Visit for suture removal - Running central scalp suture and 2x interrupted suture of right scalp removed without difficulty. Area clean with good interval healing to wound.  - Recommend gentle washing with soap and water. Avoid aggressive scrubbing. Keep area clean and dry. Reviewed warning s/s for wound healing or new onset infection requiring further evaluation.  Wrist lesion - Possible ganglion cyst vs healing blister pocket, difficult to evaluate fully with overall bruising and injury from her recent fall. Reviewed possible diagnoses. If pain, worsening swelling, numbness/tingling or movement limitations recommend earlier evaluation. Otherwise recommend evaluation by orthopedics at her follow up if symptoms do not resolve with the rest of her injuries.   PAIN:  Are you having pain? Yes: NPRS scale: 2/10 Pain location: L shoulder Pain description: sharp/aching Aggravating  factors: movement/ brushing teeth Relieving factors: rest/ use of sling  PRECAUTIONS: Shoulder  RED FLAGS: None   WEIGHT BEARING RESTRICTIONS: No  FALLS:  Has patient fallen in last 6 months? Yes. Number of falls 2  LIVING ENVIRONMENT: Lives with: lives with their spouse Lives in: House/apartment Has following equipment at home: Single point cane and Wheelchair (manual)  OCCUPATION: Retired  PLOF: Independent with household mobility with device  PATIENT GOALS:  Increase L shoulder AROM/ strength/ pain-free mobility.    NEXT MD VISIT: 11/14/23 with Dr. Lorenz Romano (ortho MD)  OBJECTIVE:  Note: Objective measures were completed at Evaluation unless otherwise noted.  DIAGNOSTIC FINDINGS:  See imaging  PATIENT SURVEYS:  FOTO initial 32/ goal 12   COGNITION: Overall cognitive status: Within functional limits for tasks assessed     SENSATION: WFL.  Moderate L hand bruising.    POSTURE: Rounded shoulders/ forward posture.  Use of L shoulder sling.    UPPER EXTREMITY ROM:  Full tear of R supraspinatus reported.    Passive ROM Right Eval AROM Left Eval PROM  Shoulder flexion 74 deg. 53 deg.  Shoulder extension    Shoulder abduction 70 deg. 42 deg.  Shoulder adduction    Shoulder internal rotation  [facility]  Shoulder external rotation  0 deg.  Elbow flexion [facility] WFL  Elbow extension [facility] WFL  Wrist flexion    Wrist extension    Wrist ulnar deviation    Wrist radial deviation    Wrist pronation    Wrist supination    (Blank rows = not tested)  UPPER EXTREMITY MMT: No MMT testing today.    SHOULDER SPECIAL TESTS: NT  JOINT MOBILITY TESTING:  No L shoulder joint mobs. Secondary to anterior dislocation.    PALPATION:  Generalized L hand tenderness/ bruising.    4/15:  Passive ROM Right  seated AROM Left seated PROM  Shoulder flexion 82 deg. 95 deg.  Shoulder extension    Shoulder abduction 74 deg. 88 deg.  Shoulder adduction    Shoulder internal rotation  [facility]  Shoulder external rotation 30 deg. 0 deg.  Elbow flexion [facility] WFL  Elbow extension [facility]  Wrist flexion    Wrist extension    Wrist ulnar deviation    Wrist radial deviation    Wrist pronation    Wrist supination    (Blank rows = not tested)                                                                                                             TREATMENT DATE: 03/22/2024  Subjective:  Pt. Entered PT with no new complaints.  Pt. Has been active running errands with husband to prepare for upcoming 60th anniversary/ family visiting.  No falls or LOB.  Pt. Continues to report improvement with functional reaching in kitchen/ stove top.     There. Act.    Shoulder pulley: flexion/ scaption/ abduction 20x each (warm-up).    Seated L shoulder AAROM (with SPC):  chest press/ sh. Flexion (PT assist)/ bicep curls (supinated) 10x2 with mirror feedback.     Seated L shoulder manual isometrics (IR/ER) 5x each with min. To mod. Resistance.    B UBE 1 min. F/b (consistent cadence/ R hand grasp)  Standing wall ladder (increased by 2 rungs today)- blue tape  Nautilus: AAROM/min. Assist with all movements:  PT assists with R hand placement/ grasp on wand.   Seated lat. pull down 30#  20x  Standing tricep extension 20# 20x.  (Will increase resistance next tx.).   Standing bicep curls 10# 20x. Standing scap. Retraction 30# 20x.  (Challenged with balance)  Manual tx.:  Seated STM to L and R shoulder and UT musculature during sh. ROM reassessment.  Moderate L shoulder (posterior aspect)/ bicep tenderness with STM.   Seated L shoulder PROM (flexion/ abduction/ ER)- 5x each.  Encouraged active holds at tolerable passive end-range.     NOT TODAY  Standing functional reaching at rolling mirror with use of Blaze Pods (random program) with focus on L shoulder ROM below eye level.  Moderate fatigue and benefits from light PT assist.  Increase L UT compensatory patterns noted with increase  fatigue.    Standing at shelves (64 in. 2nd shelf)- L overhead reaching with cones.  Light PT assist at elbow and cuing manage shoulder/UE.    Standing functional reaching with hospital table (1# dumbbell)/ raising in over yoga block with L/R UE.  Challenged with L UE.  Standing shoulder shrugs/ scapular retraction.  Discussed HEP  PATIENT EDUCATION: Education details: Reviewed HEP Person educated: Patient and Spouse Education method: Medical illustrator Education comprehension: verbalized understanding and returned demonstration  HOME EXERCISE PROGRAM: Access Code: [email or web address] URL: [email or web address] Date: 11/06/2023 Prepared by: Hazeline Lister  Exercises - Seated Scapular Retraction  - 1 x daily - 7 x weekly - 2 sets - 10 reps - Standing Isometric  Shoulder External Rotation with Doorway  - 1 x daily - 7 x weekly - 2 sets - 10 reps - 5-10sec hold - Isometric Shoulder Abduction at Wall  - 1 x daily - 7 x weekly - 2 sets - 10 reps - 5-10 sec hold - Isometric Shoulder Flexion at Wall  - 1 x daily - 7 x weekly - 2 sets - 10 reps - 5-10 sec hold   Access Code: [email or web address] URL: [email or web address] Date: 11/18/2023 Prepared by: Hazeline Lister  Exercises - Seated Scapular Retraction  - 1 x daily - 7 x weekly - 2 sets - 10 reps - Supine Shoulder External Rotation AAROM with Dowel  - 1 x daily - 7 x weekly - 3 sets - 10 reps - Supine Shoulder Flexion Overhead with Dowel  - 1 x daily - 7 x weekly - 3 sets - 10 reps - Supine Shoulder Press with Dowel  - 1 x daily - 7 x weekly - 3 sets - 10 reps - Supine Shoulder Abduction AAROM with Dowel  - 1 x daily - 7 x weekly - 3 sets - 10 reps - Supine Shoulder Flexion AAROM with Dowel  - 1 x daily - 7 x weekly - 3 sets - 10 reps  Access Code: [email or web address] URL: [email or web address] Date: 12/02/2023 Prepared by: Hazeline Lister  Exercises - Supine Shoulder External Rotation AAROM with Dowel  - 1 x  daily - 5 x weekly - 3 sets - 10 reps - Supine Shoulder Flexion Overhead with Dowel  - 1 x daily - 5 x weekly - 3 sets - 10 reps - Supine Shoulder Press with Dowel  - 1 x daily - 5 x weekly - 3 sets - 10 reps - Supine Shoulder Abduction AAROM with Dowel  - 1 x daily - 5 x weekly - 3 sets - 10 reps - Supine Shoulder Flexion AAROM with Dowel  - 1 x daily - 5 x weekly - 3 sets - 10 reps - Shoulder extension with resistance - Neutral  - 1 x daily - 5 x weekly - 3 sets - 10 reps - Standing Tricep Extensions with Resistance  - 1 x daily - 5 x weekly - 3 sets - 10 reps - Standing Single Arm Bicep Curls Supinated with Dumbbell  - 1 x daily - 5 x weekly - 3 sets - 10 reps  ASSESSMENT:  CLINICAL IMPRESSION:  Tx. Focused on L shoulder A/AROM in pain tolerable range and progression of L shoulder functional reaching/ strengthening.  Pt. Challenged with eccentric L shoulder control after sh. Flexion static holds/ IR and ER isometrics.  SBA with standing Nautilus ex., esp. During resisted scapular retraction.   Pt. Remains limited by joint stiffness/ pain and generalized tenderness in B shoulder musculature.  Pt. Remains motivated to increase L shoulder motor control.  Increase in L shoulder flexion at wall ladder (marked with blue tape).  Pt. Will benefit from skilled PT services to increase L shoulder ROM/ strength to promote return to PLOF/ mobility with SPC.       OBJECTIVE IMPAIRMENTS: decreased activity tolerance, decreased endurance, decreased ROM, decreased strength, hypomobility, impaired flexibility, impaired sensation, improper body mechanics, postural dysfunction, and pain.   ACTIVITY LIMITATIONS: carrying, lifting, bathing, toileting, dressing, self feeding, reach over head, and hygiene/grooming  PARTICIPATION LIMITATIONS: meal prep, cleaning, laundry, and shopping  PERSONAL FACTORS: Fitness and Past/current experiences are also affecting patient's functional outcome.   REHAB POTENTIAL:  Good  CLINICAL DECISION MAKING: Stable/uncomplicated  EVALUATION COMPLEXITY: Moderate   GOALS: Goals reviewed with patient? Yes  LONG TERM GOALS: Target date: 04/26/24  Pt. Able to complete HEP with no increase c/o L shoulder pain to improve L sh. Joint mobility/ stability.   Baseline: 5/12: minimal c/o L shoulder pain/ soreness Goal status: Partially met  2.  Pt. Will increase L shoulder AROM to >100 deg. Flexion to improve management of hair/ feeding.   Baseline: no L shoulder AROM at this time.   2/24:  pt. Challenged managing hair with compensatory movement patterns.  4/15: see above chart.  5/12: standing AAROM with isometric hold at 115 deg.  AROM <90 deg. Goal status: Not met  3.  Pt. Able to ambulate with use of SPC on L with consistent recip. Gait pattern and on c/o L shoulder pain to improve household mobility.   Baseline: Pt. Unable to use L shoulder/ SPC at this time.  5/12: pt. Ambulate with mod. I and use of SPC on L.  No recent falls.  Goal status: Goal met  4.  Pt. Will be able to dress/ groom independently with no shoulder pain/ limitations.    Baseline: limited with use of L shoulder during dressing.  5/12: limited with L shoulder IR behind back and bring L hand to head.    Goal status: Partially met  PLAN:  PT FREQUENCY: 1-2x/week  PT DURATION: 8 weeks  PLANNED INTERVENTIONS: 97110-Therapeutic exercises, 97530- Therapeutic activity, [id number]- Neuromuscular re-education, 97535- Self Care, [id number]- Manual therapy, Patient/Family education, Joint mobilization, DME instructions, Cryotherapy, and Moist heat  PLAN FOR NEXT SESSION:  Progress nautilus exercises to both seated and standing positions.    Lendell Quarry, PT, DPT # [phone] Physical Therapist - [facility] 03/22/2024, 12:25 PM

## 2024-03-24 ENCOUNTER — Ambulatory Visit: Admitting: Physical Therapy

## 2024-03-24 ENCOUNTER — Encounter: Payer: Self-pay | Admitting: Physical Therapy

## 2024-03-24 DIAGNOSIS — M6281 Muscle weakness (generalized): Secondary | ICD-10-CM

## 2024-03-24 DIAGNOSIS — M25512 Pain in left shoulder: Secondary | ICD-10-CM

## 2024-03-24 DIAGNOSIS — M25612 Stiffness of left shoulder, not elsewhere classified: Secondary | ICD-10-CM

## 2024-03-24 NOTE — Therapy (Signed)
 OUTPATIENT PHYSICAL THERAPY SHOULDER TREATMENT  Patient Name: Evelyn Cisneros MRN: [id number] DOB:26-Jun-1941, 83 y.o., female Today's Date: 03/24/2024  END OF SESSION:  PT End of Session - 03/24/24 1124     Visit Number 25    Number of Visits 36    Date for PT Re-Evaluation 04/26/24    PT Start Time 1118    PT Stop Time 1200    PT Time Calculation (min) 42 min    Activity Tolerance Patient tolerated treatment well    Behavior During Therapy WFL for tasks assessed/performed             Past Medical History:  Diagnosis Date   Anemia    Arthritis    Heart murmur    Hypertension    Hypothyroidism    Spondylolisthesis of lumbar region    Past Surgical History:  Procedure Laterality Date   ABDOMINAL HYSTERECTOMY     REPLACEMENT TOTAL KNEE BILATERAL     SPINE SURGERY     Patient Active Problem List   Diagnosis Date Noted   Spondylolisthesis of lumbar region 05/04/2018   Abnormal glucose level 02/12/2018   Abnormal weight gain 02/12/2018   Allergic rhinitis 02/12/2018   Asthma 02/12/2018   Bradycardia 02/12/2018   Edema 02/12/2018   Heart murmur 02/12/2018   Hypercalcemia 02/12/2018   Hypokalemia 02/12/2018   Pure hypercholesterolemia 02/12/2018   Shoulder joint pain 02/12/2018   Skin sensation disturbance 02/12/2018   Syncope 02/12/2018   Vitamin D  deficiency 02/12/2018   Central cervical cord injury, 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, 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) 04/24/2017   Muscle spasticity 04/24/2017   Weight loss 12/19/2016   Hypothyroidism 11/24/2016   Bilateral leg edema 11/24/2016   Cervical radiculopathy 11/24/2016   Disc degeneration, lumbar 11/24/2016   Essential hypertension 11/24/2016   Gallstones 11/24/2016   History of anxiety state 11/24/2016   Hyperlipidemia, unspecified 11/24/2016   Spinal stenosis of lumbar region with neurogenic claudication 11/08/2016   Anxiety 06/08/2015   PCP: Levell Reach, MD  REFERRING PROVIDER: Neomia Banner, PA-C  REFERRING  DIAG: [phone] (ICD-10-CM) - Anterior dislocation of left humerus   THERAPY DIAG:  Post-traumatic stiffness of left shoulder joint  Acute pain of left shoulder  Muscle weakness (generalized)  Rationale for Evaluation and Treatment: Rehabilitation  ONSET DATE: 10/20/23  SUBJECTIVE:                                                                                                                                                                                      SUBJECTIVE STATEMENT: Pt. Reports falling while pushing shopping cart in parking lot at [place].  Pt. Hit head resulting in lacerations/ stitches  and L anterior shoulder dislocation.  Pt. Reports 0/10 L shoulder pain at rest and 2/10 pain with eating/brushing teeth.  Pt. Was using SPC on L prior to fall but limited due to shoulder sling.  Pt. Entered PT in w/c today due to inability to use L UE at this time.  Hand dominance: Left  PERTINENT HISTORY: Subjective   Evelyn Cisneros is a 83 y.o. female in today for: HPI: History of Present Illness  Patient here today for suture removal. She underwent lac repair of her forehead after a fall on concrete 12/30. Repaired 2 lacs with 2 interrupted sutures and 6 running locked sutures.   Today, patient reports healing well. She has had a nodule come up on her left wrist. It is mildly tender to palpation, does not bother her otherwise.   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 90 tablet 3  atorvastatin  (LIPITOR) 40 MG tablet TAKE 1 TABLET(40 MG) BY MOUTH EVERY DAY 90 tablet 3  biotin 5 mg Tab Take by mouth  busPIRone (BUSPAR) 5 MG tablet Take 1 tablet (5 mg total) by mouth 2 (two) times daily as needed 30 tablet 5  calcium  lactate 100 mg calcium  Tab Take by mouth  cholecalciferol , vitamin D3, (VITAMIN D3) 125 mcg (5,000 unit) Tab Take 1 tablet by mouth every morning 90 tablet 3  co-enzyme Q-10, ubiquinone, 200 mg capsule Take 200 mg by mouth every morning.   cyanocobalamin (VITAMIN B12) 1,000 mcg SL tablet Take by mouth  ferrous sulfate  325 (65 FE) MG tablet Take 1 tablet (325 mg total) by mouth twice a week 90 tablet 3  FUROsemide  (LASIX ) 20 MG tablet Take 1-2 tablets (20-40 mg total) by mouth once daily as needed for Edema 60 tablet 4  gabapentin  (NEURONTIN ) 300 MG capsule TAKE ONE CAPSULE BY MOUTH THREE TIMES DAILY TO FOUR TIMES DAILY AS NEEDED FOR PAIN 360 capsule 3  Lactobacillus acidophilus (PROBIOTIC ORAL) Take by mouth once daily  levothyroxine  (SYNTHROID ) 50 MCG tablet Take 1.5 tablets (75 mcg total) by mouth once daily 135 tablet 3  lidocaine  (LIDODERM ) 5 % patch as needed  losartan  (COZAAR ) 100 MG tablet TAKE 1 TABLET(100 MG) BY MOUTH EVERY DAY 90 tablet 3  MAGNESIUM ORAL Take by mouth  VIT C/VIT E AC/LUT/COPPER/ZINC  (PRESERVISION LUTEIN ORAL) Take 1 tablet by mouth every morning. Last dose one week prior to procedure  ZINC  ORAL Take 50 mg by mouth once daily   No facility-administered medications prior to visit.   Objective   Vitals:  10/30/23 1350  PainSc: 0-No pain   There is no height or weight on file to calculate BMI. Home Vitals:  Physical Exam Constitutional:  General: She is not in acute distress. Appearance: Normal appearance.  HENT:  Head: Normocephalic and atraumatic.  Eyes:  General: No  scleral icterus. Extraocular Movements: Extraocular movements intact.  Conjunctiva/sclera: Conjunctivae normal.  Cardiovascular:  Rate and Rhythm: Normal rate and regular rhythm.  Pulmonary:  Effort: Pulmonary effort is normal. No respiratory distress.  Breath sounds: Normal breath sounds.  Musculoskeletal:  General: Normal range of motion.  Cervical back: Normal range of motion and neck supple.  Comments: Head lacerations with good interval healing. All sutures visualized for removal.   Left hand with significant bruising from PIPs to upper wrist, interval resolution present. There is a cystic lesion, nonmobile, mildly tender to manipulation present in her posterior central wrist.  Skin: General: Skin is warm and dry.  Coloration: Skin is not jaundiced.  Findings: No erythema or rash.  Neurological:  General: No focal deficit present.  Mental Status: She is alert.  Cranial Nerves: No cranial nerve deficit.  Coordination: Coordination normal.   Assessment & Plan  Diagnoses and all orders for this visit:  Visit for suture removal - Running central scalp suture and 2x interrupted suture of right scalp removed without difficulty. Area clean with good interval healing to wound.  - Recommend gentle washing with soap and water. Avoid aggressive scrubbing. Keep area clean and dry. Reviewed warning s/s for wound healing or new onset infection requiring further evaluation.  Wrist lesion - Possible ganglion cyst vs healing blister pocket, difficult to evaluate fully with overall bruising and injury from her recent fall. Reviewed possible diagnoses. If pain, worsening swelling, numbness/tingling or movement limitations recommend earlier evaluation. Otherwise recommend evaluation by orthopedics at her follow up if symptoms do not resolve with the rest of her injuries.   PAIN:  Are you having pain? Yes: NPRS scale: 2/10 Pain location: L shoulder Pain description: sharp/aching Aggravating  factors: movement/ brushing teeth Relieving factors: rest/ use of sling  PRECAUTIONS: Shoulder  RED FLAGS: None   WEIGHT BEARING RESTRICTIONS: No  FALLS:  Has patient fallen in last 6 months? Yes. Number of falls 2  LIVING ENVIRONMENT: Lives with: lives with their spouse Lives in: House/apartment Has following equipment at home: Single point cane and Wheelchair (manual)  OCCUPATION: Retired  PLOF: Independent with household mobility with device  PATIENT GOALS:  Increase L shoulder AROM/ strength/ pain-free mobility.    NEXT MD VISIT: 11/14/23 with Dr. Lorenz Romano (ortho MD)  OBJECTIVE:  Note: Objective measures were completed at Evaluation unless otherwise noted.  DIAGNOSTIC FINDINGS:  See imaging  PATIENT SURVEYS:  FOTO initial 32/ goal 48   COGNITION: Overall cognitive status: Within functional limits for tasks assessed     SENSATION: WFL.  Moderate L hand bruising.    POSTURE: Rounded shoulders/ forward posture.  Use of L shoulder sling.    UPPER EXTREMITY ROM:  Full tear of R supraspinatus reported.    Passive ROM Right Eval AROM Left Eval PROM  Shoulder flexion 74 deg. 53 deg.  Shoulder extension    Shoulder abduction 70 deg. 42 deg.  Shoulder adduction    Shoulder internal rotation  [facility]  Shoulder external rotation  0 deg.  Elbow flexion [facility] WFL  Elbow extension [facility] WFL  Wrist flexion    Wrist extension    Wrist ulnar deviation    Wrist radial deviation    Wrist pronation    Wrist supination    (Blank rows = not tested)  UPPER EXTREMITY MMT: No MMT testing today.    SHOULDER SPECIAL TESTS: NT  JOINT MOBILITY TESTING:  No L shoulder joint mobs. Secondary to anterior dislocation.    PALPATION:  Generalized L hand tenderness/ bruising.    4/15:  Passive ROM Right  seated AROM Left seated PROM  Shoulder flexion 82 deg. 95 deg.  Shoulder extension    Shoulder abduction 74 deg. 88 deg.  Shoulder adduction    Shoulder internal rotation  [facility]  Shoulder external rotation 30 deg. 0 deg.  Elbow flexion [facility] WFL  Elbow extension [facility]  Wrist flexion    Wrist extension    Wrist ulnar deviation    Wrist radial deviation    Wrist pronation    Wrist supination    (Blank rows = not tested)                                                                                                             TREATMENT DATE: 03/24/2024  Subjective:  Pt. Entered PT with reports of a fall in bedroom onto bed with husband assist.  Pt. Needed assist going from prone to supine position on bed and to sit back up on edge of bed.  Pt. Reports 1/10 L shoulder discomfort prior to tx.    There. Act.    Shoulder pulley: flexion/ scaption/ abduction 20x each (warm-up).  Discussed near fall in bedroom while getting ready this morning.     B UBE 2 min. F/b (consistent cadence/ R hand grasp)  Standing wall ladder (sh. Flexion)- 5x to blue tape  Standing functional reaching with wash cloth (CW/CCW) at hospital table.  L shoulder flexion over yoga block with L/R UE (use of tennis ball/ 1# wt.).  Challenged with L UE and compensates with biceps/elbow flexion.    Nautilus: AAROM/min. Assist with all movements:  PT assists with R hand placement/ grasp on wand.   Seated lat. pull down 30#  20x  Standing tricep extension 30# 20x. Standing scap. Retraction 30# 20x.    Seated L shoulder manual isometrics (IR/ER) 5x each with min. To mod. Resistance.    NOT TODAY  Standing functional reaching at rolling mirror with use of Blaze Pods (random program) with focus on L shoulder ROM below eye level.  Moderate fatigue and benefits from light PT assist.  Increase L UT compensatory patterns noted with increase fatigue.    Seated L shoulder AAROM (with SPC):  chest press/ sh. Flexion (PT assist)/ bicep curls (supinated) 10x2 with mirror feedback.     Standing at shelves (64 in. 2nd shelf)- L overhead reaching with cones.  Light PT assist at elbow and cuing manage  shoulder/UE.   Nautilus: Standing bicep curls 10# 20x.   Standing shoulder shrugs/ scapular retraction.  Discussed HEP  Seated STM to L and R shoulder and UT musculature during sh. ROM reassessment.  Moderate L shoulder (posterior aspect)/ bicep tenderness with STM.   Seated L shoulder PROM (flexion/ abduction/ ER)- 5x each.  Encouraged active holds at tolerable passive end-range.   PATIENT EDUCATION: Education details: Reviewed HEP Person educated: Patient and Spouse Education method: Medical illustrator Education comprehension: verbalized understanding and returned demonstration  HOME EXERCISE PROGRAM: Access Code: [email or web address] URL: [email or web address] Date: 11/06/2023 Prepared by: Hazeline Lister  Exercises - Seated Scapular Retraction  -  1 x daily - 7 x weekly - 2 sets - 10 reps - Standing Isometric Shoulder External Rotation with Doorway  - 1 x daily - 7 x weekly - 2 sets - 10 reps - 5-10sec hold - Isometric Shoulder Abduction at Wall  - 1 x daily - 7 x weekly - 2 sets - 10 reps - 5-10 sec hold - Isometric Shoulder Flexion at Wall  - 1 x daily - 7 x weekly - 2 sets - 10 reps - 5-10 sec hold   Access Code: [email or web address] URL: [email or web address] Date: 11/18/2023 Prepared by: Hazeline Lister  Exercises - Seated Scapular Retraction  - 1 x daily - 7 x weekly - 2 sets - 10 reps - Supine Shoulder External Rotation AAROM with Dowel  - 1 x daily - 7 x weekly - 3 sets - 10 reps - Supine Shoulder Flexion Overhead with Dowel  - 1 x daily - 7 x weekly - 3 sets - 10 reps - Supine Shoulder Press with Dowel  - 1 x daily - 7 x weekly - 3 sets - 10 reps - Supine Shoulder Abduction AAROM with Dowel  - 1 x daily - 7 x weekly - 3 sets - 10 reps - Supine Shoulder Flexion AAROM with Dowel  - 1 x daily - 7 x weekly - 3 sets - 10 reps  Access Code: [email or web address] URL: [email or web address] Date: 12/02/2023 Prepared by: Hazeline Lister  Exercises - Supine  Shoulder External Rotation AAROM with Dowel  - 1 x daily - 5 x weekly - 3 sets - 10 reps - Supine Shoulder Flexion Overhead with Dowel  - 1 x daily - 5 x weekly - 3 sets - 10 reps - Supine Shoulder Press with Dowel  - 1 x daily - 5 x weekly - 3 sets - 10 reps - Supine Shoulder Abduction AAROM with Dowel  - 1 x daily - 5 x weekly - 3 sets - 10 reps - Supine Shoulder Flexion AAROM with Dowel  - 1 x daily - 5 x weekly - 3 sets - 10 reps - Shoulder extension with resistance - Neutral  - 1 x daily - 5 x weekly - 3 sets - 10 reps - Standing Tricep Extensions with Resistance  - 1 x daily - 5 x weekly - 3 sets - 10 reps - Standing Single Arm Bicep Curls Supinated with Dumbbell  - 1 x daily - 5 x weekly - 3 sets - 10 reps  ASSESSMENT:  CLINICAL IMPRESSION:  Tx. Focused on L shoulder A/AROM in pain tolerable range and progression of L shoulder functional reaching/ strengthening.  Pt. Challenged with eccentric L shoulder control after sh. Flexion static holds and functional reaching with 1# dumbbell.  Increase resistance during tricep extension at Nautilus with good technique and fatigue noted.   Pt. Remains motivated to increase L shoulder motor control and will remain compliant with HEP/ daily use of UE.   Pt. Will benefit from skilled PT services to increase L shoulder ROM/ strength to promote return to PLOF/ mobility with SPC.       OBJECTIVE IMPAIRMENTS: decreased activity tolerance, decreased endurance, decreased ROM, decreased strength, hypomobility, impaired flexibility, impaired sensation, improper body mechanics, postural dysfunction, and pain.   ACTIVITY LIMITATIONS: carrying, lifting, bathing, toileting, dressing, self feeding, reach over head, and hygiene/grooming  PARTICIPATION LIMITATIONS: meal prep, cleaning, laundry, and shopping  PERSONAL FACTORS: Fitness and Past/current experiences are also affecting patient's functional outcome.   REHAB POTENTIAL: Good  CLINICAL  DECISION MAKING:  Stable/uncomplicated  EVALUATION COMPLEXITY: Moderate   GOALS: Goals reviewed with patient? Yes  LONG TERM GOALS: Target date: 04/26/24  Pt. Able to complete HEP with no increase c/o L shoulder pain to improve L sh. Joint mobility/ stability.   Baseline: 5/12: minimal c/o L shoulder pain/ soreness Goal status: Partially met  2.  Pt. Will increase L shoulder AROM to >100 deg. Flexion to improve management of hair/ feeding.   Baseline: no L shoulder AROM at this time.   2/24:  pt. Challenged managing hair with compensatory movement patterns.  4/15: see above chart.  5/12: standing AAROM with isometric hold at 115 deg.  AROM <90 deg. Goal status: Not met  3.  Pt. Able to ambulate with use of SPC on L with consistent recip. Gait pattern and on c/o L shoulder pain to improve household mobility.   Baseline: Pt. Unable to use L shoulder/ SPC at this time.  5/12: pt. Ambulate with mod. I and use of SPC on L.  No recent falls.  Goal status: Goal met  4.  Pt. Will be able to dress/ groom independently with no shoulder pain/ limitations.    Baseline: limited with use of L shoulder during dressing.  5/12: limited with L shoulder IR behind back and bring L hand to head.    Goal status: Partially met  PLAN:  PT FREQUENCY: 1-2x/week  PT DURATION: 8 weeks  PLANNED INTERVENTIONS: 97110-Therapeutic exercises, 97530- Therapeutic activity, [id number]- Neuromuscular re-education, 97535- Self Care, 16109- Manual therapy, Patient/Family education, Joint mobilization, DME instructions, Cryotherapy, and Moist heat  PLAN FOR NEXT SESSION:  Progress nautilus exercises to both seated and standing positions.    Lendell Quarry, PT, DPT # [phone] Physical Therapist - [facility] 03/24/2024, 7:20 PM

## 2024-03-29 ENCOUNTER — Ambulatory Visit: Admitting: Physical Therapy

## 2024-03-29 ENCOUNTER — Encounter: Payer: Self-pay | Admitting: Physical Therapy

## 2024-03-29 DIAGNOSIS — M25612 Stiffness of left shoulder, not elsewhere classified: Secondary | ICD-10-CM | POA: Diagnosis not present

## 2024-03-29 DIAGNOSIS — M6281 Muscle weakness (generalized): Secondary | ICD-10-CM

## 2024-03-29 DIAGNOSIS — M25512 Pain in left shoulder: Secondary | ICD-10-CM

## 2024-03-29 NOTE — Therapy (Signed)
 OUTPATIENT PHYSICAL THERAPY SHOULDER TREATMENT  Patient Name: Evelyn Cisneros MRN: [id number] DOB:Aug 02, 1941, 83 y.o., female Today's Date: 03/29/2024  END OF SESSION:  PT End of Session - 03/29/24 1343     Visit Number 26    Number of Visits 36    Date for PT Re-Evaluation 04/26/24    PT Start Time 1340    PT Stop Time 1431    PT Time Calculation (min) 51 min    Activity Tolerance Patient tolerated treatment well    Behavior During Therapy WFL for tasks assessed/performed             Past Medical History:  Diagnosis Date   Anemia    Arthritis    Heart murmur    Hypertension    Hypothyroidism    Spondylolisthesis of lumbar region    Past Surgical History:  Procedure Laterality Date   ABDOMINAL HYSTERECTOMY     REPLACEMENT TOTAL KNEE BILATERAL     SPINE SURGERY     Patient Active Problem List   Diagnosis Date Noted   Spondylolisthesis of lumbar region 05/04/2018   Abnormal glucose level 02/12/2018   Abnormal weight gain 02/12/2018   Allergic rhinitis 02/12/2018   Asthma 02/12/2018   Bradycardia 02/12/2018   Edema 02/12/2018   Heart murmur 02/12/2018   Hypercalcemia 02/12/2018   Hypokalemia 02/12/2018   Pure hypercholesterolemia 02/12/2018   Shoulder joint pain 02/12/2018   Skin sensation disturbance 02/12/2018   Syncope 02/12/2018   Vitamin D  deficiency 02/12/2018   Central cervical cord injury, 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, 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) 04/24/2017   Muscle spasticity 04/24/2017   Weight loss 12/19/2016   Hypothyroidism 11/24/2016   Bilateral leg edema 11/24/2016   Cervical radiculopathy 11/24/2016   Disc degeneration, lumbar 11/24/2016   Essential hypertension 11/24/2016   Gallstones 11/24/2016   History of anxiety state 11/24/2016   Hyperlipidemia, unspecified 11/24/2016   Spinal stenosis of lumbar region with neurogenic claudication 11/08/2016   Anxiety 06/08/2015   PCP: Levell Reach, MD  REFERRING PROVIDER: Neomia Banner, PA-C  REFERRING  DIAG: [phone] (ICD-10-CM) - Anterior dislocation of left humerus   THERAPY DIAG:  Post-traumatic stiffness of left shoulder joint  Acute pain of left shoulder  Muscle weakness (generalized)  Rationale for Evaluation and Treatment: Rehabilitation  ONSET DATE: 10/20/23  SUBJECTIVE:                                                                                                                                                                                      SUBJECTIVE STATEMENT: Pt. Reports falling while pushing shopping cart in parking lot at [place].  Pt. Hit head resulting in lacerations/ stitches  and L anterior shoulder dislocation.  Pt. Reports 0/10 L shoulder pain at rest and 2/10 pain with eating/brushing teeth.  Pt. Was using SPC on L prior to fall but limited due to shoulder sling.  Pt. Entered PT in w/c today due to inability to use L UE at this time.  Hand dominance: Left  PERTINENT HISTORY: Subjective   Evelyn Cisneros is a 83 y.o. female in today for: HPI: History of Present Illness  Patient here today for suture removal. She underwent lac repair of her forehead after a fall on concrete 12/30. Repaired 2 lacs with 2 interrupted sutures and 6 running locked sutures.   Today, patient reports healing well. She has had a nodule come up on her left wrist. It is mildly tender to palpation, does not bother her otherwise.   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 90 tablet 3  atorvastatin  (LIPITOR) 40 MG tablet TAKE 1 TABLET(40 MG) BY MOUTH EVERY DAY 90 tablet 3  biotin 5 mg Tab Take by mouth  busPIRone (BUSPAR) 5 MG tablet Take 1 tablet (5 mg total) by mouth 2 (two) times daily as needed 30 tablet 5  calcium  lactate 100 mg calcium  Tab Take by mouth  cholecalciferol , vitamin D3, (VITAMIN D3) 125 mcg (5,000 unit) Tab Take 1 tablet by mouth every morning 90 tablet 3  co-enzyme Q-10, ubiquinone, 200 mg capsule Take 200 mg by mouth every morning.   cyanocobalamin (VITAMIN B12) 1,000 mcg SL tablet Take by mouth  ferrous sulfate  325 (65 FE) MG tablet Take 1 tablet (325 mg total) by mouth twice a week 90 tablet 3  FUROsemide  (LASIX ) 20 MG tablet Take 1-2 tablets (20-40 mg total) by mouth once daily as needed for Edema 60 tablet 4  gabapentin  (NEURONTIN ) 300 MG capsule TAKE ONE CAPSULE BY MOUTH THREE TIMES DAILY TO FOUR TIMES DAILY AS NEEDED FOR PAIN 360 capsule 3  Lactobacillus acidophilus (PROBIOTIC ORAL) Take by mouth once daily  levothyroxine  (SYNTHROID ) 50 MCG tablet Take 1.5 tablets (75 mcg total) by mouth once daily 135 tablet 3  lidocaine  (LIDODERM ) 5 % patch as needed  losartan  (COZAAR ) 100 MG tablet TAKE 1 TABLET(100 MG) BY MOUTH EVERY DAY 90 tablet 3  MAGNESIUM ORAL Take by mouth  VIT C/VIT E AC/LUT/COPPER/ZINC  (PRESERVISION LUTEIN ORAL) Take 1 tablet by mouth every morning. Last dose one week prior to procedure  ZINC  ORAL Take 50 mg by mouth once daily   No facility-administered medications prior to visit.   Objective   Vitals:  10/30/23 1350  PainSc: 0-No pain   There is no height or weight on file to calculate BMI. Home Vitals:  Physical Exam Constitutional:  General: She is not in acute distress. Appearance: Normal appearance.  HENT:  Head: Normocephalic and atraumatic.  Eyes:  General: No  scleral icterus. Extraocular Movements: Extraocular movements intact.  Conjunctiva/sclera: Conjunctivae normal.  Cardiovascular:  Rate and Rhythm: Normal rate and regular rhythm.  Pulmonary:  Effort: Pulmonary effort is normal. No respiratory distress.  Breath sounds: Normal breath sounds.  Musculoskeletal:  General: Normal range of motion.  Cervical back: Normal range of motion and neck supple.  Comments: Head lacerations with good interval healing. All sutures visualized for removal.   Left hand with significant bruising from PIPs to upper wrist, interval resolution present. There is a cystic lesion, nonmobile, mildly tender to manipulation present in her posterior central wrist.  Skin: General: Skin is warm and dry.  Coloration: Skin is not jaundiced.  Findings: No erythema or rash.  Neurological:  General: No focal deficit present.  Mental Status: She is alert.  Cranial Nerves: No cranial nerve deficit.  Coordination: Coordination normal.   Assessment & Plan  Diagnoses and all orders for this visit:  Visit for suture removal - Running central scalp suture and 2x interrupted suture of right scalp removed without difficulty. Area clean with good interval healing to wound.  - Recommend gentle washing with soap and water. Avoid aggressive scrubbing. Keep area clean and dry. Reviewed warning s/s for wound healing or new onset infection requiring further evaluation.  Wrist lesion - Possible ganglion cyst vs healing blister pocket, difficult to evaluate fully with overall bruising and injury from her recent fall. Reviewed possible diagnoses. If pain, worsening swelling, numbness/tingling or movement limitations recommend earlier evaluation. Otherwise recommend evaluation by orthopedics at her follow up if symptoms do not resolve with the rest of her injuries.   PAIN:  Are you having pain? Yes: NPRS scale: 2/10 Pain location: L shoulder Pain description: sharp/aching Aggravating  factors: movement/ brushing teeth Relieving factors: rest/ use of sling  PRECAUTIONS: Shoulder  RED FLAGS: None   WEIGHT BEARING RESTRICTIONS: No  FALLS:  Has patient fallen in last 6 months? Yes. Number of falls 2  LIVING ENVIRONMENT: Lives with: lives with their spouse Lives in: House/apartment Has following equipment at home: Single point cane and Wheelchair (manual)  OCCUPATION: Retired  PLOF: Independent with household mobility with device  PATIENT GOALS:  Increase L shoulder AROM/ strength/ pain-free mobility.    NEXT MD VISIT: 11/14/23 with Dr. Lorenz Romano (ortho MD)  OBJECTIVE:  Note: Objective measures were completed at Evaluation unless otherwise noted.  DIAGNOSTIC FINDINGS:  See imaging  PATIENT SURVEYS:  FOTO initial 32/ goal 30   COGNITION: Overall cognitive status: Within functional limits for tasks assessed     SENSATION: WFL.  Moderate L hand bruising.    POSTURE: Rounded shoulders/ forward posture.  Use of L shoulder sling.    UPPER EXTREMITY ROM:  Full tear of R supraspinatus reported.    Passive ROM Right Eval AROM Left Eval PROM  Shoulder flexion 74 deg. 53 deg.  Shoulder extension    Shoulder abduction 70 deg. 42 deg.  Shoulder adduction    Shoulder internal rotation  [facility]  Shoulder external rotation  0 deg.  Elbow flexion [facility] WFL  Elbow extension [facility] WFL  Wrist flexion    Wrist extension    Wrist ulnar deviation    Wrist radial deviation    Wrist pronation    Wrist supination    (Blank rows = not tested)  UPPER EXTREMITY MMT: No MMT testing today.    SHOULDER SPECIAL TESTS: NT  JOINT MOBILITY TESTING:  No L shoulder joint mobs. Secondary to anterior dislocation.    PALPATION:  Generalized L hand tenderness/ bruising.    4/15:  Passive ROM Right  seated AROM Left seated PROM  Shoulder flexion 82 deg. 95 deg.  Shoulder extension    Shoulder abduction 74 deg. 88 deg.  Shoulder adduction    Shoulder internal rotation  [facility]  Shoulder external rotation 30 deg. 0 deg.  Elbow flexion [facility] WFL  Elbow extension [facility]  Wrist flexion    Wrist extension    Wrist ulnar deviation    Wrist radial deviation    Wrist pronation    Wrist supination    (Blank rows = not tested)                                                                                                             TREATMENT DATE: 03/29/2024  Subjective:  Pt. States she was busy over the weekend getting ready for upcoming 60th wedding anniversary this weekend.  Pt. Reports 1/10 L shoulder discomfort prior to tx.  No falls.    There. Act.    B UBE 2 min. F/b (consistent cadence/ R hand grasp)  Standing wall ladder (sh. Flexion)- 5x to blue tape  Nautilus: AAROM/min. Assist with all movements:  PT assists with R hand placement/ grasp on wand.   Seated lat. pull down 30# 20x.    Standing tricep extension 30# 20x.   Standing scap. Retraction 30# 20x.   Standing functional reaching with wash cloth (CW/CCW) at hospital table.  L shoulder flexion over yoga block with L/R UE (use of tennis ball/ 1# wt.).  Challenged with L UE and compensates with biceps/elbow flexion.    Seated L shoulder manual isometrics (IR/ER) 5x each with min. To mod. Resistance.    NOT TODAY  Standing functional reaching at rolling mirror with use of Blaze Pods (random program) with focus on L shoulder ROM below eye level.  Moderate fatigue and benefits from light PT assist.  Increase L UT compensatory patterns noted with increase fatigue.    Seated L shoulder AAROM (with SPC):  chest press/ sh. Flexion (PT assist)/ bicep curls (supinated) 10x2 with mirror feedback.     Standing at shelves (64 in. 2nd shelf)- L overhead reaching with cones.  Light PT assist at elbow and cuing manage shoulder/UE.   Nautilus: Standing bicep curls 10# 20x.   Standing shoulder shrugs/ scapular retraction.  Discussed HEP  Seated STM to L and R shoulder and UT musculature during sh. ROM  reassessment.  Moderate L shoulder (posterior aspect)/ bicep tenderness with STM.   Seated L shoulder PROM (flexion/ abduction/ ER)- 5x each.  Encouraged active holds at tolerable passive end-range.   PATIENT EDUCATION: Education details: Reviewed HEP Person educated: Patient and Spouse Education method: Medical illustrator Education comprehension: verbalized understanding and returned demonstration  HOME EXERCISE PROGRAM: Access Code: [email or web address] URL: [email or web address] Date: 11/06/2023 Prepared by: Hazeline Lister  Exercises - Seated Scapular Retraction  - 1 x daily - 7 x weekly - 2 sets - 10 reps - Standing Isometric Shoulder External Rotation with Doorway  - 1 x daily - 7 x weekly - 2 sets - 10  reps - 5-10sec hold - Isometric Shoulder Abduction at Wall  - 1 x daily - 7 x weekly - 2 sets - 10 reps - 5-10 sec hold - Isometric Shoulder Flexion at Wall  - 1 x daily - 7 x weekly - 2 sets - 10 reps - 5-10 sec hold   Access Code: [email or web address] URL: [email or web address] Date: 11/18/2023 Prepared by: Hazeline Lister  Exercises - Seated Scapular Retraction  - 1 x daily - 7 x weekly - 2 sets - 10 reps - Supine Shoulder External Rotation AAROM with Dowel  - 1 x daily - 7 x weekly - 3 sets - 10 reps - Supine Shoulder Flexion Overhead with Dowel  - 1 x daily - 7 x weekly - 3 sets - 10 reps - Supine Shoulder Press with Dowel  - 1 x daily - 7 x weekly - 3 sets - 10 reps - Supine Shoulder Abduction AAROM with Dowel  - 1 x daily - 7 x weekly - 3 sets - 10 reps - Supine Shoulder Flexion AAROM with Dowel  - 1 x daily - 7 x weekly - 3 sets - 10 reps  Access Code: [email or web address] URL: [email or web address] Date: 12/02/2023 Prepared by: Hazeline Lister  Exercises - Supine Shoulder External Rotation AAROM with Dowel  - 1 x daily - 5 x weekly - 3 sets - 10 reps - Supine Shoulder Flexion Overhead with Dowel  - 1 x daily - 5 x weekly - 3 sets - 10 reps - Supine  Shoulder Press with Dowel  - 1 x daily - 5 x weekly - 3 sets - 10 reps - Supine Shoulder Abduction AAROM with Dowel  - 1 x daily - 5 x weekly - 3 sets - 10 reps - Supine Shoulder Flexion AAROM with Dowel  - 1 x daily - 5 x weekly - 3 sets - 10 reps - Shoulder extension with resistance - Neutral  - 1 x daily - 5 x weekly - 3 sets - 10 reps - Standing Tricep Extensions with Resistance  - 1 x daily - 5 x weekly - 3 sets - 10 reps - Standing Single Arm Bicep Curls Supinated with Dumbbell  - 1 x daily - 5 x weekly - 3 sets - 10 reps  ASSESSMENT:  CLINICAL IMPRESSION:  Tx. Focused on L shoulder A/AROM in pain tolerable range and progression of L shoulder functional reaching/ strengthening.  Pt. Challenged with eccentric L shoulder control after sh. Flexion static holds and functional reaching with 1# dumbbell.  Increase resistance during tricep extension at Nautilus with good technique and fatigue noted.   Pt. Remains motivated to increase L shoulder motor control and will remain compliant with HEP/ daily use of UE.   Pt. Will benefit from skilled PT services to increase L shoulder ROM/ strength to promote return to PLOF/ mobility with SPC.       OBJECTIVE IMPAIRMENTS: decreased activity tolerance, decreased endurance, decreased ROM, decreased strength, hypomobility, impaired flexibility, impaired sensation, improper body mechanics, postural dysfunction, and pain.   ACTIVITY LIMITATIONS: carrying, lifting, bathing, toileting, dressing, self feeding, reach over head, and hygiene/grooming  PARTICIPATION LIMITATIONS: meal prep, cleaning, laundry, and shopping  PERSONAL FACTORS: Fitness and Past/current experiences are also affecting patient's functional outcome.   REHAB POTENTIAL: Good  CLINICAL DECISION MAKING: Stable/uncomplicated  EVALUATION COMPLEXITY: Moderate   GOALS: Goals reviewed with patient? Yes  LONG TERM GOALS: Target date: 04/26/24  Pt. Able to complete HEP with no increase c/o L  shoulder  pain to improve L sh. Joint mobility/ stability.   Baseline: 5/12: minimal c/o L shoulder pain/ soreness Goal status: Partially met  2.  Pt. Will increase L shoulder AROM to >100 deg. Flexion to improve management of hair/ feeding.   Baseline: no L shoulder AROM at this time.   2/24:  pt. Challenged managing hair with compensatory movement patterns.  4/15: see above chart.  5/12: standing AAROM with isometric hold at 115 deg.  AROM <90 deg. Goal status: Not met  3.  Pt. Able to ambulate with use of SPC on L with consistent recip. Gait pattern and on c/o L shoulder pain to improve household mobility.   Baseline: Pt. Unable to use L shoulder/ SPC at this time.  5/12: pt. Ambulate with mod. I and use of SPC on L.  No recent falls.  Goal status: Goal met  4.  Pt. Will be able to dress/ groom independently with no shoulder pain/ limitations.    Baseline: limited with use of L shoulder during dressing.  5/12: limited with L shoulder IR behind back and bring L hand to head.    Goal status: Partially met  PLAN:  PT FREQUENCY: 1-2x/week  PT DURATION: 8 weeks  PLANNED INTERVENTIONS: 97110-Therapeutic exercises, 97530- Therapeutic activity, [id number]- Neuromuscular re-education, 97535- Self Care, 16109- Manual therapy, Patient/Family education, Joint mobilization, DME instructions, Cryotherapy, and Moist heat  PLAN FOR NEXT SESSION:  Progress nautilus exercises to both seated and standing positions.    Lendell Quarry, PT, DPT # [phone] Physical Therapist - [facility] 03/29/2024, 2:46 PM

## 2024-04-07 ENCOUNTER — Ambulatory Visit: Admitting: Physical Therapy

## 2024-04-07 ENCOUNTER — Encounter: Payer: Self-pay | Admitting: Physical Therapy

## 2024-04-07 DIAGNOSIS — M25512 Pain in left shoulder: Secondary | ICD-10-CM

## 2024-04-07 DIAGNOSIS — M25612 Stiffness of left shoulder, not elsewhere classified: Secondary | ICD-10-CM

## 2024-04-07 DIAGNOSIS — M6281 Muscle weakness (generalized): Secondary | ICD-10-CM

## 2024-04-07 NOTE — Therapy (Signed)
 OUTPATIENT PHYSICAL THERAPY SHOULDER TREATMENT  Patient Name: Evelyn Cisneros MRN: [id number] DOB:1940-11-24, 83 y.o., female Today's Date: 04/07/2024  END OF SESSION:  PT End of Session - 04/07/24 1055     Visit Number 27    Number of Visits 36    Date for PT Re-Evaluation 04/26/24    PT Start Time 1110    PT Stop Time 1157    PT Time Calculation (min) 47 min    Activity Tolerance Patient tolerated treatment well    Behavior During Therapy WFL for tasks assessed/performed          Past Medical History:  Diagnosis Date   Anemia    Arthritis    Heart murmur    Hypertension    Hypothyroidism    Spondylolisthesis of lumbar region    Past Surgical History:  Procedure Laterality Date   ABDOMINAL HYSTERECTOMY     REPLACEMENT TOTAL KNEE BILATERAL     SPINE SURGERY     Patient Active Problem List   Diagnosis Date Noted   Spondylolisthesis of lumbar region 05/04/2018   Abnormal glucose level 02/12/2018   Abnormal weight gain 02/12/2018   Allergic rhinitis 02/12/2018   Asthma 02/12/2018   Bradycardia 02/12/2018   Edema 02/12/2018   Heart murmur 02/12/2018   Hypercalcemia 02/12/2018   Hypokalemia 02/12/2018   Pure hypercholesterolemia 02/12/2018   Shoulder joint pain 02/12/2018   Skin sensation disturbance 02/12/2018   Syncope 02/12/2018   Vitamin D  deficiency 02/12/2018   Central cervical cord injury, 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, 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) 04/24/2017   Muscle spasticity 04/24/2017   Weight loss 12/19/2016   Hypothyroidism 11/24/2016   Bilateral leg edema 11/24/2016   Cervical radiculopathy 11/24/2016   Disc degeneration, lumbar 11/24/2016   Essential hypertension 11/24/2016   Gallstones 11/24/2016   History of anxiety state 11/24/2016   Hyperlipidemia, unspecified 11/24/2016   Spinal stenosis of lumbar region with neurogenic claudication 11/08/2016   Anxiety 06/08/2015   PCP: Levell Reach, MD  REFERRING PROVIDER: Neomia Banner, PA-C  REFERRING DIAG:  [phone] (ICD-10-CM) - Anterior dislocation of left humerus   THERAPY DIAG:  Post-traumatic stiffness of left shoulder joint  Acute pain of left shoulder  Muscle weakness (generalized)  Rationale for Evaluation and Treatment: Rehabilitation  ONSET DATE: 10/20/23  SUBJECTIVE:                                                                                                                                                                                      SUBJECTIVE STATEMENT: Pt. Reports falling while pushing shopping cart in parking lot at [place].  Pt. Hit head resulting in lacerations/ stitches and L anterior  shoulder dislocation.  Pt. Reports 0/10 L shoulder pain at rest and 2/10 pain with eating/brushing teeth.  Pt. Was using SPC on L prior to fall but limited due to shoulder sling.  Pt. Entered PT in w/c today due to inability to use L UE at this time.  Hand dominance: Left  PERTINENT HISTORY: Subjective   Evelyn Cisneros is a 83 y.o. female in today for: HPI: History of Present Illness  Patient here today for suture removal. She underwent lac repair of her forehead after a fall on concrete 12/30. Repaired 2 lacs with 2 interrupted sutures and 6 running locked sutures.   Today, patient reports healing well. She has had a nodule come up on her left wrist. It is mildly tender to palpation, does not bother her otherwise.   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 90 tablet 3  atorvastatin  (LIPITOR) 40 MG tablet TAKE 1 TABLET(40 MG) BY MOUTH EVERY DAY 90 tablet 3  biotin 5 mg Tab Take by mouth  busPIRone (BUSPAR) 5 MG tablet Take 1 tablet (5 mg total) by mouth 2 (two) times daily as needed 30 tablet 5  calcium  lactate 100 mg calcium  Tab Take by mouth  cholecalciferol , vitamin D3, (VITAMIN D3) 125 mcg (5,000 unit) Tab Take 1 tablet by mouth every morning 90 tablet 3  co-enzyme Q-10, ubiquinone, 200 mg capsule Take 200 mg by mouth every morning.   cyanocobalamin (VITAMIN B12) 1,000 mcg SL tablet Take by mouth  ferrous sulfate  325 (65 FE) MG tablet Take 1 tablet (325 mg total) by mouth twice a week 90 tablet 3  FUROsemide  (LASIX ) 20 MG tablet Take 1-2 tablets (20-40 mg total) by mouth once daily as needed for Edema 60 tablet 4  gabapentin  (NEURONTIN ) 300 MG capsule TAKE ONE CAPSULE BY MOUTH THREE TIMES DAILY TO FOUR TIMES DAILY AS NEEDED FOR PAIN 360 capsule 3  Lactobacillus acidophilus (PROBIOTIC ORAL) Take by mouth once daily  levothyroxine  (SYNTHROID ) 50 MCG tablet Take 1.5 tablets (75 mcg total) by mouth once daily 135 tablet 3  lidocaine  (LIDODERM ) 5 % patch as needed  losartan  (COZAAR ) 100 MG tablet TAKE 1 TABLET(100 MG) BY MOUTH EVERY DAY 90 tablet 3  MAGNESIUM ORAL Take by mouth  VIT C/VIT E AC/LUT/COPPER/ZINC  (PRESERVISION LUTEIN ORAL) Take 1 tablet by mouth every morning. Last dose one week prior to procedure  ZINC  ORAL Take 50 mg by mouth once daily   No facility-administered medications prior to visit.   Objective   Vitals:  10/30/23 1350  PainSc: 0-No pain   There is no height or weight on file to calculate BMI. Home Vitals:  Physical Exam Constitutional:  General: She is not in acute distress. Appearance: Normal appearance.  HENT:  Head: Normocephalic and atraumatic.  Eyes:  General: No scleral  icterus. Extraocular Movements: Extraocular movements intact.  Conjunctiva/sclera: Conjunctivae normal.  Cardiovascular:  Rate  and Rhythm: Normal rate and regular rhythm.  Pulmonary:  Effort: Pulmonary effort is normal. No respiratory distress.  Breath sounds: Normal breath sounds.  Musculoskeletal:  General: Normal range of motion.  Cervical back: Normal range of motion and neck supple.  Comments: Head lacerations with good interval healing. All sutures visualized for removal.   Left hand with significant bruising from PIPs to upper wrist, interval resolution present. There is a cystic lesion, nonmobile, mildly tender to manipulation present in her posterior central wrist.  Skin: General: Skin is warm and dry.  Coloration: Skin is not jaundiced.  Findings: No erythema or rash.  Neurological:  General: No focal deficit present.  Mental Status: She is alert.  Cranial Nerves: No cranial nerve deficit.  Coordination: Coordination normal.   Assessment & Plan  Diagnoses and all orders for this visit:  Visit for suture removal - Running central scalp suture and 2x interrupted suture of right scalp removed without difficulty. Area clean with good interval healing to wound.  - Recommend gentle washing with soap and water. Avoid aggressive scrubbing. Keep area clean and dry. Reviewed warning s/s for wound healing or new onset infection requiring further evaluation.  Wrist lesion - Possible ganglion cyst vs healing blister pocket, difficult to evaluate fully with overall bruising and injury from her recent fall. Reviewed possible diagnoses. If pain, worsening swelling, numbness/tingling or movement limitations recommend earlier evaluation. Otherwise recommend evaluation by orthopedics at her follow up if symptoms do not resolve with the rest of her injuries.   PAIN:  Are you having pain? Yes: NPRS scale: 2/10 Pain location: L shoulder Pain description: sharp/aching Aggravating factors:  movement/ brushing teeth Relieving factors: rest/ use of sling  PRECAUTIONS: Shoulder  RED FLAGS: None   WEIGHT BEARING RESTRICTIONS: No  FALLS:  Has patient fallen in last 6 months? Yes. Number of falls 2  LIVING ENVIRONMENT: Lives with: lives with their spouse Lives in: House/apartment Has following equipment at home: Single point cane and Wheelchair (manual)  OCCUPATION: Retired  PLOF: Independent with household mobility with device  PATIENT GOALS:  Increase L shoulder AROM/ strength/ pain-free mobility.    NEXT MD VISIT: 11/14/23 with Dr. Lorenz Romano (ortho MD)  OBJECTIVE:  Note: Objective measures were completed at Evaluation unless otherwise noted.  DIAGNOSTIC FINDINGS:  See imaging  PATIENT SURVEYS:  FOTO initial 32/ goal 67   COGNITION: Overall cognitive status: Within functional limits for tasks assessed     SENSATION: WFL.  Moderate L hand bruising.    POSTURE: Rounded shoulders/ forward posture.  Use of L shoulder sling.    UPPER EXTREMITY ROM:  Full tear of R supraspinatus reported.    Passive ROM Right Eval AROM Left Eval PROM  Shoulder flexion 74 deg. 53 deg.  Shoulder extension    Shoulder abduction 70 deg. 42 deg.  Shoulder adduction    Shoulder internal rotation  [facility]  Shoulder external rotation  0 deg.  Elbow flexion [facility] WFL  Elbow extension [facility] WFL  Wrist flexion    Wrist extension    Wrist ulnar deviation    Wrist radial deviation    Wrist pronation    Wrist supination    (Blank rows = not tested)  UPPER EXTREMITY MMT: No MMT testing today.    SHOULDER SPECIAL TESTS: NT  JOINT MOBILITY TESTING:  No L shoulder joint mobs. Secondary to anterior dislocation.    PALPATION:  Generalized L hand tenderness/ bruising.    4/15:  Passive ROM Right seated AROM Left  seated PROM  Shoulder flexion 82 deg. 95 deg.  Shoulder extension    Shoulder abduction 74 deg. 88 deg.  Shoulder adduction    Shoulder internal rotation [facility]   Shoulder external rotation 30 deg. 0 deg.  Elbow flexion [facility] WFL  Elbow extension [facility]  Wrist flexion    Wrist extension    Wrist ulnar deviation    Wrist radial deviation    Wrist pronation    Wrist supination    (Blank rows = not tested)                                                                          TREATMENT DATE: 04/07/2024  Subjective:  Pt. Reports having a great weekend with family.  Pt. Is tired today and reports no L shoulder pain prior to tx. Session.    There. Act.    B UBE 2 min. F/b (consistent cadence/ R hand grasp)  Standing wall ladder (sh. Flexion)- 5x to blue tape. Good scapular mobility.    Nautilus: AAROM/min. Assist with all movements:  PT assists with R hand placement/ grasp on wand.   Seated lat. pull down 30# 20x2.    Standing tricep extension 30# 20x2.   Standing scap. Retraction 30# 20x/ 20# 20x.   Seated L shoulder manual isometrics (flexion/abduction) 5x each with min. To mod. Resistance.  Pain limited with abduction/ limited hold time.   Seated L shoulder PROM (flexion/ abduction/ ER)- 5x each.  Encouraged active holds at tolerable passive end-range.     NOT TODAY  Standing functional reaching at rolling mirror with use of Blaze Pods (random program) with focus on L shoulder ROM below eye level.  Moderate fatigue and benefits from light PT assist.  Increase L UT compensatory patterns noted with increase fatigue.    Seated L shoulder AAROM (with SPC):  chest press/ sh. Flexion (PT assist)/ bicep curls (supinated) 10x2 with mirror feedback.     Standing at shelves (64 in. 2nd shelf)- L overhead reaching with cones.  Light PT assist at elbow and cuing manage shoulder/UE.   Nautilus: Standing bicep curls 10# 20x.   Standing shoulder shrugs/ scapular retraction.  Discussed HEP  Seated STM to L and R shoulder and UT musculature during sh. ROM reassessment.  Moderate L shoulder (posterior aspect)/ bicep tenderness with STM.   PATIENT  EDUCATION: Education details: Reviewed HEP Person educated: Patient and Spouse Education method: Medical illustrator Education comprehension: verbalized understanding and returned demonstration  HOME EXERCISE PROGRAM: Access Code: [email or web address] URL: [email or web address] Date: 11/06/2023 Prepared by: Hazeline Lister  Exercises - Seated Scapular Retraction  - 1 x daily - 7 x weekly - 2 sets - 10 reps - Standing Isometric Shoulder External Rotation with Doorway  - 1 x daily - 7 x weekly - 2 sets - 10 reps - 5-10sec hold - Isometric Shoulder Abduction at Wall  - 1 x daily - 7 x weekly - 2 sets - 10 reps - 5-10 sec hold - Isometric Shoulder Flexion at Wall  - 1 x daily - 7 x weekly - 2 sets - 10 reps - 5-10 sec hold   Access Code: [email or web address] URL: [email or web address] Date: 11/18/2023 Prepared by: Hazeline Lister  Exercises - Seated Scapular Retraction  - 1 x daily - 7 x weekly - 2 sets - 10 reps - Supine Shoulder External Rotation AAROM with Dowel  - 1 x daily - 7 x weekly - 3 sets - 10 reps - Supine Shoulder Flexion Overhead with Dowel  - 1 x daily - 7 x weekly - 3 sets - 10 reps - Supine Shoulder Press with Dowel  - 1 x daily - 7 x weekly - 3 sets - 10 reps - Supine Shoulder Abduction AAROM with Dowel  - 1 x daily - 7 x weekly - 3 sets - 10 reps - Supine Shoulder Flexion AAROM with Dowel  - 1 x daily - 7 x weekly - 3 sets - 10 reps  Access Code: [email or web address] URL: [email or web address] Date: 12/02/2023 Prepared by: Hazeline Lister  Exercises - Supine Shoulder External Rotation AAROM with Dowel  - 1 x daily - 5 x weekly - 3 sets - 10 reps - Supine Shoulder Flexion Overhead with Dowel  - 1 x daily - 5 x weekly - 3 sets - 10 reps - Supine Shoulder Press with Dowel  - 1 x daily - 5 x weekly - 3 sets - 10 reps - Supine Shoulder Abduction AAROM with Dowel  - 1 x daily - 5 x weekly - 3 sets - 10 reps - Supine Shoulder Flexion AAROM with Dowel  - 1 x  daily - 5 x weekly - 3 sets - 10 reps - Shoulder extension with resistance - Neutral  - 1 x daily - 5 x weekly - 3 sets - 10 reps - Standing Tricep Extensions with Resistance  - 1 x daily - 5 x weekly - 3 sets - 10 reps - Standing Single Arm Bicep Curls Supinated with Dumbbell  - 1 x daily - 5 x weekly - 3 sets - 10 reps  ASSESSMENT:  CLINICAL IMPRESSION:  Tx. Focused on L shoulder A/AROM and strengthening in pain tolerable range to progress L shoulder functional reaching/ ADLs.  Good sh. Control with static isometrics during sh. Flexion but limited with L shoulder abduction holds secondary to pain/ weakness.   Pt. Remains motivated to increase L shoulder motor control and will remain compliant with HEP/ daily use of UE.   Pt. Will benefit from skilled PT services to increase L shoulder ROM/ strength to promote return to PLOF/ mobility with SPC.       OBJECTIVE IMPAIRMENTS: decreased activity tolerance, decreased endurance, decreased ROM, decreased strength, hypomobility, impaired flexibility, impaired sensation, improper body mechanics, postural dysfunction, and pain.   ACTIVITY LIMITATIONS: carrying, lifting, bathing, toileting, dressing, self feeding, reach over head, and hygiene/grooming  PARTICIPATION LIMITATIONS: meal prep, cleaning, laundry, and shopping  PERSONAL FACTORS: Fitness and Past/current experiences are also affecting patient's functional outcome.   REHAB POTENTIAL: Good  CLINICAL DECISION MAKING: Stable/uncomplicated  EVALUATION COMPLEXITY: Moderate   GOALS: Goals reviewed with patient? Yes  LONG TERM GOALS: Target date: 04/26/24  Pt. Able to complete HEP with no increase c/o L shoulder pain to improve L sh. Joint mobility/ stability.   Baseline: 5/12: minimal c/o L shoulder pain/ soreness Goal status: Partially met  2.  Pt. Will increase L shoulder AROM to >100 deg. Flexion to improve management of hair/ feeding.   Baseline: no L shoulder AROM at this time.   2/24:   pt. Challenged managing hair with compensatory movement patterns.  4/15: see above chart.  5/12: standing AAROM with isometric hold at 115 deg.  AROM <90 deg. Goal status: Not met  3.  Pt. Able to ambulate with use of SPC on L with consistent recip. Gait pattern and on c/o L shoulder pain to improve household mobility.   Baseline: Pt. Unable to use L shoulder/ SPC at this time.  5/12: pt. Ambulate with mod. I and use of SPC on L.  No recent falls.  Goal status: Goal met  4.  Pt. Will be able to dress/ groom independently with no shoulder pain/ limitations.    Baseline: limited with use of L shoulder during dressing.  5/12: limited with L shoulder IR behind back and bring L hand to head.    Goal status: Partially met  PLAN:  PT FREQUENCY: 1-2x/week  PT DURATION: 8 weeks  PLANNED INTERVENTIONS: 97110-Therapeutic exercises, 97530- Therapeutic activity, [id number]- Neuromuscular re-education, 97535- Self Care, [id number]- Manual therapy, Patient/Family education, Joint mobilization, DME instructions, Cryotherapy, and Moist heat  PLAN FOR NEXT SESSION:  Progress nautilus exercises to both seated and standing positions.  Recheck L AROM  Lendell Quarry, PT, DPT # [phone] Physical Therapist - [facility] 04/07/2024, 1:26 PM

## 2024-04-12 ENCOUNTER — Ambulatory Visit: Admitting: Physical Therapy

## 2024-04-12 ENCOUNTER — Encounter: Payer: Self-pay | Admitting: Physical Therapy

## 2024-04-12 DIAGNOSIS — M25612 Stiffness of left shoulder, not elsewhere classified: Secondary | ICD-10-CM

## 2024-04-12 DIAGNOSIS — M25512 Pain in left shoulder: Secondary | ICD-10-CM

## 2024-04-12 DIAGNOSIS — M6281 Muscle weakness (generalized): Secondary | ICD-10-CM

## 2024-04-12 NOTE — Therapy (Incomplete)
 OUTPATIENT PHYSICAL THERAPY SHOULDER TREATMENT  Patient Name: Evelyn Cisneros MRN: [id number] DOB:01/03/1941, 83 y.o., female Today's Date: 04/13/2024  END OF SESSION:  PT End of Session - 04/12/24 1112     Visit Number 28    Number of Visits 36    Date for PT Re-Evaluation 04/26/24    PT Start Time 1112    PT Stop Time 1201    PT Time Calculation (min) 49 min    Activity Tolerance Patient tolerated treatment well    Behavior During Therapy WFL for tasks assessed/performed          Past Medical History:  Diagnosis Date   Anemia    Arthritis    Heart murmur    Hypertension    Hypothyroidism    Spondylolisthesis of lumbar region    Past Surgical History:  Procedure Laterality Date   ABDOMINAL HYSTERECTOMY     REPLACEMENT TOTAL KNEE BILATERAL     SPINE SURGERY     Patient Active Problem List   Diagnosis Date Noted   Spondylolisthesis of lumbar region 05/04/2018   Abnormal glucose level 02/12/2018   Abnormal weight gain 02/12/2018   Allergic rhinitis 02/12/2018   Asthma 02/12/2018   Bradycardia 02/12/2018   Edema 02/12/2018   Heart murmur 02/12/2018   Hypercalcemia 02/12/2018   Hypokalemia 02/12/2018   Pure hypercholesterolemia 02/12/2018   Shoulder joint pain 02/12/2018   Skin sensation disturbance 02/12/2018   Syncope 02/12/2018   Vitamin D  deficiency 02/12/2018   Central cervical cord injury, 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, 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) 04/24/2017   Muscle spasticity 04/24/2017   Weight loss 12/19/2016   Hypothyroidism 11/24/2016   Bilateral leg edema 11/24/2016   Cervical radiculopathy 11/24/2016   Disc degeneration, lumbar 11/24/2016   Essential hypertension 11/24/2016   Gallstones 11/24/2016   History of anxiety state 11/24/2016   Hyperlipidemia, unspecified 11/24/2016   Spinal stenosis of lumbar region with neurogenic claudication 11/08/2016   Anxiety 06/08/2015   PCP: Evelyn Cisneros  REFERRING PROVIDER: Teresa Beryl CROME, Cisneros  REFERRING DIAG:  [phone] (ICD-10-CM) - Anterior dislocation of left humerus   THERAPY DIAG:  Post-traumatic stiffness of left shoulder joint  Acute pain of left shoulder  Muscle weakness (generalized)  Rationale for Evaluation and Treatment: Rehabilitation  ONSET DATE: 10/20/23  SUBJECTIVE:                                                                                                                                                                                      SUBJECTIVE STATEMENT: Pt. Reports falling while pushing shopping cart in parking lot at [place].  Pt. Hit head resulting in lacerations/ stitches and L anterior  shoulder dislocation.  Pt. Reports 0/10 L shoulder pain at rest and 2/10 pain with eating/brushing teeth.  Pt. Was using SPC on L prior to fall but limited due to shoulder sling.  Pt. Entered PT in w/c today due to inability to use L UE at this time.  Hand dominance: Left  PERTINENT HISTORY: Subjective   Evelyn Cisneros is a 83 y.o. female in today for: HPI: History of Present Illness  Patient here today for suture removal. She underwent lac repair of her forehead after a fall on concrete 12/30. Repaired 2 lacs with 2 interrupted sutures and 6 running locked sutures.   Today, patient reports healing well. She has had a nodule come up on her left wrist. It is mildly tender to palpation, does not bother her otherwise.   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 90 tablet 3  atorvastatin  (LIPITOR) 40 MG tablet TAKE 1 TABLET(40 MG) BY MOUTH EVERY DAY 90 tablet 3  biotin 5 mg Tab Take by mouth  busPIRone (BUSPAR) 5 MG tablet Take 1 tablet (5 mg total) by mouth 2 (two) times daily as needed 30 tablet 5  calcium  lactate 100 mg calcium  Tab Take by mouth  cholecalciferol , vitamin D3, (VITAMIN D3) 125 mcg (5,000 unit) Tab Take 1 tablet by mouth every morning 90 tablet 3  co-enzyme Q-10, ubiquinone, 200 mg capsule Take 200 mg by mouth every morning.   cyanocobalamin (VITAMIN B12) 1,000 mcg SL tablet Take by mouth  ferrous sulfate  325 (65 FE) MG tablet Take 1 tablet (325 mg total) by mouth twice a week 90 tablet 3  FUROsemide  (LASIX ) 20 MG tablet Take 1-2 tablets (20-40 mg total) by mouth once daily as needed for Edema 60 tablet 4  gabapentin  (NEURONTIN ) 300 MG capsule TAKE ONE CAPSULE BY MOUTH THREE TIMES DAILY TO FOUR TIMES DAILY AS NEEDED FOR PAIN 360 capsule 3  Lactobacillus acidophilus (PROBIOTIC ORAL) Take by mouth once daily  levothyroxine  (SYNTHROID ) 50 MCG tablet Take 1.5 tablets (75 mcg total) by mouth once daily 135 tablet 3  lidocaine  (LIDODERM ) 5 % patch as needed  losartan  (COZAAR ) 100 MG tablet TAKE 1 TABLET(100 MG) BY MOUTH EVERY DAY 90 tablet 3  MAGNESIUM ORAL Take by mouth  VIT C/VIT E AC/LUT/COPPER/ZINC  (PRESERVISION LUTEIN ORAL) Take 1 tablet by mouth every morning. Last dose one week prior to procedure  ZINC  ORAL Take 50 mg by mouth once daily   No facility-administered medications prior to visit.   Objective   Vitals:  10/30/23 1350  PainSc: 0-No pain   There is no height or weight on file to calculate BMI. Home Vitals:  Physical Exam Constitutional:  General: She is not in acute distress. Appearance: Normal appearance.  HENT:  Head: Normocephalic and atraumatic.  Eyes:  General: No scleral  icterus. Extraocular Movements: Extraocular movements intact.  Conjunctiva/sclera: Conjunctivae normal.  Cardiovascular:  Rate  and Rhythm: Normal rate and regular rhythm.  Pulmonary:  Effort: Pulmonary effort is normal. No respiratory distress.  Breath sounds: Normal breath sounds.  Musculoskeletal:  General: Normal range of motion.  Cervical back: Normal range of motion and neck supple.  Comments: Head lacerations with good interval healing. All sutures visualized for removal.   Left hand with significant bruising from PIPs to upper wrist, interval resolution present. There is a cystic lesion, nonmobile, mildly tender to manipulation present in her posterior central wrist.  Skin: General: Skin is warm and dry.  Coloration: Skin is not jaundiced.  Findings: No erythema or rash.  Neurological:  General: No focal deficit present.  Mental Status: She is alert.  Cranial Nerves: No cranial nerve deficit.  Coordination: Coordination normal.   Assessment & Plan  Diagnoses and all orders for this visit:  Visit for suture removal - Running central scalp suture and 2x interrupted suture of right scalp removed without difficulty. Area clean with good interval healing to wound.  - Recommend gentle washing with soap and water. Avoid aggressive scrubbing. Keep area clean and dry. Reviewed warning s/s for wound healing or new onset infection requiring further evaluation.  Wrist lesion - Possible ganglion cyst vs healing blister pocket, difficult to evaluate fully with overall bruising and injury from her recent fall. Reviewed possible diagnoses. If pain, worsening swelling, numbness/tingling or movement limitations recommend earlier evaluation. Otherwise recommend evaluation by orthopedics at her follow up if symptoms do not resolve with the rest of her injuries.   PAIN:  Are you having pain? Yes: NPRS scale: 2/10 Pain location: L shoulder Pain description: sharp/aching Aggravating factors:  movement/ brushing teeth Relieving factors: rest/ use of sling  PRECAUTIONS: Shoulder  RED FLAGS: None   WEIGHT BEARING RESTRICTIONS: No  FALLS:  Has patient fallen in last 6 months? Yes. Number of falls 2  LIVING ENVIRONMENT: Lives with: lives with their spouse Lives in: House/apartment Has following equipment at home: Single point cane and Wheelchair (manual)  OCCUPATION: Retired  PLOF: Independent with household mobility with device  PATIENT GOALS:  Increase L shoulder AROM/ strength/ pain-free mobility.    NEXT Cisneros VISIT: 11/14/23 with Dr. Gini (ortho Cisneros)  OBJECTIVE:  Note: Objective measures were completed at Evaluation unless otherwise noted.  DIAGNOSTIC FINDINGS:  See imaging  PATIENT SURVEYS:  FOTO initial 32/ goal 74   COGNITION: Overall cognitive status: Within functional limits for tasks assessed     SENSATION: WFL.  Moderate L hand bruising.    POSTURE: Rounded shoulders/ forward posture.  Use of L shoulder sling.    UPPER EXTREMITY ROM:  Full tear of R supraspinatus reported.    Passive ROM Right Eval AROM Left Eval PROM  Shoulder flexion 74 deg. 53 deg.  Shoulder extension    Shoulder abduction 70 deg. 42 deg.  Shoulder adduction    Shoulder internal rotation  [facility]  Shoulder external rotation  0 deg.  Elbow flexion [facility] WFL  Elbow extension [facility] WFL  Wrist flexion    Wrist extension    Wrist ulnar deviation    Wrist radial deviation    Wrist pronation    Wrist supination    (Blank rows = not tested)  UPPER EXTREMITY MMT: No MMT testing today.    SHOULDER SPECIAL TESTS: NT  JOINT MOBILITY TESTING:  No L shoulder joint mobs. Secondary to anterior dislocation.    PALPATION:  Generalized L hand tenderness/ bruising.    4/15:  Passive ROM Right seated AROM Left  seated PROM  Shoulder flexion 82 deg. 95 deg.  Shoulder extension    Shoulder abduction 74 deg. 88 deg.  Shoulder adduction    Shoulder internal rotation [facility]   Shoulder external rotation 30 deg. 0 deg.  Elbow flexion [facility] WFL  Elbow extension [facility]  Wrist flexion    Wrist extension    Wrist ulnar deviation    Wrist radial deviation    Wrist pronation    Wrist supination    (Blank rows = not tested)                                                                          TREATMENT DATE: 04/13/2024  Subjective:  Pt. Reports having a great weekend.  Pt. Is tired today and reports no L shoulder pain prior to tx. Session.  Pt. did report a fall in closet without injury but had to contact EMS to assist with getting off the ground.  Pts. Husband was unable to assist due to recent back surgery/ lifting restrictions.      There. Act.    B UBE 2 min. F/b (consistent cadence/ R hand grasp)  Standing wall ladder (sh. Flexion)- 5x to blue tape. Good scapular mobility.    Nautilus: AAROM/min. Assist with all movements:  PT assists with R hand placement/ grasp on wand.   Seated lat. pull down 30# 20x2.    Standing tricep extension 30# 20x2.   Standing scap. Retraction 30# 20x/ 20# 20x.   Seated L shoulder manual isometrics (flexion/abduction) 5x each with min. To mod. Resistance.  Pain limited with abduction/ limited hold time.   Seated L shoulder PROM (flexion/ abduction/ ER)- 5x each.  Encouraged active holds at tolerable passive end-range.    Seated STM to L and R shoulder and UT musculature during sh. ROM reassessment.  Moderate L shoulder (posterior aspect)/ bicep tenderness with STM.    NOT TODAY  Standing functional reaching at rolling mirror with use of Blaze Pods (random program) with focus on L shoulder ROM below eye level.  Moderate fatigue and benefits from light PT assist.  Increase L UT compensatory patterns noted with increase fatigue.    Seated L shoulder AAROM (with SPC):  chest press/ sh. Flexion (PT assist)/ bicep curls (supinated) 10x2 with mirror feedback.     Standing at shelves (64 in. 2nd shelf)- L overhead reaching with  cones.  Light PT assist at elbow and cuing manage shoulder/UE.   Nautilus: Standing bicep curls 10# 20x.   Standing shoulder shrugs/ scapular retraction.  Discussed HEP  PATIENT EDUCATION: Education details: Reviewed HEP Person educated: Patient and Spouse Education method: Medical illustrator Education comprehension: verbalized understanding and returned demonstration  HOME EXERCISE PROGRAM: Access Code: [email or web address] URL: [email or web address] Date: 11/06/2023 Prepared by: Ozell Sero  Exercises - Seated Scapular Retraction  - 1 x daily - 7 x weekly - 2 sets - 10 reps - Standing Isometric Shoulder External Rotation with Doorway  - 1 x daily - 7 x weekly - 2 sets - 10 reps - 5-10sec hold - Isometric Shoulder Abduction at Wall  - 1 x daily - 7 x weekly - 2 sets - 10 reps - 5-10 sec hold - Isometric Shoulder  Flexion at Wall  - 1 x daily - 7 x weekly - 2 sets - 10 reps - 5-10 sec hold   Access Code: [email or web address] URL: [email or web address] Date: 11/18/2023 Prepared by: Ozell Sero  Exercises - Seated Scapular Retraction  - 1 x daily - 7 x weekly - 2 sets - 10 reps - Supine Shoulder External Rotation AAROM with Dowel  - 1 x daily - 7 x weekly - 3 sets - 10 reps - Supine Shoulder Flexion Overhead with Dowel  - 1 x daily - 7 x weekly - 3 sets - 10 reps - Supine Shoulder Press with Dowel  - 1 x daily - 7 x weekly - 3 sets - 10 reps - Supine Shoulder Abduction AAROM with Dowel  - 1 x daily - 7 x weekly - 3 sets - 10 reps - Supine Shoulder Flexion AAROM with Dowel  - 1 x daily - 7 x weekly - 3 sets - 10 reps  Access Code: [email or web address] URL: [email or web address] Date: 12/02/2023 Prepared by: Ozell Sero  Exercises - Supine Shoulder External Rotation AAROM with Dowel  - 1 x daily - 5 x weekly - 3 sets - 10 reps - Supine Shoulder Flexion Overhead with Dowel  - 1 x daily - 5 x weekly - 3 sets - 10 reps - Supine Shoulder Press with Dowel  -  1 x daily - 5 x weekly - 3 sets - 10 reps - Supine Shoulder Abduction AAROM with Dowel  - 1 x daily - 5 x weekly - 3 sets - 10 reps - Supine Shoulder Flexion AAROM with Dowel  - 1 x daily - 5 x weekly - 3 sets - 10 reps - Shoulder extension with resistance - Neutral  - 1 x daily - 5 x weekly - 3 sets - 10 reps - Standing Tricep Extensions with Resistance  - 1 x daily - 5 x weekly - 3 sets - 10 reps - Standing Single Arm Bicep Curls Supinated with Dumbbell  - 1 x daily - 5 x weekly - 3 sets - 10 reps  ASSESSMENT:  CLINICAL IMPRESSION:  Tx. Focused on L shoulder A/AROM and strengthening in pain tolerable range to progress L shoulder functional reaching/ ADLs.  Good sh. Control with static isometrics during sh. Flexion but limited with L shoulder abduction holds secondary to pain/ weakness.   Pt. Remains motivated to increase L shoulder motor control and will remain compliant with HEP/ daily use of UE.   Pt. Will benefit from skilled PT services to increase L shoulder ROM/ strength to promote return to PLOF/ mobility with SPC.       OBJECTIVE IMPAIRMENTS: decreased activity tolerance, decreased endurance, decreased ROM, decreased strength, hypomobility, impaired flexibility, impaired sensation, improper body mechanics, postural dysfunction, and pain.   ACTIVITY LIMITATIONS: carrying, lifting, bathing, toileting, dressing, self feeding, reach over head, and hygiene/grooming  PARTICIPATION LIMITATIONS: meal prep, cleaning, laundry, and shopping  PERSONAL FACTORS: Fitness and Past/current experiences are also affecting patient's functional outcome.   REHAB POTENTIAL: Good  CLINICAL DECISION MAKING: Stable/uncomplicated  EVALUATION COMPLEXITY: Moderate   GOALS: Goals reviewed with patient? Yes  LONG TERM GOALS: Target date: 04/26/24  Pt. Able to complete HEP with no increase c/o L shoulder pain to improve L sh. Joint mobility/ stability.   Baseline: 5/12: minimal c/o L shoulder pain/  soreness Goal status: Partially met  2.  Pt. Will increase L shoulder AROM to >100 deg. Flexion to improve management of hair/ feeding.  Baseline: no L shoulder AROM at this time.   2/24:  pt. Challenged managing hair with compensatory movement patterns.  4/15: see above chart.  5/12: standing AAROM with isometric hold at 115 deg.  AROM <90 deg. Goal status: Not met  3.  Pt. Able to ambulate with use of SPC on L with consistent recip. Gait pattern and on c/o L shoulder pain to improve household mobility.   Baseline: Pt. Unable to use L shoulder/ SPC at this time.  5/12: pt. Ambulate with mod. I and use of SPC on L.  No recent falls.  Goal status: Goal met  4.  Pt. Will be able to dress/ groom independently with no shoulder pain/ limitations.    Baseline: limited with use of L shoulder during dressing.  5/12: limited with L shoulder IR behind back and bring L hand to head.    Goal status: Partially met  PLAN:  PT FREQUENCY: 1-2x/week  PT DURATION: 8 weeks  PLANNED INTERVENTIONS: 97110-Therapeutic exercises, 97530- Therapeutic activity, [id number]- Neuromuscular re-education, 97535- Self Care, [id number]- Manual therapy, Patient/Family education, Joint mobilization, DME instructions, Cryotherapy, and Moist heat  PLAN FOR NEXT SESSION:  Progress nautilus exercises to both seated and standing positions.  Recheck L AROM.  Discuss gait/ balance Cisneros order.    Ozell JAYSON Sero, PT, DPT # [phone] Physical Therapist - [facility] 04/13/2024, 12:46 PM

## 2024-04-14 ENCOUNTER — Encounter: Payer: Self-pay | Admitting: Physical Therapy

## 2024-04-14 ENCOUNTER — Ambulatory Visit: Admitting: Physical Therapy

## 2024-04-14 DIAGNOSIS — M6281 Muscle weakness (generalized): Secondary | ICD-10-CM

## 2024-04-14 DIAGNOSIS — M25612 Stiffness of left shoulder, not elsewhere classified: Secondary | ICD-10-CM

## 2024-04-14 DIAGNOSIS — M25512 Pain in left shoulder: Secondary | ICD-10-CM

## 2024-04-14 NOTE — Therapy (Signed)
 OUTPATIENT PHYSICAL THERAPY SHOULDER TREATMENT Physical Therapy Progress Note  Dates of reporting period  03/03/24   to   04/14/24   Patient Name: Evelyn Cisneros MRN: [id number] DOB:[birth year], 83 y.o., female Today's Date: 04/15/2024  END OF SESSION:  PT End of Session - 04/14/24 1052     Visit Number 30    Number of Visits 36    Date for PT Re-Evaluation 04/26/24    PT Start Time 1106    PT Stop Time 1155    PT Time Calculation (min) 49 min          Past Medical History:  Diagnosis Date   Anemia    Arthritis    Heart murmur    Hypertension    Hypothyroidism    Spondylolisthesis of lumbar region    Past Surgical History:  Procedure Laterality Date   ABDOMINAL HYSTERECTOMY     REPLACEMENT TOTAL KNEE BILATERAL     SPINE SURGERY     Patient Active Problem List   Diagnosis Date Noted   Spondylolisthesis of lumbar region 05/04/2018   Abnormal glucose level 02/12/2018   Abnormal weight gain 02/12/2018   Allergic rhinitis 02/12/2018   Asthma 02/12/2018   Bradycardia 02/12/2018   Edema 02/12/2018   Heart murmur 02/12/2018   Hypercalcemia 02/12/2018   Hypokalemia 02/12/2018   Pure hypercholesterolemia 02/12/2018   Shoulder joint pain 02/12/2018   Skin sensation disturbance 02/12/2018   Syncope 02/12/2018   Vitamin D  deficiency 02/12/2018   Central cervical cord injury, 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, 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) 04/24/2017   Muscle spasticity 04/24/2017   Weight loss 12/19/2016   Hypothyroidism 11/24/2016   Bilateral leg edema 11/24/2016   Cervical radiculopathy 11/24/2016   Disc degeneration, lumbar 11/24/2016   Essential hypertension 11/24/2016   Gallstones 11/24/2016   History of anxiety state 11/24/2016   Hyperlipidemia, unspecified 11/24/2016   Spinal stenosis of lumbar region with neurogenic claudication 11/08/2016   Anxiety 06/08/2015   PCP: Evelyn Boby LABOR, MD  REFERRING PROVIDER: Teresa Beryl CROME, PA-C  REFERRING DIAG: [phone] (ICD-10-CM) - Anterior  dislocation of left humerus   THERAPY DIAG:  Post-traumatic stiffness of left shoulder joint  Acute pain of left shoulder  Muscle weakness (generalized)  Rationale for Evaluation and Treatment: Rehabilitation  ONSET DATE: 10/20/23  SUBJECTIVE:                                                                                                                                                                                      SUBJECTIVE STATEMENT: Pt. Reports falling while pushing shopping cart in parking lot at [place].  Pt. Hit head resulting in lacerations/ stitches and L anterior shoulder  dislocation.  Pt. Reports 0/10 L shoulder pain at rest and 2/10 pain with eating/brushing teeth.  Pt. Was using SPC on L prior to fall but limited due to shoulder sling.  Pt. Entered PT in w/c today due to inability to use L UE at this time.  Hand dominance: Left  PERTINENT HISTORY: Subjective   Evelyn Cisneros is a 83 y.o. female in today for: HPI: History of Present Illness  Patient here today for suture removal. She underwent lac repair of her forehead after a fall on concrete 12/30. Repaired 2 lacs with 2 interrupted sutures and 6 running locked sutures.   Today, patient reports healing well. She has had a nodule come up on her left wrist. It is mildly tender to palpation, does not bother her otherwise.   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 90 tablet 3  atorvastatin  (LIPITOR) 40 MG tablet TAKE 1 TABLET(40 MG) BY MOUTH EVERY DAY 90 tablet 3  biotin 5 mg Tab Take by mouth  busPIRone (BUSPAR) 5 MG tablet Take 1 tablet (5 mg total) by mouth 2 (two) times daily as needed 30 tablet 5  calcium  lactate 100 mg calcium  Tab Take by mouth  cholecalciferol , vitamin D3, (VITAMIN D3) 125 mcg (5,000 unit) Tab Take 1 tablet by mouth every morning 90 tablet 3  co-enzyme Q-10, ubiquinone, 200 mg capsule Take 200 mg by mouth every morning.   cyanocobalamin (VITAMIN B12) 1,000 mcg SL tablet Take by mouth  ferrous sulfate  325 (65 FE) MG tablet Take 1 tablet (325 mg total) by mouth twice a week 90 tablet 3  FUROsemide  (LASIX ) 20 MG tablet Take 1-2 tablets (20-40 mg total) by mouth once daily as needed for Edema 60 tablet 4  gabapentin  (NEURONTIN ) 300 MG capsule TAKE ONE CAPSULE BY MOUTH THREE TIMES DAILY TO FOUR TIMES DAILY AS NEEDED FOR PAIN 360 capsule 3  Lactobacillus acidophilus (PROBIOTIC ORAL) Take by mouth once daily  levothyroxine  (SYNTHROID ) 50 MCG tablet Take 1.5 tablets (75 mcg total) by mouth once daily 135 tablet 3  lidocaine  (LIDODERM ) 5 % patch as needed  losartan  (COZAAR ) 100 MG tablet TAKE 1 TABLET(100 MG) BY MOUTH EVERY DAY 90 tablet 3  MAGNESIUM ORAL Take by mouth  VIT C/VIT E AC/LUT/COPPER/ZINC  (PRESERVISION LUTEIN ORAL) Take 1 tablet by mouth every morning. Last dose one week prior to procedure  ZINC  ORAL Take 50 mg by mouth once daily   No facility-administered medications prior to visit.   Objective   Vitals:  10/30/23 1350  PainSc: 0-No pain   There is no height or weight on file to calculate BMI. Home Vitals:  Physical Exam Constitutional:  General: She is not in acute distress. Appearance: Normal appearance.  HENT:  Head: Normocephalic and atraumatic.  Eyes:  General: No scleral icterus. Extraocular Movements:  Extraocular movements intact.  Conjunctiva/sclera: Conjunctivae normal.  Cardiovascular:  Rate and  Rhythm: Normal rate and regular rhythm.  Pulmonary:  Effort: Pulmonary effort is normal. No respiratory distress.  Breath sounds: Normal breath sounds.  Musculoskeletal:  General: Normal range of motion.  Cervical back: Normal range of motion and neck supple.  Comments: Head lacerations with good interval healing. All sutures visualized for removal.   Left hand with significant bruising from PIPs to upper wrist, interval resolution present. There is a cystic lesion, nonmobile, mildly tender to manipulation present in her posterior central wrist.  Skin: General: Skin is warm and dry.  Coloration: Skin is not jaundiced.  Findings: No erythema or rash.  Neurological:  General: No focal deficit present.  Mental Status: She is alert.  Cranial Nerves: No cranial nerve deficit.  Coordination: Coordination normal.   Assessment & Plan  Diagnoses and all orders for this visit:  Visit for suture removal - Running central scalp suture and 2x interrupted suture of right scalp removed without difficulty. Area clean with good interval healing to wound.  - Recommend gentle washing with soap and water. Avoid aggressive scrubbing. Keep area clean and dry. Reviewed warning s/s for wound healing or new onset infection requiring further evaluation.  Wrist lesion - Possible ganglion cyst vs healing blister pocket, difficult to evaluate fully with overall bruising and injury from her recent fall. Reviewed possible diagnoses. If pain, worsening swelling, numbness/tingling or movement limitations recommend earlier evaluation. Otherwise recommend evaluation by orthopedics at her follow up if symptoms do not resolve with the rest of her injuries.   PAIN:  Are you having pain? Yes: NPRS scale: 2/10 Pain location: L shoulder Pain description: sharp/aching Aggravating factors: movement/ brushing  teeth Relieving factors: rest/ use of sling  PRECAUTIONS: Shoulder  RED FLAGS: None   WEIGHT BEARING RESTRICTIONS: No  FALLS:  Has patient fallen in last 6 months? Yes. Number of falls 2  LIVING ENVIRONMENT: Lives with: lives with their spouse Lives in: House/apartment Has following equipment at home: Single point cane and Wheelchair (manual)  OCCUPATION: Retired  PLOF: Independent with household mobility with device  PATIENT GOALS:  Increase L shoulder AROM/ strength/ pain-free mobility.    NEXT MD VISIT: 11/14/23 with Dr. Gini (ortho MD)  OBJECTIVE:  Note: Objective measures were completed at Evaluation unless otherwise noted.  DIAGNOSTIC FINDINGS:  See imaging  PATIENT SURVEYS:  FOTO initial 32/ goal 81   COGNITION: Overall cognitive status: Within functional limits for tasks assessed     SENSATION: WFL.  Moderate L hand bruising.    POSTURE: Rounded shoulders/ forward posture.  Use of L shoulder sling.    UPPER EXTREMITY ROM:  Full tear of R supraspinatus reported.    Passive ROM Right Eval AROM Left Eval PROM  Shoulder flexion 74 deg. 53 deg.  Shoulder extension    Shoulder abduction 70 deg. 42 deg.  Shoulder adduction    Shoulder internal rotation  [facility]  Shoulder external rotation  0 deg.  Elbow flexion [facility] WFL  Elbow extension [facility] WFL  Wrist flexion    Wrist extension    Wrist ulnar deviation    Wrist radial deviation    Wrist pronation    Wrist supination    (Blank rows = not tested)  UPPER EXTREMITY MMT: No MMT testing today.    SHOULDER SPECIAL TESTS: NT  JOINT MOBILITY TESTING:  No L shoulder joint mobs. Secondary to anterior dislocation.    PALPATION:  Generalized L hand tenderness/ bruising.    4/15:  Passive ROM Right seated AROM Left seated  PROM  Shoulder flexion 82 deg. 95 deg.  Shoulder extension    Shoulder abduction 74 deg. 88 deg.  Shoulder adduction    Shoulder internal rotation [facility]  Shoulder external  rotation 30 deg. 0 deg.  Elbow flexion [facility] WFL  Elbow extension [facility]  Wrist flexion    Wrist extension    Wrist ulnar deviation    Wrist radial deviation    Wrist pronation    Wrist supination    (Blank rows = not tested)                                                                          TREATMENT DATE: 04/15/2024  Subjective: Pt. Reports doing well with no new complaints.  No falls since last PT tx. Session.  PT sent a new MD order to assess gait/balance next tx. Session.    There. Act.    B UBE 2 min. F/b (consistent cadence/ R hand grasp)- no rest breaks  Standing wall ladder (sh. Flexion)- 5x to red heart sticker.  Good scapular mobility.   Standing in //-bars at mirror with Blaze Pods: L shoulder reaching at varying planes of movement with light PT assist (1 min. X 3).     Passive ROM Right seated AROM Left seated P/AROM  Shoulder flexion 84 deg. 128/48 deg.  Shoulder extension    Shoulder abduction 78 deg. 124/88 deg.  Shoulder adduction    Shoulder internal rotation [facility]  Shoulder external rotation 30 deg. 8 deg.  Elbow flexion [facility] WFL  Elbow extension [facility]  Wrist flexion    Wrist extension    Wrist ulnar deviation    Wrist radial deviation    Wrist pronation    Wrist supination    (Blank rows = not tested)   Nautilus: AAROM/min. Assist with all movements:  PT assists with R hand placement/ grasp on single handles (no wand today).     Seated lat. pull down 30# 20x2.    Standing tricep extension 20# 20x2.   Standing scap. Retraction 30# 20x2.   Seated L shoulder manual isometrics (flexion/abduction) 5x each with min. To mod. Resistance.  Pain limited with abduction/ limited hold time.   Seated L shoulder PROM (flexion/ abduction/ ER)- 5x each.  Encouraged active holds at tolerable passive end-range.    Seated STM to L and R shoulder and UT musculature during sh. ROM reassessment.  Moderate L shoulder (posterior aspect)/ bicep tenderness with  STM.    NOT TODAY  Standing functional reaching at rolling mirror with use of Blaze Pods (random program) with focus on L shoulder ROM below eye level.  Moderate fatigue and benefits from light PT assist.  Increase L UT compensatory patterns noted with increase fatigue.    Seated L shoulder AAROM (with SPC):  chest press/ sh. Flexion (PT assist)/ bicep curls (supinated) 10x2 with mirror feedback.     Standing at shelves (64 in. 2nd shelf)- L overhead reaching with cones.  Light PT assist at elbow and cuing manage shoulder/UE.   Nautilus: Standing bicep curls 10# 20x.   Standing shoulder shrugs/ scapular retraction.  Discussed HEP  PATIENT EDUCATION: Education details: Reviewed HEP Person educated: Patient and Spouse Education method:  Explanation and Demonstration Education comprehension: verbalized understanding and returned demonstration  HOME EXERCISE PROGRAM: Access Code: [email or web address] URL: [email or web address] Date: 11/06/2023 Prepared by: Ozell Sero  Exercises - Seated Scapular Retraction  - 1 x daily - 7 x weekly - 2 sets - 10 reps - Standing Isometric Shoulder External Rotation with Doorway  - 1 x daily - 7 x weekly - 2 sets - 10 reps - 5-10sec hold - Isometric Shoulder Abduction at Wall  - 1 x daily - 7 x weekly - 2 sets - 10 reps - 5-10 sec hold - Isometric Shoulder Flexion at Wall  - 1 x daily - 7 x weekly - 2 sets - 10 reps - 5-10 sec hold   Access Code: [email or web address] URL: [email or web address] Date: 11/18/2023 Prepared by: Ozell Sero  Exercises - Seated Scapular Retraction  - 1 x daily - 7 x weekly - 2 sets - 10 reps - Supine Shoulder External Rotation AAROM with Dowel  - 1 x daily - 7 x weekly - 3 sets - 10 reps - Supine Shoulder Flexion Overhead with Dowel  - 1 x daily - 7 x weekly - 3 sets - 10 reps - Supine Shoulder Press with Dowel  - 1 x daily - 7 x weekly - 3 sets - 10 reps - Supine Shoulder Abduction AAROM with Dowel  - 1 x daily  - 7 x weekly - 3 sets - 10 reps - Supine Shoulder Flexion AAROM with Dowel  - 1 x daily - 7 x weekly - 3 sets - 10 reps  Access Code: [email or web address] URL: [email or web address] Date: 12/02/2023 Prepared by: Ozell Sero  Exercises - Supine Shoulder External Rotation AAROM with Dowel  - 1 x daily - 5 x weekly - 3 sets - 10 reps - Supine Shoulder Flexion Overhead with Dowel  - 1 x daily - 5 x weekly - 3 sets - 10 reps - Supine Shoulder Press with Dowel  - 1 x daily - 5 x weekly - 3 sets - 10 reps - Supine Shoulder Abduction AAROM with Dowel  - 1 x daily - 5 x weekly - 3 sets - 10 reps - Supine Shoulder Flexion AAROM with Dowel  - 1 x daily - 5 x weekly - 3 sets - 10 reps - Shoulder extension with resistance - Neutral  - 1 x daily - 5 x weekly - 3 sets - 10 reps - Standing Tricep Extensions with Resistance  - 1 x daily - 5 x weekly - 3 sets - 10 reps - Standing Single Arm Bicep Curls Supinated with Dumbbell  - 1 x daily - 5 x weekly - 3 sets - 10 reps  ASSESSMENT:  CLINICAL IMPRESSION:  Tx. Focused on L shoulder A/AROM and strengthening in pain tolerable range to progress L shoulder functional reaching/ ADLs.  Good sh. Control with static isometrics during sh. Flexion but limited with L shoulder abduction holds secondary to pain/ weakness.  See updated L shoulder A/PROM (chart above).   Pt. Remains motivated to increase L shoulder motor control and will remain compliant with HEP/ daily use of UE.   Pt. Will benefit from skilled PT services to increase L shoulder ROM/ strength to promote return to PLOF/ mobility with SPC.       OBJECTIVE IMPAIRMENTS: decreased activity tolerance, decreased endurance, decreased ROM, decreased strength, hypomobility, impaired flexibility, impaired sensation, improper body mechanics, postural dysfunction, and pain.   ACTIVITY LIMITATIONS: carrying, lifting, bathing, toileting, dressing, self feeding, reach over head, and  hygiene/grooming  PARTICIPATION  LIMITATIONS: meal prep, cleaning, laundry, and shopping  PERSONAL FACTORS: Fitness and Past/current experiences are also affecting patient's functional outcome.   REHAB POTENTIAL: Good  CLINICAL DECISION MAKING: Stable/uncomplicated  EVALUATION COMPLEXITY: Moderate   GOALS: Goals reviewed with patient? Yes  LONG TERM GOALS: Target date: 04/26/24  Pt. Able to complete HEP with no increase c/o L shoulder pain to improve L sh. Joint mobility/ stability.   Baseline: 5/12: minimal c/o L shoulder pain/ soreness Goal status: Partially met  2.  Pt. Will increase L shoulder AROM to >100 deg. Flexion to improve management of hair/ feeding.   Baseline: no L shoulder AROM at this time.   2/24:  pt. Challenged managing hair with compensatory movement patterns.  4/15: see above chart.  5/12: standing AAROM with isometric hold at 115 deg.  AROM <90 deg. Goal status: Not met  3.  Pt. Able to ambulate with use of SPC on L with consistent recip. Gait pattern and on c/o L shoulder pain to improve household mobility.   Baseline: Pt. Unable to use L shoulder/ SPC at this time.  5/12: pt. Ambulate with mod. I and use of SPC on L.  No recent falls.  Goal status: Goal met  4.  Pt. Will be able to dress/ groom independently with no shoulder pain/ limitations.    Baseline: limited with use of L shoulder during dressing.  5/12: limited with L shoulder IR behind back and bring L hand to head.    Goal status: Partially met  PLAN:  PT FREQUENCY: 1-2x/week  PT DURATION: 8 weeks  PLANNED INTERVENTIONS: 97110-Therapeutic exercises, 97530- Therapeutic activity, [id number]- Neuromuscular re-education, 97535- Self Care, [id number]- Manual therapy, Patient/Family education, Joint mobilization, DME instructions, Cryotherapy, and Moist heat  PLAN FOR NEXT SESSION:  Progress nautilus exercises to both seated and standing positions.  Discuss gait/ balance MD order.    Ozell JAYSON Sero, PT, DPT # [phone] Physical Therapist - [facility] 04/15/2024, 8:25 AM

## 2024-04-19 ENCOUNTER — Encounter: Payer: Self-pay | Admitting: Physical Therapy

## 2024-04-19 ENCOUNTER — Ambulatory Visit: Admitting: Physical Therapy

## 2024-04-19 DIAGNOSIS — M25612 Stiffness of left shoulder, not elsewhere classified: Secondary | ICD-10-CM | POA: Diagnosis not present

## 2024-04-19 DIAGNOSIS — M25512 Pain in left shoulder: Secondary | ICD-10-CM

## 2024-04-19 DIAGNOSIS — M6281 Muscle weakness (generalized): Secondary | ICD-10-CM

## 2024-04-19 DIAGNOSIS — R269 Unspecified abnormalities of gait and mobility: Secondary | ICD-10-CM

## 2024-04-19 DIAGNOSIS — R2689 Other abnormalities of gait and mobility: Secondary | ICD-10-CM

## 2024-04-19 NOTE — Therapy (Signed)
 OUTPATIENT PHYSICAL THERAPY SHOULDER and BALANCE TREATMENT/ RECERTIFICATION  Patient Name: Evelyn Cisneros MRN: [id number] DOB:1940-11-03, 83 y.o., female Today's Date: 04/19/2024  END OF SESSION:  PT End of Session - 04/19/24 1051     Visit Number 31    Number of Visits 43    Date for PT Re-Evaluation 05/31/24    PT Start Time 1113    PT Stop Time 1202    PT Time Calculation (min) 49 min          Past Medical History:  Diagnosis Date   Anemia    Arthritis    Heart murmur    Hypertension    Hypothyroidism    Spondylolisthesis of lumbar region    Past Surgical History:  Procedure Laterality Date   ABDOMINAL HYSTERECTOMY     REPLACEMENT TOTAL KNEE BILATERAL     SPINE SURGERY     Patient Active Problem List   Diagnosis Date Noted   Spondylolisthesis of lumbar region 05/04/2018   Abnormal glucose level 02/12/2018   Abnormal weight gain 02/12/2018   Allergic rhinitis 02/12/2018   Asthma 02/12/2018   Bradycardia 02/12/2018   Edema 02/12/2018   Heart murmur 02/12/2018   Hypercalcemia 02/12/2018   Hypokalemia 02/12/2018   Pure hypercholesterolemia 02/12/2018   Shoulder joint pain 02/12/2018   Skin sensation disturbance 02/12/2018   Syncope 02/12/2018   Vitamin D  deficiency 02/12/2018   Central cervical cord injury, 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, 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) 04/24/2017   Muscle spasticity 04/24/2017   Weight loss 12/19/2016   Hypothyroidism 11/24/2016   Bilateral leg edema 11/24/2016   Cervical radiculopathy 11/24/2016   Disc degeneration, lumbar 11/24/2016   Essential hypertension 11/24/2016   Gallstones 11/24/2016   History of anxiety state 11/24/2016   Hyperlipidemia, unspecified 11/24/2016   Spinal stenosis of lumbar region with neurogenic claudication 11/08/2016   Anxiety 06/08/2015   PCP: Evelyn Cisneros  REFERRING PROVIDER: Teresa Beryl CROME, Cisneros  REFERRING DIAG: [phone] (ICD-10-CM) - Anterior dislocation of left humerus   THERAPY DIAG:   Post-traumatic stiffness of left shoulder joint  Acute pain of left shoulder  Muscle weakness (generalized)  Gait difficulty  Balance disorder  Rationale for Evaluation and Treatment: Rehabilitation  ONSET DATE: 10/20/23  SUBJECTIVE:                                                                                                                                                                                      SUBJECTIVE STATEMENT: Pt. Reports falling while pushing shopping cart in parking lot at [place].  Pt. Hit head resulting in lacerations/ stitches and L anterior shoulder dislocation.  Pt. Reports 0/10 L shoulder pain at  rest and 2/10 pain with eating/brushing teeth.  Pt. Was using SPC on L prior to fall but limited due to shoulder sling.  Pt. Entered PT in w/c today due to inability to use L UE at this time.  Hand dominance: Left  PERTINENT HISTORY: Subjective   Evelyn Cisneros is a 83 y.o. female in today for: HPI: History of Present Illness  Patient here today for suture removal. She underwent lac repair of her forehead after a fall on concrete 12/30. Repaired 2 lacs with 2 interrupted sutures and 6 running locked sutures.   Today, patient reports healing well. She has had a nodule come up on her left wrist. It is mildly tender to palpation, does not bother her otherwise.   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 90 tablet 3  atorvastatin  (LIPITOR) 40 MG tablet TAKE 1 TABLET(40 MG) BY MOUTH EVERY DAY 90 tablet 3  biotin 5 mg Tab Take by mouth  busPIRone (BUSPAR) 5 MG tablet Take 1 tablet (5 mg total) by mouth 2 (two) times daily as needed 30 tablet 5  calcium  lactate 100 mg calcium  Tab Take by mouth  cholecalciferol , vitamin D3, (VITAMIN D3) 125 mcg (5,000 unit) Tab Take 1 tablet by mouth every morning 90 tablet 3  co-enzyme Q-10, ubiquinone, 200 mg capsule Take 200 mg by mouth every morning.   cyanocobalamin (VITAMIN B12) 1,000 mcg SL tablet Take by mouth  ferrous sulfate  325 (65 FE) MG tablet Take 1 tablet (325 mg total) by mouth twice a week 90 tablet 3  FUROsemide  (LASIX ) 20 MG tablet Take 1-2 tablets (20-40 mg total) by mouth once daily as needed for Edema 60 tablet 4  gabapentin  (NEURONTIN ) 300 MG capsule TAKE ONE CAPSULE BY MOUTH THREE TIMES DAILY TO FOUR TIMES DAILY AS NEEDED FOR PAIN 360 capsule 3  Lactobacillus acidophilus (PROBIOTIC ORAL) Take by mouth once daily  levothyroxine  (SYNTHROID ) 50 MCG tablet Take 1.5 tablets (75 mcg total) by mouth once daily 135 tablet 3  lidocaine  (LIDODERM ) 5 % patch as needed  losartan  (COZAAR ) 100 MG tablet TAKE 1 TABLET(100 MG) BY MOUTH EVERY DAY 90 tablet 3  MAGNESIUM ORAL Take by mouth  VIT C/VIT E AC/LUT/COPPER/ZINC  (PRESERVISION LUTEIN ORAL) Take 1 tablet by mouth every morning. Last dose one week prior to procedure  ZINC  ORAL Take 50 mg by mouth once daily   No facility-administered medications prior to visit.   Objective   Vitals:  10/30/23 1350  PainSc: 0-No pain   There is no height or weight on file to calculate BMI. Home Vitals:  Physical Exam Constitutional:  General: She is not in acute distress. Appearance: Normal appearance.  HENT:  Head: Normocephalic and atraumatic.  Eyes:  General: No scleral icterus. Extraocular Movements:  Extraocular movements intact.  Conjunctiva/sclera: Conjunctivae normal.  Cardiovascular:  Rate and Rhythm: Normal rate and regular rhythm.  Pulmonary:  Effort: Pulmonary effort is normal. No respiratory distress.  Breath sounds: Normal breath sounds.  Musculoskeletal:  General: Normal range of motion.  Cervical back: Normal range of motion and neck supple.  Comments: Head lacerations with good interval healing. All sutures visualized for removal.   Left hand with significant bruising from PIPs to upper wrist, interval resolution present. There is a cystic lesion, nonmobile, mildly tender to manipulation present in her posterior central wrist.  Skin: General: Skin is warm and dry.  Coloration: Skin is not jaundiced.  Findings: No erythema or rash.  Neurological:  General: No focal deficit present.  Mental Status: She is alert.  Cranial Nerves: No cranial nerve deficit.  Coordination: Coordination normal.   Assessment & Plan  Diagnoses and all orders for this visit:  Visit for suture removal - Running central scalp suture and 2x interrupted suture of right scalp removed without difficulty. Area clean with good interval healing to wound.  - Recommend gentle washing with soap and water. Avoid aggressive scrubbing. Keep area clean and dry. Reviewed warning s/s for wound healing or new onset infection requiring further evaluation.  Wrist lesion - Possible ganglion cyst vs healing blister pocket, difficult to evaluate fully with overall bruising and injury from her recent fall. Reviewed possible diagnoses. If pain, worsening swelling, numbness/tingling or movement limitations recommend earlier evaluation. Otherwise recommend evaluation by orthopedics at her follow up if symptoms do not resolve with the rest of her injuries.   PAIN:  Are you having pain? Yes: NPRS scale: 2/10 Pain location: L shoulder Pain description: sharp/aching Aggravating factors: movement/ brushing  teeth Relieving factors: rest/ use of sling  PRECAUTIONS: Shoulder  RED FLAGS: None   WEIGHT BEARING RESTRICTIONS: No  FALLS:  Has patient fallen in last 6 months? Yes. Number of falls 2  LIVING ENVIRONMENT: Lives with: lives with their spouse Lives in: House/apartment Has following equipment at home: Single point cane and Wheelchair (manual)  OCCUPATION: Retired  PLOF: Independent with household mobility with device  PATIENT GOALS:  Increase L shoulder AROM/ strength/ pain-free mobility.    NEXT Cisneros VISIT: 11/14/23 with Dr. Gini (ortho Cisneros)  OBJECTIVE:  Note: Objective measures were completed at Evaluation unless otherwise noted.  DIAGNOSTIC FINDINGS:  See imaging  PATIENT SURVEYS:  FOTO initial 32/ goal 55   COGNITION: Overall cognitive status: Within functional limits for tasks assessed     SENSATION: WFL.  Moderate L hand bruising.    POSTURE: Rounded shoulders/ forward posture.  Use of L shoulder sling.    UPPER EXTREMITY ROM:  Full tear of R supraspinatus reported.    Passive ROM Right Eval AROM Left Eval PROM  Shoulder flexion 74 deg. 53 deg.  Shoulder extension    Shoulder abduction 70 deg. 42 deg.  Shoulder adduction    Shoulder internal rotation  [facility]  Shoulder external rotation  0 deg.  Elbow flexion [facility] WFL  Elbow extension [facility] WFL  Wrist flexion    Wrist extension    Wrist ulnar deviation    Wrist radial deviation    Wrist pronation    Wrist supination    (Blank rows = not tested)  UPPER EXTREMITY MMT: No MMT testing today.    SHOULDER SPECIAL TESTS: NT  JOINT MOBILITY TESTING:  No L shoulder joint mobs. Secondary to anterior dislocation.    PALPATION:  Generalized L hand tenderness/ bruising.    4/15:  Passive ROM Right seated AROM Left seated PROM  Shoulder flexion 82 deg. 95 deg.  Shoulder extension    Shoulder abduction 74 deg. 88 deg.  Shoulder adduction    Shoulder internal rotation [facility]  Shoulder external  rotation 30 deg. 0 deg.  Elbow flexion [facility] WFL  Elbow extension [facility]  Wrist flexion    Wrist extension    Wrist ulnar deviation    Wrist radial deviation    Wrist pronation    Wrist supination    (Blank rows = not tested)    Passive ROM Right seated AROM Left seated P/AROM  Shoulder flexion 84 deg. 128/48 deg.  Shoulder extension    Shoulder abduction 78 deg. 124/88 deg.  Shoulder adduction    Shoulder internal rotation [facility]  Shoulder external rotation 30 deg. 8 deg.  Elbow flexion [facility] WFL  Elbow extension [facility]  Wrist flexion    Wrist extension    Wrist ulnar deviation    Wrist radial deviation    Wrist pronation    Wrist supination    (Blank rows = not tested)                                                                           TREATMENT DATE: 04/19/2024  Subjective: Pt. Reports no falls over the weekend.  See new Cisneros order for balance/gait training.  Pt. Reports being busy at home over the weekend and good compliance with HEP.    There.act.:  Gait assessment:  R antalgic gait with use of SPC.  Limited hip/knee flexion and step length with step to gait pattern.  No arm swing, esp. On R.  1 episode of shuffling R foot on floor resulting in a self-corrected balance check.    B LE MMT: R/L hip flexion (3/4), abduction (4/4), knee extension (4/4+), knee flexion (4+/4+).  Moderate R lower lower leg pitting edema (discussed benefits of compression stockings).    5xSTS:  19.0 sec./ 15.9 sec.   Berg:  40/56 (fall risk).    LEFS:  30 out of 80.    Nautilus: AAROM/min. Assist with all movements (wand).     Seated lat. pull down 30# 20x2.    Standing scap. Retraction 30# 20x2.   Seated L shoulder manual isometrics (flexion/abduction) 5x each with min. To mod. Resistance.  Pain limited with abduction/ limited hold time.   Seated L shoulder PROM (flexion/ abduction/ ER)- 5x each.  Encouraged active holds at tolerable passive end-range.    Seated STM to L and  R shoulder and UT musculature during sh. ROM reassessment.  Moderate L shoulder (posterior aspect)/ bicep tenderness with STM.     PATIENT EDUCATION: Education details: Reviewed HEP Person educated: Patient and Spouse Education method: Medical illustrator Education comprehension: verbalized understanding and returned demonstration  HOME EXERCISE PROGRAM: Access Code: [email or web address] URL: [email or web address] Date: 11/06/2023 Prepared by: Ozell Sero  Exercises - Seated Scapular Retraction  - 1 x daily - 7 x weekly - 2 sets - 10 reps - Standing Isometric Shoulder External Rotation with Doorway  - 1 x daily - 7 x weekly - 2 sets - 10 reps - 5-10sec hold - Isometric Shoulder Abduction at Wall  - 1 x daily - 7 x weekly - 2 sets - 10 reps - 5-10  sec hold - Isometric Shoulder Flexion at Wall  - 1 x daily - 7 x weekly - 2 sets - 10 reps - 5-10 sec hold   Access Code: [email or web address] URL: [email or web address] Date: 11/18/2023 Prepared by: Ozell Sero  Exercises - Seated Scapular Retraction  - 1 x daily - 7 x weekly - 2 sets - 10 reps - Supine Shoulder External Rotation AAROM with Dowel  - 1 x daily - 7 x weekly - 3 sets - 10 reps - Supine Shoulder Flexion Overhead with Dowel  - 1 x daily - 7 x weekly - 3 sets - 10 reps - Supine Shoulder Press with Dowel  - 1 x daily - 7 x weekly - 3 sets - 10 reps - Supine Shoulder Abduction AAROM with Dowel  - 1 x daily - 7 x weekly - 3 sets - 10 reps - Supine Shoulder Flexion AAROM with Dowel  - 1 x daily - 7 x weekly - 3 sets - 10 reps  Access Code: [email or web address] URL: [email or web address] Date: 12/02/2023 Prepared by: Ozell Sero  Exercises - Supine Shoulder External Rotation AAROM with Dowel  - 1 x daily - 5 x weekly - 3 sets - 10 reps - Supine Shoulder Flexion Overhead with Dowel  - 1 x daily - 5 x weekly - 3 sets - 10 reps - Supine Shoulder Press with Dowel  - 1 x daily - 5 x weekly - 3 sets - 10  reps - Supine Shoulder Abduction AAROM with Dowel  - 1 x daily - 5 x weekly - 3 sets - 10 reps - Supine Shoulder Flexion AAROM with Dowel  - 1 x daily - 5 x weekly - 3 sets - 10 reps - Shoulder extension with resistance - Neutral  - 1 x daily - 5 x weekly - 3 sets - 10 reps - Standing Tricep Extensions with Resistance  - 1 x daily - 5 x weekly - 3 sets - 10 reps - Standing Single Arm Bicep Curls Supinated with Dumbbell  - 1 x daily - 5 x weekly - 3 sets - 10 reps  ASSESSMENT:  CLINICAL IMPRESSION:  PT assessed pts. LE muscle strength with generalized LE weakness (R LE weaker than L).  Moderate R lower leg pitting edema and tenderness with hand placement during MMT.  Pt. Presents with high fall risk without use of assistive device/ SBA.  Pt. completed L shoulder A/AROM and strengthening in pain tolerable range to progress L shoulder functional reaching/ ADLs.  Good sh. Control with static isometrics during sh. Flexion but limited with L shoulder abduction holds secondary to pain/ weakness.  Pt. Remains motivated to increase L shoulder motor control and improve LE strength/ balance with daily walking.  Pt. will remain compliant with HEP/ daily use of UE and SPC with gait.  See updated goals.   Pt. Will benefit from skilled PT services to increase L shoulder ROM/ strength to promote return to PLOF/ mobility with SPC.       OBJECTIVE IMPAIRMENTS: decreased activity tolerance, decreased endurance, decreased ROM, decreased strength, hypomobility, impaired flexibility, impaired sensation, improper body mechanics, postural dysfunction, and pain.   ACTIVITY LIMITATIONS: carrying, lifting, bathing, toileting, dressing, self feeding, reach over head, and hygiene/grooming  PARTICIPATION LIMITATIONS: meal prep, cleaning, laundry, and shopping  PERSONAL FACTORS: Fitness and Past/current experiences are also affecting patient's functional outcome.   REHAB POTENTIAL: Good  CLINICAL DECISION MAKING:  Stable/uncomplicated  EVALUATION COMPLEXITY: Moderate   GOALS: Goals reviewed with patient?  Yes  LONG TERM GOALS: Target date: 05/31/24  Pt. Able to complete HEP with no increase c/o L shoulder pain to improve L sh. Joint mobility/ stability.   Baseline: 5/12: minimal c/o L shoulder pain/ soreness Goal status: Partially met  2.  Pt. Will increase L shoulder AROM to >100 deg. Flexion to improve management of hair/ feeding.   Baseline: no L shoulder AROM at this time.   2/24:  pt. Challenged managing hair with compensatory movement patterns.  4/15: see above chart.  5/12: standing AAROM with isometric hold at 115 deg.  AROM <90 deg. Goal status: Not met  3.  Pt. Will increase LEFS to >45 out of 80 to improve safety with walking/ daily functional tasks.    Baseline: 30 out of 80. Goal status: Initial  4.  Pt. Will be able to dress/ groom independently with no shoulder pain/ limitations.    Baseline: limited with use of L shoulder during dressing.  5/12: limited with L shoulder IR behind back and bring L hand to head.    Goal status: Partially met  5.  Pt. Will increase Berg balance test to >45 out of 56 to decrease fall risk/ improve safety with walking/ prevent falls.   Baseline: 40 out of 56.   Goal status:  Initial   PLAN:  PT FREQUENCY: 1-2x/week  PT DURATION: 6 weeks  PLANNED INTERVENTIONS: 97110-Therapeutic exercises, 97530- Therapeutic activity, [id number]- Neuromuscular re-education, 97535- Self Care, [id number]- Manual therapy, Patient/Family education, Joint mobilization, DME instructions, Cryotherapy, and Moist heat  PLAN FOR NEXT SESSION:  Progress nautilus exercises to both seated and standing positions.  Issue LE strengthening ex./ balance tasks.   Ozell JAYSON Sero, PT, DPT # [phone] Physical Therapist - [facility] 04/19/2024, 5:36 PM

## 2024-04-21 ENCOUNTER — Ambulatory Visit: Attending: Orthopedic Surgery | Admitting: Physical Therapy

## 2024-04-21 ENCOUNTER — Encounter: Payer: Self-pay | Admitting: Physical Therapy

## 2024-04-21 DIAGNOSIS — M25612 Stiffness of left shoulder, not elsewhere classified: Secondary | ICD-10-CM | POA: Diagnosis present

## 2024-04-21 DIAGNOSIS — R2689 Other abnormalities of gait and mobility: Secondary | ICD-10-CM | POA: Insufficient documentation

## 2024-04-21 DIAGNOSIS — M25512 Pain in left shoulder: Secondary | ICD-10-CM | POA: Diagnosis present

## 2024-04-21 DIAGNOSIS — R269 Unspecified abnormalities of gait and mobility: Secondary | ICD-10-CM | POA: Insufficient documentation

## 2024-04-21 DIAGNOSIS — M6281 Muscle weakness (generalized): Secondary | ICD-10-CM | POA: Insufficient documentation

## 2024-04-21 NOTE — Therapy (Signed)
 OUTPATIENT PHYSICAL THERAPY SHOULDER and BALANCE TREATMENT  Patient Name: Evelyn Cisneros MRN: [id number] DOB:[birth year], 83 y.o., female Today's Date: 04/21/2024  END OF SESSION:  PT End of Session - 04/21/24 1104     Visit Number 32    Number of Visits 43    Date for PT Re-Evaluation 05/31/24    PT Start Time 1104    PT Stop Time 1152    PT Time Calculation (min) 48 min          Past Medical History:  Diagnosis Date   Anemia    Arthritis    Heart murmur    Hypertension    Hypothyroidism    Spondylolisthesis of lumbar region    Past Surgical History:  Procedure Laterality Date   ABDOMINAL HYSTERECTOMY     REPLACEMENT TOTAL KNEE BILATERAL     SPINE SURGERY     Patient Active Problem List   Diagnosis Date Noted   Spondylolisthesis of lumbar region 05/04/2018   Abnormal glucose level 02/12/2018   Abnormal weight gain 02/12/2018   Allergic rhinitis 02/12/2018   Asthma 02/12/2018   Bradycardia 02/12/2018   Edema 02/12/2018   Heart murmur 02/12/2018   Hypercalcemia 02/12/2018   Hypokalemia 02/12/2018   Pure hypercholesterolemia 02/12/2018   Shoulder joint pain 02/12/2018   Skin sensation disturbance 02/12/2018   Syncope 02/12/2018   Vitamin D  deficiency 02/12/2018   Central cervical cord injury, 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, 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) 04/24/2017   Muscle spasticity 04/24/2017   Weight loss 12/19/2016   Hypothyroidism 11/24/2016   Bilateral leg edema 11/24/2016   Cervical radiculopathy 11/24/2016   Disc degeneration, lumbar 11/24/2016   Essential hypertension 11/24/2016   Gallstones 11/24/2016   History of anxiety state 11/24/2016   Hyperlipidemia, unspecified 11/24/2016   Spinal stenosis of lumbar region with neurogenic claudication 11/08/2016   Anxiety 06/08/2015   PCP: Ashley Boby LABOR, MD  REFERRING PROVIDER: Teresa Beryl CROME, PA-C  REFERRING DIAG: [phone] (ICD-10-CM) - Anterior dislocation of left humerus   THERAPY DIAG:  Post-traumatic stiffness of  left shoulder joint  Acute pain of left shoulder  Muscle weakness (generalized)  Gait difficulty  Balance disorder  Rationale for Evaluation and Treatment: Rehabilitation  ONSET DATE: 10/20/23  SUBJECTIVE:                                                                                                                                                                                      SUBJECTIVE STATEMENT: Pt. Reports falling while pushing shopping cart in parking lot at [place].  Pt. Hit head resulting in lacerations/ stitches and L anterior shoulder dislocation.  Pt. Reports 0/10 L shoulder pain at rest  and 2/10 pain with eating/brushing teeth.  Pt. Was using SPC on L prior to fall but limited due to shoulder sling.  Pt. Entered PT in w/c today due to inability to use L UE at this time.  Hand dominance: Left  PERTINENT HISTORY: Subjective   Evelyn Cisneros is a 83 y.o. female in today for: HPI: History of Present Illness  Patient here today for suture removal. She underwent lac repair of her forehead after a fall on concrete 12/30. Repaired 2 lacs with 2 interrupted sutures and 6 running locked sutures.   Today, patient reports healing well. She has had a nodule come up on her left wrist. It is mildly tender to palpation, does not bother her otherwise.   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 90 tablet 3  atorvastatin  (LIPITOR) 40 MG tablet TAKE 1 TABLET(40 MG) BY MOUTH EVERY DAY 90 tablet 3  biotin 5 mg Tab Take by mouth  busPIRone (BUSPAR) 5 MG tablet Take 1 tablet (5 mg total) by mouth 2 (two) times daily as needed 30 tablet 5  calcium  lactate 100 mg calcium  Tab Take by mouth  cholecalciferol , vitamin D3, (VITAMIN D3) 125 mcg (5,000 unit) Tab Take 1 tablet by mouth every morning 90 tablet 3  co-enzyme Q-10, ubiquinone, 200 mg capsule Take 200 mg by mouth every morning.   cyanocobalamin (VITAMIN B12) 1,000 mcg SL tablet Take by mouth  ferrous sulfate  325 (65 FE) MG tablet Take 1 tablet (325 mg total) by mouth twice a week 90 tablet 3  FUROsemide  (LASIX ) 20 MG tablet Take 1-2 tablets (20-40 mg total) by mouth once daily as needed for Edema 60 tablet 4  gabapentin  (NEURONTIN ) 300 MG capsule TAKE ONE CAPSULE BY MOUTH THREE TIMES DAILY TO FOUR TIMES DAILY AS NEEDED FOR PAIN 360 capsule 3  Lactobacillus acidophilus (PROBIOTIC ORAL) Take by mouth once daily  levothyroxine  (SYNTHROID ) 50 MCG tablet Take 1.5 tablets (75 mcg total) by mouth once daily 135 tablet 3  lidocaine  (LIDODERM ) 5 % patch as needed  losartan  (COZAAR ) 100 MG tablet TAKE 1 TABLET(100 MG) BY MOUTH EVERY DAY 90 tablet 3  MAGNESIUM ORAL Take by mouth  VIT C/VIT E AC/LUT/COPPER/ZINC  (PRESERVISION LUTEIN ORAL) Take 1 tablet by mouth every morning. Last dose one week prior to procedure  ZINC  ORAL Take 50 mg by mouth once daily   No facility-administered medications prior to visit.   Objective   Vitals:  10/30/23 1350  PainSc: 0-No pain   There is no height or weight on file to calculate BMI. Home Vitals:  Physical Exam Constitutional:  General: She is not in acute distress. Appearance: Normal appearance.  HENT:  Head: Normocephalic and atraumatic.  Eyes:  General: No scleral icterus. Extraocular Movements: Extraocular movements intact.   Conjunctiva/sclera: Conjunctivae normal.  Cardiovascular:  Rate and Rhythm: Normal rate and regular rhythm.  Pulmonary:  Effort:  Pulmonary effort is normal. No respiratory distress.  Breath sounds: Normal breath sounds.  Musculoskeletal:  General: Normal range of motion.  Cervical back: Normal range of motion and neck supple.  Comments: Head lacerations with good interval healing. All sutures visualized for removal.   Left hand with significant bruising from PIPs to upper wrist, interval resolution present. There is a cystic lesion, nonmobile, mildly tender to manipulation present in her posterior central wrist.  Skin: General: Skin is warm and dry.  Coloration: Skin is not jaundiced.  Findings: No erythema or rash.  Neurological:  General: No focal deficit present.  Mental Status: She is alert.  Cranial Nerves: No cranial nerve deficit.  Coordination: Coordination normal.   Assessment & Plan  Diagnoses and all orders for this visit:  Visit for suture removal - Running central scalp suture and 2x interrupted suture of right scalp removed without difficulty. Area clean with good interval healing to wound.  - Recommend gentle washing with soap and water. Avoid aggressive scrubbing. Keep area clean and dry. Reviewed warning s/s for wound healing or new onset infection requiring further evaluation.  Wrist lesion - Possible ganglion cyst vs healing blister pocket, difficult to evaluate fully with overall bruising and injury from her recent fall. Reviewed possible diagnoses. If pain, worsening swelling, numbness/tingling or movement limitations recommend earlier evaluation. Otherwise recommend evaluation by orthopedics at her follow up if symptoms do not resolve with the rest of her injuries.   PAIN:  Are you having pain? Yes: NPRS scale: 2/10 Pain location: L shoulder Pain description: sharp/aching Aggravating factors: movement/ brushing teeth Relieving factors: rest/ use of  sling  PRECAUTIONS: Shoulder  RED FLAGS: None   WEIGHT BEARING RESTRICTIONS: No  FALLS:  Has patient fallen in last 6 months? Yes. Number of falls 2  LIVING ENVIRONMENT: Lives with: lives with their spouse Lives in: House/apartment Has following equipment at home: Single point cane and Wheelchair (manual)  OCCUPATION: Retired  PLOF: Independent with household mobility with device  PATIENT GOALS:  Increase L shoulder AROM/ strength/ pain-free mobility.    NEXT MD VISIT: 11/14/23 with Dr. Gini (ortho MD)  OBJECTIVE:  Note: Objective measures were completed at Evaluation unless otherwise noted.  DIAGNOSTIC FINDINGS:  See imaging  PATIENT SURVEYS:  FOTO initial 32/ goal 13   COGNITION: Overall cognitive status: Within functional limits for tasks assessed     SENSATION: WFL.  Moderate L hand bruising.    POSTURE: Rounded shoulders/ forward posture.  Use of L shoulder sling.    UPPER EXTREMITY ROM:  Full tear of R supraspinatus reported.    Passive ROM Right Eval AROM Left Eval PROM  Shoulder flexion 74 deg. 53 deg.  Shoulder extension    Shoulder abduction 70 deg. 42 deg.  Shoulder adduction    Shoulder internal rotation  [facility]  Shoulder external rotation  0 deg.  Elbow flexion [facility] WFL  Elbow extension [facility] WFL  Wrist flexion    Wrist extension    Wrist ulnar deviation    Wrist radial deviation    Wrist pronation    Wrist supination    (Blank rows = not tested)  UPPER EXTREMITY MMT: No MMT testing today.    SHOULDER SPECIAL TESTS: NT  JOINT MOBILITY TESTING:  No L shoulder joint mobs. Secondary to anterior dislocation.    PALPATION:  Generalized L hand tenderness/ bruising.    4/15:  Passive ROM Right seated AROM Left seated PROM  Shoulder flexion 82 deg. 95 deg.  Shoulder  extension    Shoulder abduction 74 deg. 88 deg.  Shoulder adduction    Shoulder internal rotation [facility]  Shoulder external rotation 30 deg. 0 deg.  Elbow flexion  [facility] WFL  Elbow extension [facility]  Wrist flexion    Wrist extension    Wrist ulnar deviation    Wrist radial deviation    Wrist pronation    Wrist supination    (Blank rows = not tested)    Passive ROM Right seated AROM Left seated P/AROM  Shoulder flexion 84 deg. 128/48 deg.  Shoulder extension    Shoulder abduction 78 deg. 124/88 deg.  Shoulder adduction    Shoulder internal rotation [facility]  Shoulder external rotation 30 deg. 8 deg.  Elbow flexion [facility] WFL  Elbow extension [facility] WFL  Wrist flexion    Wrist extension    Wrist ulnar deviation    Wrist radial deviation    Wrist pronation    Wrist supination    (Blank rows = not tested)    Gait assessment:  R antalgic gait with use of SPC.  Limited hip/knee flexion and step length with step to gait pattern.  No arm swing, esp. On R.  1 episode of shuffling R foot on floor resulting in a self-corrected balance check.    B LE MMT: R/L hip flexion (3/4), abduction (4/4), knee extension (4/4+), knee flexion (4+/4+).  Moderate R lower lower leg pitting edema (discussed benefits of compression stockings).    5xSTS:  19.0 sec./ 15.9 sec.   Berg:  40/56 (fall risk).    LEFS:  30 out of 80.                                                                           TREATMENT DATE: 04/21/2024  Subjective: Pt. Entered PT with use of SPC and step to gait.  No reports of falls since last PT tx. Session.  Pt. Continues to use L UE with functional reaching for ADLs.      There.act.:  Seated shoulder pulley: flexion/ scaption/ abduction (warm-up)- 10x each.    Walking in //-bars: forward/lateral/backwards with no UE assist.  SBA/CGA for safety and verbal cuing to maintain BOS/ step pattern.    Walking alt. UE/LE touches in //-bars.  Requires 1 UE assist for balance/ safety.  Walking in clinic with focus on recip. Step pattern while using SPC.  Pt. Has limited consistent step length.  No LOB but extra time for safety.    Nautilus:  AAROM/min. Assist with all movements (wand).     Seated lat. pull down 30# 20x2.    Standing scap. Retraction 30# 20x2.   Seated L shoulder manual isometrics (flexion/abduction) 5x each with min. To mod. Resistance.  Pain limited with abduction/ limited hold time.   Seated L shoulder PROM (flexion/ abduction/ ER)- 5x each.  Encouraged active holds at tolerable passive end-range.    Seated STM to L and R shoulder and UT musculature during sh. ROM reassessment.  Moderate L shoulder (posterior aspect)/ bicep tenderness with STM.     PATIENT EDUCATION: Education details: Reviewed HEP Person educated: Patient and Spouse Education method: Medical illustrator Education comprehension: verbalized understanding and returned demonstration  HOME EXERCISE PROGRAM: Access Code: [email or web address] URL: [email or web address] Date: 11/06/2023 Prepared by: Ozell Sero  Exercises - Seated Scapular Retraction  - 1 x daily - 7 x weekly - 2 sets - 10 reps - Standing Isometric Shoulder External Rotation with Doorway  - 1 x daily - 7 x weekly - 2 sets - 10 reps - 5-10sec hold - Isometric Shoulder Abduction at Wall  - 1 x daily - 7 x weekly - 2 sets - 10 reps - 5-10 sec hold - Isometric Shoulder Flexion at Wall  - 1 x daily - 7 x weekly - 2 sets - 10 reps - 5-10 sec hold   Access Code: [email or web address] URL: [email or web address] Date: 11/18/2023 Prepared by: Ozell Sero  Exercises - Seated Scapular Retraction  - 1 x daily - 7 x weekly - 2 sets - 10 reps - Supine Shoulder External Rotation AAROM with Dowel  - 1 x daily - 7 x weekly - 3 sets - 10 reps - Supine Shoulder Flexion Overhead with Dowel  - 1 x daily - 7 x weekly - 3 sets - 10 reps - Supine Shoulder Press with Dowel  - 1 x daily - 7 x weekly - 3 sets - 10 reps - Supine Shoulder Abduction AAROM with Dowel  - 1 x daily - 7 x weekly - 3 sets - 10 reps - Supine Shoulder Flexion AAROM with Dowel  - 1 x daily - 7 x weekly - 3  sets - 10 reps  Access Code: [email or web address] URL: [email or web address] Date: 12/02/2023 Prepared by: Ozell Sero  Exercises - Supine Shoulder External Rotation AAROM with Dowel  - 1 x daily - 5 x weekly - 3 sets - 10 reps - Supine Shoulder Flexion Overhead with Dowel  - 1 x daily - 5 x weekly - 3 sets - 10 reps - Supine Shoulder Press with Dowel  - 1 x daily - 5 x weekly - 3 sets - 10 reps - Supine Shoulder Abduction AAROM with Dowel  - 1 x daily - 5 x weekly - 3 sets - 10 reps - Supine Shoulder Flexion AAROM with Dowel  - 1 x daily - 5 x weekly - 3 sets - 10 reps - Shoulder extension with resistance - Neutral  - 1 x daily - 5 x weekly - 3 sets - 10 reps - Standing Tricep Extensions with Resistance  - 1 x daily - 5 x weekly - 3 sets - 10 reps - Standing Single Arm Bicep Curls Supinated with Dumbbell  - 1 x daily - 5 x weekly - 3 sets - 10 reps  ASSESSMENT:  CLINICAL IMPRESSION:  PT tx. Focused on gait/balance tasks and progressing L shoulder A/AROM and strengthening in pain tolerable range to progress functional reaching/ ADLs.  Good sh. Control with static isometrics during sh. Flexion but limited with L shoulder abduction holds secondary to pain/ weakness.  Pt. Remains motivated to increase L shoulder motor control and improve LE strength/ balance with daily walking.  Pt. will remain compliant with HEP/ daily use of UE and SPC with gait.  Pt. Requires CGA/cuing for consistent recip. Gait pattern.   See updated goals.   Pt. Will benefit from skilled PT services to increase L shoulder ROM/ strength to promote return to PLOF/ mobility with SPC.       OBJECTIVE IMPAIRMENTS: decreased activity tolerance, decreased endurance, decreased ROM, decreased strength, hypomobility, impaired flexibility, impaired sensation, improper body mechanics, postural dysfunction, and pain.   ACTIVITY LIMITATIONS: carrying,  lifting, bathing, toileting, dressing, self feeding, reach over head, and  hygiene/grooming  PARTICIPATION LIMITATIONS: meal prep, cleaning, laundry, and shopping  PERSONAL FACTORS: Fitness and Past/current experiences are also affecting patient's functional outcome.   REHAB POTENTIAL: Good  CLINICAL DECISION MAKING: Stable/uncomplicated  EVALUATION COMPLEXITY: Moderate   GOALS: Goals reviewed with patient? Yes  LONG TERM GOALS: Target date: 05/31/24  Pt. Able to complete HEP with no increase c/o L shoulder pain to improve L sh. Joint mobility/ stability.   Baseline: 5/12: minimal c/o L shoulder pain/ soreness Goal status: Partially met  2.  Pt. Will increase L shoulder AROM to >100 deg. Flexion to improve management of hair/ feeding.   Baseline: no L shoulder AROM at this time.   2/24:  pt. Challenged managing hair with compensatory movement patterns.  4/15: see above chart.  5/12: standing AAROM with isometric hold at 115 deg.  AROM <90 deg. Goal status: Not met  3.  Pt. Will increase LEFS to >45 out of 80 to improve safety with walking/ daily functional tasks.    Baseline: 30 out of 80. Goal status: Initial  4.  Pt. Will be able to dress/ groom independently with no shoulder pain/ limitations.    Baseline: limited with use of L shoulder during dressing.  5/12: limited with L shoulder IR behind back and bring L hand to head.    Goal status: Partially met  5.  Pt. Will increase Berg balance test to >45 out of 56 to decrease fall risk/ improve safety with walking/ prevent falls.   Baseline: 40 out of 56.   Goal status:  Initial   PLAN:  PT FREQUENCY: 1-2x/week  PT DURATION: 6 weeks  PLANNED INTERVENTIONS: 97110-Therapeutic exercises, 97530- Therapeutic activity, [id number]- Neuromuscular re-education, 97535- Self Care, [id number]- Manual therapy, Patient/Family education, Joint mobilization, DME instructions, Cryotherapy, and Moist heat  PLAN FOR NEXT SESSION:  Progress nautilus exercises to both seated and standing positions.  Issue LE strengthening ex./  balance tasks.   Ozell JAYSON Sero, PT, DPT # [phone] Physical Therapist - [facility] 04/21/2024, 7:37 PM

## 2024-04-26 ENCOUNTER — Ambulatory Visit: Admitting: Physical Therapy

## 2024-05-11 ENCOUNTER — Encounter: Payer: Self-pay | Admitting: Physical Therapy

## 2024-05-11 ENCOUNTER — Ambulatory Visit: Admitting: Physical Therapy

## 2024-05-11 DIAGNOSIS — M25512 Pain in left shoulder: Secondary | ICD-10-CM

## 2024-05-11 DIAGNOSIS — R2689 Other abnormalities of gait and mobility: Secondary | ICD-10-CM

## 2024-05-11 DIAGNOSIS — R269 Unspecified abnormalities of gait and mobility: Secondary | ICD-10-CM

## 2024-05-11 DIAGNOSIS — M6281 Muscle weakness (generalized): Secondary | ICD-10-CM

## 2024-05-11 DIAGNOSIS — M25612 Stiffness of left shoulder, not elsewhere classified: Secondary | ICD-10-CM | POA: Diagnosis not present

## 2024-05-11 NOTE — Therapy (Signed)
 OUTPATIENT PHYSICAL THERAPY SHOULDER and BALANCE TREATMENT  Patient Name: Evelyn Cisneros MRN: [id number] DOB:Sep 12, 1941, 83 y.o., female Today's Date: 05/12/2024  END OF SESSION:  PT End of Session - 05/11/24 0930     Visit Number 33    Number of Visits 43    Date for PT Re-Evaluation 05/31/24    PT Start Time 0943    PT Stop Time 1032    PT Time Calculation (min) 49 min          Past Medical History:  Diagnosis Date   Anemia    Arthritis    Heart murmur    Hypertension    Hypothyroidism    Spondylolisthesis of lumbar region    Past Surgical History:  Procedure Laterality Date   ABDOMINAL HYSTERECTOMY     REPLACEMENT TOTAL KNEE BILATERAL     SPINE SURGERY     Patient Active Problem List   Diagnosis Date Noted   Spondylolisthesis of lumbar region 05/04/2018   Abnormal glucose level 02/12/2018   Abnormal weight gain 02/12/2018   Allergic rhinitis 02/12/2018   Asthma 02/12/2018   Bradycardia 02/12/2018   Edema 02/12/2018   Heart murmur 02/12/2018   Hypercalcemia 02/12/2018   Hypokalemia 02/12/2018   Pure hypercholesterolemia 02/12/2018   Shoulder joint pain 02/12/2018   Skin sensation disturbance 02/12/2018   Syncope 02/12/2018   Vitamin D  deficiency 02/12/2018   Central cervical cord injury, 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, 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) 04/24/2017   Muscle spasticity 04/24/2017   Weight loss 12/19/2016   Hypothyroidism 11/24/2016   Bilateral leg edema 11/24/2016   Cervical radiculopathy 11/24/2016   Disc degeneration, lumbar 11/24/2016   Essential hypertension 11/24/2016   Gallstones 11/24/2016   History of anxiety state 11/24/2016   Hyperlipidemia, unspecified 11/24/2016   Spinal stenosis of lumbar region with neurogenic claudication 11/08/2016   Anxiety 06/08/2015   PCP: Ashley Boby LABOR, MD  REFERRING PROVIDER: Teresa Beryl CROME, PA-C  REFERRING DIAG: [phone] (ICD-10-CM) - Anterior dislocation of left humerus   THERAPY DIAG:  Post-traumatic stiffness of  left shoulder joint  Acute pain of left shoulder  Muscle weakness (generalized)  Gait difficulty  Balance disorder  Rationale for Evaluation and Treatment: Rehabilitation  ONSET DATE: 10/20/23  SUBJECTIVE:                                                                                                                                                                                      SUBJECTIVE STATEMENT: Pt. Reports falling while pushing shopping cart in parking lot at [employer].  Pt. Hit head resulting in lacerations/ stitches and L anterior shoulder dislocation.  Pt. Reports 0/10 L shoulder pain at rest  and 2/10 pain with eating/brushing teeth.  Pt. Was using SPC on L prior to fall but limited due to shoulder sling.  Pt. Entered PT in w/c today due to inability to use L UE at this time.  Hand dominance: Left  PERTINENT HISTORY: Subjective   Evelyn Cisneros is a 83 y.o. female in today for: HPI: History of Present Illness  Patient here today for suture removal. She underwent lac repair of her forehead after a fall on concrete 12/30. Repaired 2 lacs with 2 interrupted sutures and 6 running locked sutures.   Today, patient reports healing well. She has had a nodule come up on her left wrist. It is mildly tender to palpation, does not bother her otherwise.   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 90 tablet 3  atorvastatin  (LIPITOR) 40 MG tablet TAKE 1 TABLET(40 MG) BY MOUTH EVERY DAY 90 tablet 3  biotin 5 mg Tab Take by mouth  busPIRone (BUSPAR) 5 MG tablet Take 1 tablet (5 mg total) by mouth 2 (two) times daily as needed 30 tablet 5  calcium  lactate 100 mg calcium  Tab Take by mouth  cholecalciferol , vitamin D3, (VITAMIN D3) 125 mcg (5,000 unit) Tab Take 1 tablet by mouth every morning 90 tablet 3  co-enzyme Q-10, ubiquinone, 200 mg capsule Take 200 mg by mouth every morning.   cyanocobalamin (VITAMIN B12) 1,000 mcg SL tablet Take by mouth  ferrous sulfate  325 (65 FE) MG tablet Take 1 tablet (325 mg total) by mouth twice a week 90 tablet 3  FUROsemide  (LASIX ) 20 MG tablet Take 1-2 tablets (20-40 mg total) by mouth once daily as needed for Edema 60 tablet 4  gabapentin  (NEURONTIN ) 300 MG capsule TAKE ONE CAPSULE BY MOUTH THREE TIMES DAILY TO FOUR TIMES DAILY AS NEEDED FOR PAIN 360 capsule 3  Lactobacillus acidophilus (PROBIOTIC ORAL) Take by mouth once daily  levothyroxine  (SYNTHROID ) 50 MCG tablet Take 1.5 tablets (75 mcg total) by mouth once daily 135 tablet 3  lidocaine  (LIDODERM ) 5 % patch as needed  losartan  (COZAAR ) 100 MG tablet TAKE 1 TABLET(100 MG) BY MOUTH EVERY DAY 90 tablet 3  MAGNESIUM ORAL Take by mouth  VIT C/VIT E AC/LUT/COPPER/ZINC  (PRESERVISION LUTEIN ORAL) Take 1 tablet by mouth every morning. Last dose one week prior to procedure  ZINC  ORAL Take 50 mg by mouth once daily   No facility-administered medications prior to visit.   Objective   Vitals:  10/30/23 1350  PainSc: 0-No pain   There is no height or weight on file to calculate BMI. Home Vitals:  Physical Exam Constitutional:  General: She is not in acute distress. Appearance: Normal appearance.  HENT:  Head: Normocephalic and atraumatic.  Eyes:  General: No scleral icterus. Extraocular Movements: Extraocular movements intact.   Conjunctiva/sclera: Conjunctivae normal.  Cardiovascular:  Rate and Rhythm: Normal rate and regular rhythm.  Pulmonary:  Effort:  Pulmonary effort is normal. No respiratory distress.  Breath sounds: Normal breath sounds.  Musculoskeletal:  General: Normal range of motion.  Cervical back: Normal range of motion and neck supple.  Comments: Head lacerations with good interval healing. All sutures visualized for removal.   Left hand with significant bruising from PIPs to upper wrist, interval resolution present. There is a cystic lesion, nonmobile, mildly tender to manipulation present in her posterior central wrist.  Skin: General: Skin is warm and dry.  Coloration: Skin is not jaundiced.  Findings: No erythema or rash.  Neurological:  General: No focal deficit present.  Mental Status: She is alert.  Cranial Nerves: No cranial nerve deficit.  Coordination: Coordination normal.   Assessment & Plan  Diagnoses and all orders for this visit:  Visit for suture removal - Running central scalp suture and 2x interrupted suture of right scalp removed without difficulty. Area clean with good interval healing to wound.  - Recommend gentle washing with soap and water. Avoid aggressive scrubbing. Keep area clean and dry. Reviewed warning s/s for wound healing or new onset infection requiring further evaluation.  Wrist lesion - Possible ganglion cyst vs healing blister pocket, difficult to evaluate fully with overall bruising and injury from her recent fall. Reviewed possible diagnoses. If pain, worsening swelling, numbness/tingling or movement limitations recommend earlier evaluation. Otherwise recommend evaluation by orthopedics at her follow up if symptoms do not resolve with the rest of her injuries.   PAIN:  Are you having pain? Yes: NPRS scale: 2/10 Pain location: L shoulder Pain description: sharp/aching Aggravating factors: movement/ brushing teeth Relieving factors: rest/ use of  sling  PRECAUTIONS: Shoulder  RED FLAGS: None   WEIGHT BEARING RESTRICTIONS: No  FALLS:  Has patient fallen in last 6 months? Yes. Number of falls 2  LIVING ENVIRONMENT: Lives with: lives with their spouse Lives in: House/apartment Has following equipment at home: Single point cane and Wheelchair (manual)  OCCUPATION: Retired  PLOF: Independent with household mobility with device  PATIENT GOALS:  Increase L shoulder AROM/ strength/ pain-free mobility.    NEXT MD VISIT: 11/14/23 with Dr. Gini (ortho MD)  OBJECTIVE:  Note: Objective measures were completed at Evaluation unless otherwise noted.  DIAGNOSTIC FINDINGS:  See imaging  PATIENT SURVEYS:  FOTO initial 32/ goal 56   COGNITION: Overall cognitive status: Within functional limits for tasks assessed     SENSATION: WFL.  Moderate L hand bruising.    POSTURE: Rounded shoulders/ forward posture.  Use of L shoulder sling.    UPPER EXTREMITY ROM:  Full tear of R supraspinatus reported.    Passive ROM Right Eval AROM Left Eval PROM  Shoulder flexion 74 deg. 53 deg.  Shoulder extension    Shoulder abduction 70 deg. 42 deg.  Shoulder adduction    Shoulder internal rotation  [facility]  Shoulder external rotation  0 deg.  Elbow flexion [facility] WFL  Elbow extension [facility] WFL  Wrist flexion    Wrist extension    Wrist ulnar deviation    Wrist radial deviation    Wrist pronation    Wrist supination    (Blank rows = not tested)  UPPER EXTREMITY MMT: No MMT testing today.    SHOULDER SPECIAL TESTS: NT  JOINT MOBILITY TESTING:  No L shoulder joint mobs. Secondary to anterior dislocation.    PALPATION:  Generalized L hand tenderness/ bruising.    4/15:  Passive ROM Right seated AROM Left seated PROM  Shoulder flexion 82 deg. 95 deg.  Shoulder  extension    Shoulder abduction 74 deg. 88 deg.  Shoulder adduction    Shoulder internal rotation [facility]  Shoulder external rotation 30 deg. 0 deg.  Elbow flexion  [facility] WFL  Elbow extension [facility]  Wrist flexion    Wrist extension    Wrist ulnar deviation    Wrist radial deviation    Wrist pronation    Wrist supination    (Blank rows = not tested)    Passive ROM Right seated AROM Left seated P/AROM  Shoulder flexion 84 deg. 128/48 deg.  Shoulder extension    Shoulder abduction 78 deg. 124/88 deg.  Shoulder adduction    Shoulder internal rotation [facility]  Shoulder external rotation 30 deg. 8 deg.  Elbow flexion [facility] WFL  Elbow extension [facility] WFL  Wrist flexion    Wrist extension    Wrist ulnar deviation    Wrist radial deviation    Wrist pronation    Wrist supination    (Blank rows = not tested)    Gait assessment:  R antalgic gait with use of SPC.  Limited hip/knee flexion and step length with step to gait pattern.  No arm swing, esp. On R.  1 episode of shuffling R foot on floor resulting in a self-corrected balance check.    B LE MMT: R/L hip flexion (3/4), abduction (4/4), knee extension (4/4+), knee flexion (4+/4+).  Moderate R lower lower leg pitting edema (discussed benefits of compression stockings).    5xSTS:  19.0 sec./ 15.9 sec.   Berg:  40/56 (fall risk).    LEFS:  30 out of 80.                                                                           TREATMENT DATE: 05/12/2024  Subjective: Pt. Entered PT with use of SPC and step to gait on R.  No reports of falls since last PT tx. Session.  Pt. Continues to use L UE with functional reaching for ADLs and remains busy with daily tasks at home.      There.act.:  Seated shoulder pulley: flexion/ scaption/ abduction (warm-up)- 10x each.    Seated wand ex. (Chest press/ shoulder abduction/ shoulder flexion)- 5x each with PT assist/cuing.    Sit to stands from chair 10x with no UE assist.  Good wt. Shifting/ B LE wt. Bearing.    Nautilus: AAROM/min. Assist with all movements (wand).     Standing lat. pull down 30# 35x. Standing tricep extension 20# 30x.   Standing  scap. Retraction 30# 20x2.   Seated 3# bicep curls on L/R  Seated L shoulder manual isometrics (flexion/abduction) 5x each with min. To mod. Resistance.  Pain limited with abduction/ limited hold time.   Seated L shoulder PROM (flexion/ abduction/ ER)- 5x each.  Encouraged active holds at tolerable passive end-range.    No STM today.     PATIENT EDUCATION: Education details: Reviewed HEP Person educated: Patient and Spouse Education method: Medical illustrator Education comprehension: verbalized understanding and returned demonstration  HOME EXERCISE PROGRAM: Access Code: [email or web address] URL: [email or web address] Date: 11/06/2023 Prepared by: Ozell Sero  Exercises - Seated Scapular Retraction  - 1 x daily - 7 x weekly -  2 sets - 10 reps - Standing Isometric Shoulder External Rotation with Doorway  - 1 x daily - 7 x weekly - 2 sets - 10 reps - 5-10sec hold - Isometric Shoulder Abduction at Wall  - 1 x daily - 7 x weekly - 2 sets - 10 reps - 5-10 sec hold - Isometric Shoulder Flexion at Wall  - 1 x daily - 7 x weekly - 2 sets - 10 reps - 5-10 sec hold   Access Code: [email or web address] URL: [email or web address] Date: 11/18/2023 Prepared by: Ozell Sero  Exercises - Seated Scapular Retraction  - 1 x daily - 7 x weekly - 2 sets - 10 reps - Supine Shoulder External Rotation AAROM with Dowel  - 1 x daily - 7 x weekly - 3 sets - 10 reps - Supine Shoulder Flexion Overhead with Dowel  - 1 x daily - 7 x weekly - 3 sets - 10 reps - Supine Shoulder Press with Dowel  - 1 x daily - 7 x weekly - 3 sets - 10 reps - Supine Shoulder Abduction AAROM with Dowel  - 1 x daily - 7 x weekly - 3 sets - 10 reps - Supine Shoulder Flexion AAROM with Dowel  - 1 x daily - 7 x weekly - 3 sets - 10 reps  Access Code: [email or web address] URL: [email or web address] Date: 12/02/2023 Prepared by: Ozell Sero  Exercises - Supine Shoulder External Rotation AAROM with Dowel   - 1 x daily - 5 x weekly - 3 sets - 10 reps - Supine Shoulder Flexion Overhead with Dowel  - 1 x daily - 5 x weekly - 3 sets - 10 reps - Supine Shoulder Press with Dowel  - 1 x daily - 5 x weekly - 3 sets - 10 reps - Supine Shoulder Abduction AAROM with Dowel  - 1 x daily - 5 x weekly - 3 sets - 10 reps - Supine Shoulder Flexion AAROM with Dowel  - 1 x daily - 5 x weekly - 3 sets - 10 reps - Shoulder extension with resistance - Neutral  - 1 x daily - 5 x weekly - 3 sets - 10 reps - Standing Tricep Extensions with Resistance  - 1 x daily - 5 x weekly - 3 sets - 10 reps - Standing Single Arm Bicep Curls Supinated with Dumbbell  - 1 x daily - 5 x weekly - 3 sets - 10 reps  ASSESSMENT:  CLINICAL IMPRESSION:  PT tx. Focused on progressing L shoulder A/AROM and strengthening in pain tolerable range to progress functional reaching/ ADLs.  Pts. Balance challenged with standing UE resisted ex. But able to complete with extra time/ SBA.   Good sh. Control with static isometrics during sh. Flexion but limited with L shoulder abduction holds secondary to pain/ weakness.  Pt. Remains motivated to increase L shoulder motor control and improve LE strength/ balance with daily walking.  Pt. will remain compliant with HEP/ daily use of UE and SPC with gait.  Pt. Requires CGA/cuing for consistent recip. Gait pattern.   No change to HEP.  Pt. Will benefit from skilled PT services to increase L shoulder ROM/ strength to promote return to PLOF/ mobility with SPC.       OBJECTIVE IMPAIRMENTS: decreased activity tolerance, decreased endurance, decreased ROM, decreased strength, hypomobility, impaired flexibility, impaired sensation, improper body mechanics, postural dysfunction, and pain.   ACTIVITY LIMITATIONS: carrying, lifting, bathing, toileting, dressing, self feeding, reach over head, and hygiene/grooming  PARTICIPATION LIMITATIONS: meal  prep, cleaning, laundry, and shopping  PERSONAL FACTORS: Fitness and  Past/current experiences are also affecting patient's functional outcome.   REHAB POTENTIAL: Good  CLINICAL DECISION MAKING: Stable/uncomplicated  EVALUATION COMPLEXITY: Moderate   GOALS: Goals reviewed with patient? Yes  LONG TERM GOALS: Target date: 05/31/24  Pt. Able to complete HEP with no increase c/o L shoulder pain to improve L sh. Joint mobility/ stability.   Baseline: 5/12: minimal c/o L shoulder pain/ soreness Goal status: Partially met  2.  Pt. Will increase L shoulder AROM to >100 deg. Flexion to improve management of hair/ feeding.   Baseline: no L shoulder AROM at this time.   2/24:  pt. Challenged managing hair with compensatory movement patterns.  4/15: see above chart.  5/12: standing AAROM with isometric hold at 115 deg.  AROM <90 deg. Goal status: Not met  3.  Pt. Will increase LEFS to >45 out of 80 to improve safety with walking/ daily functional tasks.    Baseline: 30 out of 80. Goal status: Initial  4.  Pt. Will be able to dress/ groom independently with no shoulder pain/ limitations.    Baseline: limited with use of L shoulder during dressing.  5/12: limited with L shoulder IR behind back and bring L hand to head.    Goal status: Partially met  5.  Pt. Will increase Berg balance test to >45 out of 56 to decrease fall risk/ improve safety with walking/ prevent falls.   Baseline: 40 out of 56.   Goal status:  Initial   PLAN:  PT FREQUENCY: 1-2x/week  PT DURATION: 6 weeks  PLANNED INTERVENTIONS: 97110-Therapeutic exercises, 97530- Therapeutic activity, [id number]- Neuromuscular re-education, 97535- Self Care, [id number]- Manual therapy, Patient/Family education, Joint mobilization, DME instructions, Cryotherapy, and Moist heat  PLAN FOR NEXT SESSION:  Progress nautilus exercises in standing positions.  Issue LE strengthening ex./ balance tasks.   Ozell JAYSON Sero, PT, DPT # [phone] Physical Therapist - [facility] 05/12/2024, 7:35  AM

## 2024-05-13 ENCOUNTER — Ambulatory Visit: Admitting: Physical Therapy

## 2024-05-13 ENCOUNTER — Encounter: Payer: Self-pay | Admitting: Physical Therapy

## 2024-05-13 DIAGNOSIS — R2689 Other abnormalities of gait and mobility: Secondary | ICD-10-CM

## 2024-05-13 DIAGNOSIS — M25612 Stiffness of left shoulder, not elsewhere classified: Secondary | ICD-10-CM | POA: Diagnosis not present

## 2024-05-13 DIAGNOSIS — R269 Unspecified abnormalities of gait and mobility: Secondary | ICD-10-CM

## 2024-05-13 DIAGNOSIS — M25512 Pain in left shoulder: Secondary | ICD-10-CM

## 2024-05-13 DIAGNOSIS — M6281 Muscle weakness (generalized): Secondary | ICD-10-CM

## 2024-05-13 NOTE — Therapy (Signed)
 OUTPATIENT PHYSICAL THERAPY SHOULDER and BALANCE TREATMENT  Patient Name: Evelyn Cisneros MRN: [id number] DOB:1940-12-11, 83 y.o., female Today's Date: 05/13/2024  END OF SESSION:  PT End of Session - 05/13/24 0933     Visit Number 34    Number of Visits 43    Date for PT Re-Evaluation 05/31/24         0939 to 1031  (52 minutes)  Past Medical History:  Diagnosis Date   Anemia    Arthritis    Heart murmur    Hypertension    Hypothyroidism    Spondylolisthesis of lumbar region    Past Surgical History:  Procedure Laterality Date   ABDOMINAL HYSTERECTOMY     REPLACEMENT TOTAL KNEE BILATERAL     SPINE SURGERY     Patient Active Problem List   Diagnosis Date Noted   Spondylolisthesis of lumbar region 05/04/2018   Abnormal glucose level 02/12/2018   Abnormal weight gain 02/12/2018   Allergic rhinitis 02/12/2018   Asthma 02/12/2018   Bradycardia 02/12/2018   Edema 02/12/2018   Heart murmur 02/12/2018   Hypercalcemia 02/12/2018   Hypokalemia 02/12/2018   Pure hypercholesterolemia 02/12/2018   Shoulder joint pain 02/12/2018   Skin sensation disturbance 02/12/2018   Syncope 02/12/2018   Vitamin D  deficiency 02/12/2018   Central cervical cord injury, 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, 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) 04/24/2017   Muscle spasticity 04/24/2017   Weight loss 12/19/2016   Hypothyroidism 11/24/2016   Bilateral leg edema 11/24/2016   Cervical radiculopathy 11/24/2016   Disc degeneration, lumbar 11/24/2016   Essential hypertension 11/24/2016   Gallstones 11/24/2016   History of anxiety state 11/24/2016   Hyperlipidemia, unspecified 11/24/2016   Spinal stenosis of lumbar region with neurogenic claudication 11/08/2016   Anxiety 06/08/2015   PCP: Ashley Boby LABOR, MD  REFERRING PROVIDER: Teresa Beryl CROME, PA-C  REFERRING DIAG: [phone] (ICD-10-CM) - Anterior dislocation of left humerus   THERAPY DIAG:  Post-traumatic stiffness of left shoulder joint  Acute pain of left  shoulder  Muscle weakness (generalized)  Gait difficulty  Balance disorder  Rationale for Evaluation and Treatment: Rehabilitation  ONSET DATE: 10/20/23  SUBJECTIVE:                                                                                                                                                                                      SUBJECTIVE STATEMENT: Pt. Reports falling while pushing shopping cart in parking lot at [place].  Pt. Hit head resulting in lacerations/ stitches and L anterior shoulder dislocation.  Pt. Reports 0/10 L shoulder pain at rest and 2/10 pain with eating/brushing teeth.  Pt. Was using SPC on L prior to fall but  limited due to shoulder sling.  Pt. Entered PT in w/c today due to inability to use L UE at this time.  Hand dominance: Left  PERTINENT HISTORY: Subjective   Ashla Murph is a 83 y.o. female in today for: HPI: History of Present Illness  Patient here today for suture removal. She underwent lac repair of her forehead after a fall on concrete 12/30. Repaired 2 lacs with 2 interrupted sutures and 6 running locked sutures.   Today, patient reports healing well. She has had a nodule come up on her left wrist. It is mildly tender to palpation, does not bother her otherwise.   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 90 tablet 3  atorvastatin  (LIPITOR) 40 MG tablet TAKE 1 TABLET(40 MG) BY MOUTH EVERY DAY 90 tablet 3  biotin 5 mg Tab Take by mouth  busPIRone (BUSPAR) 5 MG tablet Take 1 tablet (5 mg total) by mouth 2 (two) times daily as needed 30 tablet 5  calcium  lactate 100 mg calcium  Tab Take by mouth  cholecalciferol , vitamin D3, (VITAMIN D3) 125 mcg (5,000 unit) Tab Take 1 tablet by mouth every morning 90 tablet 3  co-enzyme Q-10, ubiquinone, 200 mg capsule Take 200 mg by mouth every morning.   cyanocobalamin (VITAMIN B12) 1,000 mcg SL tablet Take by mouth  ferrous sulfate  325 (65 FE) MG tablet Take 1 tablet (325 mg total) by mouth twice a week 90 tablet 3  FUROsemide  (LASIX ) 20 MG tablet Take 1-2 tablets (20-40 mg total) by mouth once daily as needed for Edema 60 tablet 4  gabapentin  (NEURONTIN ) 300 MG capsule TAKE ONE CAPSULE BY MOUTH THREE TIMES DAILY TO FOUR TIMES DAILY AS NEEDED FOR PAIN 360 capsule 3  Lactobacillus acidophilus (PROBIOTIC ORAL) Take by mouth once daily  levothyroxine  (SYNTHROID ) 50 MCG tablet Take 1.5 tablets (75 mcg total) by mouth once daily 135 tablet 3  lidocaine  (LIDODERM ) 5 % patch as needed  losartan  (COZAAR ) 100 MG tablet TAKE 1 TABLET(100 MG) BY MOUTH EVERY DAY 90 tablet 3  MAGNESIUM ORAL Take by mouth  VIT C/VIT E AC/LUT/COPPER/ZINC  (PRESERVISION LUTEIN ORAL) Take 1 tablet by mouth every morning. Last dose one week prior to procedure  ZINC  ORAL Take 50 mg by mouth once daily   No facility-administered medications prior to visit.   Objective   Vitals:  10/30/23 1350  PainSc: 0-No pain   There is no height or weight on file to calculate BMI. Home Vitals:  Physical Exam Constitutional:  General: She is not in acute distress. Appearance: Normal appearance.  HENT:  Head: Normocephalic and atraumatic.  Eyes:  General: No scleral icterus. Extraocular Movements: Extraocular movements intact.  Conjunctiva/sclera: Conjunctivae normal.   Cardiovascular:  Rate and Rhythm: Normal rate and regular rhythm.  Pulmonary:  Effort: Pulmonary effort is normal. No respiratory distress.  Breath sounds: Normal breath sounds.  Musculoskeletal:  General:  Normal range of motion.  Cervical back: Normal range of motion and neck supple.  Comments: Head lacerations with good interval healing. All sutures visualized for removal.   Left hand with significant bruising from PIPs to upper wrist, interval resolution present. There is a cystic lesion, nonmobile, mildly tender to manipulation present in her posterior central wrist.  Skin: General: Skin is warm and dry.  Coloration: Skin is not jaundiced.  Findings: No erythema or rash.  Neurological:  General: No focal deficit present.  Mental Status: She is alert.  Cranial Nerves: No cranial nerve deficit.  Coordination: Coordination normal.   Assessment & Plan  Diagnoses and all orders for this visit:  Visit for suture removal - Running central scalp suture and 2x interrupted suture of right scalp removed without difficulty. Area clean with good interval healing to wound.  - Recommend gentle washing with soap and water. Avoid aggressive scrubbing. Keep area clean and dry. Reviewed warning s/s for wound healing or new onset infection requiring further evaluation.  Wrist lesion - Possible ganglion cyst vs healing blister pocket, difficult to evaluate fully with overall bruising and injury from her recent fall. Reviewed possible diagnoses. If pain, worsening swelling, numbness/tingling or movement limitations recommend earlier evaluation. Otherwise recommend evaluation by orthopedics at her follow up if symptoms do not resolve with the rest of her injuries.   PAIN:  Are you having pain? Yes: NPRS scale: 2/10 Pain location: L shoulder Pain description: sharp/aching Aggravating factors: movement/ brushing teeth Relieving factors: rest/ use of sling  PRECAUTIONS: Shoulder  RED  FLAGS: None   WEIGHT BEARING RESTRICTIONS: No  FALLS:  Has patient fallen in last 6 months? Yes. Number of falls 2  LIVING ENVIRONMENT: Lives with: lives with their spouse Lives in: House/apartment Has following equipment at home: Single point cane and Wheelchair (manual)  OCCUPATION: Retired  PLOF: Independent with household mobility with device  PATIENT GOALS:  Increase L shoulder AROM/ strength/ pain-free mobility.    NEXT MD VISIT: 11/14/23 with Dr. Gini (ortho MD)  OBJECTIVE:  Note: Objective measures were completed at Evaluation unless otherwise noted.  DIAGNOSTIC FINDINGS:  See imaging  PATIENT SURVEYS:  FOTO initial 32/ goal 54   COGNITION: Overall cognitive status: Within functional limits for tasks assessed     SENSATION: WFL.  Moderate L hand bruising.    POSTURE: Rounded shoulders/ forward posture.  Use of L shoulder sling.    UPPER EXTREMITY ROM:  Full tear of R supraspinatus reported.    Passive ROM Right Eval AROM Left Eval PROM  Shoulder flexion 74 deg. 53 deg.  Shoulder extension    Shoulder abduction 70 deg. 42 deg.  Shoulder adduction    Shoulder internal rotation  [facility]  Shoulder external rotation  0 deg.  Elbow flexion [facility] WFL  Elbow extension [facility] WFL  Wrist flexion    Wrist extension    Wrist ulnar deviation    Wrist radial deviation    Wrist pronation    Wrist supination    (Blank rows = not tested)  UPPER EXTREMITY MMT: No MMT testing today.    SHOULDER SPECIAL TESTS: NT  JOINT MOBILITY TESTING:  No L shoulder joint mobs. Secondary to anterior dislocation.    PALPATION:  Generalized L hand tenderness/ bruising.    4/15:  Passive ROM Right seated AROM Left seated PROM  Shoulder flexion 82 deg. 95 deg.  Shoulder extension    Shoulder abduction 74 deg. 88 deg.  Shoulder adduction    Shoulder  internal rotation [facility]  Shoulder external rotation 30 deg. 0 deg.  Elbow flexion [facility] WFL  Elbow extension [facility]   Wrist flexion    Wrist extension    Wrist ulnar deviation    Wrist radial deviation    Wrist pronation    Wrist supination    (Blank rows = not tested)    Passive ROM Right seated AROM Left seated P/AROM  Shoulder flexion 84 deg. 128/48 deg.  Shoulder extension    Shoulder abduction 78 deg. 124/88 deg.  Shoulder adduction    Shoulder internal rotation [facility]  Shoulder external rotation 30 deg. 8 deg.  Elbow flexion [facility] WFL  Elbow extension [facility] WFL  Wrist flexion    Wrist extension    Wrist ulnar deviation    Wrist radial deviation    Wrist pronation    Wrist supination    (Blank rows = not tested)    Gait assessment:  R antalgic gait with use of SPC.  Limited hip/knee flexion and step length with step to gait pattern.  No arm swing, esp. On R.  1 episode of shuffling R foot on floor resulting in a self-corrected balance check.    B LE MMT: R/L hip flexion (3/4), abduction (4/4), knee extension (4/4+), knee flexion (4+/4+).  Moderate R lower lower leg pitting edema (discussed benefits of compression stockings).    5xSTS:  19.0 sec./ 15.9 sec.   Berg:  40/56 (fall risk).    LEFS:  30 out of 80.                                                                           TREATMENT DATE: 05/13/2024  Subjective: Pt. Entered PT with use of SPC and step to gait on R.  No reports of falls since last PT tx. Session.  Pt. Continues to use L UE with functional reaching for ADLs and remains busy with daily tasks at home.      There.act.:  Sit to stands from elevated mat table (varying heights)- 10x with no UE assist.  Good wt. Shifting/ B LE wt. Bearing.  Added shoulder flexion with stands 10x2.    Lateral walking L/R in front of mat table with no UE assist  B UBE 2 min. F/b (warm-up B shoulders).    Walking at agility ladder to increase recip. Pattern with cuing from blue marks.  Pt. Challenged with consistent recip. Pattern.    Seated L shoulder flexion AAROM with  isometric holds and controlled eccentric lowering 5x2.    Nautilus: AAROM/min. Assist with all movements (wand).     Standing lat. pull down 30# 30x. Standing tricep extension 20# 30x.   Standing scap. Retraction 30# 20x2.  L/R alterating modified stance.    Seated L shoulder manual isometrics (IR/ER) 5x each with min. To mod. Resistance.  Pain limited with abduction/ limited hold time.   Seated L shoulder PROM (flexion/ abduction/ ER)- 5x each.  Encouraged active holds at tolerable passive end-range.     PATIENT EDUCATION: Education details: Reviewed HEP Person educated: Patient and Spouse Education method: Medical illustrator Education comprehension: verbalized understanding and returned demonstration  HOME EXERCISE PROGRAM: Access Code: [email or web address] URL: [email or web address] Date:  11/06/2023 Prepared by: Ozell Sero  Exercises - Seated Scapular Retraction  - 1 x daily - 7 x weekly - 2 sets - 10 reps - Standing Isometric Shoulder External Rotation with Doorway  - 1 x daily - 7 x weekly - 2 sets - 10 reps - 5-10sec hold - Isometric Shoulder Abduction at Wall  - 1 x daily - 7 x weekly - 2 sets - 10 reps - 5-10 sec hold - Isometric Shoulder Flexion at Wall  - 1 x daily - 7 x weekly - 2 sets - 10 reps - 5-10 sec hold   Access Code: [email or web address] URL: [email or web address] Date: 11/18/2023 Prepared by: Ozell Sero  Exercises - Seated Scapular Retraction  - 1 x daily - 7 x weekly - 2 sets - 10 reps - Supine Shoulder External Rotation AAROM with Dowel  - 1 x daily - 7 x weekly - 3 sets - 10 reps - Supine Shoulder Flexion Overhead with Dowel  - 1 x daily - 7 x weekly - 3 sets - 10 reps - Supine Shoulder Press with Dowel  - 1 x daily - 7 x weekly - 3 sets - 10 reps - Supine Shoulder Abduction AAROM with Dowel  - 1 x daily - 7 x weekly - 3 sets - 10 reps - Supine Shoulder Flexion AAROM with Dowel  - 1 x daily - 7 x weekly - 3 sets - 10 reps  Access  Code: [email or web address] URL: [email or web address] Date: 12/02/2023 Prepared by: Ozell Sero  Exercises - Supine Shoulder External Rotation AAROM with Dowel  - 1 x daily - 5 x weekly - 3 sets - 10 reps - Supine Shoulder Flexion Overhead with Dowel  - 1 x daily - 5 x weekly - 3 sets - 10 reps - Supine Shoulder Press with Dowel  - 1 x daily - 5 x weekly - 3 sets - 10 reps - Supine Shoulder Abduction AAROM with Dowel  - 1 x daily - 5 x weekly - 3 sets - 10 reps - Supine Shoulder Flexion AAROM with Dowel  - 1 x daily - 5 x weekly - 3 sets - 10 reps - Shoulder extension with resistance - Neutral  - 1 x daily - 5 x weekly - 3 sets - 10 reps - Standing Tricep Extensions with Resistance  - 1 x daily - 5 x weekly - 3 sets - 10 reps - Standing Single Arm Bicep Curls Supinated with Dumbbell  - 1 x daily - 5 x weekly - 3 sets - 10 reps  ASSESSMENT:  CLINICAL IMPRESSION:  PT tx. Focused on progressing L shoulder A/AROM and strengthening in pain tolerable range to progress functional reaching/ ADLs.  Pts. Balance challenged with standing UE resisted ex. But able to complete with extra time/ SBA.   Good sh. Control with static isometrics during sh. Flexion but limited with L shoulder abduction holds secondary to pain/ weakness.  Pt. Remains motivated to increase L shoulder motor control and improve LE strength/ balance with daily walking.  Pt. will remain compliant with HEP/ daily use of UE and SPC with gait.  Pt. Requires CGA/cuing for consistent recip. Gait pattern.   No change to HEP.  Pt. Will benefit from skilled PT services to increase L shoulder ROM/ strength to promote return to PLOF/ mobility with SPC.       OBJECTIVE IMPAIRMENTS: decreased activity tolerance, decreased endurance, decreased ROM, decreased strength, hypomobility, impaired flexibility, impaired sensation, improper body mechanics, postural dysfunction, and  pain.   ACTIVITY LIMITATIONS: carrying, lifting, bathing, toileting,  dressing, self feeding, reach over head, and hygiene/grooming  PARTICIPATION LIMITATIONS: meal prep, cleaning, laundry, and shopping  PERSONAL FACTORS: Fitness and Past/current experiences are also affecting patient's functional outcome.   REHAB POTENTIAL: Good  CLINICAL DECISION MAKING: Stable/uncomplicated  EVALUATION COMPLEXITY: Moderate   GOALS: Goals reviewed with patient? Yes  LONG TERM GOALS: Target date: 05/31/24  Pt. Able to complete HEP with no increase c/o L shoulder pain to improve L sh. Joint mobility/ stability.   Baseline: 5/12: minimal c/o L shoulder pain/ soreness Goal status: Partially met  2.  Pt. Will increase L shoulder AROM to >100 deg. Flexion to improve management of hair/ feeding.   Baseline: no L shoulder AROM at this time.   2/24:  pt. Challenged managing hair with compensatory movement patterns.  4/15: see above chart.  5/12: standing AAROM with isometric hold at 115 deg.  AROM <90 deg. Goal status: Not met  3.  Pt. Will increase LEFS to >45 out of 80 to improve safety with walking/ daily functional tasks.    Baseline: 30 out of 80. Goal status: Initial  4.  Pt. Will be able to dress/ groom independently with no shoulder pain/ limitations.    Baseline: limited with use of L shoulder during dressing.  5/12: limited with L shoulder IR behind back and bring L hand to head.    Goal status: Partially met  5.  Pt. Will increase Berg balance test to >45 out of 56 to decrease fall risk/ improve safety with walking/ prevent falls.   Baseline: 40 out of 56.   Goal status:  Initial   PLAN:  PT FREQUENCY: 1-2x/week  PT DURATION: 6 weeks  PLANNED INTERVENTIONS: 97110-Therapeutic exercises, 97530- Therapeutic activity, [id number]- Neuromuscular re-education, 97535- Self Care, [id number]- Manual therapy, Patient/Family education, Joint mobilization, DME instructions, Cryotherapy, and Moist heat  PLAN FOR NEXT SESSION:  Progress nautilus exercises in standing positions.   Issue LE strengthening ex./ balance tasks.   Ozell JAYSON Sero, PT, DPT # [phone] Physical Therapist - [facility] 05/13/2024, 9:34 AM

## 2024-05-17 ENCOUNTER — Ambulatory Visit: Admitting: Physical Therapy

## 2024-05-17 DIAGNOSIS — M25612 Stiffness of left shoulder, not elsewhere classified: Secondary | ICD-10-CM

## 2024-05-17 DIAGNOSIS — M6281 Muscle weakness (generalized): Secondary | ICD-10-CM

## 2024-05-17 DIAGNOSIS — R2689 Other abnormalities of gait and mobility: Secondary | ICD-10-CM

## 2024-05-17 DIAGNOSIS — R269 Unspecified abnormalities of gait and mobility: Secondary | ICD-10-CM

## 2024-05-17 DIAGNOSIS — M25512 Pain in left shoulder: Secondary | ICD-10-CM

## 2024-05-17 NOTE — Therapy (Signed)
 OUTPATIENT PHYSICAL THERAPY SHOULDER and BALANCE TREATMENT  Patient Name: Evelyn Cisneros MRN: [id number] DOB:08/13/1941, 83 y.o., female Today's Date: 05/17/2024  END OF SESSION:  PT End of Session - 05/17/24 2027     Visit Number 35    Number of Visits 43    Date for PT Re-Evaluation 05/31/24    PT Start Time 1111    PT Stop Time 1200    PT Time Calculation (min) 49 min         Past Medical History:  Diagnosis Date   Anemia    Arthritis    Heart murmur    Hypertension    Hypothyroidism    Spondylolisthesis of lumbar region    Past Surgical History:  Procedure Laterality Date   ABDOMINAL HYSTERECTOMY     REPLACEMENT TOTAL KNEE BILATERAL     SPINE SURGERY     Patient Active Problem List   Diagnosis Date Noted   Spondylolisthesis of lumbar region 05/04/2018   Abnormal glucose level 02/12/2018   Abnormal weight gain 02/12/2018   Allergic rhinitis 02/12/2018   Asthma 02/12/2018   Bradycardia 02/12/2018   Edema 02/12/2018   Heart murmur 02/12/2018   Hypercalcemia 02/12/2018   Hypokalemia 02/12/2018   Pure hypercholesterolemia 02/12/2018   Shoulder joint pain 02/12/2018   Skin sensation disturbance 02/12/2018   Syncope 02/12/2018   Vitamin D  deficiency 02/12/2018   Central cervical cord injury, 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, 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) 04/24/2017   Muscle spasticity 04/24/2017   Weight loss 12/19/2016   Hypothyroidism 11/24/2016   Bilateral leg edema 11/24/2016   Cervical radiculopathy 11/24/2016   Disc degeneration, lumbar 11/24/2016   Essential hypertension 11/24/2016   Gallstones 11/24/2016   History of anxiety state 11/24/2016   Hyperlipidemia, unspecified 11/24/2016   Spinal stenosis of lumbar region with neurogenic claudication 11/08/2016   Anxiety 06/08/2015   PCP: Ashley Boby LABOR, MD  REFERRING PROVIDER: Teresa Beryl CROME, PA-C  REFERRING DIAG: [phone] (ICD-10-CM) - Anterior dislocation of left humerus   THERAPY DIAG:  Post-traumatic stiffness of  left shoulder joint  Acute pain of left shoulder  Muscle weakness (generalized)  Gait difficulty  Balance disorder  Rationale for Evaluation and Treatment: Rehabilitation  ONSET DATE: 10/20/23  SUBJECTIVE:                                                                                                                                                                                      SUBJECTIVE STATEMENT: Pt. Reports falling while pushing shopping cart in parking lot at [place].  Pt. Hit head resulting in lacerations/ stitches and L anterior shoulder dislocation.  Pt. Reports 0/10 L shoulder pain at rest and  2/10 pain with eating/brushing teeth.  Pt. Was using SPC on L prior to fall but limited due to shoulder sling.  Pt. Entered PT in w/c today due to inability to use L UE at this time.  Hand dominance: Left  PERTINENT HISTORY: Subjective   Evelyn Cisneros is a 83 y.o. female in today for: HPI: History of Present Illness  Patient here today for suture removal. She underwent lac repair of her forehead after a fall on concrete 12/30. Repaired 2 lacs with 2 interrupted sutures and 6 running locked sutures.   Today, patient reports healing well. She has had a nodule come up on her left wrist. It is mildly tender to palpation, does not bother her otherwise.   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 90 tablet 3  atorvastatin  (LIPITOR) 40 MG tablet TAKE 1 TABLET(40 MG) BY MOUTH EVERY DAY 90 tablet 3  biotin 5 mg Tab Take by mouth  busPIRone (BUSPAR) 5 MG tablet Take 1 tablet (5 mg total) by mouth 2 (two) times daily as needed 30 tablet 5  calcium  lactate 100 mg calcium  Tab Take by mouth  cholecalciferol , vitamin D3, (VITAMIN D3) 125 mcg (5,000 unit) Tab Take 1 tablet by mouth every morning 90 tablet 3  co-enzyme Q-10, ubiquinone, 200 mg capsule Take 200 mg by mouth every morning.   cyanocobalamin (VITAMIN B12) 1,000 mcg SL tablet Take by mouth  ferrous sulfate  325 (65 FE) MG tablet Take 1 tablet (325 mg total) by mouth twice a week 90 tablet 3  FUROsemide  (LASIX ) 20 MG tablet Take 1-2 tablets (20-40 mg total) by mouth once daily as needed for Edema 60 tablet 4  gabapentin  (NEURONTIN ) 300 MG capsule TAKE ONE CAPSULE BY MOUTH THREE TIMES DAILY TO FOUR TIMES DAILY AS NEEDED FOR PAIN 360 capsule 3  Lactobacillus acidophilus (PROBIOTIC ORAL) Take by mouth once daily  levothyroxine  (SYNTHROID ) 50 MCG tablet Take 1.5 tablets (75 mcg total) by mouth once daily 135 tablet 3  lidocaine  (LIDODERM ) 5 % patch as needed  losartan  (COZAAR ) 100 MG tablet TAKE 1 TABLET(100 MG) BY MOUTH EVERY DAY 90 tablet 3  MAGNESIUM ORAL Take by mouth  VIT C/VIT E AC/LUT/COPPER/ZINC  (PRESERVISION LUTEIN ORAL) Take 1 tablet by mouth every morning. Last dose one week prior to procedure  ZINC  ORAL Take 50 mg by mouth once daily   No facility-administered medications prior to visit.   Objective   Vitals:  10/30/23 1350  PainSc: 0-No pain   There is no height or weight on file to calculate BMI. Home Vitals:  Physical Exam Constitutional:  General: She is not in acute distress. Appearance: Normal appearance.  HENT:  Head: Normocephalic and atraumatic.  Eyes:  General: No scleral icterus. Extraocular Movements: Extraocular movements intact.   Conjunctiva/sclera: Conjunctivae normal.  Cardiovascular:  Rate and Rhythm: Normal rate and regular rhythm.  Pulmonary:  Effort: Pulmonary  effort is normal. No respiratory distress.  Breath sounds: Normal breath sounds.  Musculoskeletal:  General: Normal range of motion.  Cervical back: Normal range of motion and neck supple.  Comments: Head lacerations with good interval healing. All sutures visualized for removal.   Left hand with significant bruising from PIPs to upper wrist, interval resolution present. There is a cystic lesion, nonmobile, mildly tender to manipulation present in her posterior central wrist.  Skin: General: Skin is warm and dry.  Coloration: Skin is not jaundiced.  Findings: No erythema or rash.  Neurological:  General: No focal deficit present.  Mental Status: She is alert.  Cranial Nerves: No cranial nerve deficit.  Coordination: Coordination normal.   Assessment & Plan  Diagnoses and all orders for this visit:  Visit for suture removal - Running central scalp suture and 2x interrupted suture of right scalp removed without difficulty. Area clean with good interval healing to wound.  - Recommend gentle washing with soap and water. Avoid aggressive scrubbing. Keep area clean and dry. Reviewed warning s/s for wound healing or new onset infection requiring further evaluation.  Wrist lesion - Possible ganglion cyst vs healing blister pocket, difficult to evaluate fully with overall bruising and injury from her recent fall. Reviewed possible diagnoses. If pain, worsening swelling, numbness/tingling or movement limitations recommend earlier evaluation. Otherwise recommend evaluation by orthopedics at her follow up if symptoms do not resolve with the rest of her injuries.   PAIN:  Are you having pain? Yes: NPRS scale: 2/10 Pain location: L shoulder Pain description: sharp/aching Aggravating factors: movement/ brushing teeth Relieving factors: rest/ use of  sling  PRECAUTIONS: Shoulder  RED FLAGS: None   WEIGHT BEARING RESTRICTIONS: No  FALLS:  Has patient fallen in last 6 months? Yes. Number of falls 2  LIVING ENVIRONMENT: Lives with: lives with their spouse Lives in: House/apartment Has following equipment at home: Single point cane and Wheelchair (manual)  OCCUPATION: Retired  PLOF: Independent with household mobility with device  PATIENT GOALS:  Increase L shoulder AROM/ strength/ pain-free mobility.    NEXT MD VISIT: 11/14/23 with Dr. Gini (ortho MD)  OBJECTIVE:  Note: Objective measures were completed at Evaluation unless otherwise noted.  DIAGNOSTIC FINDINGS:  See imaging  PATIENT SURVEYS:  FOTO initial 32/ goal 54   COGNITION: Overall cognitive status: Within functional limits for tasks assessed     SENSATION: WFL.  Moderate L hand bruising.    POSTURE: Rounded shoulders/ forward posture.  Use of L shoulder sling.    UPPER EXTREMITY ROM:  Full tear of R supraspinatus reported.    Passive ROM Right Eval AROM Left Eval PROM  Shoulder flexion 74 deg. 53 deg.  Shoulder extension    Shoulder abduction 70 deg. 42 deg.  Shoulder adduction    Shoulder internal rotation  [facility]  Shoulder external rotation  0 deg.  Elbow flexion [facility] WFL  Elbow extension [facility] WFL  Wrist flexion    Wrist extension    Wrist ulnar deviation    Wrist radial deviation    Wrist pronation    Wrist supination    (Blank rows = not tested)  UPPER EXTREMITY MMT: No MMT testing today.    SHOULDER SPECIAL TESTS: NT  JOINT MOBILITY TESTING:  No L shoulder joint mobs. Secondary to anterior dislocation.    PALPATION:  Generalized L hand tenderness/ bruising.    4/15:  Passive ROM Right seated AROM Left seated PROM  Shoulder flexion 82 deg. 95 deg.  Shoulder extension  Shoulder abduction 74 deg. 88 deg.  Shoulder adduction    Shoulder internal rotation [facility]  Shoulder external rotation 30 deg. 0 deg.  Elbow flexion  [facility] WFL  Elbow extension [facility]  Wrist flexion    Wrist extension    Wrist ulnar deviation    Wrist radial deviation    Wrist pronation    Wrist supination    (Blank rows = not tested)    Passive ROM Right seated AROM Left seated P/AROM  Shoulder flexion 84 deg. 128/48 deg.  Shoulder extension    Shoulder abduction 78 deg. 124/88 deg.  Shoulder adduction    Shoulder internal rotation [facility]  Shoulder external rotation 30 deg. 8 deg.  Elbow flexion [facility] WFL  Elbow extension [facility] WFL  Wrist flexion    Wrist extension    Wrist ulnar deviation    Wrist radial deviation    Wrist pronation    Wrist supination    (Blank rows = not tested)    Gait assessment:  R antalgic gait with use of SPC.  Limited hip/knee flexion and step length with step to gait pattern.  No arm swing, esp. On R.  1 episode of shuffling R foot on floor resulting in a self-corrected balance check.    B LE MMT: R/L hip flexion (3/4), abduction (4/4), knee extension (4/4+), knee flexion (4+/4+).  Moderate R lower lower leg pitting edema (discussed benefits of compression stockings).    5xSTS:  19.0 sec./ 15.9 sec.   Berg:  40/56 (fall risk).    LEFS:  30 out of 80.                                                                           TREATMENT DATE: 05/17/2024  Subjective: No reports of falls since last PT tx. Session.  Pt. Reports she is challenged with walking outside of church due to brick walkway.  Pt. Continues to use L UE with functional reaching for ADLs and remains busy with daily tasks at home.      There.act.:  Sit to stands from gray chair without UE assist and added shoulder flexion with use of wand 10x.  PT assist with AAROM sh. Flexion.    Seated B shoulder wand ex.: shoulder flexion/ push and pull with PT resistance 10x.    B UBE 2 min. F/b (warm-up B shoulders).    Step lunges in //-bars: L/R (challenged with R step lunges and requires UE assist for safety).    Standing 6 step  touches (alternating)/ hip abduction/ hip extension/ heel raises with light UE assist on handrails at stairs.    Seated L shoulder flexion AAROM with isometric holds and controlled eccentric lowering 5x2.    Nautilus: AAROM/min. Assist with all movements (wand).     Standing lat. pull down 30# 30x. Standing tricep extension 20# 30x.   Standing scap. Retraction 30# 20x2.  L/R alterating modified stance.    Seated L shoulder PROM (flexion/ abduction/ ER)- 5x each.  Encouraged active holds at tolerable passive end-range.     PATIENT EDUCATION: Education details: Reviewed HEP Person educated: Patient and Spouse Education method: Medical illustrator Education comprehension: verbalized understanding and returned demonstration  HOME EXERCISE PROGRAM: Access Code: [email or web address] URL: [email or web address] Date: 11/06/2023 Prepared by: Ozell Sero  Exercises - Seated Scapular Retraction  - 1 x daily - 7 x weekly - 2 sets - 10 reps - Standing Isometric Shoulder External Rotation with Doorway  - 1 x daily - 7 x weekly - 2 sets - 10 reps - 5-10sec hold - Isometric Shoulder Abduction at Wall  - 1 x daily - 7 x weekly - 2 sets - 10 reps - 5-10 sec hold - Isometric Shoulder Flexion at Wall  - 1 x daily - 7 x weekly - 2 sets - 10 reps - 5-10 sec hold   Access Code: [email or web address] URL: [email or web address] Date: 11/18/2023 Prepared by: Ozell Sero  Exercises - Seated Scapular Retraction  - 1 x daily - 7 x weekly - 2 sets - 10 reps - Supine Shoulder External Rotation AAROM with Dowel  - 1 x daily - 7 x weekly - 3 sets - 10 reps - Supine Shoulder Flexion Overhead with Dowel  - 1 x daily - 7 x weekly - 3 sets - 10 reps - Supine Shoulder Press with Dowel  - 1 x daily - 7 x weekly - 3 sets - 10 reps - Supine Shoulder Abduction AAROM with Dowel  - 1 x daily - 7 x weekly - 3 sets - 10 reps - Supine Shoulder Flexion AAROM with Dowel  - 1 x daily - 7 x weekly - 3 sets -  10 reps  Access Code: [email or web address] URL: [email or web address] Date: 12/02/2023 Prepared by: Ozell Sero  Exercises - Supine Shoulder External Rotation AAROM with Dowel  - 1 x daily - 5 x weekly - 3 sets - 10 reps - Supine Shoulder Flexion Overhead with Dowel  - 1 x daily - 5 x weekly - 3 sets - 10 reps - Supine Shoulder Press with Dowel  - 1 x daily - 5 x weekly - 3 sets - 10 reps - Supine Shoulder Abduction AAROM with Dowel  - 1 x daily - 5 x weekly - 3 sets - 10 reps - Supine Shoulder Flexion AAROM with Dowel  - 1 x daily - 5 x weekly - 3 sets - 10 reps - Shoulder extension with resistance - Neutral  - 1 x daily - 5 x weekly - 3 sets - 10 reps - Standing Tricep Extensions with Resistance  - 1 x daily - 5 x weekly - 3 sets - 10 reps - Standing Single Arm Bicep Curls Supinated with Dumbbell  - 1 x daily - 5 x weekly - 3 sets - 10 reps  ASSESSMENT:  CLINICAL IMPRESSION:  PT tx. Focused on progressing L shoulder A/AROM and strengthening in pain tolerable range to progress functional reaching/ ADLs.  Pts. Balance challenged with standing UE resisted ex. But able to complete with extra time/ SBA.   Good sh. Control with static isometrics during sh. Flexion but limited with L shoulder abduction holds secondary to pain/ weakness.  Pt. Remains motivated to increase L shoulder motor control and improve LE strength/ balance with daily walking.  Pt. will remain compliant with HEP/ daily use of UE and SPC with gait.  Pt. Requires CGA/cuing for consistent recip. Gait pattern.   No change to HEP.  Pt. Will benefit from skilled PT services to increase L shoulder ROM/ strength to promote return to PLOF/ mobility with SPC.       OBJECTIVE IMPAIRMENTS: decreased activity tolerance, decreased endurance, decreased ROM, decreased strength, hypomobility, impaired  flexibility, impaired sensation, improper body mechanics, postural dysfunction, and pain.   ACTIVITY LIMITATIONS: carrying, lifting,  bathing, toileting, dressing, self feeding, reach over head, and hygiene/grooming  PARTICIPATION LIMITATIONS: meal prep, cleaning, laundry, and shopping  PERSONAL FACTORS: Fitness and Past/current experiences are also affecting patient's functional outcome.   REHAB POTENTIAL: Good  CLINICAL DECISION MAKING: Stable/uncomplicated  EVALUATION COMPLEXITY: Moderate   GOALS: Goals reviewed with patient? Yes  LONG TERM GOALS: Target date: 05/31/24  Pt. Able to complete HEP with no increase c/o L shoulder pain to improve L sh. Joint mobility/ stability.   Baseline: 5/12: minimal c/o L shoulder pain/ soreness Goal status: Partially met  2.  Pt. Will increase L shoulder AROM to >100 deg. Flexion to improve management of hair/ feeding.   Baseline: no L shoulder AROM at this time.   2/24:  pt. Challenged managing hair with compensatory movement patterns.  4/15: see above chart.  5/12: standing AAROM with isometric hold at 115 deg.  AROM <90 deg. Goal status: Not met  3.  Pt. Will increase LEFS to >45 out of 80 to improve safety with walking/ daily functional tasks.    Baseline: 30 out of 80. Goal status: Initial  4.  Pt. Will be able to dress/ groom independently with no shoulder pain/ limitations.    Baseline: limited with use of L shoulder during dressing.  5/12: limited with L shoulder IR behind back and bring L hand to head.    Goal status: Partially met  5.  Pt. Will increase Berg balance test to >45 out of 56 to decrease fall risk/ improve safety with walking/ prevent falls.   Baseline: 40 out of 56.   Goal status:  Initial   PLAN:  PT FREQUENCY: 1-2x/week  PT DURATION: 6 weeks  PLANNED INTERVENTIONS: 97110-Therapeutic exercises, 97530- Therapeutic activity, [id number]- Neuromuscular re-education, 97535- Self Care, [id number]- Manual therapy, Patient/Family education, Joint mobilization, DME instructions, Cryotherapy, and Moist heat  PLAN FOR NEXT SESSION:  Progress nautilus exercises in  standing positions.  Issue LE strengthening ex./ balance tasks.   Ozell JAYSON Sero, PT, DPT # [phone] Physical Therapist - [facility] 05/17/2024, 8:29 PM

## 2024-05-19 ENCOUNTER — Encounter: Payer: Self-pay | Admitting: Physical Therapy

## 2024-05-19 ENCOUNTER — Ambulatory Visit: Admitting: Physical Therapy

## 2024-05-19 DIAGNOSIS — R269 Unspecified abnormalities of gait and mobility: Secondary | ICD-10-CM

## 2024-05-19 DIAGNOSIS — M25612 Stiffness of left shoulder, not elsewhere classified: Secondary | ICD-10-CM | POA: Diagnosis not present

## 2024-05-19 DIAGNOSIS — M25512 Pain in left shoulder: Secondary | ICD-10-CM

## 2024-05-19 DIAGNOSIS — R2689 Other abnormalities of gait and mobility: Secondary | ICD-10-CM

## 2024-05-19 DIAGNOSIS — M6281 Muscle weakness (generalized): Secondary | ICD-10-CM

## 2024-05-19 NOTE — Therapy (Signed)
 OUTPATIENT PHYSICAL THERAPY SHOULDER and BALANCE TREATMENT  Patient Name: Evelyn Cisneros MRN: [id number] DOB:05/20/1941, 83 y.o., female Today's Date: 05/20/2024  END OF SESSION:  PT End of Session - 05/19/24 1111     Visit Number 36    Number of Visits 43    Date for PT Re-Evaluation 05/31/24    PT Start Time 1111    PT Stop Time 1201    PT Time Calculation (min) 50 min         Past Medical History:  Diagnosis Date   Anemia    Arthritis    Heart murmur    Hypertension    Hypothyroidism    Spondylolisthesis of lumbar region    Past Surgical History:  Procedure Laterality Date   ABDOMINAL HYSTERECTOMY     REPLACEMENT TOTAL KNEE BILATERAL     SPINE SURGERY     Patient Active Problem List   Diagnosis Date Noted   Spondylolisthesis of lumbar region 05/04/2018   Abnormal glucose level 02/12/2018   Abnormal weight gain 02/12/2018   Allergic rhinitis 02/12/2018   Asthma 02/12/2018   Bradycardia 02/12/2018   Edema 02/12/2018   Heart murmur 02/12/2018   Hypercalcemia 02/12/2018   Hypokalemia 02/12/2018   Pure hypercholesterolemia 02/12/2018   Shoulder joint pain 02/12/2018   Skin sensation disturbance 02/12/2018   Syncope 02/12/2018   Vitamin D  deficiency 02/12/2018   Central cervical cord injury, 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, 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) 04/24/2017   Muscle spasticity 04/24/2017   Weight loss 12/19/2016   Hypothyroidism 11/24/2016   Bilateral leg edema 11/24/2016   Cervical radiculopathy 11/24/2016   Disc degeneration, lumbar 11/24/2016   Essential hypertension 11/24/2016   Gallstones 11/24/2016   History of anxiety state 11/24/2016   Hyperlipidemia, unspecified 11/24/2016   Spinal stenosis of lumbar region with neurogenic claudication 11/08/2016   Anxiety 06/08/2015   PCP: Ashley Boby LABOR, MD  REFERRING PROVIDER: Teresa Beryl CROME, PA-C  REFERRING DIAG: [id number] (ICD-10-CM) - Anterior dislocation of left humerus   THERAPY DIAG:  Post-traumatic stiffness of  left shoulder joint  Acute pain of left shoulder  Muscle weakness (generalized)  Gait difficulty  Balance disorder  Rationale for Evaluation and Treatment: Rehabilitation  ONSET DATE: 10/20/23  SUBJECTIVE:                                                                                                                                                                                      SUBJECTIVE STATEMENT: Pt. Reports falling while pushing shopping cart in parking lot at [place].  Pt. Hit head resulting in lacerations/ stitches and L anterior shoulder dislocation.  Pt. Reports 0/10 L shoulder pain at rest and  2/10 pain with eating/brushing teeth.  Pt. Was using SPC on L prior to fall but limited due to shoulder sling.  Pt. Entered PT in w/c today due to inability to use L UE at this time.  Hand dominance: Left  PERTINENT HISTORY: Subjective   Evelyn Cisneros is a 83 y.o. female in today for: HPI: History of Present Illness  Patient here today for suture removal. She underwent lac repair of her forehead after a fall on concrete 12/30. Repaired 2 lacs with 2 interrupted sutures and 6 running locked sutures.   Today, patient reports healing well. She has had a nodule come up on her left wrist. It is mildly tender to palpation, does not bother her otherwise.   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 90 tablet 3  atorvastatin  (LIPITOR) 40 MG tablet TAKE 1 TABLET(40 MG) BY MOUTH EVERY DAY 90 tablet 3  biotin 5 mg Tab Take by mouth  busPIRone (BUSPAR) 5 MG tablet Take 1 tablet (5 mg total) by mouth 2 (two) times daily as needed 30 tablet 5  calcium  lactate 100 mg calcium  Tab Take by mouth  cholecalciferol , vitamin D3, (VITAMIN D3) 125 mcg (5,000 unit) Tab Take 1 tablet by mouth every morning 90 tablet 3  co-enzyme Q-10, ubiquinone, 200 mg capsule Take 200 mg by mouth every morning.   cyanocobalamin (VITAMIN B12) 1,000 mcg SL tablet Take by mouth  ferrous sulfate  325 (65 FE) MG tablet Take 1 tablet (325 mg total) by mouth twice a week 90 tablet 3  FUROsemide  (LASIX ) 20 MG tablet Take 1-2 tablets (20-40 mg total) by mouth once daily as needed for Edema 60 tablet 4  gabapentin  (NEURONTIN ) 300 MG capsule TAKE ONE CAPSULE BY MOUTH THREE TIMES DAILY TO FOUR TIMES DAILY AS NEEDED FOR PAIN 360 capsule 3  Lactobacillus acidophilus (PROBIOTIC ORAL) Take by mouth once daily  levothyroxine  (SYNTHROID ) 50 MCG tablet Take 1.5 tablets (75 mcg total) by mouth once daily 135 tablet 3  lidocaine  (LIDODERM ) 5 % patch as needed  losartan  (COZAAR ) 100 MG tablet TAKE 1 TABLET(100 MG) BY MOUTH EVERY DAY 90 tablet 3  MAGNESIUM ORAL Take by mouth  VIT C/VIT E AC/LUT/COPPER/ZINC  (PRESERVISION LUTEIN ORAL) Take 1 tablet by mouth every morning. Last dose one week prior to procedure  ZINC  ORAL Take 50 mg by mouth once daily   No facility-administered medications prior to visit.   Objective   Vitals:  10/30/23 1350  PainSc: 0-No pain   There is no height or weight on file to calculate BMI. Home Vitals:  Physical Exam Constitutional:  General: She is not in acute distress. Appearance: Normal appearance.  HENT:  Head: Normocephalic and atraumatic.  Eyes:  General: No scleral icterus. Extraocular Movements: Extraocular movements intact.   Conjunctiva/sclera: Conjunctivae normal.  Cardiovascular:  Rate and Rhythm: Normal rate and regular rhythm.  Pulmonary:  Effort: Pulmonary  effort is normal. No respiratory distress.  Breath sounds: Normal breath sounds.  Musculoskeletal:  General: Normal range of motion.  Cervical back: Normal range of motion and neck supple.  Comments: Head lacerations with good interval healing. All sutures visualized for removal.   Left hand with significant bruising from PIPs to upper wrist, interval resolution present. There is a cystic lesion, nonmobile, mildly tender to manipulation present in her posterior central wrist.  Skin: General: Skin is warm and dry.  Coloration: Skin is not jaundiced.  Findings: No erythema or rash.  Neurological:  General: No focal deficit present.  Mental Status: She is alert.  Cranial Nerves: No cranial nerve deficit.  Coordination: Coordination normal.   Assessment & Plan  Diagnoses and all orders for this visit:  Visit for suture removal - Running central scalp suture and 2x interrupted suture of right scalp removed without difficulty. Area clean with good interval healing to wound.  - Recommend gentle washing with soap and water. Avoid aggressive scrubbing. Keep area clean and dry. Reviewed warning s/s for wound healing or new onset infection requiring further evaluation.  Wrist lesion - Possible ganglion cyst vs healing blister pocket, difficult to evaluate fully with overall bruising and injury from her recent fall. Reviewed possible diagnoses. If pain, worsening swelling, numbness/tingling or movement limitations recommend earlier evaluation. Otherwise recommend evaluation by orthopedics at her follow up if symptoms do not resolve with the rest of her injuries.   PAIN:  Are you having pain? Yes: NPRS scale: 2/10 Pain location: L shoulder Pain description: sharp/aching Aggravating factors: movement/ brushing teeth Relieving factors: rest/ use of  sling  PRECAUTIONS: Shoulder  RED FLAGS: None   WEIGHT BEARING RESTRICTIONS: No  FALLS:  Has patient fallen in last 6 months? Yes. Number of falls 2  LIVING ENVIRONMENT: Lives with: lives with their spouse Lives in: House/apartment Has following equipment at home: Single point cane and Wheelchair (manual)  OCCUPATION: Retired  PLOF: Independent with household mobility with device  PATIENT GOALS:  Increase L shoulder AROM/ strength/ pain-free mobility.    NEXT MD VISIT: 11/14/23 with Dr. Gini (ortho MD)  OBJECTIVE:  Note: Objective measures were completed at Evaluation unless otherwise noted.  DIAGNOSTIC FINDINGS:  See imaging  PATIENT SURVEYS:  FOTO initial 32/ goal 6   COGNITION: Overall cognitive status: Within functional limits for tasks assessed     SENSATION: WFL.  Moderate L hand bruising.    POSTURE: Rounded shoulders/ forward posture.  Use of L shoulder sling.    UPPER EXTREMITY ROM:  Full tear of R supraspinatus reported.    Passive ROM Right Eval AROM Left Eval PROM  Shoulder flexion 74 deg. 53 deg.  Shoulder extension    Shoulder abduction 70 deg. 42 deg.  Shoulder adduction    Shoulder internal rotation  [facility]  Shoulder external rotation  0 deg.  Elbow flexion [facility] WFL  Elbow extension [facility] WFL  Wrist flexion    Wrist extension    Wrist ulnar deviation    Wrist radial deviation    Wrist pronation    Wrist supination    (Blank rows = not tested)  UPPER EXTREMITY MMT: No MMT testing today.    SHOULDER SPECIAL TESTS: NT  JOINT MOBILITY TESTING:  No L shoulder joint mobs. Secondary to anterior dislocation.    PALPATION:  Generalized L hand tenderness/ bruising.    4/15:  Passive ROM Right seated AROM Left seated PROM  Shoulder flexion 82 deg. 95 deg.  Shoulder extension  Shoulder abduction 74 deg. 88 deg.  Shoulder adduction    Shoulder internal rotation [facility]  Shoulder external rotation 30 deg. 0 deg.  Elbow flexion  [facility] WFL  Elbow extension [facility]  Wrist flexion    Wrist extension    Wrist ulnar deviation    Wrist radial deviation    Wrist pronation    Wrist supination    (Blank rows = not tested)    Passive ROM Right seated AROM Left seated P/AROM  Shoulder flexion 84 deg. 128/48 deg.  Shoulder extension    Shoulder abduction 78 deg. 124/88 deg.  Shoulder adduction    Shoulder internal rotation [facility]  Shoulder external rotation 30 deg. 8 deg.  Elbow flexion [facility] WFL  Elbow extension [facility] WFL  Wrist flexion    Wrist extension    Wrist ulnar deviation    Wrist radial deviation    Wrist pronation    Wrist supination    (Blank rows = not tested)    Gait assessment:  R antalgic gait with use of SPC.  Limited hip/knee flexion and step length with step to gait pattern.  No arm swing, esp. On R.  1 episode of shuffling R foot on floor resulting in a self-corrected balance check.    B LE MMT: R/L hip flexion (3/4), abduction (4/4), knee extension (4/4+), knee flexion (4+/4+).  Moderate R lower lower leg pitting edema (discussed benefits of compression stockings).    5xSTS:  19.0 sec./ 15.9 sec.   Berg:  40/56 (fall risk).    LEFS:  30 out of 80.                                                                           TREATMENT DATE: 05/20/2024  Subjective: No reports no falls or trips since last PT tx. Session.   Pt. Remains compliant with HEP and remains busy with daily activities. No new complaints.   There.act.:  B UBE 3 min. F/b (warm-up B shoulders).    Standing wall ladder (shoulder flexion)- 6x to 2nd heart sticker.  Standing 6 step touches (alternating)/ hip abduction/ hip extension/ heel raises with light UE assist on handrails at stairs.  Pt. Unable to complete without at least 1 UE assist.   Sit to stands from gray chair without UE assist and added shoulder flexion with use of wand 10x.  PT assist with AAROM sh. Flexion.    Seated B shoulder wand ex.: shoulder  flexion/ push and pull with PT resistance 10x.    Seated L shoulder flexion AAROM with isometric holds and controlled eccentric lowering 5x2.    Nautilus: AAROM/min. Assist with all movements (wand).     Standing lat. pull down 30# 30x. Standing tricep extension 20# 30x.   Standing scap. Retraction 30# 20x2.  L/R alterating modified stance.    Seated L shoulder PROM  (all planes) as tolerated.  Static holds at pain tolerable range.     PATIENT EDUCATION: Education details: Reviewed HEP Person educated: Patient and Spouse Education method: Medical illustrator Education comprehension: verbalized understanding and returned demonstration  HOME EXERCISE PROGRAM: Access Code: [email or web address] URL: [email or web address] Date: 11/06/2023 Prepared by: Ozell Sero  Exercises - Seated Scapular  Retraction  - 1 x daily - 7 x weekly - 2 sets - 10 reps - Standing Isometric Shoulder External Rotation with Doorway  - 1 x daily - 7 x weekly - 2 sets - 10 reps - 5-10sec hold - Isometric Shoulder Abduction at Wall  - 1 x daily - 7 x weekly - 2 sets - 10 reps - 5-10 sec hold - Isometric Shoulder Flexion at Wall  - 1 x daily - 7 x weekly - 2 sets - 10 reps - 5-10 sec hold   Access Code: [email or web address] URL: [email or web address] Date: 11/18/2023 Prepared by: Ozell Sero  Exercises - Seated Scapular Retraction  - 1 x daily - 7 x weekly - 2 sets - 10 reps - Supine Shoulder External Rotation AAROM with Dowel  - 1 x daily - 7 x weekly - 3 sets - 10 reps - Supine Shoulder Flexion Overhead with Dowel  - 1 x daily - 7 x weekly - 3 sets - 10 reps - Supine Shoulder Press with Dowel  - 1 x daily - 7 x weekly - 3 sets - 10 reps - Supine Shoulder Abduction AAROM with Dowel  - 1 x daily - 7 x weekly - 3 sets - 10 reps - Supine Shoulder Flexion AAROM with Dowel  - 1 x daily - 7 x weekly - 3 sets - 10 reps  Access Code: [email or web address] URL: [email or web address] Date:  12/02/2023 Prepared by: Ozell Sero  Exercises - Supine Shoulder External Rotation AAROM with Dowel  - 1 x daily - 5 x weekly - 3 sets - 10 reps - Supine Shoulder Flexion Overhead with Dowel  - 1 x daily - 5 x weekly - 3 sets - 10 reps - Supine Shoulder Press with Dowel  - 1 x daily - 5 x weekly - 3 sets - 10 reps - Supine Shoulder Abduction AAROM with Dowel  - 1 x daily - 5 x weekly - 3 sets - 10 reps - Supine Shoulder Flexion AAROM with Dowel  - 1 x daily - 5 x weekly - 3 sets - 10 reps - Shoulder extension with resistance - Neutral  - 1 x daily - 5 x weekly - 3 sets - 10 reps - Standing Tricep Extensions with Resistance  - 1 x daily - 5 x weekly - 3 sets - 10 reps - Standing Single Arm Bicep Curls Supinated with Dumbbell  - 1 x daily - 5 x weekly - 3 sets - 10 reps  ASSESSMENT:  CLINICAL IMPRESSION:  PT tx. Focused on progressing L shoulder A/AROM and strengthening in pain tolerable range to progress functional reaching/ ADLs.  Pts. balance challenged without use of UE assist to steady pt.  Pt. Remains motivated to increase L shoulder motor control and improve LE strength/ balance with daily walking.  Pt. will remain compliant with HEP/ daily use of UE and SPC with gait.  Pt. Requires CGA/cuing for consistent recip. Gait pattern.  No change to HEP.  Pt. Will benefit from skilled PT services to increase L shoulder ROM/ strength to promote return to PLOF/ mobility with SPC.       OBJECTIVE IMPAIRMENTS: decreased activity tolerance, decreased endurance, decreased ROM, decreased strength, hypomobility, impaired flexibility, impaired sensation, improper body mechanics, postural dysfunction, and pain.   ACTIVITY LIMITATIONS: carrying, lifting, bathing, toileting, dressing, self feeding, reach over head, and hygiene/grooming  PARTICIPATION LIMITATIONS: meal prep, cleaning, laundry, and shopping  PERSONAL FACTORS: Fitness and Past/current experiences are also affecting patient's functional  outcome.   REHAB POTENTIAL: Good  CLINICAL DECISION MAKING: Stable/uncomplicated  EVALUATION COMPLEXITY: Moderate   GOALS: Goals reviewed with patient? Yes  LONG TERM GOALS: Target date: 05/31/24  Pt. Able to complete HEP with no increase c/o L shoulder pain to improve L sh. Joint mobility/ stability.   Baseline: 5/12: minimal c/o L shoulder pain/ soreness Goal status: Partially met  2.  Pt. Will increase L shoulder AROM to >100 deg. Flexion to improve management of hair/ feeding.   Baseline: no L shoulder AROM at this time.   2/24:  pt. Challenged managing hair with compensatory movement patterns.  4/15: see above chart.  5/12: standing AAROM with isometric hold at 115 deg.  AROM <90 deg. Goal status: Not met  3.  Pt. Will increase LEFS to >45 out of 80 to improve safety with walking/ daily functional tasks.    Baseline: 30 out of 80. Goal status: Initial  4.  Pt. Will be able to dress/ groom independently with no shoulder pain/ limitations.    Baseline: limited with use of L shoulder during dressing.  5/12: limited with L shoulder IR behind back and bring L hand to head.    Goal status: Partially met  5.  Pt. Will increase Berg balance test to >45 out of 56 to decrease fall risk/ improve safety with walking/ prevent falls.   Baseline: 40 out of 56.   Goal status:  Initial   PLAN:  PT FREQUENCY: 1-2x/week  PT DURATION: 6 weeks  PLANNED INTERVENTIONS: 97110-Therapeutic exercises, 97530- Therapeutic activity, [id number]- Neuromuscular re-education, 97535- Self Care, [id number]- Manual therapy, Patient/Family education, Joint mobilization, DME instructions, Cryotherapy, and Moist heat  PLAN FOR NEXT SESSION:  Progress nautilus exercises in standing positions.  Ozell JAYSON Sero, PT, DPT # [phone] Physical Therapist - [facility] 05/20/2024, 12:42 PM

## 2024-05-24 ENCOUNTER — Ambulatory Visit: Attending: Orthopedic Surgery | Admitting: Physical Therapy

## 2024-05-24 ENCOUNTER — Encounter: Payer: Self-pay | Admitting: Physical Therapy

## 2024-05-24 DIAGNOSIS — R2689 Other abnormalities of gait and mobility: Secondary | ICD-10-CM | POA: Diagnosis present

## 2024-05-24 DIAGNOSIS — M25612 Stiffness of left shoulder, not elsewhere classified: Secondary | ICD-10-CM | POA: Insufficient documentation

## 2024-05-24 DIAGNOSIS — R269 Unspecified abnormalities of gait and mobility: Secondary | ICD-10-CM | POA: Insufficient documentation

## 2024-05-24 DIAGNOSIS — M6281 Muscle weakness (generalized): Secondary | ICD-10-CM | POA: Diagnosis present

## 2024-05-24 DIAGNOSIS — M25512 Pain in left shoulder: Secondary | ICD-10-CM | POA: Insufficient documentation

## 2024-05-24 NOTE — Therapy (Signed)
 OUTPATIENT PHYSICAL THERAPY SHOULDER and BALANCE TREATMENT  Patient Name: Evelyn Cisneros MRN: [id number] DOB:13-Jun-1941, 83 y.o., female Today's Date: 05/24/2024  END OF SESSION:  PT End of Session - 05/24/24 1231     Visit Number 37    Number of Visits 43    Date for PT Re-Evaluation 05/31/24    PT Start Time 1105    PT Stop Time 1152    PT Time Calculation (min) 47 min         Past Medical History:  Diagnosis Date   Anemia    Arthritis    Heart murmur    Hypertension    Hypothyroidism    Spondylolisthesis of lumbar region    Past Surgical History:  Procedure Laterality Date   ABDOMINAL HYSTERECTOMY     REPLACEMENT TOTAL KNEE BILATERAL     SPINE SURGERY     Patient Active Problem List   Diagnosis Date Noted   Spondylolisthesis of lumbar region 05/04/2018   Abnormal glucose level 02/12/2018   Abnormal weight gain 02/12/2018   Allergic rhinitis 02/12/2018   Asthma 02/12/2018   Bradycardia 02/12/2018   Edema 02/12/2018   Heart murmur 02/12/2018   Hypercalcemia 02/12/2018   Hypokalemia 02/12/2018   Pure hypercholesterolemia 02/12/2018   Shoulder joint pain 02/12/2018   Skin sensation disturbance 02/12/2018   Syncope 02/12/2018   Vitamin D  deficiency 02/12/2018   Central cervical cord injury, 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, 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) 04/24/2017   Muscle spasticity 04/24/2017   Weight loss 12/19/2016   Hypothyroidism 11/24/2016   Bilateral leg edema 11/24/2016   Cervical radiculopathy 11/24/2016   Disc degeneration, lumbar 11/24/2016   Essential hypertension 11/24/2016   Gallstones 11/24/2016   History of anxiety state 11/24/2016   Hyperlipidemia, unspecified 11/24/2016   Spinal stenosis of lumbar region with neurogenic claudication 11/08/2016   Anxiety 06/08/2015   PCP: Ashley Boby LABOR, MD  REFERRING PROVIDER: Teresa Beryl CROME, PA-C  REFERRING DIAG: [phone] (ICD-10-CM) - Anterior dislocation of left humerus   THERAPY DIAG:  Post-traumatic stiffness of  left shoulder joint  Acute pain of left shoulder  Muscle weakness (generalized)  Gait difficulty  Balance disorder  Rationale for Evaluation and Treatment: Rehabilitation  ONSET DATE: 10/20/23  SUBJECTIVE:                                                                                                                                                                                      SUBJECTIVE STATEMENT: Pt. Reports falling while pushing shopping cart in parking lot at [place].  Pt. Hit head resulting in lacerations/ stitches and L anterior shoulder dislocation.  Pt. Reports 0/10 L shoulder pain at rest and  2/10 pain with eating/brushing teeth.  Pt. Was using SPC on L prior to fall but limited due to shoulder sling.  Pt. Entered PT in w/c today due to inability to use L UE at this time.  Hand dominance: Left  PERTINENT HISTORY: Subjective   Evelyn Cisneros is a 83 y.o. female in today for: HPI: History of Present Illness  Patient here today for suture removal. She underwent lac repair of her forehead after a fall on concrete 12/30. Repaired 2 lacs with 2 interrupted sutures and 6 running locked sutures.   Today, patient reports healing well. She has had a nodule come up on her left wrist. It is mildly tender to palpation, does not bother her otherwise.   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 90 tablet 3  atorvastatin  (LIPITOR) 40 MG tablet TAKE 1 TABLET(40 MG) BY MOUTH EVERY DAY 90 tablet 3  biotin 5 mg Tab Take by mouth  busPIRone (BUSPAR) 5 MG tablet Take 1 tablet (5 mg total) by mouth 2 (two) times daily as needed 30 tablet 5  calcium  lactate 100 mg calcium  Tab Take by mouth  cholecalciferol , vitamin D3, (VITAMIN D3) 125 mcg (5,000 unit) Tab Take 1 tablet by mouth every morning 90 tablet 3  co-enzyme Q-10, ubiquinone, 200 mg capsule Take 200 mg by mouth every morning.   cyanocobalamin (VITAMIN B12) 1,000 mcg SL tablet Take by mouth  ferrous sulfate  325 (65 FE) MG tablet Take 1 tablet (325 mg total) by mouth twice a week 90 tablet 3  FUROsemide  (LASIX ) 20 MG tablet Take 1-2 tablets (20-40 mg total) by mouth once daily as needed for Edema 60 tablet 4  gabapentin  (NEURONTIN ) 300 MG capsule TAKE ONE CAPSULE BY MOUTH THREE TIMES DAILY TO FOUR TIMES DAILY AS NEEDED FOR PAIN 360 capsule 3  Lactobacillus acidophilus (PROBIOTIC ORAL) Take by mouth once daily  levothyroxine  (SYNTHROID ) 50 MCG tablet Take 1.5 tablets (75 mcg total) by mouth once daily 135 tablet 3  lidocaine  (LIDODERM ) 5 % patch as needed  losartan  (COZAAR ) 100 MG tablet TAKE 1 TABLET(100 MG) BY MOUTH EVERY DAY 90 tablet 3  MAGNESIUM ORAL Take by mouth  VIT C/VIT E AC/LUT/COPPER/ZINC  (PRESERVISION LUTEIN ORAL) Take 1 tablet by mouth every morning. Last dose one week prior to procedure  ZINC  ORAL Take 50 mg by mouth once daily   No facility-administered medications prior to visit.   Objective   Vitals:  10/30/23 1350  PainSc: 0-No pain   There is no height or weight on file to calculate BMI. Home Vitals:  Physical Exam Constitutional:  General: She is not in acute distress. Appearance: Normal appearance.  HENT:  Head: Normocephalic and atraumatic.  Eyes:  General: No scleral icterus. Extraocular Movements: Extraocular movements intact.   Conjunctiva/sclera: Conjunctivae normal.  Cardiovascular:  Rate and Rhythm: Normal rate and regular rhythm.  Pulmonary:  Effort: Pulmonary  effort is normal. No respiratory distress.  Breath sounds: Normal breath sounds.  Musculoskeletal:  General: Normal range of motion.  Cervical back: Normal range of motion and neck supple.  Comments: Head lacerations with good interval healing. All sutures visualized for removal.   Left hand with significant bruising from PIPs to upper wrist, interval resolution present. There is a cystic lesion, nonmobile, mildly tender to manipulation present in her posterior central wrist.  Skin: General: Skin is warm and dry.  Coloration: Skin is not jaundiced.  Findings: No erythema or rash.  Neurological:  General: No focal deficit present.  Mental Status: She is alert.  Cranial Nerves: No cranial nerve deficit.  Coordination: Coordination normal.   Assessment & Plan  Diagnoses and all orders for this visit:  Visit for suture removal - Running central scalp suture and 2x interrupted suture of right scalp removed without difficulty. Area clean with good interval healing to wound.  - Recommend gentle washing with soap and water. Avoid aggressive scrubbing. Keep area clean and dry. Reviewed warning s/s for wound healing or new onset infection requiring further evaluation.  Wrist lesion - Possible ganglion cyst vs healing blister pocket, difficult to evaluate fully with overall bruising and injury from her recent fall. Reviewed possible diagnoses. If pain, worsening swelling, numbness/tingling or movement limitations recommend earlier evaluation. Otherwise recommend evaluation by orthopedics at her follow up if symptoms do not resolve with the rest of her injuries.   PAIN:  Are you having pain? Yes: NPRS scale: 2/10 Pain location: L shoulder Pain description: sharp/aching Aggravating factors: movement/ brushing teeth Relieving factors: rest/ use of  sling  PRECAUTIONS: Shoulder  RED FLAGS: None   WEIGHT BEARING RESTRICTIONS: No  FALLS:  Has patient fallen in last 6 months? Yes. Number of falls 2  LIVING ENVIRONMENT: Lives with: lives with their spouse Lives in: House/apartment Has following equipment at home: Single point cane and Wheelchair (manual)  OCCUPATION: Retired  PLOF: Independent with household mobility with device  PATIENT GOALS:  Increase L shoulder AROM/ strength/ pain-free mobility.    NEXT MD VISIT: 11/14/23 with Dr. Gini (ortho MD)  OBJECTIVE:  Note: Objective measures were completed at Evaluation unless otherwise noted.  DIAGNOSTIC FINDINGS:  See imaging  PATIENT SURVEYS:  FOTO initial 32/ goal 38   COGNITION: Overall cognitive status: Within functional limits for tasks assessed     SENSATION: WFL.  Moderate L hand bruising.    POSTURE: Rounded shoulders/ forward posture.  Use of L shoulder sling.    UPPER EXTREMITY ROM:  Full tear of R supraspinatus reported.    Passive ROM Right Eval AROM Left Eval PROM  Shoulder flexion 74 deg. 53 deg.  Shoulder extension    Shoulder abduction 70 deg. 42 deg.  Shoulder adduction    Shoulder internal rotation  [facility]  Shoulder external rotation  0 deg.  Elbow flexion [facility] WFL  Elbow extension [facility] WFL  Wrist flexion    Wrist extension    Wrist ulnar deviation    Wrist radial deviation    Wrist pronation    Wrist supination    (Blank rows = not tested)  UPPER EXTREMITY MMT: No MMT testing today.    SHOULDER SPECIAL TESTS: NT  JOINT MOBILITY TESTING:  No L shoulder joint mobs. Secondary to anterior dislocation.    PALPATION:  Generalized L hand tenderness/ bruising.    4/15:  Passive ROM Right seated AROM Left seated PROM  Shoulder flexion 82 deg. 95 deg.  Shoulder extension  Shoulder abduction 74 deg. 88 deg.  Shoulder adduction    Shoulder internal rotation [facility]  Shoulder external rotation 30 deg. 0 deg.  Elbow flexion  [facility] WFL  Elbow extension [facility]  Wrist flexion    Wrist extension    Wrist ulnar deviation    Wrist radial deviation    Wrist pronation    Wrist supination    (Blank rows = not tested)    Passive ROM Right seated AROM Left seated P/AROM  Shoulder flexion 84 deg. 128/48 deg.  Shoulder extension    Shoulder abduction 78 deg. 124/88 deg.  Shoulder adduction    Shoulder internal rotation [facility]  Shoulder external rotation 30 deg. 8 deg.  Elbow flexion [facility] WFL  Elbow extension [facility] WFL  Wrist flexion    Wrist extension    Wrist ulnar deviation    Wrist radial deviation    Wrist pronation    Wrist supination    (Blank rows = not tested)    Gait assessment:  R antalgic gait with use of SPC.  Limited hip/knee flexion and step length with step to gait pattern.  No arm swing, esp. On R.  1 episode of shuffling R foot on floor resulting in a self-corrected balance check.    B LE MMT: R/L hip flexion (3/4), abduction (4/4), knee extension (4/4+), knee flexion (4+/4+).  Moderate R lower lower leg pitting edema (discussed benefits of compression stockings).    5xSTS:  19.0 sec./ 15.9 sec.   Berg:  40/56 (fall risk).    LEFS:  30 out of 80.                                                                           TREATMENT DATE: 05/24/2024  Subjective: Pt. Entered PT with no new complaints and states she was active over the weekend. Pt. Planning to go to medical supply store in [place] to get lower leg compression socks and knee brace.  Pt. Presents with significant lower leg swelling and PT discussed pts. Use of diuretics.  Limited use of consistent diuretics secondary to increase frequency of bathroom use.    There.act.:  Nustep L3 10 min. B UE/LE (no rest breaks)- consistent cadence.    Standing 6 step touches (alternating) with 1 UE assist on handrail.  Pt. Able to complete 2nd step alt. Touches but requires B UE on handrails for safety/ assist.  Discussed HEP  Sit to stands  from gray chair without UE assist.  Extra time with initial stand.    Seated L shoulder flexion AAROM with isometric holds and controlled eccentric lowering 5x2.    Nautilus: AAROM/min. Assist with all movements (wand).     Standing lat. pull down 30# 30x. Standing tricep extension 20# 30x.   Standing scap. Retraction 30# 20x2.  L/R alterating modified stance.    Seated L shoulder PROM  (all planes) as tolerated.  Static holds at pain tolerable range.     PATIENT EDUCATION: Education details: Reviewed HEP Person educated: Patient and Spouse Education method: Medical illustrator Education comprehension: verbalized understanding and returned demonstration  HOME EXERCISE PROGRAM: Access Code: [email or web address] URL: [email or web address] Date: 11/06/2023 Prepared by: Ozell Sero  Exercises -  Seated Scapular Retraction  - 1 x daily - 7 x weekly - 2 sets - 10 reps - Standing Isometric Shoulder External Rotation with Doorway  - 1 x daily - 7 x weekly - 2 sets - 10 reps - 5-10sec hold - Isometric Shoulder Abduction at Wall  - 1 x daily - 7 x weekly - 2 sets - 10 reps - 5-10 sec hold - Isometric Shoulder Flexion at Wall  - 1 x daily - 7 x weekly - 2 sets - 10 reps - 5-10 sec hold   Access Code: [email or web address] URL: [email or web address] Date: 11/18/2023 Prepared by: Ozell Sero  Exercises - Seated Scapular Retraction  - 1 x daily - 7 x weekly - 2 sets - 10 reps - Supine Shoulder External Rotation AAROM with Dowel  - 1 x daily - 7 x weekly - 3 sets - 10 reps - Supine Shoulder Flexion Overhead with Dowel  - 1 x daily - 7 x weekly - 3 sets - 10 reps - Supine Shoulder Press with Dowel  - 1 x daily - 7 x weekly - 3 sets - 10 reps - Supine Shoulder Abduction AAROM with Dowel  - 1 x daily - 7 x weekly - 3 sets - 10 reps - Supine Shoulder Flexion AAROM with Dowel  - 1 x daily - 7 x weekly - 3 sets - 10 reps  Access Code: [email or web address] URL:  [email or web address] Date: 12/02/2023 Prepared by: Ozell Sero  Exercises - Supine Shoulder External Rotation AAROM with Dowel  - 1 x daily - 5 x weekly - 3 sets - 10 reps - Supine Shoulder Flexion Overhead with Dowel  - 1 x daily - 5 x weekly - 3 sets - 10 reps - Supine Shoulder Press with Dowel  - 1 x daily - 5 x weekly - 3 sets - 10 reps - Supine Shoulder Abduction AAROM with Dowel  - 1 x daily - 5 x weekly - 3 sets - 10 reps - Supine Shoulder Flexion AAROM with Dowel  - 1 x daily - 5 x weekly - 3 sets - 10 reps - Shoulder extension with resistance - Neutral  - 1 x daily - 5 x weekly - 3 sets - 10 reps - Standing Tricep Extensions with Resistance  - 1 x daily - 5 x weekly - 3 sets - 10 reps - Standing Single Arm Bicep Curls Supinated with Dumbbell  - 1 x daily - 5 x weekly - 3 sets - 10 reps  ASSESSMENT:  CLINICAL IMPRESSION:  PT tx. Focused on progressing L shoulder A/AROM and strengthening in pain tolerable range to progress functional reaching/ ADLs.  Pts. balance challenged without use of UE assist to steady pt.   Pt. Requires UE assist on stair handrails with step touches/ ups.  Pt. will remain compliant with HEP/ daily use of UE and SPC with gait.  Pt. Will benefit from use of lower leg compression stockings to manage swelling.  Pt. Will benefit from skilled PT services to increase L shoulder ROM/ strength to promote return to PLOF/ mobility with SPC.       OBJECTIVE IMPAIRMENTS: decreased activity tolerance, decreased endurance, decreased ROM, decreased strength, hypomobility, impaired flexibility, impaired sensation, improper body mechanics, postural dysfunction, and pain.   ACTIVITY LIMITATIONS: carrying, lifting, bathing, toileting, dressing, self feeding, reach over head, and hygiene/grooming  PARTICIPATION LIMITATIONS: meal prep, cleaning, laundry, and shopping  PERSONAL FACTORS: Fitness and Past/current experiences are also affecting patient's functional  outcome.  REHAB POTENTIAL: Good  CLINICAL DECISION MAKING: Stable/uncomplicated  EVALUATION COMPLEXITY: Moderate   GOALS: Goals reviewed with patient? Yes  LONG TERM GOALS: Target date: 05/31/24  Pt. Able to complete HEP with no increase c/o L shoulder pain to improve L sh. Joint mobility/ stability.   Baseline: 5/12: minimal c/o L shoulder pain/ soreness Goal status: Partially met  2.  Pt. Will increase L shoulder AROM to >100 deg. Flexion to improve management of hair/ feeding.   Baseline: no L shoulder AROM at this time.   2/24:  pt. Challenged managing hair with compensatory movement patterns.  4/15: see above chart.  5/12: standing AAROM with isometric hold at 115 deg.  AROM <90 deg. Goal status: Not met  3.  Pt. Will increase LEFS to >45 out of 80 to improve safety with walking/ daily functional tasks.    Baseline: 30 out of 80. Goal status: Initial  4.  Pt. Will be able to dress/ groom independently with no shoulder pain/ limitations.    Baseline: limited with use of L shoulder during dressing.  5/12: limited with L shoulder IR behind back and bring L hand to head.    Goal status: Partially met  5.  Pt. Will increase Berg balance test to >45 out of 56 to decrease fall risk/ improve safety with walking/ prevent falls.   Baseline: 40 out of 56.   Goal status:  Initial   PLAN:  PT FREQUENCY: 1-2x/week  PT DURATION: 6 weeks  PLANNED INTERVENTIONS: 97110-Therapeutic exercises, 97530- Therapeutic activity, [id number]- Neuromuscular re-education, 97535- Self Care, [id number]- Manual therapy, Patient/Family education, Joint mobilization, DME instructions, Cryotherapy, and Moist heat  PLAN FOR NEXT SESSION:  Progress nautilus exercises in standing positions.  Ozell JAYSON Sero, PT, DPT # [id number] Physical Therapist - [facility] 05/24/2024, 12:33 PM

## 2024-05-26 ENCOUNTER — Ambulatory Visit: Admitting: Physical Therapy

## 2024-05-26 ENCOUNTER — Encounter: Payer: Self-pay | Admitting: Physical Therapy

## 2024-05-26 DIAGNOSIS — M25612 Stiffness of left shoulder, not elsewhere classified: Secondary | ICD-10-CM | POA: Diagnosis not present

## 2024-05-26 DIAGNOSIS — M6281 Muscle weakness (generalized): Secondary | ICD-10-CM

## 2024-05-26 DIAGNOSIS — R269 Unspecified abnormalities of gait and mobility: Secondary | ICD-10-CM

## 2024-05-26 DIAGNOSIS — R2689 Other abnormalities of gait and mobility: Secondary | ICD-10-CM

## 2024-05-26 DIAGNOSIS — M25512 Pain in left shoulder: Secondary | ICD-10-CM

## 2024-05-26 NOTE — Therapy (Signed)
 OUTPATIENT PHYSICAL THERAPY SHOULDER and BALANCE TREATMENT  Patient Name: Evelyn Cisneros MRN: [id number] DOB:April 28, 1941, 83 y.o., female Today's Date: 05/26/2024  END OF SESSION:  PT End of Session - 05/26/24 1110     Visit Number 38    Number of Visits 43    Date for PT Re-Evaluation 05/31/24    PT Start Time 1110    PT Stop Time 1156    PT Time Calculation (min) 46 min         Past Medical History:  Diagnosis Date   Anemia    Arthritis    Heart murmur    Hypertension    Hypothyroidism    Spondylolisthesis of lumbar region    Past Surgical History:  Procedure Laterality Date   ABDOMINAL HYSTERECTOMY     REPLACEMENT TOTAL KNEE BILATERAL     SPINE SURGERY     Patient Active Problem List   Diagnosis Date Noted   Spondylolisthesis of lumbar region 05/04/2018   Abnormal glucose level 02/12/2018   Abnormal weight gain 02/12/2018   Allergic rhinitis 02/12/2018   Asthma 02/12/2018   Bradycardia 02/12/2018   Edema 02/12/2018   Heart murmur 02/12/2018   Hypercalcemia 02/12/2018   Hypokalemia 02/12/2018   Pure hypercholesterolemia 02/12/2018   Shoulder joint pain 02/12/2018   Skin sensation disturbance 02/12/2018   Syncope 02/12/2018   Vitamin D  deficiency 02/12/2018   Central cervical cord injury, 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, 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) 04/24/2017   Muscle spasticity 04/24/2017   Weight loss 12/19/2016   Hypothyroidism 11/24/2016   Bilateral leg edema 11/24/2016   Cervical radiculopathy 11/24/2016   Disc degeneration, lumbar 11/24/2016   Essential hypertension 11/24/2016   Gallstones 11/24/2016   History of anxiety state 11/24/2016   Hyperlipidemia, unspecified 11/24/2016   Spinal stenosis of lumbar region with neurogenic claudication 11/08/2016   Anxiety 06/08/2015   PCP: Ashley Boby LABOR, MD  REFERRING PROVIDER: Teresa Beryl CROME, PA-C  REFERRING DIAG: [phone] (ICD-10-CM) - Anterior dislocation of left humerus   THERAPY DIAG:  Post-traumatic stiffness of  left shoulder joint  Acute pain of left shoulder  Muscle weakness (generalized)  Gait difficulty  Balance disorder  Rationale for Evaluation and Treatment: Rehabilitation  ONSET DATE: 10/20/23  SUBJECTIVE:                                                                                                                                                                                      SUBJECTIVE STATEMENT: Pt. Reports falling while pushing shopping cart in parking lot at [place].  Pt. Hit head resulting in lacerations/ stitches and L anterior shoulder dislocation.  Pt. Reports 0/10 L shoulder pain at rest and  2/10 pain with eating/brushing teeth.  Pt. Was using SPC on L prior to fall but limited due to shoulder sling.  Pt. Entered PT in w/c today due to inability to use L UE at this time.  Hand dominance: Left  PERTINENT HISTORY: Subjective   Evelyn Cisneros is a 83 y.o. female in today for: HPI: History of Present Illness  Patient here today for suture removal. She underwent lac repair of her forehead after a fall on concrete 12/30. Repaired 2 lacs with 2 interrupted sutures and 6 running locked sutures.   Today, patient reports healing well. She has had a nodule come up on her left wrist. It is mildly tender to palpation, does not bother her otherwise.   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 90 tablet 3  atorvastatin  (LIPITOR) 40 MG tablet TAKE 1 TABLET(40 MG) BY MOUTH EVERY DAY 90 tablet 3  biotin 5 mg Tab Take by mouth  busPIRone (BUSPAR) 5 MG tablet Take 1 tablet (5 mg total) by mouth 2 (two) times daily as needed 30 tablet 5  calcium  lactate 100 mg calcium  Tab Take by mouth  cholecalciferol , vitamin D3, (VITAMIN D3) 125 mcg (5,000 unit) Tab Take 1 tablet by mouth every morning 90 tablet 3  co-enzyme Q-10, ubiquinone, 200 mg capsule Take 200 mg by mouth every morning.   cyanocobalamin (VITAMIN B12) 1,000 mcg SL tablet Take by mouth  ferrous sulfate  325 (65 FE) MG tablet Take 1 tablet (325 mg total) by mouth twice a week 90 tablet 3  FUROsemide  (LASIX ) 20 MG tablet Take 1-2 tablets (20-40 mg total) by mouth once daily as needed for Edema 60 tablet 4  gabapentin  (NEURONTIN ) 300 MG capsule TAKE ONE CAPSULE BY MOUTH THREE TIMES DAILY TO FOUR TIMES DAILY AS NEEDED FOR PAIN 360 capsule 3  Lactobacillus acidophilus (PROBIOTIC ORAL) Take by mouth once daily  levothyroxine  (SYNTHROID ) 50 MCG tablet Take 1.5 tablets (75 mcg total) by mouth once daily 135 tablet 3  lidocaine  (LIDODERM ) 5 % patch as needed  losartan  (COZAAR ) 100 MG tablet TAKE 1 TABLET(100 MG) BY MOUTH EVERY DAY 90 tablet 3  MAGNESIUM ORAL Take by mouth  VIT C/VIT E AC/LUT/COPPER/ZINC  (PRESERVISION LUTEIN ORAL) Take 1 tablet by mouth every morning. Last dose one week prior to procedure  ZINC  ORAL Take 50 mg by mouth once daily   No facility-administered medications prior to visit.   Objective   Vitals:  10/30/23 1350  PainSc: 0-No pain   There is no height or weight on file to calculate BMI. Home Vitals:  Physical Exam Constitutional:  General: She is not in acute distress. Appearance: Normal appearance.  HENT:  Head: Normocephalic and atraumatic.  Eyes:  General: No scleral icterus. Extraocular Movements: Extraocular movements intact.   Conjunctiva/sclera: Conjunctivae normal.  Cardiovascular:  Rate and Rhythm: Normal rate and regular rhythm.  Pulmonary:  Effort: Pulmonary  effort is normal. No respiratory distress.  Breath sounds: Normal breath sounds.  Musculoskeletal:  General: Normal range of motion.  Cervical back: Normal range of motion and neck supple.  Comments: Head lacerations with good interval healing. All sutures visualized for removal.   Left hand with significant bruising from PIPs to upper wrist, interval resolution present. There is a cystic lesion, nonmobile, mildly tender to manipulation present in her posterior central wrist.  Skin: General: Skin is warm and dry.  Coloration: Skin is not jaundiced.  Findings: No erythema or rash.  Neurological:  General: No focal deficit present.  Mental Status: She is alert.  Cranial Nerves: No cranial nerve deficit.  Coordination: Coordination normal.   Assessment & Plan  Diagnoses and all orders for this visit:  Visit for suture removal - Running central scalp suture and 2x interrupted suture of right scalp removed without difficulty. Area clean with good interval healing to wound.  - Recommend gentle washing with soap and water. Avoid aggressive scrubbing. Keep area clean and dry. Reviewed warning s/s for wound healing or new onset infection requiring further evaluation.  Wrist lesion - Possible ganglion cyst vs healing blister pocket, difficult to evaluate fully with overall bruising and injury from her recent fall. Reviewed possible diagnoses. If pain, worsening swelling, numbness/tingling or movement limitations recommend earlier evaluation. Otherwise recommend evaluation by orthopedics at her follow up if symptoms do not resolve with the rest of her injuries.   PAIN:  Are you having pain? Yes: NPRS scale: 2/10 Pain location: L shoulder Pain description: sharp/aching Aggravating factors: movement/ brushing teeth Relieving factors: rest/ use of  sling  PRECAUTIONS: Shoulder  RED FLAGS: None   WEIGHT BEARING RESTRICTIONS: No  FALLS:  Has patient fallen in last 6 months? Yes. Number of falls 2  LIVING ENVIRONMENT: Lives with: lives with their spouse Lives in: House/apartment Has following equipment at home: Single point cane and Wheelchair (manual)  OCCUPATION: Retired  PLOF: Independent with household mobility with device  PATIENT GOALS:  Increase L shoulder AROM/ strength/ pain-free mobility.    NEXT MD VISIT: 11/14/23 with Dr. Gini (ortho MD)  OBJECTIVE:  Note: Objective measures were completed at Evaluation unless otherwise noted.  DIAGNOSTIC FINDINGS:  See imaging  PATIENT SURVEYS:  FOTO initial 32/ goal 58   COGNITION: Overall cognitive status: Within functional limits for tasks assessed     SENSATION: WFL.  Moderate L hand bruising.    POSTURE: Rounded shoulders/ forward posture.  Use of L shoulder sling.    UPPER EXTREMITY ROM:  Full tear of R supraspinatus reported.    Passive ROM Right Eval AROM Left Eval PROM  Shoulder flexion 74 deg. 53 deg.  Shoulder extension    Shoulder abduction 70 deg. 42 deg.  Shoulder adduction    Shoulder internal rotation  [facility]  Shoulder external rotation  0 deg.  Elbow flexion [facility] WFL  Elbow extension [facility] WFL  Wrist flexion    Wrist extension    Wrist ulnar deviation    Wrist radial deviation    Wrist pronation    Wrist supination    (Blank rows = not tested)  UPPER EXTREMITY MMT: No MMT testing today.    SHOULDER SPECIAL TESTS: NT  JOINT MOBILITY TESTING:  No L shoulder joint mobs. Secondary to anterior dislocation.    PALPATION:  Generalized L hand tenderness/ bruising.    4/15:  Passive ROM Right seated AROM Left seated PROM  Shoulder flexion 82 deg. 95 deg.  Shoulder extension  Shoulder abduction 74 deg. 88 deg.  Shoulder adduction    Shoulder internal rotation [facility]  Shoulder external rotation 30 deg. 0 deg.  Elbow flexion  [facility] WFL  Elbow extension [facility]  Wrist flexion    Wrist extension    Wrist ulnar deviation    Wrist radial deviation    Wrist pronation    Wrist supination    (Blank rows = not tested)   Passive ROM Right seated AROM Left seated P/AROM  Shoulder flexion 84 deg. 128/48 deg.  Shoulder extension    Shoulder abduction 78 deg. 124/88 deg.  Shoulder adduction    Shoulder internal rotation [facility]  Shoulder external rotation 30 deg. 8 deg.  Elbow flexion [facility] WFL  Elbow extension [facility] WFL  Wrist flexion    Wrist extension    Wrist ulnar deviation    Wrist radial deviation    Wrist pronation    Wrist supination    (Blank rows = not tested)    Gait assessment:  R antalgic gait with use of SPC.  Limited hip/knee flexion and step length with step to gait pattern.  No arm swing, esp. On R.  1 episode of shuffling R foot on floor resulting in a self-corrected balance check.    B LE MMT: R/L hip flexion (3/4), abduction (4/4), knee extension (4/4+), knee flexion (4+/4+).  Moderate R lower lower leg pitting edema (discussed benefits of compression stockings).    5xSTS:  19.0 sec./ 15.9 sec.   Berg:  40/56 (fall risk).    LEFS:  30 out of 80.                                                                           TREATMENT DATE: 05/26/2024  Subjective: Pt. Fell Monday afternoon outside at home and presents with L trunk/rib soreness and pain.  Pt. States she turned to assess a loud noise and when she turned back she lost her balance and hit the L side of face/ ribs.  Pt. Reports 3/10 L sided pain in ribs.  No pain on L side of face but bruising noted.  Pt. Put ice on areas to manage pain/ inflammation.    There.act.:  Seated shoulder pulley: flexion/ abduction 20x.  Discussed recent fall.    Walking in //-bars with no UE assist/ turning at end of //-bars with no UE.  5 laps with SBA for safety.  No increase c/o pain.     Seated L shoulder flexion AAROM with isometric holds and  controlled eccentric lowering 5x2.    Nautilus: AAROM/min. Assist with all movements (wand).     Standing lat. pull down 30# 30x. Standing tricep extension 20# 30x.   Standing scap. Retraction 30# 20x2.  L/R alterating modified stance.    Seated L shoulder PROM  (all planes) as tolerated.  Static holds at pain tolerable range.    STM to B UT/ L posterior deltoid region after tx.  No palpation over L sided ribs due to reported soreness/ discomfort.  Pt. Has no difficulty taking a deep breathe.    No Nustep or UBE today secondary to L side/rib soreness and pain.    PATIENT EDUCATION: Education details: Reviewed  HEP Person educated: Patient and Spouse Education method: Medical illustrator Education comprehension: verbalized understanding and returned demonstration  HOME EXERCISE PROGRAM: Access Code: [email or web address] URL: [email or web address] Date: 11/06/2023 Prepared by: Ozell Sero  Exercises - Seated Scapular Retraction  - 1 x daily - 7 x weekly - 2 sets - 10 reps - Standing Isometric Shoulder External Rotation with Doorway  - 1 x daily - 7 x weekly - 2 sets - 10 reps - 5-10sec hold - Isometric Shoulder Abduction at Wall  - 1 x daily - 7 x weekly - 2 sets - 10 reps - 5-10 sec hold - Isometric Shoulder Flexion at Wall  - 1 x daily - 7 x weekly - 2 sets - 10 reps - 5-10 sec hold   Access Code: [email or web address] URL: [email or web address] Date: 11/18/2023 Prepared by: Ozell Sero  Exercises - Seated Scapular Retraction  - 1 x daily - 7 x weekly - 2 sets - 10 reps - Supine Shoulder External Rotation AAROM with Dowel  - 1 x daily - 7 x weekly - 3 sets - 10 reps - Supine Shoulder Flexion Overhead with Dowel  - 1 x daily - 7 x weekly - 3 sets - 10 reps - Supine Shoulder Press with Dowel  - 1 x daily - 7 x weekly - 3 sets - 10 reps - Supine Shoulder Abduction AAROM with Dowel  - 1 x daily - 7 x weekly - 3 sets - 10 reps - Supine Shoulder Flexion AAROM with  Dowel  - 1 x daily - 7 x weekly - 3 sets - 10 reps  Access Code: [email or web address] URL: [email or web address] Date: 12/02/2023 Prepared by: Ozell Sero  Exercises - Supine Shoulder External Rotation AAROM with Dowel  - 1 x daily - 5 x weekly - 3 sets - 10 reps - Supine Shoulder Flexion Overhead with Dowel  - 1 x daily - 5 x weekly - 3 sets - 10 reps - Supine Shoulder Press with Dowel  - 1 x daily - 5 x weekly - 3 sets - 10 reps - Supine Shoulder Abduction AAROM with Dowel  - 1 x daily - 5 x weekly - 3 sets - 10 reps - Supine Shoulder Flexion AAROM with Dowel  - 1 x daily - 5 x weekly - 3 sets - 10 reps - Shoulder extension with resistance - Neutral  - 1 x daily - 5 x weekly - 3 sets - 10 reps - Standing Tricep Extensions with Resistance  - 1 x daily - 5 x weekly - 3 sets - 10 reps - Standing Single Arm Bicep Curls Supinated with Dumbbell  - 1 x daily - 5 x weekly - 3 sets - 10 reps  ASSESSMENT:  CLINICAL IMPRESSION:  PT tx. Focused on progressing L shoulder A/AROM and strengthening in pain tolerable range to progress functional reaching/ ADLs.  Tx. Limited today due to recent fall with L side/rib soreness/pain/bruising.  Pt. Works hard with L UE ROM/ strengthening ex. In a pain tolerable range.  Pt. will remain compliant with HEP/ daily use of UE and SPC with gait.  Pt. Will benefit from use of lower leg compression stockings to manage swelling.  Pt. Will benefit from skilled PT services to increase L shoulder ROM/ strength to promote return to PLOF/ mobility with SPC.       OBJECTIVE IMPAIRMENTS: decreased activity tolerance, decreased endurance, decreased ROM, decreased strength, hypomobility, impaired flexibility, impaired sensation, improper body mechanics, postural dysfunction, and pain.  ACTIVITY LIMITATIONS: carrying, lifting, bathing, toileting, dressing, self feeding, reach over head, and hygiene/grooming  PARTICIPATION LIMITATIONS: meal prep, cleaning, laundry, and  shopping  PERSONAL FACTORS: Fitness and Past/current experiences are also affecting patient's functional outcome.   REHAB POTENTIAL: Good  CLINICAL DECISION MAKING: Stable/uncomplicated  EVALUATION COMPLEXITY: Moderate   GOALS: Goals reviewed with patient? Yes  LONG TERM GOALS: Target date: 05/31/24  Pt. Able to complete HEP with no increase c/o L shoulder pain to improve L sh. Joint mobility/ stability.   Baseline: 5/12: minimal c/o L shoulder pain/ soreness Goal status: Partially met  2.  Pt. Will increase L shoulder AROM to >100 deg. Flexion to improve management of hair/ feeding.   Baseline: no L shoulder AROM at this time.   2/24:  pt. Challenged managing hair with compensatory movement patterns.  4/15: see above chart.  5/12: standing AAROM with isometric hold at 115 deg.  AROM <90 deg. Goal status: Not met  3.  Pt. Will increase LEFS to >45 out of 80 to improve safety with walking/ daily functional tasks.    Baseline: 30 out of 80. Goal status: Initial  4.  Pt. Will be able to dress/ groom independently with no shoulder pain/ limitations.    Baseline: limited with use of L shoulder during dressing.  5/12: limited with L shoulder IR behind back and bring L hand to head.    Goal status: Partially met  5.  Pt. Will increase Berg balance test to >45 out of 56 to decrease fall risk/ improve safety with walking/ prevent falls.   Baseline: 40 out of 56.   Goal status:  Initial   PLAN:  PT FREQUENCY: 1-2x/week  PT DURATION: 6 weeks  PLANNED INTERVENTIONS: 97110-Therapeutic exercises, 97530- Therapeutic activity, [id number]- Neuromuscular re-education, 97535- Self Care, [id number]- Manual therapy, Patient/Family education, Joint mobilization, DME instructions, Cryotherapy, and Moist heat  PLAN FOR NEXT SESSION:  Progress nautilus exercises in standing positions.  Ozell JAYSON Sero, PT, DPT # [phone] Physical Therapist - [facility] 05/26/2024, 12:41 PM

## 2024-05-31 ENCOUNTER — Encounter: Payer: Self-pay | Admitting: Physical Therapy

## 2024-05-31 ENCOUNTER — Ambulatory Visit: Admitting: Physical Therapy

## 2024-05-31 DIAGNOSIS — M25512 Pain in left shoulder: Secondary | ICD-10-CM

## 2024-05-31 DIAGNOSIS — R269 Unspecified abnormalities of gait and mobility: Secondary | ICD-10-CM

## 2024-05-31 DIAGNOSIS — M25612 Stiffness of left shoulder, not elsewhere classified: Secondary | ICD-10-CM | POA: Diagnosis not present

## 2024-05-31 DIAGNOSIS — M6281 Muscle weakness (generalized): Secondary | ICD-10-CM

## 2024-05-31 DIAGNOSIS — R2689 Other abnormalities of gait and mobility: Secondary | ICD-10-CM

## 2024-05-31 NOTE — Therapy (Signed)
 OUTPATIENT PHYSICAL THERAPY SHOULDER and BALANCE TREATMENT  Patient Name: Evelyn Cisneros MRN: [id number] DOB:[birth year], 83 y.o., female Today's Date: 06/01/2024  END OF SESSION:  PT End of Session - 05/31/24 1054     Visit Number 39    Number of Visits 43    Date for PT Re-Evaluation 05/31/24    PT Start Time 1107    PT Stop Time 1200    PT Time Calculation (min) 53 min         Past Medical History:  Diagnosis Date   Anemia    Arthritis    Heart murmur    Hypertension    Hypothyroidism    Spondylolisthesis of lumbar region    Past Surgical History:  Procedure Laterality Date   ABDOMINAL HYSTERECTOMY     REPLACEMENT TOTAL KNEE BILATERAL     SPINE SURGERY     Patient Active Problem List   Diagnosis Date Noted   Spondylolisthesis of lumbar region 05/04/2018   Abnormal glucose level 02/12/2018   Abnormal weight gain 02/12/2018   Allergic rhinitis 02/12/2018   Asthma 02/12/2018   Bradycardia 02/12/2018   Edema 02/12/2018   Heart murmur 02/12/2018   Hypercalcemia 02/12/2018   Hypokalemia 02/12/2018   Pure hypercholesterolemia 02/12/2018   Shoulder joint pain 02/12/2018   Skin sensation disturbance 02/12/2018   Syncope 02/12/2018   Vitamin D  deficiency 02/12/2018   Central cervical cord injury, 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, 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) 04/24/2017   Muscle spasticity 04/24/2017   Weight loss 12/19/2016   Hypothyroidism 11/24/2016   Bilateral leg edema 11/24/2016   Cervical radiculopathy 11/24/2016   Disc degeneration, lumbar 11/24/2016   Essential hypertension 11/24/2016   Gallstones 11/24/2016   History of anxiety state 11/24/2016   Hyperlipidemia, unspecified 11/24/2016   Spinal stenosis of lumbar region with neurogenic claudication 11/08/2016   Anxiety 06/08/2015   PCP: Ashley Boby LABOR, MD  REFERRING PROVIDER: Teresa Beryl CROME, PA-C  REFERRING DIAG: [phone] (ICD-10-CM) - Anterior dislocation of left humerus   THERAPY DIAG:  Post-traumatic stiffness of  left shoulder joint  Acute pain of left shoulder  Muscle weakness (generalized)  Gait difficulty  Balance disorder  Rationale for Evaluation and Treatment: Rehabilitation  ONSET DATE: 10/20/23  SUBJECTIVE:                                                                                                                                                                                      SUBJECTIVE STATEMENT: Pt. Reports falling while pushing shopping cart in parking lot at [place].  Pt. Hit head resulting in lacerations/ stitches and L anterior shoulder dislocation.  Pt. Reports 0/10 L shoulder pain at rest and  2/10 pain with eating/brushing teeth.  Pt. Was using SPC on L prior to fall but limited due to shoulder sling.  Pt. Entered PT in w/c today due to inability to use L UE at this time.  Hand dominance: Left  PERTINENT HISTORY: Subjective   Evelyn Cisneros is a 83 y.o. female in today for: HPI: History of Present Illness  Patient here today for suture removal. She underwent lac repair of her forehead after a fall on concrete 12/30. Repaired 2 lacs with 2 interrupted sutures and 6 running locked sutures.   Today, patient reports healing well. She has had a nodule come up on her left wrist. It is mildly tender to palpation, does not bother her otherwise.   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 90 tablet 3  atorvastatin  (LIPITOR) 40 MG tablet TAKE 1 TABLET(40 MG) BY MOUTH EVERY DAY 90 tablet 3  biotin 5 mg Tab Take by mouth  busPIRone (BUSPAR) 5 MG tablet Take 1 tablet (5 mg total) by mouth 2 (two) times daily as needed 30 tablet 5  calcium  lactate 100 mg calcium  Tab Take by mouth  cholecalciferol , vitamin D3, (VITAMIN D3) 125 mcg (5,000 unit) Tab Take 1 tablet by mouth every morning 90 tablet 3  co-enzyme Q-10, ubiquinone, 200 mg capsule Take 200 mg by mouth every morning.   cyanocobalamin (VITAMIN B12) 1,000 mcg SL tablet Take by mouth  ferrous sulfate  325 (65 FE) MG tablet Take 1 tablet (325 mg total) by mouth twice a week 90 tablet 3  FUROsemide  (LASIX ) 20 MG tablet Take 1-2 tablets (20-40 mg total) by mouth once daily as needed for Edema 60 tablet 4  gabapentin  (NEURONTIN ) 300 MG capsule TAKE ONE CAPSULE BY MOUTH THREE TIMES DAILY TO FOUR TIMES DAILY AS NEEDED FOR PAIN 360 capsule 3  Lactobacillus acidophilus (PROBIOTIC ORAL) Take by mouth once daily  levothyroxine  (SYNTHROID ) 50 MCG tablet Take 1.5 tablets (75 mcg total) by mouth once daily 135 tablet 3  lidocaine  (LIDODERM ) 5 % patch as needed  losartan  (COZAAR ) 100 MG tablet TAKE 1 TABLET(100 MG) BY MOUTH EVERY DAY 90 tablet 3  MAGNESIUM ORAL Take by mouth  VIT C/VIT E AC/LUT/COPPER/ZINC  (PRESERVISION LUTEIN ORAL) Take 1 tablet by mouth every morning. Last dose one week prior to procedure  ZINC  ORAL Take 50 mg by mouth once daily   No facility-administered medications prior to visit.   Objective   Vitals:  10/30/23 1350  PainSc: 0-No pain   There is no height or weight on file to calculate BMI. Home Vitals:  Physical Exam Constitutional:  General: She is not in acute distress. Appearance: Normal appearance.  HENT:  Head: Normocephalic and atraumatic.  Eyes:  General: No scleral icterus. Extraocular Movements: Extraocular movements intact.   Conjunctiva/sclera: Conjunctivae normal.  Cardiovascular:  Rate and Rhythm: Normal rate and regular rhythm.  Pulmonary:  Effort: Pulmonary  effort is normal. No respiratory distress.  Breath sounds: Normal breath sounds.  Musculoskeletal:  General: Normal range of motion.  Cervical back: Normal range of motion and neck supple.  Comments: Head lacerations with good interval healing. All sutures visualized for removal.   Left hand with significant bruising from PIPs to upper wrist, interval resolution present. There is a cystic lesion, nonmobile, mildly tender to manipulation present in her posterior central wrist.  Skin: General: Skin is warm and dry.  Coloration: Skin is not jaundiced.  Findings: No erythema or rash.  Neurological:  General: No focal deficit present.  Mental Status: She is alert.  Cranial Nerves: No cranial nerve deficit.  Coordination: Coordination normal.   Assessment & Plan  Diagnoses and all orders for this visit:  Visit for suture removal - Running central scalp suture and 2x interrupted suture of right scalp removed without difficulty. Area clean with good interval healing to wound.  - Recommend gentle washing with soap and water. Avoid aggressive scrubbing. Keep area clean and dry. Reviewed warning s/s for wound healing or new onset infection requiring further evaluation.  Wrist lesion - Possible ganglion cyst vs healing blister pocket, difficult to evaluate fully with overall bruising and injury from her recent fall. Reviewed possible diagnoses. If pain, worsening swelling, numbness/tingling or movement limitations recommend earlier evaluation. Otherwise recommend evaluation by orthopedics at her follow up if symptoms do not resolve with the rest of her injuries.   PAIN:  Are you having pain? Yes: NPRS scale: 2/10 Pain location: L shoulder Pain description: sharp/aching Aggravating factors: movement/ brushing teeth Relieving factors: rest/ use of  sling  PRECAUTIONS: Shoulder  RED FLAGS: None   WEIGHT BEARING RESTRICTIONS: No  FALLS:  Has patient fallen in last 6 months? Yes. Number of falls 2  LIVING ENVIRONMENT: Lives with: lives with their spouse Lives in: House/apartment Has following equipment at home: Single point cane and Wheelchair (manual)  OCCUPATION: Retired  PLOF: Independent with household mobility with device  PATIENT GOALS:  Increase L shoulder AROM/ strength/ pain-free mobility.    NEXT MD VISIT: 11/14/23 with Dr. Gini (ortho MD)  OBJECTIVE:  Note: Objective measures were completed at Evaluation unless otherwise noted.  DIAGNOSTIC FINDINGS:  See imaging  PATIENT SURVEYS:  FOTO initial 32/ goal 47   COGNITION: Overall cognitive status: Within functional limits for tasks assessed     SENSATION: WFL.  Moderate L hand bruising.    POSTURE: Rounded shoulders/ forward posture.  Use of L shoulder sling.    UPPER EXTREMITY ROM:  Full tear of R supraspinatus reported.    Passive ROM Right Eval AROM Left Eval PROM  Shoulder flexion 74 deg. 53 deg.  Shoulder extension    Shoulder abduction 70 deg. 42 deg.  Shoulder adduction    Shoulder internal rotation  [facility]  Shoulder external rotation  0 deg.  Elbow flexion [facility] WFL  Elbow extension [facility] WFL  Wrist flexion    Wrist extension    Wrist ulnar deviation    Wrist radial deviation    Wrist pronation    Wrist supination    (Blank rows = not tested)  UPPER EXTREMITY MMT: No MMT testing today.    SHOULDER SPECIAL TESTS: NT  JOINT MOBILITY TESTING:  No L shoulder joint mobs. Secondary to anterior dislocation.    PALPATION:  Generalized L hand tenderness/ bruising.    4/15:  Passive ROM Right seated AROM Left seated PROM  Shoulder flexion 82 deg. 95 deg.  Shoulder extension  Shoulder abduction 74 deg. 88 deg.  Shoulder adduction    Shoulder internal rotation [facility]  Shoulder external rotation 30 deg. 0 deg.  Elbow flexion  [facility] WFL  Elbow extension [facility]  Wrist flexion    Wrist extension    Wrist ulnar deviation    Wrist radial deviation    Wrist pronation    Wrist supination    (Blank rows = not tested)   Passive ROM Right seated AROM Left seated P/AROM  Shoulder flexion 84 deg. 128/48 deg.  Shoulder extension    Shoulder abduction 78 deg. 124/88 deg.  Shoulder adduction    Shoulder internal rotation [facility]  Shoulder external rotation 30 deg. 8 deg.  Elbow flexion [facility] WFL  Elbow extension [facility] WFL  Wrist flexion    Wrist extension    Wrist ulnar deviation    Wrist radial deviation    Wrist pronation    Wrist supination    (Blank rows = not tested)    Gait assessment:  R antalgic gait with use of SPC.  Limited hip/knee flexion and step length with step to gait pattern.  No arm swing, esp. On R.  1 episode of shuffling R foot on floor resulting in a self-corrected balance check.    B LE MMT: R/L hip flexion (3/4), abduction (4/4), knee extension (4/4+), knee flexion (4+/4+).  Moderate R lower lower leg pitting edema (discussed benefits of compression stockings).    5xSTS:  19.0 sec./ 15.9 sec.   Berg:  40/56 (fall risk).    LEFS:  30 out of 80.                                                                           TREATMENT DATE: 06/01/2024  Subjective:  Pt. States her legs were really tired during a long walk into [facility] for a MD appt. after PT tx. Session last Wednesday, resulting in a fall to ground.  Pt. Presents with R collar bone palpable pain and ecchymosis in R anterior chest.  Pt. Has continued L side/rib/face ecchymosis since fall at home last Monday.  Pt. Reports 3/10 L sided rib/ R collarbone pain.    Nustep L UE/B LE for 6 min. L3 with consistent  (No R UE assist with PT tx. Today).    There.act.:  Walking in //-bars with no UE assist/ turning at end of //-bars with no UE.  5 laps with SBA for safety.  No increase c/o pain.     Standing marching/ hip abduction/ hamstring  curls 20x.  L UE assist on //-bars.    Seated L shoulder flexion/ abduction AAROM with isometric holds and controlled eccentric lowering 5x2.    Seated L shoulder AA/PROM  (all planes) as tolerated.  Static holds at varying pain tolerable ranges.    STM to B UT/ L posterior deltoid region after tx.  No palpation over L sided ribs due to reported soreness/ discomfort.  Marked increase in R collarbone tenderness/ pain with palpation.    Supine L shoulder A/AROM with light PT assist.  Supine L shoulder manual isometrics ER/IR 5x with 5 sec. Holds.  Supine L shoulder rhythmic stabs (flexion/ extension).    Ice to  R shoulder/collarbone after tx. Session in seated posture.     NOT TODAY Nautilus: AAROM/min. Assist with all movements (wand).     Standing lat. pull down 30# 30x. Standing tricep extension 20# 30x.   Standing scap. Retraction 30# 20x2.  L/R alterating modified stance.   PATIENT EDUCATION: Education details: Reviewed HEP Person educated: Patient and Spouse Education method: Medical illustrator Education comprehension: verbalized understanding and returned demonstration  HOME EXERCISE PROGRAM: Access Code: [email or web address] URL: [email or web address] Date: 11/06/2023 Prepared by: Ozell Sero  Exercises - Seated Scapular Retraction  - 1 x daily - 7 x weekly - 2 sets - 10 reps - Standing Isometric Shoulder External Rotation with Doorway  - 1 x daily - 7 x weekly - 2 sets - 10 reps - 5-10sec hold - Isometric Shoulder Abduction at Wall  - 1 x daily - 7 x weekly - 2 sets - 10 reps - 5-10 sec hold - Isometric Shoulder Flexion at Wall  - 1 x daily - 7 x weekly - 2 sets - 10 reps - 5-10 sec hold   Access Code: [email or web address] URL: [email or web address] Date: 11/18/2023 Prepared by: Ozell Sero  Exercises - Seated Scapular Retraction  - 1 x daily - 7 x weekly - 2 sets - 10 reps - Supine Shoulder External Rotation AAROM with Dowel  - 1 x daily - 7 x  weekly - 3 sets - 10 reps - Supine Shoulder Flexion Overhead with Dowel  - 1 x daily - 7 x weekly - 3 sets - 10 reps - Supine Shoulder Press with Dowel  - 1 x daily - 7 x weekly - 3 sets - 10 reps - Supine Shoulder Abduction AAROM with Dowel  - 1 x daily - 7 x weekly - 3 sets - 10 reps - Supine Shoulder Flexion AAROM with Dowel  - 1 x daily - 7 x weekly - 3 sets - 10 reps  Access Code: [email or web address] URL: [email or web address] Date: 12/02/2023 Prepared by: Ozell Sero  Exercises - Supine Shoulder External Rotation AAROM with Dowel  - 1 x daily - 5 x weekly - 3 sets - 10 reps - Supine Shoulder Flexion Overhead with Dowel  - 1 x daily - 5 x weekly - 3 sets - 10 reps - Supine Shoulder Press with Dowel  - 1 x daily - 5 x weekly - 3 sets - 10 reps - Supine Shoulder Abduction AAROM with Dowel  - 1 x daily - 5 x weekly - 3 sets - 10 reps - Supine Shoulder Flexion AAROM with Dowel  - 1 x daily - 5 x weekly - 3 sets - 10 reps - Shoulder extension with resistance - Neutral  - 1 x daily - 5 x weekly - 3 sets - 10 reps - Standing Tricep Extensions with Resistance  - 1 x daily - 5 x weekly - 3 sets - 10 reps - Standing Single Arm Bicep Curls Supinated with Dumbbell  - 1 x daily - 5 x weekly - 3 sets - 10 reps  ASSESSMENT:  CLINICAL IMPRESSION:  PT tx. Focused on progressing L shoulder A/AROM and strengthening in pain tolerable range to progress functional reaching/ ADLs.  Tx. Limited today due to recent fall with L side/rib soreness/pain/bruising and R collarbone/shoulder pain.   No resisted tasks with use of R UE today.  Pt. Works hard with L UE ROM/ strengthening ex. In a pain tolerable range.  Pt. will remain compliant with HEP/ daily use of UE and  SPC with gait.  Pt. Will benefit from use of lower leg compression stockings to manage swelling.  Pt. Will benefit from skilled PT services to increase L shoulder ROM/ strength to promote return to PLOF/ mobility with SPC.       OBJECTIVE  IMPAIRMENTS: decreased activity tolerance, decreased endurance, decreased ROM, decreased strength, hypomobility, impaired flexibility, impaired sensation, improper body mechanics, postural dysfunction, and pain.   ACTIVITY LIMITATIONS: carrying, lifting, bathing, toileting, dressing, self feeding, reach over head, and hygiene/grooming  PARTICIPATION LIMITATIONS: meal prep, cleaning, laundry, and shopping  PERSONAL FACTORS: Fitness and Past/current experiences are also affecting patient's functional outcome.   REHAB POTENTIAL: Good  CLINICAL DECISION MAKING: Stable/uncomplicated  EVALUATION COMPLEXITY: Moderate   GOALS: Goals reviewed with patient? Yes  LONG TERM GOALS: Target date: 05/31/24  Pt. Able to complete HEP with no increase c/o L shoulder pain to improve L sh. Joint mobility/ stability.   Baseline: 5/12: minimal c/o L shoulder pain/ soreness Goal status: Partially met  2.  Pt. Will increase L shoulder AROM to >100 deg. Flexion to improve management of hair/ feeding.   Baseline: no L shoulder AROM at this time.   2/24:  pt. Challenged managing hair with compensatory movement patterns.  4/15: see above chart.  5/12: standing AAROM with isometric hold at 115 deg.  AROM <90 deg. Goal status: Not met  3.  Pt. Will increase LEFS to >45 out of 80 to improve safety with walking/ daily functional tasks.    Baseline: 30 out of 80. Goal status: On-going  4.  Pt. Will be able to dress/ groom independently with no shoulder pain/ limitations.    Baseline: limited with use of L shoulder during dressing.  5/12: limited with L shoulder IR behind back and bring L hand to head.    Goal status: Partially met  5.  Pt. Will increase Berg balance test to >45 out of 56 to decrease fall risk/ improve safety with walking/ prevent falls.   Baseline: 40 out of 56.   Goal status:  Initial   PLAN:  PT FREQUENCY: 1-2x/week  PT DURATION: 6 weeks  PLANNED INTERVENTIONS: 97110-Therapeutic  exercises, 97530- Therapeutic activity, [id number]- Neuromuscular re-education, 97535- Self Care, [id number]- Manual therapy, Patient/Family education, Joint mobilization, DME instructions, Cryotherapy, and Moist heat  PLAN FOR NEXT SESSION:  Progress nautilus exercises in standing positions.  10th visit progress note/ RECERT next tx.  Recheck R shoulder/ collarbone  Ozell JAYSON Sero, PT, DPT # [phone] Physical Therapist - [facility] 06/01/2024, 7:14 AM

## 2024-06-02 ENCOUNTER — Ambulatory Visit: Admitting: Physical Therapy

## 2024-06-02 ENCOUNTER — Encounter: Payer: Self-pay | Admitting: Physical Therapy

## 2024-06-02 DIAGNOSIS — R2689 Other abnormalities of gait and mobility: Secondary | ICD-10-CM

## 2024-06-02 DIAGNOSIS — M25612 Stiffness of left shoulder, not elsewhere classified: Secondary | ICD-10-CM

## 2024-06-02 DIAGNOSIS — M25512 Pain in left shoulder: Secondary | ICD-10-CM

## 2024-06-02 DIAGNOSIS — M6281 Muscle weakness (generalized): Secondary | ICD-10-CM

## 2024-06-02 DIAGNOSIS — R269 Unspecified abnormalities of gait and mobility: Secondary | ICD-10-CM

## 2024-06-02 NOTE — Therapy (Signed)
 OUTPATIENT PHYSICAL THERAPY SHOULDER and BALANCE TREATMENT/ RECERTIFICATION Physical Therapy Progress Note  Dates of reporting period  04/19/24   to  06/02/24   Patient Name: Evelyn Cisneros MRN: [id number] DOB:1941/02/01, 83 y.o., female Today's Date: 06/02/2024  END OF SESSION:  PT End of Session - 06/02/24 1104     Visit Number 40    Number of Visits 52    Date for PT Re-Evaluation 07/14/24    PT Start Time 1104    PT Stop Time 1202    PT Time Calculation (min) 58 min         Past Medical History:  Diagnosis Date   Anemia    Arthritis    Heart murmur    Hypertension    Hypothyroidism    Spondylolisthesis of lumbar region    Past Surgical History:  Procedure Laterality Date   ABDOMINAL HYSTERECTOMY     REPLACEMENT TOTAL KNEE BILATERAL     SPINE SURGERY     Patient Active Problem List   Diagnosis Date Noted   Spondylolisthesis of lumbar region 05/04/2018   Abnormal glucose level 02/12/2018   Abnormal weight gain 02/12/2018   Allergic rhinitis 02/12/2018   Asthma 02/12/2018   Bradycardia 02/12/2018   Edema 02/12/2018   Heart murmur 02/12/2018   Hypercalcemia 02/12/2018   Hypokalemia 02/12/2018   Pure hypercholesterolemia 02/12/2018   Shoulder joint pain 02/12/2018   Skin sensation disturbance 02/12/2018   Syncope 02/12/2018   Vitamin D  deficiency 02/12/2018   Central cervical cord injury, 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, 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) 04/24/2017   Muscle spasticity 04/24/2017   Weight loss 12/19/2016   Hypothyroidism 11/24/2016   Bilateral leg edema 11/24/2016   Cervical radiculopathy 11/24/2016   Disc degeneration, lumbar 11/24/2016   Essential hypertension 11/24/2016   Gallstones 11/24/2016   History of anxiety state 11/24/2016   Hyperlipidemia, unspecified 11/24/2016   Spinal stenosis of lumbar region with neurogenic claudication 11/08/2016   Anxiety 06/08/2015   PCP: Ashley Boby LABOR, MD  REFERRING PROVIDER: Teresa Beryl CROME, PA-C  REFERRING DIAG:  [id number] (ICD-10-CM) - Anterior dislocation of left humerus   THERAPY DIAG:  Post-traumatic stiffness of left shoulder joint  Acute pain of left shoulder  Muscle weakness (generalized)  Gait difficulty  Balance disorder  Rationale for Evaluation and Treatment: Rehabilitation  ONSET DATE: 10/20/23  SUBJECTIVE:                                                                                                                                                                                      SUBJECTIVE STATEMENT: Pt. Reports falling while pushing shopping cart in parking lot at [place].  Pt. Hit head resulting  in lacerations/ stitches and L anterior shoulder dislocation.  Pt. Reports 0/10 L shoulder pain at rest and 2/10 pain with eating/brushing teeth.  Pt. Was using SPC on L prior to fall but limited due to shoulder sling.  Pt. Entered PT in w/c today due to inability to use L UE at this time.  Hand dominance: Left  PERTINENT HISTORY: Subjective   Evelyn Cisneros is a 83 y.o. female in today for: HPI: History of Present Illness  Patient here today for suture removal. She underwent lac repair of her forehead after a fall on concrete 12/30. Repaired 2 lacs with 2 interrupted sutures and 6 running locked sutures.   Today, patient reports healing well. She has had a nodule come up on her left wrist. It is mildly tender to palpation, does not bother her otherwise.   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 90 tablet 3  atorvastatin  (LIPITOR) 40 MG tablet TAKE 1 TABLET(40 MG) BY MOUTH EVERY DAY 90 tablet 3  biotin 5 mg Tab Take by mouth  busPIRone (BUSPAR) 5 MG tablet Take 1 tablet (5 mg total) by mouth 2 (two) times daily as needed 30 tablet 5  calcium  lactate 100 mg calcium  Tab Take by mouth  cholecalciferol , vitamin D3, (VITAMIN D3) 125 mcg (5,000 unit) Tab Take 1 tablet by mouth every morning 90 tablet 3  co-enzyme Q-10, ubiquinone, 200 mg capsule Take 200 mg by mouth every morning.   cyanocobalamin (VITAMIN B12) 1,000 mcg SL tablet Take by mouth  ferrous sulfate  325 (65 FE) MG tablet Take 1 tablet (325 mg total) by mouth twice a week 90 tablet 3  FUROsemide  (LASIX ) 20 MG tablet Take 1-2 tablets (20-40 mg total) by mouth once daily as needed for Edema 60 tablet 4  gabapentin  (NEURONTIN ) 300 MG capsule TAKE ONE CAPSULE BY MOUTH THREE TIMES DAILY TO FOUR TIMES DAILY AS NEEDED FOR PAIN 360 capsule 3  Lactobacillus acidophilus (PROBIOTIC ORAL) Take by mouth once daily  levothyroxine  (SYNTHROID ) 50 MCG tablet Take 1.5 tablets (75 mcg total) by mouth once daily 135 tablet 3  lidocaine  (LIDODERM ) 5 % patch as needed  losartan  (COZAAR ) 100 MG tablet TAKE 1 TABLET(100 MG) BY MOUTH EVERY DAY 90 tablet 3  MAGNESIUM ORAL Take by mouth  VIT C/VIT E AC/LUT/COPPER/ZINC  (PRESERVISION LUTEIN ORAL) Take 1 tablet by mouth every morning. Last dose one week prior to procedure  ZINC  ORAL Take 50 mg by mouth once daily   No facility-administered medications prior to visit.   Objective   Vitals:  10/30/23 1350  PainSc: 0-No pain   There is no height or weight on file to calculate BMI. Home Vitals:  Physical Exam Constitutional:  General: She is not in acute distress. Appearance: Normal appearance.  HENT:  Head: Normocephalic  and atraumatic.  Eyes:  General: No scleral icterus. Extraocular Movements: Extraocular movements intact.  Conjunctiva/sclera:  Conjunctivae normal.  Cardiovascular:  Rate and Rhythm: Normal rate and regular rhythm.  Pulmonary:  Effort: Pulmonary effort is normal. No respiratory distress.  Breath sounds: Normal breath sounds.  Musculoskeletal:  General: Normal range of motion.  Cervical back: Normal range of motion and neck supple.  Comments: Head lacerations with good interval healing. All sutures visualized for removal.   Left hand with significant bruising from PIPs to upper wrist, interval resolution present. There is a cystic lesion, nonmobile, mildly tender to manipulation present in her posterior central wrist.  Skin: General: Skin is warm and dry.  Coloration: Skin is not jaundiced.  Findings: No erythema or rash.  Neurological:  General: No focal deficit present.  Mental Status: She is alert.  Cranial Nerves: No cranial nerve deficit.  Coordination: Coordination normal.   Assessment & Plan  Diagnoses and all orders for this visit:  Visit for suture removal - Running central scalp suture and 2x interrupted suture of right scalp removed without difficulty. Area clean with good interval healing to wound.  - Recommend gentle washing with soap and water. Avoid aggressive scrubbing. Keep area clean and dry. Reviewed warning s/s for wound healing or new onset infection requiring further evaluation.  Wrist lesion - Possible ganglion cyst vs healing blister pocket, difficult to evaluate fully with overall bruising and injury from her recent fall. Reviewed possible diagnoses. If pain, worsening swelling, numbness/tingling or movement limitations recommend earlier evaluation. Otherwise recommend evaluation by orthopedics at her follow up if symptoms do not resolve with the rest of her injuries.   PAIN:  Are you having pain? Yes: NPRS scale: 2/10 Pain location: L shoulder Pain  description: sharp/aching Aggravating factors: movement/ brushing teeth Relieving factors: rest/ use of sling  PRECAUTIONS: Shoulder  RED FLAGS: None   WEIGHT BEARING RESTRICTIONS: No  FALLS:  Has patient fallen in last 6 months? Yes. Number of falls 2  LIVING ENVIRONMENT: Lives with: lives with their spouse Lives in: House/apartment Has following equipment at home: Single point cane and Wheelchair (manual)  OCCUPATION: Retired  PLOF: Independent with household mobility with device  PATIENT GOALS:  Increase L shoulder AROM/ strength/ pain-free mobility.    NEXT MD VISIT: 11/14/23 with Dr. Gini (ortho MD)  OBJECTIVE:  Note: Objective measures were completed at Evaluation unless otherwise noted.  DIAGNOSTIC FINDINGS:  See imaging  PATIENT SURVEYS:  FOTO initial 32/ goal 14   COGNITION: Overall cognitive status: Within functional limits for tasks assessed     SENSATION: WFL.  Moderate L hand bruising.    POSTURE: Rounded shoulders/ forward posture.  Use of L shoulder sling.    UPPER EXTREMITY ROM:  Full tear of R supraspinatus reported.    Passive ROM Right Eval AROM Left Eval PROM  Shoulder flexion 74 deg. 53 deg.  Shoulder extension    Shoulder abduction 70 deg. 42 deg.  Shoulder adduction    Shoulder internal rotation  [facility]  Shoulder external rotation  0 deg.  Elbow flexion [facility] WFL  Elbow extension [facility] WFL  Wrist flexion    Wrist extension    Wrist ulnar deviation    Wrist radial deviation    Wrist pronation    Wrist supination    (Blank rows = not tested)  UPPER EXTREMITY MMT: No MMT testing today.    SHOULDER SPECIAL TESTS: NT  JOINT MOBILITY TESTING:  No L shoulder joint mobs. Secondary to anterior dislocation.    PALPATION:  Generalized L hand tenderness/ bruising.    4/15:  Passive ROM Right seated AROM Left seated PROM  Shoulder flexion 82 deg. 95 deg.  Shoulder extension    Shoulder abduction 74 deg. 88 deg.  Shoulder  adduction    Shoulder internal rotation [facility]  Shoulder external rotation 30 deg. 0 deg.  Elbow flexion [facility] WFL  Elbow extension [facility]  Wrist flexion    Wrist extension    Wrist ulnar deviation    Wrist radial deviation    Wrist pronation    Wrist supination    (Blank rows = not tested)   Passive ROM Right seated AROM Left seated P/AROM  Shoulder flexion 84 deg. 128/48 deg.  Shoulder extension    Shoulder abduction 78 deg. 124/88 deg.  Shoulder adduction    Shoulder internal rotation [facility]  Shoulder external rotation 30 deg. 8 deg.  Elbow flexion [facility] WFL  Elbow extension [facility] WFL  Wrist flexion    Wrist extension    Wrist ulnar deviation    Wrist radial deviation    Wrist pronation    Wrist supination    (Blank rows = not tested)    Gait assessment:  R antalgic gait with use of SPC.  Limited hip/knee flexion and step length with step to gait pattern.  No arm swing, esp. On R.  1 episode of shuffling R foot on floor resulting in a self-corrected balance check.    B LE MMT: R/L hip flexion (3/4), abduction (4/4), knee extension (4/4+), knee flexion (4+/4+).  Moderate R lower lower leg pitting edema (discussed benefits of compression stockings).    5xSTS:  19.0 sec./ 15.9 sec.   Berg:  40/56 (fall risk).    LEFS:  30 out of 80.                                                                           TREATMENT DATE: 06/02/2024  Subjective:  Pt. Reports no falls or LOB since last PT tx. Session.  Pt. Continues to c/o R shoulder/ collar bone pain and presents with ecchymosis in R anterior chest/ L side of neck.  Pt. Has continued L side/rib/face soreness since fall at home last Monday.  Pt. Reports 3/10 L sided rib/ R collarbone pain prior to tx. Session.     No Nustep today.  There.act.:  Walking in hallway with use of SPC and progressing to no assistive device for 2 laps.  SBA for safety/ cuing.  No LOB but an increase in R antalgic gait without SPC.  Sit to  stands from gray chair with focus on equal LE wt. Bearing 10x.    Seated L shoulder flexion/ abduction AAROM with isometric holds and controlled eccentric lowering 5x2.    Seated L shoulder AA/PROM  (all planes) as tolerated.  Static holds at varying pain tolerable ranges.    Seated L UE push/pull with PT resistance (moderate) with use of SPC. 15x.    No involvement with R UE due to R shoulder/ collar bone pain.    Updated goals.     NOT TODAY Nautilus: AAROM/min. Assist with all movements (wand).     Standing lat. pull down 30# 30x. Standing tricep extension 20# 30x.   Standing scap. Retraction 30#  20x2.  L/R alterating modified stance.   PATIENT EDUCATION: Education details: Reviewed HEP Person educated: Patient and Spouse Education method: Medical illustrator Education comprehension: verbalized understanding and returned demonstration  HOME EXERCISE PROGRAM: Access Code: [email or web address] URL: [email or web address] Date: 11/06/2023 Prepared by: Ozell Sero  Exercises - Seated Scapular Retraction  - 1 x daily - 7 x weekly - 2 sets - 10 reps - Standing Isometric Shoulder External Rotation with Doorway  - 1 x daily - 7 x weekly - 2 sets - 10 reps - 5-10sec hold - Isometric Shoulder Abduction at Wall  - 1 x daily - 7 x weekly - 2 sets - 10 reps - 5-10 sec hold - Isometric Shoulder Flexion at Wall  - 1 x daily - 7 x weekly - 2 sets - 10 reps - 5-10 sec hold   Access Code: [email or web address] URL: [email or web address] Date: 11/18/2023 Prepared by: Ozell Sero  Exercises - Seated Scapular Retraction  - 1 x daily - 7 x weekly - 2 sets - 10 reps - Supine Shoulder External Rotation AAROM with Dowel  - 1 x daily - 7 x weekly - 3 sets - 10 reps - Supine Shoulder Flexion Overhead with Dowel  - 1 x daily - 7 x weekly - 3 sets - 10 reps - Supine Shoulder Press with Dowel  - 1 x daily - 7 x weekly - 3 sets - 10 reps - Supine Shoulder Abduction AAROM with Dowel   - 1 x daily - 7 x weekly - 3 sets - 10 reps - Supine Shoulder Flexion AAROM with Dowel  - 1 x daily - 7 x weekly - 3 sets - 10 reps  Access Code: [email or web address] URL: [email or web address] Date: 12/02/2023 Prepared by: Ozell Sero  Exercises - Supine Shoulder External Rotation AAROM with Dowel  - 1 x daily - 5 x weekly - 3 sets - 10 reps - Supine Shoulder Flexion Overhead with Dowel  - 1 x daily - 5 x weekly - 3 sets - 10 reps - Supine Shoulder Press with Dowel  - 1 x daily - 5 x weekly - 3 sets - 10 reps - Supine Shoulder Abduction AAROM with Dowel  - 1 x daily - 5 x weekly - 3 sets - 10 reps - Supine Shoulder Flexion AAROM with Dowel  - 1 x daily - 5 x weekly - 3 sets - 10 reps - Shoulder extension with resistance - Neutral  - 1 x daily - 5 x weekly - 3 sets - 10 reps - Standing Tricep Extensions with Resistance  - 1 x daily - 5 x weekly - 3 sets - 10 reps - Standing Single Arm Bicep Curls Supinated with Dumbbell  - 1 x daily - 5 x weekly - 3 sets - 10 reps  ASSESSMENT:  CLINICAL IMPRESSION:  PT tx. Focused on progressing L shoulder A/AROM and strengthening in pain tolerable range to progress functional reaching/ ADLs.  Tx. Limited today due to recent fall with L side/rib soreness/pain/bruising and R collarbone/shoulder pain.   No resisted tasks with use of R UE today.  Pt. Works hard with L UE ROM/ strengthening ex. In a pain tolerable range.  Pt. will remain compliant with HEP/ daily use of UE and SPC with gait.  See updated goals/ recertification.   Pt. Will benefit from use of lower leg compression stockings to manage swelling.  Pt. Will benefit from skilled PT services to increase L shoulder ROM/ strength to promote return to PLOF/  mobility with SPC.       OBJECTIVE IMPAIRMENTS: decreased activity tolerance, decreased endurance, decreased ROM, decreased strength, hypomobility, impaired flexibility, impaired sensation, improper body mechanics, postural dysfunction, and pain.    ACTIVITY LIMITATIONS: carrying, lifting, bathing, toileting, dressing, self feeding, reach over head, and hygiene/grooming  PARTICIPATION LIMITATIONS: meal prep, cleaning, laundry, and shopping  PERSONAL FACTORS: Fitness and Past/current experiences are also affecting patient's functional outcome.   REHAB POTENTIAL: Good  CLINICAL DECISION MAKING: Stable/uncomplicated  EVALUATION COMPLEXITY: Moderate   GOALS: Goals reviewed with patient? Yes  LONG TERM GOALS: Target date: 07/14/24  Pt. Able to complete HEP with no increase c/o L shoulder pain to improve L sh. Joint mobility/ stability.   Baseline: 5/12: minimal c/o L shoulder pain/ soreness Goal status: Partially met  2.  Pt. Will increase L shoulder AROM to >100 deg. Flexion to improve management of hair/ feeding.   Baseline: no L shoulder AROM at this time.   2/24:  pt. Challenged managing hair with compensatory movement patterns.  4/15: see above chart.  5/12: standing AAROM with isometric hold at 115 deg.  AROM <90 deg.  8/13:  See above chart Goal status: Not met  3.  Pt. Will increase LEFS to >45 out of 80 to improve safety with walking/ daily functional tasks.    Baseline: 30 out of 80. Goal status: On-going  4.  Pt. Will be able to dress/ groom independently with no shoulder pain/ limitations.    Baseline: limited with use of L shoulder during dressing.  5/12: limited with L shoulder IR behind back and bring L hand to head.    Goal status: Partially met  5.  Pt. Will increase Berg balance test to >45 out of 56 to decrease fall risk/ improve safety with walking/ prevent falls.   Baseline: 40 out of 56.   Goal status:  On-going  PLAN:  PT FREQUENCY: 1-2x/week  PT DURATION: 6 weeks  PLANNED INTERVENTIONS: 97110-Therapeutic exercises, 97530- Therapeutic activity, [id number]- Neuromuscular re-education, 97535- Self Care, [id number]- Manual therapy, Patient/Family education, Joint mobilization, DME instructions, Cryotherapy,  and Moist heat  PLAN FOR NEXT SESSION:  Progress nautilus exercises in standing positions.  Recheck R shoulder/ collarbone  Ozell JAYSON Sero, PT, DPT # [phone] Physical Therapist - [facility] 06/02/2024, 12:58 PM

## 2024-06-07 ENCOUNTER — Ambulatory Visit: Admitting: Physical Therapy

## 2024-06-07 ENCOUNTER — Encounter: Payer: Self-pay | Admitting: Physical Therapy

## 2024-06-07 DIAGNOSIS — M25612 Stiffness of left shoulder, not elsewhere classified: Secondary | ICD-10-CM | POA: Diagnosis not present

## 2024-06-07 DIAGNOSIS — R2689 Other abnormalities of gait and mobility: Secondary | ICD-10-CM

## 2024-06-07 DIAGNOSIS — M6281 Muscle weakness (generalized): Secondary | ICD-10-CM

## 2024-06-07 DIAGNOSIS — M25512 Pain in left shoulder: Secondary | ICD-10-CM

## 2024-06-07 DIAGNOSIS — R269 Unspecified abnormalities of gait and mobility: Secondary | ICD-10-CM

## 2024-06-07 NOTE — Therapy (Addendum)
 OUTPATIENT PHYSICAL THERAPY SHOULDER and BALANCE TREATMENT  Patient Name: Evelyn Cisneros MRN: [id number] DOB:[birth year], 83 y.o., female Today's Date: 06/07/2024  END OF SESSION:  PT End of Session - 06/07/24 1105     Visit Number 41    Number of Visits 52    Date for PT Re-Evaluation 07/14/24    PT Start Time 1107    PT Stop Time 1200    PT Time Calculation (min) 53 min         Past Medical History:  Diagnosis Date   Anemia    Arthritis    Heart murmur    Hypertension    Hypothyroidism    Spondylolisthesis of lumbar region    Past Surgical History:  Procedure Laterality Date   ABDOMINAL HYSTERECTOMY     REPLACEMENT TOTAL KNEE BILATERAL     SPINE SURGERY     Patient Active Problem List   Diagnosis Date Noted   Spondylolisthesis of lumbar region 05/04/2018   Abnormal glucose level 02/12/2018   Abnormal weight gain 02/12/2018   Allergic rhinitis 02/12/2018   Asthma 02/12/2018   Bradycardia 02/12/2018   Edema 02/12/2018   Heart murmur 02/12/2018   Hypercalcemia 02/12/2018   Hypokalemia 02/12/2018   Pure hypercholesterolemia 02/12/2018   Shoulder joint pain 02/12/2018   Skin sensation disturbance 02/12/2018   Syncope 02/12/2018   Vitamin D  deficiency 02/12/2018   Central cervical cord injury, 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, 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) 04/24/2017   Muscle spasticity 04/24/2017   Weight loss 12/19/2016   Hypothyroidism 11/24/2016   Bilateral leg edema 11/24/2016   Cervical radiculopathy 11/24/2016   Disc degeneration, lumbar 11/24/2016   Essential hypertension 11/24/2016   Gallstones 11/24/2016   History of anxiety state 11/24/2016   Hyperlipidemia, unspecified 11/24/2016   Spinal stenosis of lumbar region with neurogenic claudication 11/08/2016   Anxiety 06/08/2015   PCP: Evelyn Boby LABOR, MD  REFERRING PROVIDER: Teresa Beryl CROME, PA-C  REFERRING DIAG: [id number] (ICD-10-CM) - Anterior dislocation of left humerus   THERAPY DIAG:  Post-traumatic stiffness of  left shoulder joint  Acute pain of left shoulder  Muscle weakness (generalized)  Gait difficulty  Balance disorder  Rationale for Evaluation and Treatment: Rehabilitation  ONSET DATE: 10/20/23  SUBJECTIVE:                                                                                                                                                                                      SUBJECTIVE STATEMENT: Pt. Reports falling while pushing shopping cart in parking [place] [address]  Pt. Hit head resulting in lacerations/ stitches and L anterior shoulder dislocation.  Pt. Reports 0/10 L shoulder pain at rest and  2/10 pain with eating/brushing teeth.  Pt. Was using SPC on L prior to fall but limited due to shoulder sling.  Pt. Entered PT in w/c today due to inability to use L UE at this time.  Hand dominance: Left  PERTINENT HISTORY: Subjective   Evelyn Cisneros is a 83 y.o. female in today for: HPI: History of Present Illness  Patient here today for suture removal. She underwent lac repair of her forehead after a fall on concrete 12/30. Repaired 2 lacs with 2 interrupted sutures and 6 running locked sutures.   Today, patient reports healing well. She has had a nodule come up on her left wrist. It is mildly tender to palpation, does not bother her otherwise.   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 90 tablet 3  atorvastatin  (LIPITOR) 40 MG tablet TAKE 1 TABLET(40 MG) BY MOUTH EVERY DAY 90 tablet 3  biotin 5 mg Tab Take by mouth  busPIRone (BUSPAR) 5 MG tablet Take 1 tablet (5 mg total) by mouth 2 (two) times daily as needed 30 tablet 5  calcium  lactate 100 mg calcium  Tab Take by mouth  cholecalciferol , vitamin D3, (VITAMIN D3) 125 mcg (5,000 unit) Tab Take 1 tablet by mouth every morning 90 tablet 3  co-enzyme Q-10, ubiquinone, 200 mg capsule Take 200 mg by mouth every morning.   cyanocobalamin (VITAMIN B12) 1,000 mcg SL tablet Take by mouth  ferrous sulfate  325 (65 FE) MG tablet Take 1 tablet (325 mg total) by mouth twice a week 90 tablet 3  FUROsemide  (LASIX ) 20 MG tablet Take 1-2 tablets (20-40 mg total) by mouth once daily as needed for Edema 60 tablet 4  gabapentin  (NEURONTIN ) 300 MG capsule TAKE ONE CAPSULE BY MOUTH THREE TIMES DAILY TO FOUR TIMES DAILY AS NEEDED FOR PAIN 360 capsule 3  Lactobacillus acidophilus (PROBIOTIC ORAL) Take by mouth once daily  levothyroxine  (SYNTHROID ) 50 MCG tablet Take 1.5 tablets (75 mcg total) by mouth once daily 135 tablet 3  lidocaine  (LIDODERM ) 5 % patch as needed  losartan  (COZAAR ) 100 MG tablet TAKE 1 TABLET(100 MG) BY MOUTH EVERY DAY 90 tablet 3  MAGNESIUM ORAL Take by mouth  VIT C/VIT E AC/LUT/COPPER/ZINC  (PRESERVISION LUTEIN ORAL) Take 1 tablet by mouth every morning. Last dose one week prior to procedure  ZINC  ORAL Take 50 mg by mouth once daily   No facility-administered medications prior to visit.   Objective   Vitals:  10/30/23 1350  PainSc: 0-No pain   There is no height or weight on file to calculate BMI. Home Vitals:  Physical Exam Constitutional:  General: She is not in acute distress. Appearance: Normal appearance.  HENT:  Head: Normocephalic and atraumatic.  Eyes:  General: No scleral icterus. Extraocular Movements: Extraocular movements intact.   Conjunctiva/sclera: Conjunctivae normal.  Cardiovascular:  Rate and Rhythm: Normal rate and regular rhythm.  Pulmonary:  Effort: Pulmonary  effort is normal. No respiratory distress.  Breath sounds: Normal breath sounds.  Musculoskeletal:  General: Normal range of motion.  Cervical back: Normal range of motion and neck supple.  Comments: Head lacerations with good interval healing. All sutures visualized for removal.   Left hand with significant bruising from PIPs to upper wrist, interval resolution present. There is a cystic lesion, nonmobile, mildly tender to manipulation present in her posterior central wrist.  Skin: General: Skin is warm and dry.  Coloration: Skin is not jaundiced.  Findings: No erythema or rash.  Neurological:  General: No focal deficit present.  Mental Status: She is alert.  Cranial Nerves: No cranial nerve deficit.  Coordination: Coordination normal.   Assessment & Plan  Diagnoses and all orders for this visit:  Visit for suture removal - Running central scalp suture and 2x interrupted suture of right scalp removed without difficulty. Area clean with good interval healing to wound.  - Recommend gentle washing with soap and water. Avoid aggressive scrubbing. Keep area clean and dry. Reviewed warning s/s for wound healing or new onset infection requiring further evaluation.  Wrist lesion - Possible ganglion cyst vs healing blister pocket, difficult to evaluate fully with overall bruising and injury from her recent fall. Reviewed possible diagnoses. If pain, worsening swelling, numbness/tingling or movement limitations recommend earlier evaluation. Otherwise recommend evaluation by orthopedics at her follow up if symptoms do not resolve with the rest of her injuries.   PAIN:  Are you having pain? Yes: NPRS scale: 2/10 Pain location: L shoulder Pain description: sharp/aching Aggravating factors: movement/ brushing teeth Relieving factors: rest/ use of  sling  PRECAUTIONS: Shoulder  RED FLAGS: None   WEIGHT BEARING RESTRICTIONS: No  FALLS:  Has patient fallen in last 6 months? Yes. Number of falls 2  LIVING ENVIRONMENT: Lives with: lives with their spouse Lives in: House/apartment Has following equipment at home: Single point cane and Wheelchair (manual)  OCCUPATION: Retired  PLOF: Independent with household mobility with device  PATIENT GOALS:  Increase L shoulder AROM/ strength/ pain-free mobility.    NEXT MD VISIT: 11/14/23 with Dr. Gini (ortho MD)  OBJECTIVE:  Note: Objective measures were completed at Evaluation unless otherwise noted.  DIAGNOSTIC FINDINGS:  See imaging  PATIENT SURVEYS:  FOTO initial 32/ goal 38   COGNITION: Overall cognitive status: Within functional limits for tasks assessed     SENSATION: WFL.  Moderate L hand bruising.    POSTURE: Rounded shoulders/ forward posture.  Use of L shoulder sling.    UPPER EXTREMITY ROM:  Full tear of R supraspinatus reported.    Passive ROM Right Eval AROM Left Eval PROM  Shoulder flexion 74 deg. 53 deg.  Shoulder extension    Shoulder abduction 70 deg. 42 deg.  Shoulder adduction    Shoulder internal rotation  [facility]  Shoulder external rotation  0 deg.  Elbow flexion [facility] WFL  Elbow extension [facility] WFL  Wrist flexion    Wrist extension    Wrist ulnar deviation    Wrist radial deviation    Wrist pronation    Wrist supination    (Blank rows = not tested)  UPPER EXTREMITY MMT: No MMT testing today.    SHOULDER SPECIAL TESTS: NT  JOINT MOBILITY TESTING:  No L shoulder joint mobs. Secondary to anterior dislocation.    PALPATION:  Generalized L hand tenderness/ bruising.    4/15:  Passive ROM Right seated AROM Left seated PROM  Shoulder flexion 82 deg. 95 deg.  Shoulder extension  Shoulder abduction 74 deg. 88 deg.  Shoulder adduction    Shoulder internal rotation [facility]  Shoulder external rotation 30 deg. 0 deg.  Elbow flexion  [facility] WFL  Elbow extension [facility]  Wrist flexion    Wrist extension    Wrist ulnar deviation    Wrist radial deviation    Wrist pronation    Wrist supination    (Blank rows = not tested)   Passive ROM Right seated AROM Left seated P/AROM  Shoulder flexion 84 deg. 128/48 deg.  Shoulder extension    Shoulder abduction 78 deg. 124/88 deg.  Shoulder adduction    Shoulder internal rotation [facility]  Shoulder external rotation 30 deg. 8 deg.  Elbow flexion [facility] WFL  Elbow extension [facility] WFL  Wrist flexion    Wrist extension    Wrist ulnar deviation    Wrist radial deviation    Wrist pronation    Wrist supination    (Blank rows = not tested)    Gait assessment:  R antalgic gait with use of SPC.  Limited hip/knee flexion and step length with step to gait pattern.  No arm swing, esp. On R.  1 episode of shuffling R foot on floor resulting in a self-corrected balance check.    B LE MMT: R/L hip flexion (3/4), abduction (4/4), knee extension (4/4+), knee flexion (4+/4+).  Moderate R lower lower leg pitting edema (discussed benefits of compression stockings).    5xSTS:  19.0 sec./ 15.9 sec.   Berg:  40/56 (fall risk).    LEFS:  30 out of 80.                                                                           TREATMENT DATE: 06/07/2024  Subjective:  Pt. Reports no falls or LOB since last PT tx. Session.  Decrease c/o R shoulder/ collar bone pain and ecchymosis today as compared to last week.  R shoulder remains pain limited with AAROM flexion/ abduction.   Pt. Reviewed activity limitations with PT to update treatment note and goals.    Nustep B LE/ L UE for 10 min. L3 (consistent cadence)- to improve LE muscle endurance and L UE muscle strengthening.  Discussed activity limitations with dressing/ bathing/ reaching tasks.  Pt. reports no issues with bathing/ dressing/ managing hair/ reaching to stove.  Pt. Limited with overhead reaching/ carrying tasks.  PT updated treatment note  (see below)  There.act.:  Nautilus: seated L UE lat. Pull down 30# with use of handle 25x.  Standing L UE lat. Pull downs 20x.  Standing L tricep extension 20# 15x2.    Walking in hallway with use of SPC and progressing to no assistive device.  SBA for safety/ cuing.  No LOB but an increase in R antalgic gait without SPC.  6 hurdle step overs in hallway (use of SPC and R HHA).  Pt. Steps over with L LE and challenged with catching heel on top of hurdle.  No LOB but knocked over several hurdles and maintained balance.    Sit to stands from gray chair with focus on equal LE wt. Bearing 10x.    Seated L shoulder flexion/ abduction AAROM with isometric holds and  controlled eccentric lowering 5x2.    Seated L shoulder AA/PROM  (all planes) as tolerated.  Static holds at varying pain tolerable ranges.    Seated/ standing B UE push/pull with PT resistance (moderate) with use of SPC. 15x.  Pt. Challenged in standing but able to maintain balance.    Limited involvement with R UE due to R shoulder/ collar bone pain.     PATIENT EDUCATION: Education details: Reviewed HEP Person educated: Patient and Spouse Education method: Medical illustrator Education comprehension: verbalized understanding and returned demonstration  HOME EXERCISE PROGRAM: Access Code: [email or web address] URL: [email or web address] Date: 11/06/2023 Prepared by: Ozell Sero  Exercises - Seated Scapular Retraction  - 1 x daily - 7 x weekly - 2 sets - 10 reps - Standing Isometric Shoulder External Rotation with Doorway  - 1 x daily - 7 x weekly - 2 sets - 10 reps - 5-10sec hold - Isometric Shoulder Abduction at Wall  - 1 x daily - 7 x weekly - 2 sets - 10 reps - 5-10 sec hold - Isometric Shoulder Flexion at Wall  - 1 x daily - 7 x weekly - 2 sets - 10 reps - 5-10 sec hold   Access Code: [email or web address] URL: [email or web address] Date: 11/18/2023 Prepared by: Ozell Sero  Exercises - Seated  Scapular Retraction  - 1 x daily - 7 x weekly - 2 sets - 10 reps - Supine Shoulder External Rotation AAROM with Dowel  - 1 x daily - 7 x weekly - 3 sets - 10 reps - Supine Shoulder Flexion Overhead with Dowel  - 1 x daily - 7 x weekly - 3 sets - 10 reps - Supine Shoulder Press with Dowel  - 1 x daily - 7 x weekly - 3 sets - 10 reps - Supine Shoulder Abduction AAROM with Dowel  - 1 x daily - 7 x weekly - 3 sets - 10 reps - Supine Shoulder Flexion AAROM with Dowel  - 1 x daily - 7 x weekly - 3 sets - 10 reps  Access Code: [email or web address] URL: [email or web address] Date: 12/02/2023 Prepared by: Ozell Sero  Exercises - Supine Shoulder External Rotation AAROM with Dowel  - 1 x daily - 5 x weekly - 3 sets - 10 reps - Supine Shoulder Flexion Overhead with Dowel  - 1 x daily - 5 x weekly - 3 sets - 10 reps - Supine Shoulder Press with Dowel  - 1 x daily - 5 x weekly - 3 sets - 10 reps - Supine Shoulder Abduction AAROM with Dowel  - 1 x daily - 5 x weekly - 3 sets - 10 reps - Supine Shoulder Flexion AAROM with Dowel  - 1 x daily - 5 x weekly - 3 sets - 10 reps - Shoulder extension with resistance - Neutral  - 1 x daily - 5 x weekly - 3 sets - 10 reps - Standing Tricep Extensions with Resistance  - 1 x daily - 5 x weekly - 3 sets - 10 reps - Standing Single Arm Bicep Curls Supinated with Dumbbell  - 1 x daily - 5 x weekly - 3 sets - 10 reps  ASSESSMENT:  CLINICAL IMPRESSION:  PT tx. Focused on progressing L shoulder A/AROM and strengthening in pain tolerable range to progress functional reaching/ ADLs.  Tx. Returned to L UE resisted ex. And challenged balance with hallway step over activities.  Pt. Works hard with L UE ROM/ strengthening ex. In a pain tolerable range.  No LOB  during tx. Session but CGA and use of SPC with walking tasks involving obstacles.  Pt. Will benefit from skilled PT services to increase L shoulder ROM/ strength to promote return to PLOF/ mobility with SPC.        OBJECTIVE IMPAIRMENTS: decreased activity tolerance, decreased endurance, decreased ROM, decreased strength, hypomobility, impaired flexibility, impaired sensation, improper body mechanics, postural dysfunction, and pain.   ACTIVITY LIMITATIONS: carrying, lifting, reaching overhead  PARTICIPATION LIMITATIONS: meal prep, cleaning, laundry, and shopping  PERSONAL FACTORS: Fitness and Past/current experiences are also affecting patient's functional outcome.   REHAB POTENTIAL: Good  CLINICAL DECISION MAKING: Stable/uncomplicated  EVALUATION COMPLEXITY: Moderate   GOALS: Goals reviewed with patient? Yes  LONG TERM GOALS: Target date: 07/14/24  Pt. Able to complete HEP with no increase c/o L shoulder pain to improve L sh. Joint mobility/ stability.   Baseline: 5/12: minimal c/o L shoulder pain/ soreness Goal status: Partially met  2.  Pt. Will increase L shoulder AROM to >100 deg. Flexion to improve management of hair/ feeding.   Baseline: no L shoulder AROM at this time.   2/24:  pt. Challenged managing hair with compensatory movement patterns.  4/15: see above chart.  5/12: standing AAROM with isometric hold at 115 deg.  AROM <90 deg.  8/13:  See above chart Goal status: Not met  3.  Pt. Will increase LEFS to >45 out of 80 to improve safety with walking/ daily functional tasks.    Baseline: 30 out of 80. Goal status: On-going  4.  Pt. Will be able to dress/ groom independently with no shoulder pain/ limitations.    Baseline: limited with use of L shoulder during dressing.  5/12: limited with L shoulder IR behind back and bring L hand to head.    Goal status: Partially met  5.  Pt. Will increase Berg balance test to >45 out of 56 to decrease fall risk/ improve safety with walking/ prevent falls.   Baseline: 40 out of 56.   Goal status:  On-going  PLAN:  PT FREQUENCY: 1-2x/week  PT DURATION: 6 weeks  PLANNED INTERVENTIONS: 97110-Therapeutic exercises, 97530- Therapeutic  activity, [id number]- Neuromuscular re-education, 97535- Self Care, [id number]- Manual therapy, Patient/Family education, Joint mobilization, DME instructions, Cryotherapy, and Moist heat  PLAN FOR NEXT SESSION:  Progress nautilus exercises in standing positions.  Recheck R shoulder/ collarbone  Ozell JAYSON Sero, PT, DPT # [phone] Physical Therapist - [facility] 06/07/2024, 1:53 PM

## 2024-06-09 ENCOUNTER — Encounter: Payer: Self-pay | Admitting: Physical Therapy

## 2024-06-09 ENCOUNTER — Ambulatory Visit: Admitting: Physical Therapy

## 2024-06-09 DIAGNOSIS — M25512 Pain in left shoulder: Secondary | ICD-10-CM

## 2024-06-09 DIAGNOSIS — R269 Unspecified abnormalities of gait and mobility: Secondary | ICD-10-CM

## 2024-06-09 DIAGNOSIS — M6281 Muscle weakness (generalized): Secondary | ICD-10-CM

## 2024-06-09 DIAGNOSIS — M25612 Stiffness of left shoulder, not elsewhere classified: Secondary | ICD-10-CM

## 2024-06-09 DIAGNOSIS — R2689 Other abnormalities of gait and mobility: Secondary | ICD-10-CM

## 2024-06-09 NOTE — Therapy (Signed)
 OUTPATIENT PHYSICAL THERAPY SHOULDER and BALANCE TREATMENT  Patient Name: Evelyn Cisneros MRN: [id number] DOB:November 23, 1940, 83 y.o., female Today's Date: 06/09/2024  END OF SESSION:  PT End of Session - 06/09/24 1052     Visit Number 42    Number of Visits 52    Date for PT Re-Evaluation 07/14/24         1107 to 1158  (51 minutes).   Past Medical History:  Diagnosis Date   Anemia    Arthritis    Heart murmur    Hypertension    Hypothyroidism    Spondylolisthesis of lumbar region    Past Surgical History:  Procedure Laterality Date   ABDOMINAL HYSTERECTOMY     REPLACEMENT TOTAL KNEE BILATERAL     SPINE SURGERY     Patient Active Problem List   Diagnosis Date Noted   Spondylolisthesis of lumbar region 05/04/2018   Abnormal glucose level 02/12/2018   Abnormal weight gain 02/12/2018   Allergic rhinitis 02/12/2018   Asthma 02/12/2018   Bradycardia 02/12/2018   Edema 02/12/2018   Heart murmur 02/12/2018   Hypercalcemia 02/12/2018   Hypokalemia 02/12/2018   Pure hypercholesterolemia 02/12/2018   Shoulder joint pain 02/12/2018   Skin sensation disturbance 02/12/2018   Syncope 02/12/2018   Vitamin D  deficiency 02/12/2018   Central cervical cord injury, 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, 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) 04/24/2017   Muscle spasticity 04/24/2017   Weight loss 12/19/2016   Hypothyroidism 11/24/2016   Bilateral leg edema 11/24/2016   Cervical radiculopathy 11/24/2016   Disc degeneration, lumbar 11/24/2016   Essential hypertension 11/24/2016   Gallstones 11/24/2016   History of anxiety state 11/24/2016   Hyperlipidemia, unspecified 11/24/2016   Spinal stenosis of lumbar region with neurogenic claudication 11/08/2016   Anxiety 06/08/2015   PCP: Evelyn Boby LABOR, MD  REFERRING PROVIDER: Teresa Beryl CROME, PA-C  REFERRING DIAG: [id number] (ICD-10-CM) - Anterior dislocation of left humerus   THERAPY DIAG:  Post-traumatic stiffness of left shoulder joint  Acute pain of left  shoulder  Muscle weakness (generalized)  Gait difficulty  Balance disorder  Rationale for Evaluation and Treatment: Rehabilitation  ONSET DATE: 10/20/23  SUBJECTIVE:                                                                                                                                                                                      SUBJECTIVE STATEMENT: Pt. Reports falling while pushing shopping cart in parking lot at [place].  Pt. Hit head resulting in lacerations/ stitches and L anterior shoulder dislocation.  Pt. Reports 0/10 L shoulder pain at rest and 2/10 pain with eating/brushing teeth.  Pt. Was using SPC on L prior to fall  but limited due to shoulder sling.  Pt. Entered PT in w/c today due to inability to use L UE at this time.  Hand dominance: Left  PERTINENT HISTORY: Subjective   Evelyn Cisneros is a 83 y.o. female in today for: HPI: History of Present Illness  Patient here today for suture removal. She underwent lac repair of her forehead after a fall on concrete 12/30. Repaired 2 lacs with 2 interrupted sutures and 6 running locked sutures.   Today, patient reports healing well. She has had a nodule come up on her left wrist. It is mildly tender to palpation, does not bother her otherwise.   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 90 tablet 3  atorvastatin  (LIPITOR) 40 MG tablet TAKE 1 TABLET(40 MG) BY MOUTH EVERY DAY 90 tablet 3  biotin 5 mg Tab Take by mouth  busPIRone (BUSPAR) 5 MG tablet Take 1 tablet (5 mg total) by mouth 2 (two) times daily as needed 30 tablet 5  calcium  lactate 100 mg calcium  Tab Take by mouth  cholecalciferol , vitamin D3, (VITAMIN D3) 125 mcg (5,000 unit) Tab Take 1 tablet by mouth every morning 90 tablet 3  co-enzyme Q-10, ubiquinone, 200 mg capsule Take 200 mg by mouth every morning.   cyanocobalamin (VITAMIN B12) 1,000 mcg SL tablet Take by mouth  ferrous sulfate  325 (65 FE) MG tablet Take 1 tablet (325 mg total) by mouth twice a week 90 tablet 3  FUROsemide  (LASIX ) 20 MG tablet Take 1-2 tablets (20-40 mg total) by mouth once daily as needed for Edema 60 tablet 4  gabapentin  (NEURONTIN ) 300 MG capsule TAKE ONE CAPSULE BY MOUTH THREE TIMES DAILY TO FOUR TIMES DAILY AS NEEDED FOR PAIN 360 capsule 3  Lactobacillus acidophilus (PROBIOTIC ORAL) Take by mouth once daily  levothyroxine  (SYNTHROID ) 50 MCG tablet Take 1.5 tablets (75 mcg total) by mouth once daily 135 tablet 3  lidocaine  (LIDODERM ) 5 % patch as needed  losartan  (COZAAR ) 100 MG tablet TAKE 1 TABLET(100 MG) BY MOUTH EVERY DAY 90 tablet 3  MAGNESIUM ORAL Take by mouth  VIT C/VIT E AC/LUT/COPPER/ZINC  (PRESERVISION LUTEIN ORAL) Take 1 tablet by mouth every morning. Last dose one week prior to procedure  ZINC  ORAL Take 50 mg by mouth once daily   No facility-administered medications prior to visit.   Objective   Vitals:  10/30/23 1350  PainSc: 0-No pain   There is no height or weight on file to calculate BMI. Home Vitals:  Physical Exam Constitutional:  General: She is not in acute distress. Appearance: Normal appearance.  HENT:  Head: Normocephalic and atraumatic.  Eyes:  General: No scleral icterus. Extraocular Movements: Extraocular movements intact.  Conjunctiva/sclera: Conjunctivae normal.   Cardiovascular:  Rate and Rhythm: Normal rate and regular rhythm.  Pulmonary:  Effort: Pulmonary effort is normal. No respiratory distress.  Breath sounds: Normal breath sounds.  Musculoskeletal:  General: Normal range of motion.  Cervical back: Normal range of motion and neck supple.  Comments: Head lacerations with good interval healing. All sutures visualized for removal.   Left hand with significant bruising from PIPs to upper wrist, interval resolution present. There is a cystic lesion, nonmobile, mildly tender to manipulation present in her posterior central wrist.  Skin: General: Skin is warm and dry.  Coloration: Skin is not jaundiced.  Findings: No erythema or rash.  Neurological:  General: No focal deficit present.  Mental Status: She is alert.  Cranial Nerves: No cranial nerve deficit.  Coordination: Coordination normal.   Assessment & Plan  Diagnoses and all orders for this visit:  Visit for suture removal - Running central scalp suture and 2x interrupted suture of right scalp removed without difficulty. Area clean with good interval healing to wound.  - Recommend gentle washing with soap and water. Avoid aggressive scrubbing. Keep area clean and dry. Reviewed warning s/s for wound healing or new onset infection requiring further evaluation.  Wrist lesion - Possible ganglion cyst vs healing blister pocket, difficult to evaluate fully with overall bruising and injury from her recent fall. Reviewed possible diagnoses. If pain, worsening swelling, numbness/tingling or movement limitations recommend earlier evaluation. Otherwise recommend evaluation by orthopedics at her follow up if symptoms do not resolve with the rest of her injuries.   PAIN:  Are you having pain? Yes: NPRS scale: 2/10 Pain location: L shoulder Pain description: sharp/aching Aggravating factors: movement/ brushing teeth Relieving factors: rest/ use of sling  PRECAUTIONS: Shoulder  RED  FLAGS: None   WEIGHT BEARING RESTRICTIONS: No  FALLS:  Has patient fallen in last 6 months? Yes. Number of falls 2  LIVING ENVIRONMENT: Lives with: lives with their spouse Lives in: House/apartment Has following equipment at home: Single point cane and Wheelchair (manual)  OCCUPATION: Retired  PLOF: Independent with household mobility with device  PATIENT GOALS:  Increase L shoulder AROM/ strength/ pain-free mobility.    NEXT MD VISIT: 11/14/23 with Dr. Gini (ortho MD)  OBJECTIVE:  Note: Objective measures were completed at Evaluation unless otherwise noted.  DIAGNOSTIC FINDINGS:  See imaging  PATIENT SURVEYS:  FOTO initial 32/ goal 40   COGNITION: Overall cognitive status: Within functional limits for tasks assessed     SENSATION: WFL.  Moderate L hand bruising.    POSTURE: Rounded shoulders/ forward posture.  Use of L shoulder sling.    UPPER EXTREMITY ROM:  Full tear of R supraspinatus reported.    Passive ROM Right Eval AROM Left Eval PROM  Shoulder flexion 74 deg. 53 deg.  Shoulder extension    Shoulder abduction 70 deg. 42 deg.  Shoulder adduction    Shoulder internal rotation  [facility]  Shoulder external rotation  0 deg.  Elbow flexion [facility] WFL  Elbow extension [facility] WFL  Wrist flexion    Wrist extension    Wrist ulnar deviation    Wrist radial deviation    Wrist pronation    Wrist supination    (Blank rows = not tested)  UPPER EXTREMITY MMT: No MMT testing today.    SHOULDER SPECIAL TESTS: NT  JOINT MOBILITY TESTING:  No L shoulder joint mobs. Secondary to anterior dislocation.    PALPATION:  Generalized L hand tenderness/ bruising.    4/15:  Passive ROM Right seated AROM Left seated PROM  Shoulder flexion 82 deg. 95 deg.  Shoulder extension    Shoulder abduction 74 deg. 88 deg.  Shoulder adduction  Shoulder internal rotation [facility]  Shoulder external rotation 30 deg. 0 deg.  Elbow flexion [facility] WFL  Elbow extension [facility]   Wrist flexion    Wrist extension    Wrist ulnar deviation    Wrist radial deviation    Wrist pronation    Wrist supination    (Blank rows = not tested)   Passive ROM Right seated AROM Left seated P/AROM  Shoulder flexion 84 deg. 128/48 deg.  Shoulder extension    Shoulder abduction 78 deg. 124/88 deg.  Shoulder adduction    Shoulder internal rotation [facility]  Shoulder external rotation 30 deg. 8 deg.  Elbow flexion [facility] WFL  Elbow extension [facility] WFL  Wrist flexion    Wrist extension    Wrist ulnar deviation    Wrist radial deviation    Wrist pronation    Wrist supination    (Blank rows = not tested)    Gait assessment:  R antalgic gait with use of SPC.  Limited hip/knee flexion and step length with step to gait pattern.  No arm swing, esp. On R.  1 episode of shuffling R foot on floor resulting in a self-corrected balance check.    B LE MMT: R/L hip flexion (3/4), abduction (4/4), knee extension (4/4+), knee flexion (4+/4+).  Moderate R lower lower leg pitting edema (discussed benefits of compression stockings).    5xSTS:  19.0 sec./ 15.9 sec.   Berg:  40/56 (fall risk).    LEFS:  30 out of 80.                                                                           TREATMENT DATE: 06/09/2024  Subjective:  Pt. Reports no falls or LOB since last PT tx. Session.    Nustep B LE/ L UE for 10 min. L3 (consistent cadence)- to improve LE muscle endurance and L UE muscle strengthening.    There.act.:  Nautilus:  Standing L UE lat. Pull downs 30# 20x.  Standing L tricep extension 20# 15x2.  Standing L scap. Retraction 30# 20x.    Walking in hallway with use of SPC and progressing to no assistive device.  SBA for safety/ cuing.  No LOB but an increase in R antalgic gait without SPC.  6 hurdle step overs in hallway (use of SPC and R HHA).  Pt. Steps over with L LE and challenged with catching heel on top of hurdle.  No LOB but knocked over several hurdles and maintained  balance.    Sit to stands from gray chair with 4#, progressing to 6# blue ball with focus on equal LE wt. Bearing 10x.    Seated L shoulder flexion/ abduction AAROM with isometric holds and controlled eccentric lowering 5x2.    Seated L UE push/pull with PT resistance (moderate) with use of SPC. 15x.    Limited involvement with R UE due to R shoulder/ collar bone pain.     PATIENT EDUCATION: Education details: Reviewed HEP Person educated: Patient and Spouse Education method: Medical illustrator Education comprehension: verbalized understanding and returned demonstration  HOME EXERCISE PROGRAM: Access Code: [email or web address] URL: [email or web address] Date: 11/06/2023 Prepared by: Ozell Sero  Exercises - Seated Scapular Retraction  -  1 x daily - 7 x weekly - 2 sets - 10 reps - Standing Isometric Shoulder External Rotation with Doorway  - 1 x daily - 7 x weekly - 2 sets - 10 reps - 5-10sec hold - Isometric Shoulder Abduction at Wall  - 1 x daily - 7 x weekly - 2 sets - 10 reps - 5-10 sec hold - Isometric Shoulder Flexion at Wall  - 1 x daily - 7 x weekly - 2 sets - 10 reps - 5-10 sec hold   Access Code: [email or web address] URL: [email or web address] Date: 11/18/2023 Prepared by: Ozell Sero  Exercises - Seated Scapular Retraction  - 1 x daily - 7 x weekly - 2 sets - 10 reps - Supine Shoulder External Rotation AAROM with Dowel  - 1 x daily - 7 x weekly - 3 sets - 10 reps - Supine Shoulder Flexion Overhead with Dowel  - 1 x daily - 7 x weekly - 3 sets - 10 reps - Supine Shoulder Press with Dowel  - 1 x daily - 7 x weekly - 3 sets - 10 reps - Supine Shoulder Abduction AAROM with Dowel  - 1 x daily - 7 x weekly - 3 sets - 10 reps - Supine Shoulder Flexion AAROM with Dowel  - 1 x daily - 7 x weekly - 3 sets - 10 reps  Access Code: [email or web address] URL: [email or web address] Date: 12/02/2023 Prepared by: Ozell Sero  Exercises - Supine Shoulder  External Rotation AAROM with Dowel  - 1 x daily - 5 x weekly - 3 sets - 10 reps - Supine Shoulder Flexion Overhead with Dowel  - 1 x daily - 5 x weekly - 3 sets - 10 reps - Supine Shoulder Press with Dowel  - 1 x daily - 5 x weekly - 3 sets - 10 reps - Supine Shoulder Abduction AAROM with Dowel  - 1 x daily - 5 x weekly - 3 sets - 10 reps - Supine Shoulder Flexion AAROM with Dowel  - 1 x daily - 5 x weekly - 3 sets - 10 reps - Shoulder extension with resistance - Neutral  - 1 x daily - 5 x weekly - 3 sets - 10 reps - Standing Tricep Extensions with Resistance  - 1 x daily - 5 x weekly - 3 sets - 10 reps - Standing Single Arm Bicep Curls Supinated with Dumbbell  - 1 x daily - 5 x weekly - 3 sets - 10 reps  ASSESSMENT:  CLINICAL IMPRESSION:  PT tx. Focused on progressing L shoulder A/AROM and strengthening in pain tolerable range to progress functional reaching/ ADLs.  Focus on L UE resisted ex. And challenged balance during standing L UE resisted tasks.  Pt. Works hard with L UE ROM/ strengthening ex. In a pain tolerable range.  No LOB during tx. Session but CGA and use of SPC with walking tasks involving obstacles.  Pt. Will benefit from skilled PT services to increase L shoulder ROM/ strength to promote return to PLOF/ mobility with SPC.       OBJECTIVE IMPAIRMENTS: decreased activity tolerance, decreased endurance, decreased ROM, decreased strength, hypomobility, impaired flexibility, impaired sensation, improper body mechanics, postural dysfunction, and pain.   ACTIVITY LIMITATIONS: carrying, lifting, reaching overhead  PARTICIPATION LIMITATIONS: meal prep, cleaning, laundry, and shopping  PERSONAL FACTORS: Fitness and Past/current experiences are also affecting patient's functional outcome.   REHAB POTENTIAL: Good  CLINICAL DECISION MAKING: Stable/uncomplicated  EVALUATION COMPLEXITY: Moderate   GOALS: Goals reviewed with patient?  Yes  LONG TERM GOALS: Target date: 07/14/24  Pt.  Able to complete HEP with no increase c/o L shoulder pain to improve L sh. Joint mobility/ stability.   Baseline: 5/12: minimal c/o L shoulder pain/ soreness Goal status: Partially met  2.  Pt. Will increase L shoulder AROM to >100 deg. Flexion to improve management of hair/ feeding.   Baseline: no L shoulder AROM at this time.   2/24:  pt. Challenged managing hair with compensatory movement patterns.  4/15: see above chart.  5/12: standing AAROM with isometric hold at 115 deg.  AROM <90 deg.  8/13:  See above chart Goal status: Not met  3.  Pt. Will increase LEFS to >45 out of 80 to improve safety with walking/ daily functional tasks.    Baseline: 30 out of 80. Goal status: On-going  4.  Pt. Will be able to dress/ groom independently with no shoulder pain/ limitations.    Baseline: limited with use of L shoulder during dressing.  5/12: limited with L shoulder IR behind back and bring L hand to head.    Goal status: Partially met  5.  Pt. Will increase Berg balance test to >45 out of 56 to decrease fall risk/ improve safety with walking/ prevent falls.   Baseline: 40 out of 56.   Goal status:  On-going  PLAN:  PT FREQUENCY: 1-2x/week  PT DURATION: 6 weeks  PLANNED INTERVENTIONS: 97110-Therapeutic exercises, 97530- Therapeutic activity, [id number]- Neuromuscular re-education, 97535- Self Care, [id number]- Manual therapy, Patient/Family education, Joint mobilization, DME instructions, Cryotherapy, and Moist heat  PLAN FOR NEXT SESSION:  Progress nautilus exercises in standing positions.  Recheck R shoulder/ collarbone  Ozell JAYSON Sero, PT, DPT # [phone] Physical Therapist - [facility] 06/09/2024, 10:53 AM

## 2024-06-14 ENCOUNTER — Ambulatory Visit: Admitting: Physical Therapy

## 2024-06-14 ENCOUNTER — Encounter: Payer: Self-pay | Admitting: Physical Therapy

## 2024-06-14 DIAGNOSIS — R2689 Other abnormalities of gait and mobility: Secondary | ICD-10-CM

## 2024-06-14 DIAGNOSIS — M25512 Pain in left shoulder: Secondary | ICD-10-CM

## 2024-06-14 DIAGNOSIS — R269 Unspecified abnormalities of gait and mobility: Secondary | ICD-10-CM

## 2024-06-14 DIAGNOSIS — M6281 Muscle weakness (generalized): Secondary | ICD-10-CM

## 2024-06-14 DIAGNOSIS — M25612 Stiffness of left shoulder, not elsewhere classified: Secondary | ICD-10-CM | POA: Diagnosis not present

## 2024-06-14 NOTE — Therapy (Signed)
 OUTPATIENT PHYSICAL THERAPY SHOULDER and BALANCE TREATMENT  Patient Name: Evelyn Cisneros MRN: [id number] DOB:06/05/1941, 83 y.o., female Today's Date: 06/15/2024  END OF SESSION:  PT End of Session - 06/14/24 1104     Visit Number 43    Number of Visits 52    Date for PT Re-Evaluation 07/14/24    PT Start Time 1104    PT Stop Time 1159    PT Time Calculation (min) 55 min         Past Medical History:  Diagnosis Date   Anemia    Arthritis    Heart murmur    Hypertension    Hypothyroidism    Spondylolisthesis of lumbar region    Past Surgical History:  Procedure Laterality Date   ABDOMINAL HYSTERECTOMY     REPLACEMENT TOTAL KNEE BILATERAL     SPINE SURGERY     Patient Active Problem List   Diagnosis Date Noted   Spondylolisthesis of lumbar region 05/04/2018   Abnormal glucose level 02/12/2018   Abnormal weight gain 02/12/2018   Allergic rhinitis 02/12/2018   Asthma 02/12/2018   Bradycardia 02/12/2018   Edema 02/12/2018   Heart murmur 02/12/2018   Hypercalcemia 02/12/2018   Hypokalemia 02/12/2018   Pure hypercholesterolemia 02/12/2018   Shoulder joint pain 02/12/2018   Skin sensation disturbance 02/12/2018   Syncope 02/12/2018   Vitamin D  deficiency 02/12/2018   Central cervical cord injury, 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, 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) 04/24/2017   Muscle spasticity 04/24/2017   Weight loss 12/19/2016   Hypothyroidism 11/24/2016   Bilateral leg edema 11/24/2016   Cervical radiculopathy 11/24/2016   Disc degeneration, lumbar 11/24/2016   Essential hypertension 11/24/2016   Gallstones 11/24/2016   History of anxiety state 11/24/2016   Hyperlipidemia, unspecified 11/24/2016   Spinal stenosis of lumbar region with neurogenic claudication 11/08/2016   Anxiety 06/08/2015   PCP: Ashley Boby LABOR, MD  REFERRING PROVIDER: Teresa Beryl CROME, PA-C  REFERRING DIAG: [phone] (ICD-10-CM) - Anterior dislocation of left humerus   THERAPY DIAG:  Post-traumatic stiffness of  left shoulder joint  Acute pain of left shoulder  Muscle weakness (generalized)  Gait difficulty  Balance disorder  Rationale for Evaluation and Treatment: Rehabilitation  ONSET DATE: 10/20/23  SUBJECTIVE:                                                                                                                                                                                      SUBJECTIVE STATEMENT: Pt. Reports falling while pushing shopping cart in parking lot at [place].  Pt. Hit head resulting in lacerations/ stitches and L anterior shoulder dislocation.  Pt. Reports 0/10 L shoulder pain at rest and  2/10 pain with eating/brushing teeth.  Pt. Was using SPC on L prior to fall but limited due to shoulder sling.  Pt. Entered PT in w/c today due to inability to use L UE at this time.  Hand dominance: Left  PERTINENT HISTORY: Subjective   Evelyn Cisneros is a 83 y.o. female in today for: HPI: History of Present Illness  Patient here today for suture removal. She underwent lac repair of her forehead after a fall on concrete 12/30. Repaired 2 lacs with 2 interrupted sutures and 6 running locked sutures.   Today, patient reports healing well. She has had a nodule come up on her left wrist. It is mildly tender to palpation, does not bother her otherwise.   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 90 tablet 3  atorvastatin  (LIPITOR) 40 MG tablet TAKE 1 TABLET(40 MG) BY MOUTH EVERY DAY 90 tablet 3  biotin 5 mg Tab Take by mouth  busPIRone (BUSPAR) 5 MG tablet Take 1 tablet (5 mg total) by mouth 2 (two) times daily as needed 30 tablet 5  calcium  lactate 100 mg calcium  Tab Take by mouth  cholecalciferol , vitamin D3, (VITAMIN D3) 125 mcg (5,000 unit) Tab Take 1 tablet by mouth every morning 90 tablet 3  co-enzyme Q-10, ubiquinone, 200 mg capsule Take 200 mg by mouth every morning.   cyanocobalamin (VITAMIN B12) 1,000 mcg SL tablet Take by mouth  ferrous sulfate  325 (65 FE) MG tablet Take 1 tablet (325 mg total) by mouth twice a week 90 tablet 3  FUROsemide  (LASIX ) 20 MG tablet Take 1-2 tablets (20-40 mg total) by mouth once daily as needed for Edema 60 tablet 4  gabapentin  (NEURONTIN ) 300 MG capsule TAKE ONE CAPSULE BY MOUTH THREE TIMES DAILY TO FOUR TIMES DAILY AS NEEDED FOR PAIN 360 capsule 3  Lactobacillus acidophilus (PROBIOTIC ORAL) Take by mouth once daily  levothyroxine  (SYNTHROID ) 50 MCG tablet Take 1.5 tablets (75 mcg total) by mouth once daily 135 tablet 3  lidocaine  (LIDODERM ) 5 % patch as needed  losartan  (COZAAR ) 100 MG tablet TAKE 1 TABLET(100 MG) BY MOUTH EVERY DAY 90 tablet 3  MAGNESIUM ORAL Take by mouth  VIT C/VIT E AC/LUT/COPPER/ZINC  (PRESERVISION LUTEIN ORAL) Take 1 tablet by mouth every morning. Last dose one week prior to procedure  ZINC  ORAL Take 50 mg by mouth once daily   No facility-administered medications prior to visit.   Objective   Vitals:  10/30/23 1350  PainSc: 0-No pain   There is no height or weight on file to calculate BMI. Home Vitals:  Physical Exam Constitutional:  General: She is not in acute distress. Appearance: Normal appearance.  HENT:  Head: Normocephalic and atraumatic.  Eyes:  General: No scleral icterus. Extraocular Movements: Extraocular movements intact.   Conjunctiva/sclera: Conjunctivae normal.  Cardiovascular:  Rate and Rhythm: Normal rate and regular rhythm.  Pulmonary:  Effort: Pulmonary  effort is normal. No respiratory distress.  Breath sounds: Normal breath sounds.  Musculoskeletal:  General: Normal range of motion.  Cervical back: Normal range of motion and neck supple.  Comments: Head lacerations with good interval healing. All sutures visualized for removal.   Left hand with significant bruising from PIPs to upper wrist, interval resolution present. There is a cystic lesion, nonmobile, mildly tender to manipulation present in her posterior central wrist.  Skin: General: Skin is warm and dry.  Coloration: Skin is not jaundiced.  Findings: No erythema or rash.  Neurological:  General: No focal deficit present.  Mental Status: She is alert.  Cranial Nerves: No cranial nerve deficit.  Coordination: Coordination normal.   Assessment & Plan  Diagnoses and all orders for this visit:  Visit for suture removal - Running central scalp suture and 2x interrupted suture of right scalp removed without difficulty. Area clean with good interval healing to wound.  - Recommend gentle washing with soap and water. Avoid aggressive scrubbing. Keep area clean and dry. Reviewed warning s/s for wound healing or new onset infection requiring further evaluation.  Wrist lesion - Possible ganglion cyst vs healing blister pocket, difficult to evaluate fully with overall bruising and injury from her recent fall. Reviewed possible diagnoses. If pain, worsening swelling, numbness/tingling or movement limitations recommend earlier evaluation. Otherwise recommend evaluation by orthopedics at her follow up if symptoms do not resolve with the rest of her injuries.   PAIN:  Are you having pain? Yes: NPRS scale: 2/10 Pain location: L shoulder Pain description: sharp/aching Aggravating factors: movement/ brushing teeth Relieving factors: rest/ use of  sling  PRECAUTIONS: Shoulder  RED FLAGS: None   WEIGHT BEARING RESTRICTIONS: No  FALLS:  Has patient fallen in last 6 months? Yes. Number of falls 2  LIVING ENVIRONMENT: Lives with: lives with their spouse Lives in: House/apartment Has following equipment at home: Single point cane and Wheelchair (manual)  OCCUPATION: Retired  PLOF: Independent with household mobility with device  PATIENT GOALS:  Increase L shoulder AROM/ strength/ pain-free mobility.    NEXT MD VISIT: 11/14/23 with Dr. Gini (ortho MD)  OBJECTIVE:  Note: Objective measures were completed at Evaluation unless otherwise noted.  DIAGNOSTIC FINDINGS:  See imaging  PATIENT SURVEYS:  FOTO initial 32/ goal 64   COGNITION: Overall cognitive status: Within functional limits for tasks assessed     SENSATION: WFL.  Moderate L hand bruising.    POSTURE: Rounded shoulders/ forward posture.  Use of L shoulder sling.    UPPER EXTREMITY ROM:  Full tear of R supraspinatus reported.    Passive ROM Right Eval AROM Left Eval PROM  Shoulder flexion 74 deg. 53 deg.  Shoulder extension    Shoulder abduction 70 deg. 42 deg.  Shoulder adduction    Shoulder internal rotation  [facility]  Shoulder external rotation  0 deg.  Elbow flexion [facility] WFL  Elbow extension [facility] WFL  Wrist flexion    Wrist extension    Wrist ulnar deviation    Wrist radial deviation    Wrist pronation    Wrist supination    (Blank rows = not tested)  UPPER EXTREMITY MMT: No MMT testing today.    SHOULDER SPECIAL TESTS: NT  JOINT MOBILITY TESTING:  No L shoulder joint mobs. Secondary to anterior dislocation.    PALPATION:  Generalized L hand tenderness/ bruising.    4/15:  Passive ROM Right seated AROM Left seated PROM  Shoulder flexion 82 deg. 95 deg.  Shoulder extension  Shoulder abduction 74 deg. 88 deg.  Shoulder adduction    Shoulder internal rotation [facility]  Shoulder external rotation 30 deg. 0 deg.  Elbow flexion  [facility] WFL  Elbow extension [facility]  Wrist flexion    Wrist extension    Wrist ulnar deviation    Wrist radial deviation    Wrist pronation    Wrist supination    (Blank rows = not tested)   Passive ROM Right seated AROM Left seated P/AROM  Shoulder flexion 84 deg. 128/48 deg.  Shoulder extension    Shoulder abduction 78 deg. 124/88 deg.  Shoulder adduction    Shoulder internal rotation [facility]  Shoulder external rotation 30 deg. 8 deg.  Elbow flexion [facility] WFL  Elbow extension [facility] WFL  Wrist flexion    Wrist extension    Wrist ulnar deviation    Wrist radial deviation    Wrist pronation    Wrist supination    (Blank rows = not tested)    Gait assessment:  R antalgic gait with use of SPC.  Limited hip/knee flexion and step length with step to gait pattern.  No arm swing, esp. On R.  1 episode of shuffling R foot on floor resulting in a self-corrected balance check.    B LE MMT: R/L hip flexion (3/4), abduction (4/4), knee extension (4/4+), knee flexion (4+/4+).  Moderate R lower lower leg pitting edema (discussed benefits of compression stockings).    5xSTS:  19.0 sec./ 15.9 sec.   Berg:  40/56 (fall risk).    LEFS:  30 out of 80.                                                                           TREATMENT DATE: 06/15/2024  Subjective:  Pt. Reports no falls or LOB since last PT tx. Session.  Pt. Presents with limited R hand grasp since recent Botox injection.    There.act.:  Nautilus:  seated B UE lat. Pull downs with wand 30# 20x (limited R hand grasp/grip today requiring PT assist).  Standing B tricep extension 20# 15x2.  Standing L scapular retraction with handle 20x (good scapular control).  No R UE with single handle due to limited grasp today.     Seated 4# blue ball bicep curls 20x.  Focus on maintaining midline.    Sit to stands from gray chair with 4# blue ball and added chest press 10x.  Seated L shoulder flexion/ abduction AAROM with isometric holds  and controlled eccentric lowering 5x2.    Seated L UE push/pull with PT resistance (moderate) with use of SPC. 15x.    Nustep B LE/ B UE (limited R grasp) for 10 min. L3 (consistent cadence)- to improve LE muscle endurance and UE muscle strengthening.    Standing in front of //-bars (mirror feedback): marching/ hamstring curls/ hip abduction/ heel raises 20x.   Walking in hallway with use of SPC maneuvering around obstacle course.  Mod. I/SBA for safety cuing.  Good recip. Gait pattern.     PATIENT EDUCATION: Education details: Reviewed HEP Person educated: Patient and Spouse Education method: Medical illustrator Education comprehension: verbalized understanding and returned demonstration  HOME EXERCISE PROGRAM: Access Code: [email or web address] URL:  [email or web address] Date: 11/06/2023 Prepared by: Ozell Sero  Exercises - Seated Scapular Retraction  - 1 x daily - 7 x weekly - 2 sets - 10 reps - Standing Isometric Shoulder External Rotation with Doorway  - 1 x daily - 7 x weekly - 2 sets - 10 reps - 5-10sec hold - Isometric Shoulder Abduction at Wall  - 1 x daily - 7 x weekly - 2 sets - 10 reps - 5-10 sec hold - Isometric Shoulder Flexion at Wall  - 1 x daily - 7 x weekly - 2 sets - 10 reps - 5-10 sec hold   Access Code: [email or web address] URL: [email or web address] Date: 11/18/2023 Prepared by: Ozell Sero  Exercises - Seated Scapular Retraction  - 1 x daily - 7 x weekly - 2 sets - 10 reps - Supine Shoulder External Rotation AAROM with Dowel  - 1 x daily - 7 x weekly - 3 sets - 10 reps - Supine Shoulder Flexion Overhead with Dowel  - 1 x daily - 7 x weekly - 3 sets - 10 reps - Supine Shoulder Press with Dowel  - 1 x daily - 7 x weekly - 3 sets - 10 reps - Supine Shoulder Abduction AAROM with Dowel  - 1 x daily - 7 x weekly - 3 sets - 10 reps - Supine Shoulder Flexion AAROM with Dowel  - 1 x daily - 7 x weekly - 3 sets - 10 reps  Access Code:  [email or web address] URL: [email or web address] Date: 12/02/2023 Prepared by: Ozell Sero  Exercises - Supine Shoulder External Rotation AAROM with Dowel  - 1 x daily - 5 x weekly - 3 sets - 10 reps - Supine Shoulder Flexion Overhead with Dowel  - 1 x daily - 5 x weekly - 3 sets - 10 reps - Supine Shoulder Press with Dowel  - 1 x daily - 5 x weekly - 3 sets - 10 reps - Supine Shoulder Abduction AAROM with Dowel  - 1 x daily - 5 x weekly - 3 sets - 10 reps - Supine Shoulder Flexion AAROM with Dowel  - 1 x daily - 5 x weekly - 3 sets - 10 reps - Shoulder extension with resistance - Neutral  - 1 x daily - 5 x weekly - 3 sets - 10 reps - Standing Tricep Extensions with Resistance  - 1 x daily - 5 x weekly - 3 sets - 10 reps - Standing Single Arm Bicep Curls Supinated with Dumbbell  - 1 x daily - 5 x weekly - 3 sets - 10 reps  ASSESSMENT:  CLINICAL IMPRESSION:  PT tx. Focused on progressing L shoulder A/AROM and strengthening in pain tolerable range to progress functional reaching/ ADLs.  PT returned to use of R UE with resisted ex. but pt. Limited with R hand grasp/ grip of wt. Wand.  Light UE assist required with standing hip ex. At //-bars.  No LOB during tx. With focus on a consistent recip. Gait pattern.  Pt. Works hard with L UE ROM/ strengthening ex. In a pain tolerable range.  Pt. Will benefit from skilled PT services to increase L shoulder ROM/ strength to promote return to PLOF/ mobility with SPC.       OBJECTIVE IMPAIRMENTS: decreased activity tolerance, decreased endurance, decreased ROM, decreased strength, hypomobility, impaired flexibility, impaired sensation, improper body mechanics, postural dysfunction, and pain.   ACTIVITY LIMITATIONS: carrying, lifting, reaching overhead  PARTICIPATION LIMITATIONS: meal prep, cleaning, laundry, and shopping  PERSONAL FACTORS: Fitness and Past/current  experiences are also affecting patient's functional outcome.   REHAB POTENTIAL:  Good  CLINICAL DECISION MAKING: Stable/uncomplicated  EVALUATION COMPLEXITY: Moderate   GOALS: Goals reviewed with patient? Yes  LONG TERM GOALS: Target date: 07/14/24  Pt. Able to complete HEP with no increase c/o L shoulder pain to improve L sh. Joint mobility/ stability.   Baseline: 5/12: minimal c/o L shoulder pain/ soreness Goal status: Partially met  2.  Pt. Will increase L shoulder AROM to >100 deg. Flexion to improve management of hair/ feeding.   Baseline: no L shoulder AROM at this time.   2/24:  pt. Challenged managing hair with compensatory movement patterns.  4/15: see above chart.  5/12: standing AAROM with isometric hold at 115 deg.  AROM <90 deg.  8/13:  See above chart Goal status: Not met  3.  Pt. Will increase LEFS to >45 out of 80 to improve safety with walking/ daily functional tasks.    Baseline: 30 out of 80. Goal status: On-going  4.  Pt. Will be able to dress/ groom independently with no shoulder pain/ limitations.    Baseline: limited with use of L shoulder during dressing.  5/12: limited with L shoulder IR behind back and bring L hand to head.    Goal status: Partially met  5.  Pt. Will increase Berg balance test to >45 out of 56 to decrease fall risk/ improve safety with walking/ prevent falls.   Baseline: 40 out of 56.   Goal status:  On-going  PLAN:  PT FREQUENCY: 1-2x/week  PT DURATION: 6 weeks  PLANNED INTERVENTIONS: 97110-Therapeutic exercises, 97530- Therapeutic activity, [id number]- Neuromuscular re-education, 97535- Self Care, [id number]- Manual therapy, Patient/Family education, Joint mobilization, DME instructions, Cryotherapy, and Moist heat  PLAN FOR NEXT SESSION:  Progress nautilus exercises in standing positions.  Recheck R grip strength  Ozell JAYSON Sero, PT, DPT # [phone] Physical Therapist - [place]  [facility] 06/15/2024, 8:34 AM

## 2024-06-16 ENCOUNTER — Ambulatory Visit: Admitting: Physical Therapy

## 2024-06-16 DIAGNOSIS — M25512 Pain in left shoulder: Secondary | ICD-10-CM

## 2024-06-16 DIAGNOSIS — M25612 Stiffness of left shoulder, not elsewhere classified: Secondary | ICD-10-CM

## 2024-06-16 DIAGNOSIS — R2689 Other abnormalities of gait and mobility: Secondary | ICD-10-CM

## 2024-06-16 DIAGNOSIS — M6281 Muscle weakness (generalized): Secondary | ICD-10-CM

## 2024-06-16 DIAGNOSIS — R269 Unspecified abnormalities of gait and mobility: Secondary | ICD-10-CM

## 2024-06-16 NOTE — Therapy (Signed)
 OUTPATIENT PHYSICAL THERAPY SHOULDER and BALANCE TREATMENT  Patient Name: Evelyn Cisneros MRN: [id number] DOB:Jul 24, 1941, 83 y.o., female Today's Date: 06/16/2024  END OF SESSION:  PT End of Session - 06/16/24 1252     Visit Number 44    Number of Visits 52    Date for PT Re-Evaluation 07/14/24    PT Start Time 1106    PT Stop Time 1201    PT Time Calculation (min) 55 min         Past Medical History:  Diagnosis Date   Anemia    Arthritis    Heart murmur    Hypertension    Hypothyroidism    Spondylolisthesis of lumbar region    Past Surgical History:  Procedure Laterality Date   ABDOMINAL HYSTERECTOMY     REPLACEMENT TOTAL KNEE BILATERAL     SPINE SURGERY     Patient Active Problem List   Diagnosis Date Noted   Spondylolisthesis of lumbar region 05/04/2018   Abnormal glucose level 02/12/2018   Abnormal weight gain 02/12/2018   Allergic rhinitis 02/12/2018   Asthma 02/12/2018   Bradycardia 02/12/2018   Edema 02/12/2018   Heart murmur 02/12/2018   Hypercalcemia 02/12/2018   Hypokalemia 02/12/2018   Pure hypercholesterolemia 02/12/2018   Shoulder joint pain 02/12/2018   Skin sensation disturbance 02/12/2018   Syncope 02/12/2018   Vitamin D  deficiency 02/12/2018   Central cervical cord injury, 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, 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) 04/24/2017   Muscle spasticity 04/24/2017   Weight loss 12/19/2016   Hypothyroidism 11/24/2016   Bilateral leg edema 11/24/2016   Cervical radiculopathy 11/24/2016   Disc degeneration, lumbar 11/24/2016   Essential hypertension 11/24/2016   Gallstones 11/24/2016   History of anxiety state 11/24/2016   Hyperlipidemia, unspecified 11/24/2016   Spinal stenosis of lumbar region with neurogenic claudication 11/08/2016   Anxiety 06/08/2015   PCP: Ashley Boby LABOR, MD  REFERRING PROVIDER: Teresa Beryl CROME, PA-C  REFERRING DIAG: [phone] (ICD-10-CM) - Anterior dislocation of left humerus   THERAPY DIAG:  Post-traumatic stiffness of  left shoulder joint  Acute pain of left shoulder  Muscle weakness (generalized)  Gait difficulty  Balance disorder  Rationale for Evaluation and Treatment: Rehabilitation  ONSET DATE: 10/20/23  SUBJECTIVE:                                                                                                                                                                                      SUBJECTIVE STATEMENT: Pt. Reports falling while pushing shopping cart in parking lot at [place].  Pt. Hit head resulting in lacerations/ stitches and L anterior shoulder dislocation.  Pt. Reports 0/10 L shoulder pain at rest and  2/10 pain with eating/brushing teeth.  Pt. Was using SPC on L prior to fall but limited due to shoulder sling.  Pt. Entered PT in w/c today due to inability to use L UE at this time.  Hand dominance: Left  PERTINENT HISTORY: Subjective   Evelyn Cisneros is a 83 y.o. female in today for: HPI: History of Present Illness  Patient here today for suture removal. She underwent lac repair of her forehead after a fall on concrete 12/30. Repaired 2 lacs with 2 interrupted sutures and 6 running locked sutures.   Today, patient reports healing well. She has had a nodule come up on her left wrist. It is mildly tender to palpation, does not bother her otherwise.   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 90 tablet 3  atorvastatin  (LIPITOR) 40 MG tablet TAKE 1 TABLET(40 MG) BY MOUTH EVERY DAY 90 tablet 3  biotin 5 mg Tab Take by mouth  busPIRone (BUSPAR) 5 MG tablet Take 1 tablet (5 mg total) by mouth 2 (two) times daily as needed 30 tablet 5  calcium  lactate 100 mg calcium  Tab Take by mouth  cholecalciferol , vitamin D3, (VITAMIN D3) 125 mcg (5,000 unit) Tab Take 1 tablet by mouth every morning 90 tablet 3  co-enzyme Q-10, ubiquinone, 200 mg capsule Take 200 mg by mouth every morning.   cyanocobalamin (VITAMIN B12) 1,000 mcg SL tablet Take by mouth  ferrous sulfate  325 (65 FE) MG tablet Take 1 tablet (325 mg total) by mouth twice a week 90 tablet 3  FUROsemide  (LASIX ) 20 MG tablet Take 1-2 tablets (20-40 mg total) by mouth once daily as needed for Edema 60 tablet 4  gabapentin  (NEURONTIN ) 300 MG capsule TAKE ONE CAPSULE BY MOUTH THREE TIMES DAILY TO FOUR TIMES DAILY AS NEEDED FOR PAIN 360 capsule 3  Lactobacillus acidophilus (PROBIOTIC ORAL) Take by mouth once daily  levothyroxine  (SYNTHROID ) 50 MCG tablet Take 1.5 tablets (75 mcg total) by mouth once daily 135 tablet 3  lidocaine  (LIDODERM ) 5 % patch as needed  losartan  (COZAAR ) 100 MG tablet TAKE 1 TABLET(100 MG) BY MOUTH EVERY DAY 90 tablet 3  MAGNESIUM ORAL Take by mouth  VIT C/VIT E AC/LUT/COPPER/ZINC  (PRESERVISION LUTEIN ORAL) Take 1 tablet by mouth every morning. Last dose one week prior to procedure  ZINC  ORAL Take 50 mg by mouth once daily   No facility-administered medications prior to visit.   Objective   Vitals:  10/30/23 1350  PainSc: 0-No pain   There is no height or weight on file to calculate BMI. Home Vitals:  Physical Exam Constitutional:  General: She is not in acute distress. Appearance: Normal appearance.  HENT:  Head: Normocephalic and atraumatic.  Eyes:  General: No scleral icterus. Extraocular Movements: Extraocular movements intact.   Conjunctiva/sclera: Conjunctivae normal.  Cardiovascular:  Rate and Rhythm: Normal rate and regular rhythm.  Pulmonary:  Effort: Pulmonary  effort is normal. No respiratory distress.  Breath sounds: Normal breath sounds.  Musculoskeletal:  General: Normal range of motion.  Cervical back: Normal range of motion and neck supple.  Comments: Head lacerations with good interval healing. All sutures visualized for removal.   Left hand with significant bruising from PIPs to upper wrist, interval resolution present. There is a cystic lesion, nonmobile, mildly tender to manipulation present in her posterior central wrist.  Skin: General: Skin is warm and dry.  Coloration: Skin is not jaundiced.  Findings: No erythema or rash.  Neurological:  General: No focal deficit present.  Mental Status: She is alert.  Cranial Nerves: No cranial nerve deficit.  Coordination: Coordination normal.   Assessment & Plan  Diagnoses and all orders for this visit:  Visit for suture removal - Running central scalp suture and 2x interrupted suture of right scalp removed without difficulty. Area clean with good interval healing to wound.  - Recommend gentle washing with soap and water. Avoid aggressive scrubbing. Keep area clean and dry. Reviewed warning s/s for wound healing or new onset infection requiring further evaluation.  Wrist lesion - Possible ganglion cyst vs healing blister pocket, difficult to evaluate fully with overall bruising and injury from her recent fall. Reviewed possible diagnoses. If pain, worsening swelling, numbness/tingling or movement limitations recommend earlier evaluation. Otherwise recommend evaluation by orthopedics at her follow up if symptoms do not resolve with the rest of her injuries.   PAIN:  Are you having pain? Yes: NPRS scale: 2/10 Pain location: L shoulder Pain description: sharp/aching Aggravating factors: movement/ brushing teeth Relieving factors: rest/ use of  sling  PRECAUTIONS: Shoulder  RED FLAGS: None   WEIGHT BEARING RESTRICTIONS: No  FALLS:  Has patient fallen in last 6 months? Yes. Number of falls 2  LIVING ENVIRONMENT: Lives with: lives with their spouse Lives in: House/apartment Has following equipment at home: Single point cane and Wheelchair (manual)  OCCUPATION: Retired  PLOF: Independent with household mobility with device  PATIENT GOALS:  Increase L shoulder AROM/ strength/ pain-free mobility.    NEXT MD VISIT: 11/14/23 with Dr. Gini (ortho MD)  OBJECTIVE:  Note: Objective measures were completed at Evaluation unless otherwise noted.  DIAGNOSTIC FINDINGS:  See imaging  PATIENT SURVEYS:  FOTO initial 32/ goal 84   COGNITION: Overall cognitive status: Within functional limits for tasks assessed     SENSATION: WFL.  Moderate L hand bruising.    POSTURE: Rounded shoulders/ forward posture.  Use of L shoulder sling.    UPPER EXTREMITY ROM:  Full tear of R supraspinatus reported.    Passive ROM Right Eval AROM Left Eval PROM  Shoulder flexion 74 deg. 53 deg.  Shoulder extension    Shoulder abduction 70 deg. 42 deg.  Shoulder adduction    Shoulder internal rotation  [facility]  Shoulder external rotation  0 deg.  Elbow flexion [facility] WFL  Elbow extension [facility] WFL  Wrist flexion    Wrist extension    Wrist ulnar deviation    Wrist radial deviation    Wrist pronation    Wrist supination    (Blank rows = not tested)  UPPER EXTREMITY MMT: No MMT testing today.    SHOULDER SPECIAL TESTS: NT  JOINT MOBILITY TESTING:  No L shoulder joint mobs. Secondary to anterior dislocation.    PALPATION:  Generalized L hand tenderness/ bruising.    4/15:  Passive ROM Right seated AROM Left seated PROM  Shoulder flexion 82 deg. 95 deg.  Shoulder extension  Shoulder abduction 74 deg. 88 deg.  Shoulder adduction    Shoulder internal rotation [facility]  Shoulder external rotation 30 deg. 0 deg.  Elbow flexion  [facility] WFL  Elbow extension [facility]  Wrist flexion    Wrist extension    Wrist ulnar deviation    Wrist radial deviation    Wrist pronation    Wrist supination    (Blank rows = not tested)   Passive ROM Right seated AROM Left seated P/AROM  Shoulder flexion 84 deg. 128/48 deg.  Shoulder extension    Shoulder abduction 78 deg. 124/88 deg.  Shoulder adduction    Shoulder internal rotation [facility]  Shoulder external rotation 30 deg. 8 deg.  Elbow flexion [facility] WFL  Elbow extension [facility] WFL  Wrist flexion    Wrist extension    Wrist ulnar deviation    Wrist radial deviation    Wrist pronation    Wrist supination    (Blank rows = not tested)    Gait assessment:  R antalgic gait with use of SPC.  Limited hip/knee flexion and step length with step to gait pattern.  No arm swing, esp. On R.  1 episode of shuffling R foot on floor resulting in a self-corrected balance check.    B LE MMT: R/L hip flexion (3/4), abduction (4/4), knee extension (4/4+), knee flexion (4+/4+).  Moderate R lower lower leg pitting edema (discussed benefits of compression stockings).    5xSTS:  19.0 sec./ 15.9 sec.   Berg:  40/56 (fall risk).    LEFS:  30 out of 80.                                                                           TREATMENT DATE: 06/16/2024  Subjective:  Pt. Reports no falls or LOB since last PT tx. Session.  Pt. Presents with continued difficulty with R hand grasp since recent Botox injection.  Pt. Dealing with stressful situation with family member health report.    There.act.:  Seated L shoulder flexion/ abduction AAROM with isometric holds and controlled eccentric lowering 5x2.    Nautilus:  seated B UE lat. Pull downs with wand 30# 20x (limited R hand grasp but improved since last tx/ PT assist PRN).  Standing B tricep extension 20# 15x2.  Standing L scapular retraction with wand 20x (good scapular control).   Seated B UE push/pull with PT resistance (moderate) with use of  SPC. 15x.    Nustep B LE/ B UE (limited R grasp) for 10 min. L3 (consistent cadence)- to improve LE muscle endurance and UE muscle strengthening.  B UE fatigue <5 min.     Standing in front of //-bars (mirror feedback): marching/ hamstring curls/ hip abduction/ heel raises 20x.   Walking in clinic/ hallway with use of SPC maneuvering around obstacle course.  Mod. I/SBA for safety cuing.  Good recip. Gait pattern.     PATIENT EDUCATION: Education details: Reviewed HEP Person educated: Patient and Spouse Education method: Medical illustrator Education comprehension: verbalized understanding and returned demonstration  HOME EXERCISE PROGRAM: Access Code: [email or web address] URL: [email or web address] Date: 11/06/2023 Prepared by: Ozell Sero  Exercises - Seated Scapular Retraction  - 1 x daily - 7  x weekly - 2 sets - 10 reps - Standing Isometric Shoulder External Rotation with Doorway  - 1 x daily - 7 x weekly - 2 sets - 10 reps - 5-10sec hold - Isometric Shoulder Abduction at Wall  - 1 x daily - 7 x weekly - 2 sets - 10 reps - 5-10 sec hold - Isometric Shoulder Flexion at Wall  - 1 x daily - 7 x weekly - 2 sets - 10 reps - 5-10 sec hold   Access Code: [email or web address] URL: [email or web address] Date: 11/18/2023 Prepared by: Ozell Sero  Exercises - Seated Scapular Retraction  - 1 x daily - 7 x weekly - 2 sets - 10 reps - Supine Shoulder External Rotation AAROM with Dowel  - 1 x daily - 7 x weekly - 3 sets - 10 reps - Supine Shoulder Flexion Overhead with Dowel  - 1 x daily - 7 x weekly - 3 sets - 10 reps - Supine Shoulder Press with Dowel  - 1 x daily - 7 x weekly - 3 sets - 10 reps - Supine Shoulder Abduction AAROM with Dowel  - 1 x daily - 7 x weekly - 3 sets - 10 reps - Supine Shoulder Flexion AAROM with Dowel  - 1 x daily - 7 x weekly - 3 sets - 10 reps  Access Code: [email or web address] URL: [email or web address] Date: 12/02/2023 Prepared by:  Ozell Sero  Exercises - Supine Shoulder External Rotation AAROM with Dowel  - 1 x daily - 5 x weekly - 3 sets - 10 reps - Supine Shoulder Flexion Overhead with Dowel  - 1 x daily - 5 x weekly - 3 sets - 10 reps - Supine Shoulder Press with Dowel  - 1 x daily - 5 x weekly - 3 sets - 10 reps - Supine Shoulder Abduction AAROM with Dowel  - 1 x daily - 5 x weekly - 3 sets - 10 reps - Supine Shoulder Flexion AAROM with Dowel  - 1 x daily - 5 x weekly - 3 sets - 10 reps - Shoulder extension with resistance - Neutral  - 1 x daily - 5 x weekly - 3 sets - 10 reps - Standing Tricep Extensions with Resistance  - 1 x daily - 5 x weekly - 3 sets - 10 reps - Standing Single Arm Bicep Curls Supinated with Dumbbell  - 1 x daily - 5 x weekly - 3 sets - 10 reps  ASSESSMENT:  CLINICAL IMPRESSION:  PT tx. Focused on progressing L shoulder A/AROM and strengthening in pain tolerable range to progress functional reaching/ ADLs.  PT returned to use of R UE with resisted ex. but pt. Limited with R hand grasp/ grip of wt. Wand. Improvement with R hand grasp as compared to last tx. But still limited.  Light UE assist required with standing hip ex. At //-bars.  No LOB during tx. With focus on a consistent recip. Gait pattern.  Pt. Works hard with L UE ROM/ strengthening ex. In a pain tolerable range.  Pt. Will benefit from skilled PT services to increase L shoulder ROM/ strength to promote return to PLOF/ mobility with SPC.       OBJECTIVE IMPAIRMENTS: decreased activity tolerance, decreased endurance, decreased ROM, decreased strength, hypomobility, impaired flexibility, impaired sensation, improper body mechanics, postural dysfunction, and pain.   ACTIVITY LIMITATIONS: carrying, lifting, reaching overhead  PARTICIPATION LIMITATIONS: meal prep, cleaning, laundry, and shopping  PERSONAL FACTORS: Fitness and Past/current experiences are also affecting patient's functional outcome.  REHAB POTENTIAL: Good  CLINICAL  DECISION MAKING: Stable/uncomplicated  EVALUATION COMPLEXITY: Moderate   GOALS: Goals reviewed with patient? Yes  LONG TERM GOALS: Target date: 07/14/24  Pt. Able to complete HEP with no increase c/o L shoulder pain to improve L sh. Joint mobility/ stability.   Baseline: 5/12: minimal c/o L shoulder pain/ soreness Goal status: Partially met  2.  Pt. Will increase L shoulder AROM to >100 deg. Flexion to improve management of hair/ feeding.   Baseline: no L shoulder AROM at this time.   2/24:  pt. Challenged managing hair with compensatory movement patterns.  4/15: see above chart.  5/12: standing AAROM with isometric hold at 115 deg.  AROM <90 deg.  8/13:  See above chart Goal status: Not met  3.  Pt. Will increase LEFS to >45 out of 80 to improve safety with walking/ daily functional tasks.    Baseline: 30 out of 80. Goal status: On-going  4.  Pt. Will be able to dress/ groom independently with no shoulder pain/ limitations.    Baseline: limited with use of L shoulder during dressing.  5/12: limited with L shoulder IR behind back and bring L hand to head.    Goal status: Partially met  5.  Pt. Will increase Berg balance test to >45 out of 56 to decrease fall risk/ improve safety with walking/ prevent falls.   Baseline: 40 out of 56.   Goal status:  On-going  PLAN:  PT FREQUENCY: 1-2x/week  PT DURATION: 6 weeks  PLANNED INTERVENTIONS: 97110-Therapeutic exercises, 97530- Therapeutic activity, [id number]- Neuromuscular re-education, 97535- Self Care, [id number]- Manual therapy, Patient/Family education, Joint mobilization, DME instructions, Cryotherapy, and Moist heat  PLAN FOR NEXT SESSION:  Progress nautilus exercises in standing positions.  Recheck R grip strength  Ozell JAYSON Sero, PT, DPT # [id number] Physical Therapist - [facility] 06/16/2024, 12:53 PM

## 2024-06-22 ENCOUNTER — Ambulatory Visit: Attending: Orthopedic Surgery | Admitting: Physical Therapy

## 2024-06-22 DIAGNOSIS — R269 Unspecified abnormalities of gait and mobility: Secondary | ICD-10-CM | POA: Diagnosis present

## 2024-06-22 DIAGNOSIS — M25612 Stiffness of left shoulder, not elsewhere classified: Secondary | ICD-10-CM | POA: Insufficient documentation

## 2024-06-22 DIAGNOSIS — M6281 Muscle weakness (generalized): Secondary | ICD-10-CM | POA: Diagnosis present

## 2024-06-22 DIAGNOSIS — M25512 Pain in left shoulder: Secondary | ICD-10-CM | POA: Insufficient documentation

## 2024-06-22 DIAGNOSIS — R2689 Other abnormalities of gait and mobility: Secondary | ICD-10-CM | POA: Insufficient documentation

## 2024-06-22 NOTE — Therapy (Signed)
 OUTPATIENT PHYSICAL THERAPY SHOULDER and BALANCE TREATMENT  Patient Name: Evelyn Cisneros MRN: [id number] DOB:Apr 19, 1941, 83 y.o., female Today's Date: 06/23/2024  END OF SESSION:  PT End of Session - 06/22/24 1110     Visit Number 45    Number of Visits 52    Date for PT Re-Evaluation 07/14/24    PT Start Time 1110    PT Stop Time 1202    PT Time Calculation (min) 52 min         Past Medical History:  Diagnosis Date   Anemia    Arthritis    Heart murmur    Hypertension    Hypothyroidism    Spondylolisthesis of lumbar region    Past Surgical History:  Procedure Laterality Date   ABDOMINAL HYSTERECTOMY     REPLACEMENT TOTAL KNEE BILATERAL     SPINE SURGERY     Patient Active Problem List   Diagnosis Date Noted   Spondylolisthesis of lumbar region 05/04/2018   Abnormal glucose level 02/12/2018   Abnormal weight gain 02/12/2018   Allergic rhinitis 02/12/2018   Asthma 02/12/2018   Bradycardia 02/12/2018   Edema 02/12/2018   Heart murmur 02/12/2018   Hypercalcemia 02/12/2018   Hypokalemia 02/12/2018   Pure hypercholesterolemia 02/12/2018   Shoulder joint pain 02/12/2018   Skin sensation disturbance 02/12/2018   Syncope 02/12/2018   Vitamin D  deficiency 02/12/2018   Central cervical cord injury, 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, 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) 04/24/2017   Muscle spasticity 04/24/2017   Weight loss 12/19/2016   Hypothyroidism 11/24/2016   Bilateral leg edema 11/24/2016   Cervical radiculopathy 11/24/2016   Disc degeneration, lumbar 11/24/2016   Essential hypertension 11/24/2016   Gallstones 11/24/2016   History of anxiety state 11/24/2016   Hyperlipidemia, unspecified 11/24/2016   Spinal stenosis of lumbar region with neurogenic claudication 11/08/2016   Anxiety 06/08/2015   PCP: Ashley Boby LABOR, MD  REFERRING PROVIDER: Teresa Beryl CROME, PA-C  REFERRING DIAG: [phone] (ICD-10-CM) - Anterior dislocation of left humerus   THERAPY DIAG:  Post-traumatic stiffness of  left shoulder joint  Acute pain of left shoulder  Muscle weakness (generalized)  Gait difficulty  Balance disorder  Rationale for Evaluation and Treatment: Rehabilitation  ONSET DATE: 10/20/23  SUBJECTIVE:                                                                                                                                                                                      SUBJECTIVE STATEMENT: Pt. Reports falling while pushing shopping cart in parking lot at [employer].  Pt. Hit head resulting in lacerations/ stitches and L anterior shoulder dislocation.  Pt. Reports 0/10 L shoulder pain at rest and  2/10 pain with eating/brushing teeth.  Pt. Was using SPC on L prior to fall but limited due to shoulder sling.  Pt. Entered PT in w/c today due to inability to use L UE at this time.  Hand dominance: Left  PERTINENT HISTORY: Subjective   Evelyn Cisneros is a 83 y.o. female in today for: HPI: History of Present Illness  Patient here today for suture removal. She underwent lac repair of her forehead after a fall on concrete 12/30. Repaired 2 lacs with 2 interrupted sutures and 6 running locked sutures.   Today, patient reports healing well. She has had a nodule come up on her left wrist. It is mildly tender to palpation, does not bother her otherwise.   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 90 tablet 3  atorvastatin  (LIPITOR) 40 MG tablet TAKE 1 TABLET(40 MG) BY MOUTH EVERY DAY 90 tablet 3  biotin 5 mg Tab Take by mouth  busPIRone (BUSPAR) 5 MG tablet Take 1 tablet (5 mg total) by mouth 2 (two) times daily as needed 30 tablet 5  calcium  lactate 100 mg calcium  Tab Take by mouth  cholecalciferol , vitamin D3, (VITAMIN D3) 125 mcg (5,000 unit) Tab Take 1 tablet by mouth every morning 90 tablet 3  co-enzyme Q-10, ubiquinone, 200 mg capsule Take 200 mg by mouth every morning.   cyanocobalamin (VITAMIN B12) 1,000 mcg SL tablet Take by mouth  ferrous sulfate  325 (65 FE) MG tablet Take 1 tablet (325 mg total) by mouth twice a week 90 tablet 3  FUROsemide  (LASIX ) 20 MG tablet Take 1-2 tablets (20-40 mg total) by mouth once daily as needed for Edema 60 tablet 4  gabapentin  (NEURONTIN ) 300 MG capsule TAKE ONE CAPSULE BY MOUTH THREE TIMES DAILY TO FOUR TIMES DAILY AS NEEDED FOR PAIN 360 capsule 3  Lactobacillus acidophilus (PROBIOTIC ORAL) Take by mouth once daily  levothyroxine  (SYNTHROID ) 50 MCG tablet Take 1.5 tablets (75 mcg total) by mouth once daily 135 tablet 3  lidocaine  (LIDODERM ) 5 % patch as needed  losartan  (COZAAR ) 100 MG tablet TAKE 1 TABLET(100 MG) BY MOUTH EVERY DAY 90 tablet 3  MAGNESIUM ORAL Take by mouth  VIT C/VIT E AC/LUT/COPPER/ZINC  (PRESERVISION LUTEIN ORAL) Take 1 tablet by mouth every morning. Last dose one week prior to procedure  ZINC  ORAL Take 50 mg by mouth once daily   No facility-administered medications prior to visit.   Objective   Vitals:  10/30/23 1350  PainSc: 0-No pain   There is no height or weight on file to calculate BMI. Home Vitals:  Physical Exam Constitutional:  General: She is not in acute distress. Appearance: Normal appearance.  HENT:  Head: Normocephalic and atraumatic.  Eyes:  General: No scleral icterus. Extraocular Movements: Extraocular movements intact.   Conjunctiva/sclera: Conjunctivae normal.  Cardiovascular:  Rate and Rhythm: Normal rate and regular rhythm.  Pulmonary:  Effort: Pulmonary  effort is normal. No respiratory distress.  Breath sounds: Normal breath sounds.  Musculoskeletal:  General: Normal range of motion.  Cervical back: Normal range of motion and neck supple.  Comments: Head lacerations with good interval healing. All sutures visualized for removal.   Left hand with significant bruising from PIPs to upper wrist, interval resolution present. There is a cystic lesion, nonmobile, mildly tender to manipulation present in her posterior central wrist.  Skin: General: Skin is warm and dry.  Coloration: Skin is not jaundiced.  Findings: No erythema or rash.  Neurological:  General: No focal deficit present.  Mental Status: She is alert.  Cranial Nerves: No cranial nerve deficit.  Coordination: Coordination normal.   Assessment & Plan  Diagnoses and all orders for this visit:  Visit for suture removal - Running central scalp suture and 2x interrupted suture of right scalp removed without difficulty. Area clean with good interval healing to wound.  - Recommend gentle washing with soap and water. Avoid aggressive scrubbing. Keep area clean and dry. Reviewed warning s/s for wound healing or new onset infection requiring further evaluation.  Wrist lesion - Possible ganglion cyst vs healing blister pocket, difficult to evaluate fully with overall bruising and injury from her recent fall. Reviewed possible diagnoses. If pain, worsening swelling, numbness/tingling or movement limitations recommend earlier evaluation. Otherwise recommend evaluation by orthopedics at her follow up if symptoms do not resolve with the rest of her injuries.   PAIN:  Are you having pain? Yes: NPRS scale: 2/10 Pain location: L shoulder Pain description: sharp/aching Aggravating factors: movement/ brushing teeth Relieving factors: rest/ use of  sling  PRECAUTIONS: Shoulder  RED FLAGS: None   WEIGHT BEARING RESTRICTIONS: No  FALLS:  Has patient fallen in last 6 months? Yes. Number of falls 2  LIVING ENVIRONMENT: Lives with: lives with their spouse Lives in: House/apartment Has following equipment at home: Single point cane and Wheelchair (manual)  OCCUPATION: Retired  PLOF: Independent with household mobility with device  PATIENT GOALS:  Increase L shoulder AROM/ strength/ pain-free mobility.    NEXT MD VISIT: 11/14/23 with Dr. Gini (ortho MD)  OBJECTIVE:  Note: Objective measures were completed at Evaluation unless otherwise noted.  DIAGNOSTIC FINDINGS:  See imaging  PATIENT SURVEYS:  FOTO initial 32/ goal 72   COGNITION: Overall cognitive status: Within functional limits for tasks assessed     SENSATION: WFL.  Moderate L hand bruising.    POSTURE: Rounded shoulders/ forward posture.  Use of L shoulder sling.    UPPER EXTREMITY ROM:  Full tear of R supraspinatus reported.    Passive ROM Right Eval AROM Left Eval PROM  Shoulder flexion 74 deg. 53 deg.  Shoulder extension    Shoulder abduction 70 deg. 42 deg.  Shoulder adduction    Shoulder internal rotation  [employer]  Shoulder external rotation  0 deg.  Elbow flexion [facility] WFL  Elbow extension [facility] WFL  Wrist flexion    Wrist extension    Wrist ulnar deviation    Wrist radial deviation    Wrist pronation    Wrist supination    (Blank rows = not tested)  UPPER EXTREMITY MMT: No MMT testing today.    SHOULDER SPECIAL TESTS: NT  JOINT MOBILITY TESTING:  No L shoulder joint mobs. Secondary to anterior dislocation.    PALPATION:  Generalized L hand tenderness/ bruising.    4/15:  Passive ROM Right seated AROM Left seated PROM  Shoulder flexion 82 deg. 95 deg.  Shoulder extension  Shoulder abduction 74 deg. 88 deg.  Shoulder adduction    Shoulder internal rotation [facility]  Shoulder external rotation 30 deg. 0 deg.  Elbow flexion  [facility] WFL  Elbow extension [facility]  Wrist flexion    Wrist extension    Wrist ulnar deviation    Wrist radial deviation    Wrist pronation    Wrist supination    (Blank rows = not tested)   Passive ROM Right seated AROM Left seated P/AROM  Shoulder flexion 84 deg. 128/48 deg.  Shoulder extension    Shoulder abduction 78 deg. 124/88 deg.  Shoulder adduction    Shoulder internal rotation [facility]  Shoulder external rotation 30 deg. 8 deg.  Elbow flexion [facility] WFL  Elbow extension [facility] WFL  Wrist flexion    Wrist extension    Wrist ulnar deviation    Wrist radial deviation    Wrist pronation    Wrist supination    (Blank rows = not tested)    Gait assessment:  R antalgic gait with use of SPC.  Limited hip/knee flexion and step length with step to gait pattern.  No arm swing, esp. On R.  1 episode of shuffling R foot on floor resulting in a self-corrected balance check.    B LE MMT: R/L hip flexion (3/4), abduction (4/4), knee extension (4/4+), knee flexion (4+/4+).  Moderate R lower lower leg pitting edema (discussed benefits of compression stockings).    5xSTS:  19.0 sec./ 15.9 sec.   Berg:  40/56 (fall risk).    LEFS:  30 out of 80.                                                                           TREATMENT DATE: 06/23/2024  Subjective:  Pt. entered PT with use of SPC and reports no falls or LOB since last PT tx. Session.  Pt. Was active with family this past weekend and using L UE with daily tasks.  PT reviewed pts. limitations with donning socks and blouse.    There.act.:  Nautilus (wand):  seated B UE lat. Pull downs 30# 20x (PT assist with R hand grasp)- several readjustments required with R grasp.  Standing B tricep extension 20# 15x2.  Standing L scapular retraction 30# with wand 20x (good scapular control but limited R hand grasp).     Seated L shoulder flexion/ abduction AAROM with isometric holds and controlled eccentric lowering 8x.    Seated L shoulder ER  with upright posture (added AAROM with PT assist).  Seated B UE push/pull with PT resistance (moderate) with use of SPC. 15x.    Nustep B LE/ B UE (limited R grasp) for 10 min. L4 (consistent cadence)- to improve LE muscle endurance and UE muscle strengthening.      Walking in clinic/ hallway/ //-bars without SPC and focus on recip. Step pattern/ increase L step length.  Mod. I/SBA for safety cuing.  Good BOS noted today (use of blue line in //-bars to manage consistent BOS).     PATIENT EDUCATION: Education details: Reviewed HEP Person educated: Patient and Spouse Education method: Medical illustrator Education comprehension: verbalized understanding and returned demonstration  HOME EXERCISE PROGRAM: Access Code: [email or web address]  URL: [email or web address] Date: 11/06/2023 Prepared by: Ozell Sero  Exercises - Seated Scapular Retraction  - 1 x daily - 7 x weekly - 2 sets - 10 reps - Standing Isometric Shoulder External Rotation with Doorway  - 1 x daily - 7 x weekly - 2 sets - 10 reps - 5-10sec hold - Isometric Shoulder Abduction at Wall  - 1 x daily - 7 x weekly - 2 sets - 10 reps - 5-10 sec hold - Isometric Shoulder Flexion at Wall  - 1 x daily - 7 x weekly - 2 sets - 10 reps - 5-10 sec hold   Access Code: [email or web address] URL: [email or web address] Date: 11/18/2023 Prepared by: Ozell Sero  Exercises - Seated Scapular Retraction  - 1 x daily - 7 x weekly - 2 sets - 10 reps - Supine Shoulder External Rotation AAROM with Dowel  - 1 x daily - 7 x weekly - 3 sets - 10 reps - Supine Shoulder Flexion Overhead with Dowel  - 1 x daily - 7 x weekly - 3 sets - 10 reps - Supine Shoulder Press with Dowel  - 1 x daily - 7 x weekly - 3 sets - 10 reps - Supine Shoulder Abduction AAROM with Dowel  - 1 x daily - 7 x weekly - 3 sets - 10 reps - Supine Shoulder Flexion AAROM with Dowel  - 1 x daily - 7 x weekly - 3 sets - 10 reps  Access Code: [email or web address] URL:  [email or web address] Date: 12/02/2023 Prepared by: Ozell Sero  Exercises - Supine Shoulder External Rotation AAROM with Dowel  - 1 x daily - 5 x weekly - 3 sets - 10 reps - Supine Shoulder Flexion Overhead with Dowel  - 1 x daily - 5 x weekly - 3 sets - 10 reps - Supine Shoulder Press with Dowel  - 1 x daily - 5 x weekly - 3 sets - 10 reps - Supine Shoulder Abduction AAROM with Dowel  - 1 x daily - 5 x weekly - 3 sets - 10 reps - Supine Shoulder Flexion AAROM with Dowel  - 1 x daily - 5 x weekly - 3 sets - 10 reps - Shoulder extension with resistance - Neutral  - 1 x daily - 5 x weekly - 3 sets - 10 reps - Standing Tricep Extensions with Resistance  - 1 x daily - 5 x weekly - 3 sets - 10 reps - Standing Single Arm Bicep Curls Supinated with Dumbbell  - 1 x daily - 5 x weekly - 3 sets - 10 reps  ASSESSMENT:  CLINICAL IMPRESSION:  PT tx. Focused on progressing L shoulder A/AROM and strengthening in pain tolerable range to progress functional reaching/ ADLs.  No LOB during tx. With focus on a consistent recip. Gait pattern.  Pt. Works hard with L UE ROM/ strengthening ex. In a pain tolerable range and requires min. PT assist with R hand grasp on wand with resisted ex.  Pt. Has 1 more PT tx. Session scheduled and PT will reassess daily HEP to maintain shoulder ROM/ strength and safety with walking.  Pt. Will benefit from skilled PT services to increase L shoulder ROM/ strength to promote return to PLOF/ mobility with SPC.       OBJECTIVE IMPAIRMENTS: decreased activity tolerance, decreased endurance, decreased ROM, decreased strength, hypomobility, impaired flexibility, impaired sensation, improper body mechanics, postural dysfunction, and pain.   ACTIVITY LIMITATIONS: carrying, lifting, reaching overhead  PARTICIPATION LIMITATIONS: meal prep, cleaning, laundry, and shopping  PERSONAL FACTORS: Fitness and Past/current experiences are also affecting patient's functional outcome.    REHAB POTENTIAL: Good  CLINICAL DECISION MAKING: Stable/uncomplicated  EVALUATION COMPLEXITY: Moderate   GOALS: Goals reviewed with patient? Yes  LONG TERM GOALS: Target date: 07/14/24  Pt. Able to complete HEP with no increase c/o L shoulder pain to improve L sh. Joint mobility/ stability.   Baseline: 5/12: minimal c/o L shoulder pain/ soreness Goal status: Partially met  2.  Pt. Will increase L shoulder AROM to >100 deg. Flexion to improve management of hair/ feeding.   Baseline: no L shoulder AROM at this time.   2/24:  pt. Challenged managing hair with compensatory movement patterns.  4/15: see above chart.  5/12: standing AAROM with isometric hold at 115 deg.  AROM <90 deg.  8/13:  See above chart Goal status: Not met  3.  Pt. Will increase LEFS to >45 out of 80 to improve safety with walking/ daily functional tasks.    Baseline: 30 out of 80. Goal status: On-going  4.  Pt. Will be able to dress/ groom independently with no shoulder pain/ limitations.    Baseline: limited with use of L shoulder during dressing.  5/12: limited with L shoulder IR behind back and bring L hand to head.    Goal status: Partially met  5.  Pt. Will increase Berg balance test to >45 out of 56 to decrease fall risk/ improve safety with walking/ prevent falls.   Baseline: 40 out of 56.   Goal status:  On-going  PLAN:  PT FREQUENCY: 1-2x/week  PT DURATION: 6 weeks  PLANNED INTERVENTIONS: 97110-Therapeutic exercises, 97530- Therapeutic activity, [id number]- Neuromuscular re-education, 97535- Self Care, [id number]- Manual therapy, Patient/Family education, Joint mobilization, DME instructions, Cryotherapy, and Moist heat  PLAN FOR NEXT SESSION:  Check goals/ Discharge to HEP next visit.    Ozell JAYSON Sero, PT, DPT # [id number] Physical Therapist - [facility] 06/23/2024, 8:20 AM

## 2024-06-24 ENCOUNTER — Encounter: Payer: Self-pay | Admitting: Physical Therapy

## 2024-06-24 ENCOUNTER — Ambulatory Visit: Admitting: Physical Therapy

## 2024-06-24 DIAGNOSIS — R2689 Other abnormalities of gait and mobility: Secondary | ICD-10-CM

## 2024-06-24 DIAGNOSIS — M25612 Stiffness of left shoulder, not elsewhere classified: Secondary | ICD-10-CM | POA: Diagnosis not present

## 2024-06-24 DIAGNOSIS — M25512 Pain in left shoulder: Secondary | ICD-10-CM

## 2024-06-24 DIAGNOSIS — R269 Unspecified abnormalities of gait and mobility: Secondary | ICD-10-CM

## 2024-06-24 DIAGNOSIS — M6281 Muscle weakness (generalized): Secondary | ICD-10-CM

## 2024-06-24 NOTE — Therapy (Signed)
 OUTPATIENT PHYSICAL THERAPY SHOULDER and BALANCE TREATMENT/ DISCHARGE  Patient Name: Evelyn Cisneros MRN: [id number] DOB:11/25/1940, 83 y.o., female Today's Date: 06/25/2024  END OF SESSION:  PT End of Session - 06/24/24 1106     Visit Number 46    Number of Visits 52    Date for PT Re-Evaluation 07/14/24    PT Start Time 1106    PT Stop Time 1203    PT Time Calculation (min) 57 min         Past Medical History:  Diagnosis Date   Anemia    Arthritis    Heart murmur    Hypertension    Hypothyroidism    Spondylolisthesis of lumbar region    Past Surgical History:  Procedure Laterality Date   ABDOMINAL HYSTERECTOMY     REPLACEMENT TOTAL KNEE BILATERAL     SPINE SURGERY     Patient Active Problem List   Diagnosis Date Noted   Spondylolisthesis of lumbar region 05/04/2018   Abnormal glucose level 02/12/2018   Abnormal weight gain 02/12/2018   Allergic rhinitis 02/12/2018   Asthma 02/12/2018   Bradycardia 02/12/2018   Edema 02/12/2018   Heart murmur 02/12/2018   Hypercalcemia 02/12/2018   Hypokalemia 02/12/2018   Pure hypercholesterolemia 02/12/2018   Shoulder joint pain 02/12/2018   Skin sensation disturbance 02/12/2018   Syncope 02/12/2018   Vitamin D  deficiency 02/12/2018   Central cervical cord injury, 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, 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) 04/24/2017   Muscle spasticity 04/24/2017   Weight loss 12/19/2016   Hypothyroidism 11/24/2016   Bilateral leg edema 11/24/2016   Cervical radiculopathy 11/24/2016   Disc degeneration, lumbar 11/24/2016   Essential hypertension 11/24/2016   Gallstones 11/24/2016   History of anxiety state 11/24/2016   Hyperlipidemia, unspecified 11/24/2016   Spinal stenosis of lumbar region with neurogenic claudication 11/08/2016   Anxiety 06/08/2015   PCP: Evelyn Boby LABOR, MD  REFERRING PROVIDER: Teresa Beryl CROME, PA-C  REFERRING DIAG: [phone] (ICD-10-CM) - Anterior dislocation of left humerus   THERAPY DIAG:  Post-traumatic  stiffness of left shoulder joint  Acute pain of left shoulder  Muscle weakness (generalized)  Gait difficulty  Balance disorder  Rationale for Evaluation and Treatment: Rehabilitation  ONSET DATE: 10/20/23  SUBJECTIVE:                                                                                                                                                                                      SUBJECTIVE STATEMENT: Pt. Reports falling while pushing shopping cart in parking lot at [place].  Pt. Hit head resulting in lacerations/ stitches and L anterior shoulder dislocation.  Pt. Reports 0/10 L shoulder pain at rest  and 2/10 pain with eating/brushing teeth.  Pt. Was using SPC on L prior to fall but limited due to shoulder sling.  Pt. Entered PT in w/c today due to inability to use L UE at this time.  Hand dominance: Left  PERTINENT HISTORY: Subjective   Evelyn Cisneros is a 83 y.o. female in today for: HPI: History of Present Illness  Patient here today for suture removal. She underwent lac repair of her forehead after a fall on concrete 12/30. Repaired 2 lacs with 2 interrupted sutures and 6 running locked sutures.   Today, patient reports healing well. She has had a nodule come up on her left wrist. It is mildly tender to palpation, does not bother her otherwise.   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 90 tablet 3  atorvastatin  (LIPITOR) 40 MG tablet TAKE 1 TABLET(40 MG) BY MOUTH EVERY DAY 90 tablet 3  biotin 5 mg Tab Take by mouth  busPIRone (BUSPAR) 5 MG tablet Take 1 tablet (5 mg total) by mouth 2 (two) times daily as needed 30 tablet 5  calcium  lactate 100 mg calcium  Tab Take by mouth  cholecalciferol , vitamin D3, (VITAMIN D3) 125 mcg (5,000 unit) Tab Take 1 tablet by mouth every morning 90 tablet 3  co-enzyme Q-10, ubiquinone, 200 mg capsule Take 200 mg by mouth every morning.   cyanocobalamin (VITAMIN B12) 1,000 mcg SL tablet Take by mouth  ferrous sulfate  325 (65 FE) MG tablet Take 1 tablet (325 mg total) by mouth twice a week 90 tablet 3  FUROsemide  (LASIX ) 20 MG tablet Take 1-2 tablets (20-40 mg total) by mouth once daily as needed for Edema 60 tablet 4  gabapentin  (NEURONTIN ) 300 MG capsule TAKE ONE CAPSULE BY MOUTH THREE TIMES DAILY TO FOUR TIMES DAILY AS NEEDED FOR PAIN 360 capsule 3  Lactobacillus acidophilus (PROBIOTIC ORAL) Take by mouth once daily  levothyroxine  (SYNTHROID ) 50 MCG tablet Take 1.5 tablets (75 mcg total) by mouth once daily 135 tablet 3  lidocaine  (LIDODERM ) 5 % patch as needed  losartan  (COZAAR ) 100 MG tablet TAKE 1 TABLET(100 MG) BY MOUTH EVERY DAY 90 tablet 3  MAGNESIUM ORAL Take by mouth  VIT C/VIT E AC/LUT/COPPER/ZINC  (PRESERVISION LUTEIN ORAL) Take 1 tablet by mouth every morning. Last dose one week prior to procedure  ZINC  ORAL Take 50 mg by mouth once daily   No facility-administered medications prior to visit.   Objective   Vitals:  10/30/23 1350  PainSc: 0-No pain   There is no height or weight on file to calculate BMI. Home Vitals:  Physical Exam Constitutional:  General: She is not in acute distress. Appearance: Normal appearance.  HENT:  Head: Normocephalic and atraumatic.  Eyes:  General: No scleral icterus. Extraocular Movements: Extraocular movements  intact.  Conjunctiva/sclera: Conjunctivae normal.  Cardiovascular:  Rate and Rhythm: Normal rate and regular rhythm.  Pulmonary:  Effort:  Pulmonary effort is normal. No respiratory distress.  Breath sounds: Normal breath sounds.  Musculoskeletal:  General: Normal range of motion.  Cervical back: Normal range of motion and neck supple.  Comments: Head lacerations with good interval healing. All sutures visualized for removal.   Left hand with significant bruising from PIPs to upper wrist, interval resolution present. There is a cystic lesion, nonmobile, mildly tender to manipulation present in her posterior central wrist.  Skin: General: Skin is warm and dry.  Coloration: Skin is not jaundiced.  Findings: No erythema or rash.  Neurological:  General: No focal deficit present.  Mental Status: She is alert.  Cranial Nerves: No cranial nerve deficit.  Coordination: Coordination normal.   Assessment & Plan  Diagnoses and all orders for this visit:  Visit for suture removal - Running central scalp suture and 2x interrupted suture of right scalp removed without difficulty. Area clean with good interval healing to wound.  - Recommend gentle washing with soap and water. Avoid aggressive scrubbing. Keep area clean and dry. Reviewed warning s/s for wound healing or new onset infection requiring further evaluation.  Wrist lesion - Possible ganglion cyst vs healing blister pocket, difficult to evaluate fully with overall bruising and injury from her recent fall. Reviewed possible diagnoses. If pain, worsening swelling, numbness/tingling or movement limitations recommend earlier evaluation. Otherwise recommend evaluation by orthopedics at her follow up if symptoms do not resolve with the rest of her injuries.   PAIN:  Are you having pain? Yes: NPRS scale: 2/10 Pain location: L shoulder Pain description: sharp/aching Aggravating factors: movement/ brushing teeth Relieving factors: rest/ use  of sling  PRECAUTIONS: Shoulder  RED FLAGS: None   WEIGHT BEARING RESTRICTIONS: No  FALLS:  Has patient fallen in last 6 months? Yes. Number of falls 2  LIVING ENVIRONMENT: Lives with: lives with their spouse Lives in: House/apartment Has following equipment at home: Single point cane and Wheelchair (manual)  OCCUPATION: Retired  PLOF: Independent with household mobility with device  PATIENT GOALS:  Increase L shoulder AROM/ strength/ pain-free mobility.    NEXT MD VISIT: 11/14/23 with Dr. Gini (ortho MD)  OBJECTIVE:  Note: Objective measures were completed at Evaluation unless otherwise noted.  DIAGNOSTIC FINDINGS:  See imaging  PATIENT SURVEYS:  FOTO initial 32/ goal 81   COGNITION: Overall cognitive status: Within functional limits for tasks assessed     SENSATION: WFL.  Moderate L hand bruising.    POSTURE: Rounded shoulders/ forward posture.  Use of L shoulder sling.    UPPER EXTREMITY ROM:  Full tear of R supraspinatus reported.    Passive ROM Right Eval AROM Left Eval PROM  Shoulder flexion 74 deg. 53 deg.  Shoulder extension    Shoulder abduction 70 deg. 42 deg.  Shoulder adduction    Shoulder internal rotation  [facility]  Shoulder external rotation  0 deg.  Elbow flexion [facility] WFL  Elbow extension [facility] WFL  Wrist flexion    Wrist extension    Wrist ulnar deviation    Wrist radial deviation    Wrist pronation    Wrist supination    (Blank rows = not tested)  UPPER EXTREMITY MMT: No MMT testing today.    SHOULDER SPECIAL TESTS: NT  JOINT MOBILITY TESTING:  No L shoulder joint mobs. Secondary to anterior dislocation.    PALPATION:  Generalized L hand tenderness/ bruising.    4/15:  Passive ROM Right seated AROM Left seated PROM  Shoulder flexion 82 deg. 95 deg.  Shoulder  extension    Shoulder abduction 74 deg. 88 deg.  Shoulder adduction    Shoulder internal rotation [facility]  Shoulder external rotation 30 deg. 0 deg.  Elbow  flexion [facility] WFL  Elbow extension [facility]  Wrist flexion    Wrist extension    Wrist ulnar deviation    Wrist radial deviation    Wrist pronation    Wrist supination    (Blank rows = not tested)   Passive ROM Right seated AROM Left seated P/AROM  Shoulder flexion 84 deg. 128/48 deg.  Shoulder extension    Shoulder abduction 78 deg. 124/88 deg.  Shoulder adduction    Shoulder internal rotation [facility]  Shoulder external rotation 30 deg. 8 deg.  Elbow flexion [facility] WFL  Elbow extension [facility] WFL  Wrist flexion    Wrist extension    Wrist ulnar deviation    Wrist radial deviation    Wrist pronation    Wrist supination    (Blank rows = not tested)    Gait assessment:  R antalgic gait with use of SPC.  Limited hip/knee flexion and step length with step to gait pattern.  No arm swing, esp. On R.  1 episode of shuffling R foot on floor resulting in a self-corrected balance check.    B LE MMT: R/L hip flexion (3/4), abduction (4/4), knee extension (4/4+), knee flexion (4+/4+).  Moderate R lower lower leg pitting edema (discussed benefits of compression stockings).    5xSTS:  19.0 sec./ 15.9 sec.   Berg:  40/56 (fall risk).    LEFS:  30 out of 80.                                                                           TREATMENT DATE: 06/25/2024  Subjective:  Pt. entered PT with use of SPC and reports no falls or LOB since last PT tx. Session.  No new complaints.  Pt. Ready for discharge from PT at this time and will focus on HEP/ daily walking.    There.act.:  Walking in hallway/ clinic without [facility] and focus on recip. Step pattern/ increase L step length.  Mod. I/SBA for safety cuing.  Good BOS noted today.  No LOB and extra time to complete turn at end of hallway safely.    Nustep B LE/ B UE (limited R grasp) for 10 min. L4 (consistent cadence)- to improve LE muscle endurance and UE muscle strengthening.     Nautilus (wand):  seated B UE lat. Pull downs 30# 20x (PT assist with  R hand grasp)- several readjustments required with R grasp.  Standing B tricep extension 20# 15x2.     Seated L shoulder flexion/ abduction AAROM with isometric holds and controlled eccentric lowering 10x (fatigue noted)    Seated L shoulder ER with upright posture (added AAROM with PT assist).  Seated B UE push/pull with PT resistance (moderate) with use of SPC. 15x.    Sit to stands with B UE on SPC 10x.     PATIENT EDUCATION: Education details: Reviewed HEP Person educated: Patient and Spouse Education method: Medical illustrator Education comprehension: verbalized understanding and returned demonstration  HOME EXERCISE PROGRAM: Access Code: [email or web address] URL: [email or web address]  Date: 11/06/2023 Prepared by: Ozell Sero  Exercises - Seated Scapular Retraction  - 1 x daily - 7 x weekly - 2 sets - 10 reps - Standing Isometric Shoulder External Rotation with Doorway  - 1 x daily - 7 x weekly - 2 sets - 10 reps - 5-10sec hold - Isometric Shoulder Abduction at Wall  - 1 x daily - 7 x weekly - 2 sets - 10 reps - 5-10 sec hold - Isometric Shoulder Flexion at Wall  - 1 x daily - 7 x weekly - 2 sets - 10 reps - 5-10 sec hold   Access Code: [email or web address] URL: [email or web address] Date: 11/18/2023 Prepared by: Ozell Sero  Exercises - Seated Scapular Retraction  - 1 x daily - 7 x weekly - 2 sets - 10 reps - Supine Shoulder External Rotation AAROM with Dowel  - 1 x daily - 7 x weekly - 3 sets - 10 reps - Supine Shoulder Flexion Overhead with Dowel  - 1 x daily - 7 x weekly - 3 sets - 10 reps - Supine Shoulder Press with Dowel  - 1 x daily - 7 x weekly - 3 sets - 10 reps - Supine Shoulder Abduction AAROM with Dowel  - 1 x daily - 7 x weekly - 3 sets - 10 reps - Supine Shoulder Flexion AAROM with Dowel  - 1 x daily - 7 x weekly - 3 sets - 10 reps  Access Code: [email or web address] URL: [email or web address] Date: 12/02/2023 Prepared by: Ozell Sero  Exercises - Supine Shoulder External Rotation AAROM with Dowel  - 1 x daily - 5 x weekly - 3 sets - 10 reps - Supine Shoulder Flexion Overhead with Dowel  - 1 x daily - 5 x weekly - 3 sets - 10 reps - Supine Shoulder Press with Dowel  - 1 x daily - 5 x weekly - 3 sets - 10 reps - Supine Shoulder Abduction AAROM with Dowel  - 1 x daily - 5 x weekly - 3 sets - 10 reps - Supine Shoulder Flexion AAROM with Dowel  - 1 x daily - 5 x weekly - 3 sets - 10 reps - Shoulder extension with resistance - Neutral  - 1 x daily - 5 x weekly - 3 sets - 10 reps - Standing Tricep Extensions with Resistance  - 1 x daily - 5 x weekly - 3 sets - 10 reps - Standing Single Arm Bicep Curls Supinated with Dumbbell  - 1 x daily - 5 x weekly - 3 sets - 10 reps  ASSESSMENT:  CLINICAL IMPRESSION:  PT tx. Focused on progressing L shoulder A/AROM and strengthening in pain tolerable range to progress functional reaching/ ADLs.  No LOB during tx. With focus on a consistent recip. Gait pattern.  Pt. Has good understanding of HEP and importance of walking with use of SPC outside for safety.  Discharge from PT at this time and pt. Instructed to contact PT if any questions or regression in functional activity/ pain.        OBJECTIVE IMPAIRMENTS: decreased activity tolerance, decreased endurance, decreased ROM, decreased strength, hypomobility, impaired flexibility, impaired sensation, improper body mechanics, postural dysfunction, and pain.   ACTIVITY LIMITATIONS: carrying, lifting, reaching overhead  PARTICIPATION LIMITATIONS: meal prep, cleaning, laundry, and shopping  PERSONAL FACTORS: Fitness and Past/current experiences are also affecting patient's functional outcome.   REHAB POTENTIAL: Good  CLINICAL DECISION MAKING: Stable/uncomplicated  EVALUATION COMPLEXITY: Moderate   GOALS: Goals reviewed with patient? Yes  LONG TERM GOALS: Target date: 07/14/24  Pt. Able to complete HEP with no increase c/o L  shoulder pain to improve L sh. Joint mobility/ stability.   Baseline: 5/12: minimal c/o L shoulder pain/ soreness Goal status: Partially met  2.  Pt. Will increase L shoulder AROM to >100 deg. Flexion to improve management of hair/ feeding.   Baseline: no L shoulder AROM at this time.   2/24:  pt. Challenged managing hair with compensatory movement patterns.  4/15: see above chart.  5/12: standing AAROM with isometric hold at 115 deg.  AROM <90 deg.  8/13:  See above chart Goal status: Not met  3.  Pt. Will increase LEFS to >45 out of 80 to improve safety with walking/ daily functional tasks.    Baseline: 30 out of 80. Goal status: Not met  4.  Pt. Will be able to dress/ groom independently with no shoulder pain/ limitations.    Baseline: limited with use of L shoulder during dressing.  5/12: limited with L shoulder IR behind back and bring L hand to head.  9/4: pt. Requires assist with socks and blouses.  Goal status: Goal met  5.  Pt. Will increase Berg balance test to >45 out of 56 to decrease fall risk/ improve safety with walking/ prevent falls.   Baseline: 40 out of 56.   Goal status:  Not reassessed  PLAN:  PT FREQUENCY: 1-2x/week  PT DURATION: 6 weeks  PLANNED INTERVENTIONS: 97110-Therapeutic exercises, 97530- Therapeutic activity, 97112- Neuromuscular re-education, 97535- Self Care, [id number]- Manual therapy, Patient/Family education, Joint mobilization, DME instructions, Cryotherapy, and Moist heat  PLAN FOR NEXT SESSION:  Discharge with walking/ HEP focus   Ozell JAYSON Sero, PT, DPT # [phone] Physical Therapist - [facility] 06/25/2024, 11:17 AM

## 2024-08-22 ENCOUNTER — Ambulatory Visit: Payer: Self-pay | Admitting: Family Medicine

## 2024-08-22 VITALS — BP 138/80 | HR 60 | Temp 97.7°F | Resp 15 | Ht 61.0 in | Wt 164.9 lb

## 2024-08-22 DIAGNOSIS — R350 Frequency of micturition: Secondary | ICD-10-CM | POA: Diagnosis present

## 2024-08-22 DIAGNOSIS — N3090 Cystitis, unspecified without hematuria: Secondary | ICD-10-CM | POA: Insufficient documentation

## 2024-08-22 LAB — POCT URINE DIPSTICK
Bilirubin, UA: NEGATIVE
Glucose, UA: NEGATIVE mg/dL
Ketones, POC UA: NEGATIVE mg/dL
Nitrite, UA: NEGATIVE
POC PROTEIN,UA: 30 — AB
Spec Grav, UA: 1.01 (ref 1.010–1.025)
Urobilinogen, UA: 0.2 U/dL
pH, UA: 6 (ref 5.0–8.0)

## 2024-08-22 MED ORDER — NITROFURANTOIN MONOHYD MACRO 100 MG PO CAPS: 100.0000 mg | ORAL_CAPSULE | Freq: Two times a day (BID) | ORAL | 0 refills | Status: AC

## 2024-08-22 NOTE — ED Triage Notes (Signed)
 Patient c/o dysuria and urinary frequency and urgency for a week.  Patient reports only urinating small amounts at a time.  Patient unsure of fevers.  Patient denies blood in her urine.

## 2024-08-22 NOTE — Discharge Instructions (Addendum)
 You have a urinary tract infection. Be sure to drink plenty of water.  I sent your urine for culture to be sure the antibiotic prescribed will treat your infection. Someone may call you to change antibiotics. Stop by the pharmacy to pick up your prescriptions.  Follow up with your primary care provider or return to the urgent care, if not improving.

## 2024-08-22 NOTE — ED Provider Notes (Signed)
 [facility]    CSN: [id number] Arrival date & time: 08/22/24  0856      History   Chief Complaint Chief Complaint  Patient presents with   Urinary Frequency    Appointment     HPI HPI Karey Suthers is a 83 y.o. female.    Adisen Bennion presents for urinary frequency and dysuria for the past week. She has not been drinking a lot of water since the family was told her son who has pancreatic cancer has days to weeks to live. She doesn't want to urinate on the drive and the toilet is very low at his house.  Her husband has to get her up from the toilet when she uses the bathroom at his house.  The toilet at her house is a good height.   No history of recurrent UTIs. No fever, back pain, nausea or vomiting. She does have some mild abdominal pain described as pressure.  No treatments prior to arrival.  She googled her symptoms and got concerned that she may have a UTI so she came to the urgent care.        Past Medical History:  Diagnosis Date   Anemia    Arthritis    Heart murmur    Hypertension    Hypothyroidism    Spondylolisthesis of lumbar region     Patient Active Problem List   Diagnosis Date Noted   Spondylolisthesis of lumbar region 05/04/2018   Abnormal glucose level 02/12/2018   Abnormal weight gain 02/12/2018   Allergic rhinitis 02/12/2018   Asthma 02/12/2018   Bradycardia 02/12/2018   Edema 02/12/2018   Heart murmur 02/12/2018   Hypercalcemia 02/12/2018   Hypokalemia 02/12/2018   Pure hypercholesterolemia 02/12/2018   Shoulder joint pain 02/12/2018   Skin sensation disturbance 02/12/2018   Syncope 02/12/2018   Vitamin D  deficiency 02/12/2018   Central cervical cord injury, 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, 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) 04/24/2017   Muscle spasticity 04/24/2017   Weight loss 12/19/2016   Hypothyroidism 11/24/2016   Bilateral leg edema 11/24/2016   Cervical radiculopathy 11/24/2016   Disc degeneration, lumbar 11/24/2016   Essential  hypertension 11/24/2016   Gallstones 11/24/2016   History of anxiety state 11/24/2016   Hyperlipidemia, unspecified 11/24/2016   Spinal stenosis of lumbar region with neurogenic claudication 11/08/2016   Anxiety 06/08/2015    Past Surgical History:  Procedure Laterality Date   ABDOMINAL HYSTERECTOMY     REPLACEMENT TOTAL KNEE BILATERAL     SPINE SURGERY      OB History   No obstetric history on file.      Home Medications    Prior to Admission medications   Medication Sig Start Date End Date Taking? Authorizing Provider  nitrofurantoin, macrocrystal-monohydrate, (MACROBID) 100 MG capsule Take 1 capsule (100 mg total) by mouth 2 (two) times daily. 08/22/24  Yes Tonnia Bardin, DO  acetaminophen  (TYLENOL ) 650 MG CR tablet Take 650-1,300 mg by mouth every 8 (eight) hours as needed for pain.    [provider]  aspirin EC 81 MG tablet Take 81 mg by mouth daily.    [provider]  atorvastatin  (LIPITOR) 20 MG tablet Take 20 mg by mouth every evening.     [provider]  Cholecalciferol  (VITAMIN D -3) 5000 units TABS Take 5,000 Units by mouth daily.     [provider]  Coenzyme Q10 (COQ10) 200 MG CAPS Take 200 mg by mouth daily.     [provider]  docusate sodium  (COLACE)  100 MG capsule Take 1 capsule (100 mg total) by mouth 2 (two) times daily. 05/05/18   Mavis Purchase, MD  ferrous sulfate  325 (65 FE) MG tablet Take 325 mg by mouth every 3 (three) days.    [provider]  furosemide  (LASIX ) 20 MG tablet Take 20 mg by mouth daily.     [provider]  gabapentin  (NEURONTIN ) 300 MG capsule Take 300 mg by mouth 3 (three) times daily.    [provider]  levothyroxine  (SYNTHROID , LEVOTHROID) 75 MCG tablet Take 75 mcg by mouth daily before breakfast.    [provider]  losartan  (COZAAR ) 100 MG tablet Take 100 mg by mouth every evening.     [provider]  Multiple Vitamins-Minerals  (PRESERVISION/LUTEIN) CAPS Take 1 capsule by mouth daily.     [provider]  oxyCODONE  (OXY IR/ROXICODONE ) 5 MG immediate release tablet Take 1 tablet (5 mg total) by mouth every 4 (four) hours as needed for moderate pain ((score 4 to 6)). 05/05/18   Mavis Purchase, MD  PARoxetine  (PAXIL ) 10 MG tablet Take 10 mg by mouth every evening.     [provider]  Tetrahydrozoline HCl (VISINE OP) Place 2 drops into both eyes every evening.    [provider]  traMADol  (ULTRAM ) 50 MG tablet Take 50 mg by mouth every evening.     [provider]    Family History History reviewed. No pertinent family history.  Social History Social History   Tobacco Use   Smoking status: Never   Smokeless tobacco: Never  Vaping Use   Vaping status: Never Used  Substance Use Topics   Alcohol use: No   Drug use: No     Allergies   Pineapple, Pregabalin, Motrin [ibuprofen], Naproxen, and Tizanidine   Review of Systems Review of Systems: :negative unless otherwise stated in HPI.      Physical Exam Triage Vital Signs ED Triage Vitals  Encounter Vitals Group     BP --      Girls Systolic BP Percentile --      Girls Diastolic BP Percentile --      Boys Systolic BP Percentile --      Boys Diastolic BP Percentile --      Pulse Rate 08/22/24 0930 60     Resp 08/22/24 0930 15     Temp 08/22/24 0930 97.7 F (36.5 C)     Temp Source 08/22/24 0930 Oral     SpO2 08/22/24 0930 97 %     Weight 08/22/24 0929 164 lb 14.5 oz (74.8 kg)     Height 08/22/24 0929 5' 1 (1.549 m)     Head Circumference --      Peak Flow --      Pain Score 08/22/24 0929 0     Pain Loc --      Pain Education --      Exclude from Growth Chart --    No data found.  Updated Vital Signs BP 138/80 (BP Location: Left Arm)   Pulse 60   Temp 97.7 F (36.5 C) (Oral)   Resp 15   Ht 5' 1 (1.549 m)   Wt 74.8 kg   SpO2 97%   BMI 31.16 kg/m   Visual Acuity Right Eye Distance:   Left Eye  Distance:   Bilateral Distance:    Right Eye Near:   Left Eye Near:    Bilateral Near:     Physical Exam GEN: well appearing female  in no acute distress  CVS: well perfused  RESP: speaking in full sentences without pause  ABD: soft, tenderness to suprapubic, non-distended, no palpable masses, no CVA tenderness.   SKIN: warm, dry    UC Treatments / Results  Labs (all labs ordered are listed, but only abnormal results are displayed) Labs Reviewed  POCT URINE DIPSTICK - Abnormal; Notable for the following components:      Result Value   Clarity, UA cloudy (*)    Blood, UA small (*)    POC PROTEIN,UA =30 (*)    Leukocytes, UA Moderate (2+) (*)    All other components within normal limits  URINE CULTURE    EKG   Radiology No results found.  Procedures Procedures (including critical care time)  Medications Ordered in UC Medications - No data to display  Initial Impression / Assessment and Plan / UC Course  I have reviewed the triage vital signs and the nursing notes.  Pertinent labs & imaging results that were available during my care of the patient were reviewed by me and considered in my medical decision making (see chart for details).     Acute cystitis:  Patient is a 83 y.o. female  who presents for 6-7 days of dysuria and urinary frequency.  Overall patient is well-appearing and afebrile.  Vital signs stable.  Urine dipstick consistent with acute cystitis with small amount of blood.  Microscopy not available. Treat with Macrobid 2 times daily for 5 days. Serum creatine from April 2025 was normal.  Urine culture obtained.  Follow-up sensitivities and change antibiotics, if needed.   Return precautions including abdominal pain, fever, chills, nausea, or vomiting given. Follow-up,  if symptoms not improving or getting worse. Discussed MDM, treatment plan and plan for follow-up with patient and her husband who agree with plan.        Final Clinical Impressions(s)  / UC Diagnoses   Final diagnoses:  Urinary frequency  Cystitis     Discharge Instructions      You have a urinary tract infection. Be sure to drink plenty of water.  I sent your urine for culture to be sure the antibiotic prescribed will treat your infection. Someone may call you to change antibiotics. Stop by the pharmacy to pick up your prescriptions.  Follow up with your primary care provider or return to the urgent care, if not improving.      ED Prescriptions     Medication Sig Dispense Auth. Provider   nitrofurantoin, macrocrystal-monohydrate, (MACROBID) 100 MG capsule Take 1 capsule (100 mg total) by mouth 2 (two) times daily. 10 capsule Jaquavius Hudler, DO      PDMP not reviewed this encounter.   Salia Cangemi, DO 08/22/24 1125

## 2024-08-23 ENCOUNTER — Ambulatory Visit (HOSPITAL_COMMUNITY): Payer: Self-pay

## 2024-08-23 LAB — URINE CULTURE: Culture: <10000 — AB

## 2025-01-11 ENCOUNTER — Ambulatory Visit: Admitting: Physical Therapy

## 2025-01-13 ENCOUNTER — Ambulatory Visit: Admitting: Physical Therapy

## 2025-01-18 ENCOUNTER — Ambulatory Visit: Admitting: Physical Therapy

## 2025-01-20 ENCOUNTER — Ambulatory Visit: Admitting: Physical Therapy

## 2025-02-01 ENCOUNTER — Ambulatory Visit: Admitting: Physical Therapy

## 2025-02-03 ENCOUNTER — Ambulatory Visit: Admitting: Physical Therapy

## 2025-02-08 ENCOUNTER — Ambulatory Visit: Admitting: Physical Therapy

## 2025-02-10 ENCOUNTER — Ambulatory Visit: Admitting: Physical Therapy

## 2025-02-15 ENCOUNTER — Ambulatory Visit: Admitting: Physical Therapy

## 2025-02-22 ENCOUNTER — Ambulatory Visit: Admitting: Physical Therapy

## 2025-02-24 ENCOUNTER — Ambulatory Visit: Admitting: Physical Therapy

## 2025-03-01 ENCOUNTER — Ambulatory Visit: Admitting: Physical Therapy

## 2025-03-03 ENCOUNTER — Ambulatory Visit: Admitting: Physical Therapy
